# Patient Record
Sex: Male | Born: 1955
Health system: Southern US, Community
[De-identification: ages and names within clinical notes are randomized; demographics above are authoritative.]

## PROBLEM LIST (undated history)

## (undated) DIAGNOSIS — M109 Gout, unspecified: Secondary | ICD-10-CM

## (undated) DIAGNOSIS — G4730 Sleep apnea, unspecified: Secondary | ICD-10-CM

## (undated) DIAGNOSIS — F1011 Alcohol abuse, in remission: Secondary | ICD-10-CM

## (undated) DIAGNOSIS — F419 Anxiety disorder, unspecified: Secondary | ICD-10-CM

## (undated) DIAGNOSIS — M1990 Unspecified osteoarthritis, unspecified site: Secondary | ICD-10-CM

## (undated) DIAGNOSIS — I10 Essential (primary) hypertension: Secondary | ICD-10-CM

## (undated) DIAGNOSIS — N529 Male erectile dysfunction, unspecified: Secondary | ICD-10-CM

## (undated) DIAGNOSIS — K7460 Unspecified cirrhosis of liver: Secondary | ICD-10-CM

## (undated) DIAGNOSIS — E785 Hyperlipidemia, unspecified: Secondary | ICD-10-CM

## (undated) DIAGNOSIS — E119 Type 2 diabetes mellitus without complications: Secondary | ICD-10-CM

## (undated) DIAGNOSIS — F32A Depression, unspecified: Secondary | ICD-10-CM

## (undated) DIAGNOSIS — R112 Nausea with vomiting, unspecified: Secondary | ICD-10-CM

## (undated) DIAGNOSIS — I509 Heart failure, unspecified: Secondary | ICD-10-CM

## (undated) DIAGNOSIS — E781 Pure hyperglyceridemia: Secondary | ICD-10-CM

## (undated) DIAGNOSIS — Z9889 Other specified postprocedural states: Secondary | ICD-10-CM

## (undated) HISTORY — DX: Hyperlipidemia, unspecified: E78.5

## (undated) HISTORY — DX: Unspecified cirrhosis of liver: K74.60

## (undated) HISTORY — DX: Alcohol abuse, in remission: F10.11

## (undated) HISTORY — DX: Essential (primary) hypertension: I10

## (undated) HISTORY — DX: Pure hyperglyceridemia: E78.1

## (undated) HISTORY — PX: ELBOW SURGERY: SHX618

## (undated) HISTORY — DX: Male erectile dysfunction, unspecified: N52.9

## (undated) HISTORY — PX: CARPAL TUNNEL RELEASE: SHX101

## (undated) HISTORY — DX: Type 2 diabetes mellitus without complications: E11.9

## (undated) HISTORY — PX: NECK SURGERY: SHX720

---

## 2008-06-11 HISTORY — PX: COLONOSCOPY: SHX174

## 2008-11-26 ENCOUNTER — Ambulatory Visit: Payer: Self-pay | Admitting: Internal Medicine

## 2008-12-17 ENCOUNTER — Ambulatory Visit: Payer: Self-pay | Admitting: Internal Medicine

## 2011-07-19 ENCOUNTER — Other Ambulatory Visit: Payer: Self-pay | Admitting: Family Medicine

## 2011-07-19 ENCOUNTER — Ambulatory Visit (HOSPITAL_COMMUNITY)
Admission: RE | Admit: 2011-07-19 | Discharge: 2011-07-19 | Disposition: A | Discharge status: Home / Self Care | Payer: BC Managed Care – PPO | Source: Ambulatory Visit | Attending: Family Medicine | Admitting: Family Medicine

## 2011-07-19 DIAGNOSIS — M542 Cervicalgia: Secondary | ICD-10-CM | POA: Insufficient documentation

## 2011-07-19 DIAGNOSIS — M79609 Pain in unspecified limb: Secondary | ICD-10-CM | POA: Insufficient documentation

## 2011-07-19 DIAGNOSIS — R52 Pain, unspecified: Secondary | ICD-10-CM

## 2011-07-19 DIAGNOSIS — M5030 Other cervical disc degeneration, unspecified cervical region: Secondary | ICD-10-CM | POA: Insufficient documentation

## 2011-07-23 ENCOUNTER — Other Ambulatory Visit: Payer: Self-pay | Admitting: Family Medicine

## 2011-07-23 DIAGNOSIS — M542 Cervicalgia: Secondary | ICD-10-CM

## 2011-07-30 ENCOUNTER — Ambulatory Visit (HOSPITAL_COMMUNITY): Payer: BC Managed Care – PPO

## 2011-07-31 ENCOUNTER — Ambulatory Visit (HOSPITAL_COMMUNITY)
Admission: RE | Admit: 2011-07-31 | Discharge: 2011-07-31 | Disposition: A | Discharge status: Home / Self Care | Payer: BC Managed Care – PPO | Source: Ambulatory Visit | Attending: Family Medicine | Admitting: Family Medicine

## 2011-07-31 ENCOUNTER — Other Ambulatory Visit: Payer: Self-pay | Admitting: Family Medicine

## 2011-07-31 DIAGNOSIS — M5020 Other cervical disc displacement, unspecified cervical region: Secondary | ICD-10-CM | POA: Insufficient documentation

## 2011-07-31 DIAGNOSIS — M542 Cervicalgia: Secondary | ICD-10-CM

## 2011-07-31 DIAGNOSIS — M5380 Other specified dorsopathies, site unspecified: Secondary | ICD-10-CM | POA: Insufficient documentation

## 2011-07-31 DIAGNOSIS — R209 Unspecified disturbances of skin sensation: Secondary | ICD-10-CM | POA: Insufficient documentation

## 2011-07-31 MED ORDER — GADOBENATE DIMEGLUMINE 529 MG/ML IV SOLN
18.0000 mL | Freq: Once | INTRAVENOUS | Status: AC | PRN
  Administered 2011-07-31: 18 mL via INTRAVENOUS

## 2011-08-02 LAB — POCT I-STAT, CHEM 8
BUN: 21 mg/dL (ref 6–23)
Calcium, Ion: 1.26 mmol/L (ref 1.12–1.32)
Chloride: 106 mEq/L (ref 96–112)
Creatinine, Ser: 1.1 mg/dL (ref 0.50–1.35)
Glucose, Bld: 137 mg/dL — ABNORMAL HIGH (ref 70–99)
HCT: 39 % (ref 39.0–52.0)
Hemoglobin: 13.3 g/dL (ref 13.0–17.0)
Potassium: 4.2 mEq/L (ref 3.5–5.1)
Sodium: 141 mEq/L (ref 135–145)
TCO2: 28 mmol/L (ref 0–100)

## 2012-09-24 ENCOUNTER — Encounter: Payer: Self-pay | Admitting: *Deleted

## 2012-09-25 ENCOUNTER — Other Ambulatory Visit: Payer: Self-pay | Admitting: Family Medicine

## 2012-09-26 ENCOUNTER — Encounter: Payer: Self-pay | Admitting: Nurse Practitioner

## 2012-09-26 ENCOUNTER — Ambulatory Visit (INDEPENDENT_AMBULATORY_CARE_PROVIDER_SITE_OTHER): Payer: BC Managed Care – PPO | Admitting: Nurse Practitioner

## 2012-09-26 VITALS — BP 118/88 | HR 80 | Ht 68.0 in | Wt 201.3 lb

## 2012-09-26 DIAGNOSIS — I10 Essential (primary) hypertension: Secondary | ICD-10-CM

## 2012-09-26 DIAGNOSIS — E1169 Type 2 diabetes mellitus with other specified complication: Secondary | ICD-10-CM | POA: Insufficient documentation

## 2012-09-26 DIAGNOSIS — E119 Type 2 diabetes mellitus without complications: Secondary | ICD-10-CM

## 2012-09-26 DIAGNOSIS — Z79899 Other long term (current) drug therapy: Secondary | ICD-10-CM

## 2012-09-26 DIAGNOSIS — J310 Chronic rhinitis: Secondary | ICD-10-CM

## 2012-09-26 DIAGNOSIS — E781 Pure hyperglyceridemia: Secondary | ICD-10-CM

## 2012-09-26 DIAGNOSIS — E785 Hyperlipidemia, unspecified: Secondary | ICD-10-CM

## 2012-09-26 MED ORDER — ATORVASTATIN CALCIUM 20 MG PO TABS: 20.0000 mg | ORAL_TABLET | Freq: Every day | ORAL | Status: DC

## 2012-09-26 NOTE — Assessment & Plan Note (Signed)
stop pravastatin. Switch to atorvastatin 20 mg daily. Repeat lipid profile and liver profile in 3 months.

## 2012-09-26 NOTE — Assessment & Plan Note (Signed)
Prescription given for new glucose testing machine and supplies.  Continue monitoring blood sugar and bring log to next visit.

## 2012-09-26 NOTE — Progress Notes (Signed)
Subjective:  Presents for routine followup of his diabetes. Blood sugars are much improved running 119-165 fasting on average at home. Had recent complete physical done at work. Last hemoglobin A1c was around 7. Sugars vary depending on foods that he eats and times that he takes his medications. Has not had his blood pressure pill today. Occasional reflux depending on what he eats no more than 3 days per week. No abdominal pain. Smokes less than one pack per day. Drinks a fifth of liquor per week. No chest pain or shortness of breath. No edema. Overall eating a healthy diet. Active lifestyle. Occasional mild sore throat, no difficulty swallowing. No fever. No cough. Mild head congestion at times. Mainly sneezing and congestion when he works outside.  Objective:   BP 118/88  Pulse 80  Ht 5\' 8"  (1.727 m)  Wt 201 lb 4.8 oz (91.309 kg)  BMI 30.61 kg/m2 NAD. Alert, oriented. TMs minimal clear effusion, no erythema. Pharynx mild erythema, PND noted. Neck supple with mild soft nontender adenopathy. Lungs clear. Heart regular rate rhythm. BP on recheck 118/80. Lower extremities no edema. Lab work dated 08/29/2012 fasting glucose 110; total cholesterol 165, triglycerides 574 and HDL 33.  Assessment/Plan: #1 Other and unspecified hyperlipidemia - Plan: Lipid panel, Lipid panel  Encounter for long-term (current) use of other medications - Plan: Hepatic function panel, Hepatic function panel  Rhinitis  Hypertension  Hyperlipidemia  Diabetes mellitus without complication   Orders Placed This Encounter  Procedures  . Lipid panel  . Hepatic function panel   stop pravastatin. Switch to atorvastatin 20 mg daily. Repeat lipid profile and liver profile in 3 months. Prescription given for new glucose testing machine and supplies.  Continue monitoring blood sugar and bring log to next visit. Recheck in 3 months, sooner if any problems. OTC meds as directed for congestion. Discussed smoking cessation.

## 2012-11-22 ENCOUNTER — Other Ambulatory Visit: Payer: Self-pay | Admitting: Family Medicine

## 2012-11-30 ENCOUNTER — Other Ambulatory Visit: Payer: Self-pay | Admitting: Family Medicine

## 2012-12-18 ENCOUNTER — Telehealth: Payer: Self-pay | Admitting: Family Medicine

## 2012-12-18 MED ORDER — LISINOPRIL 5 MG PO TABS: 5.0000 mg | ORAL_TABLET | Freq: Every day | ORAL | Status: DC

## 2012-12-18 NOTE — Telephone Encounter (Signed)
Patient needs a refill of her lisinopril to [facility] in [place]

## 2012-12-18 NOTE — Telephone Encounter (Signed)
Rx sent electronically to [facility]. Patient notified.

## 2012-12-26 ENCOUNTER — Ambulatory Visit (INDEPENDENT_AMBULATORY_CARE_PROVIDER_SITE_OTHER): Payer: BC Managed Care – PPO | Admitting: Nurse Practitioner

## 2012-12-26 ENCOUNTER — Encounter: Payer: Self-pay | Admitting: Nurse Practitioner

## 2012-12-26 VITALS — BP 122/94 | HR 70 | Wt 201.6 lb

## 2012-12-26 DIAGNOSIS — Z79899 Other long term (current) drug therapy: Secondary | ICD-10-CM

## 2012-12-26 DIAGNOSIS — G4700 Insomnia, unspecified: Secondary | ICD-10-CM

## 2012-12-26 DIAGNOSIS — E119 Type 2 diabetes mellitus without complications: Secondary | ICD-10-CM

## 2012-12-26 DIAGNOSIS — E781 Pure hyperglyceridemia: Secondary | ICD-10-CM

## 2012-12-26 DIAGNOSIS — I10 Essential (primary) hypertension: Secondary | ICD-10-CM

## 2012-12-26 MED ORDER — ALPRAZOLAM 0.5 MG PO TABS: 0.5000 mg | ORAL_TABLET | Freq: Every evening | ORAL | Status: DC | PRN

## 2012-12-26 MED ORDER — ATORVASTATIN CALCIUM 20 MG PO TABS: 20.0000 mg | ORAL_TABLET | Freq: Every day | ORAL | Status: DC

## 2012-12-26 MED ORDER — HYDROCHLOROTHIAZIDE 25 MG PO TABS: ORAL_TABLET | ORAL | Status: DC

## 2012-12-26 MED ORDER — METFORMIN HCL 500 MG PO TABS: ORAL_TABLET | ORAL | Status: DC

## 2012-12-26 MED ORDER — LISINOPRIL 5 MG PO TABS: 5.0000 mg | ORAL_TABLET | Freq: Every day | ORAL | Status: DC

## 2012-12-26 NOTE — Progress Notes (Signed)
Did not go for labs; given new orders 12/26/12

## 2012-12-26 NOTE — Progress Notes (Signed)
Patient did not go for labs.  Given order for labs again on 12/26/12

## 2012-12-27 LAB — LIPID PANEL
Cholesterol: 129 mg/dL (ref 0–200)
HDL: 37 mg/dL — ABNORMAL LOW (ref >39)
LDL Cholesterol: 17 mg/dL (ref 0–99)
Total CHOL/HDL Ratio: 3.5 Ratio
Triglycerides: 374 mg/dL — ABNORMAL HIGH (ref <150)
VLDL: 75 mg/dL — ABNORMAL HIGH (ref 0–40)

## 2012-12-27 LAB — HEPATIC FUNCTION PANEL
ALT: 29 U/L (ref 0–53)
AST: 21 U/L (ref 0–37)
Albumin: 4.4 g/dL (ref 3.5–5.2)
Alkaline Phosphatase: 64 U/L (ref 39–117)
Bilirubin, Direct: 0.1 mg/dL (ref 0.0–0.3)
Indirect Bilirubin: 0.4 mg/dL (ref 0.0–0.9)
Total Bilirubin: 0.5 mg/dL (ref 0.3–1.2)
Total Protein: 6.7 g/dL (ref 6.0–8.3)

## 2012-12-27 LAB — HEMOGLOBIN A1C
Hgb A1c MFr Bld: 6.1 % — ABNORMAL HIGH (ref <5.7)
Mean Plasma Glucose: 128 mg/dL — ABNORMAL HIGH (ref <117)

## 2012-12-29 ENCOUNTER — Encounter: Payer: Self-pay | Admitting: Nurse Practitioner

## 2012-12-29 NOTE — Progress Notes (Signed)
Subjective:  Presents for routine followup. No chest pain/ischemic type pain or shortness of breath. No edema. Patient works out of town for [employer], has trouble sleeping especially in hotels. Occasionally will take a Xanax, several nights a week will drink alcohol. When asked, she drinks, he states "too much". Tries to avoid sugary drinks and simple carbs. Active lifestyle, no regular exercise. No interest in quitting smoking at this time.  Objective:   BP 122/94  Pulse 70  Wt 201 lb 9.6 oz (91.445 kg)  BMI 30.66 kg/m2 NAD. Alert, oriented. Lungs clear. Heart regular rate rhythm. Mild central obesity.  Assessment:Type 2 diabetes mellitus - Plan: Hemoglobin A1c, Hemoglobin A1c  Hypertriglyceridemia - Plan: Lipid panel, Lipid panel  High risk medication use - Plan: Hepatic function panel, Hepatic function panel  Insomnia  Hypertension   Plan: Meds ordered this encounter  Medications  . ALPRAZolam (XANAX) 0.5 MG tablet    Sig: Take 1 tablet (0.5 mg total) by mouth at bedtime as needed for sleep.    Dispense:  30 tablet    Refill:  5    Order Specific Question:  Supervising Provider    Answer:  Mikey Kirschner [[id number]]  . metFORMIN (GLUCOPHAGE) 500 MG tablet    Sig: TAKE 1 TABLET BY MOUTH TWICE DAILY    Dispense:  60 tablet    Refill:  5    Order Specific Question:  Supervising Provider    Answer:  Mikey Kirschner [[id number]]  . lisinopril (PRINIVIL,ZESTRIL) 5 MG tablet    Sig: Take 1 tablet (5 mg total) by mouth daily.    Dispense:  30 tablet    Refill:  5    Order Specific Question:  Supervising Provider    Answer:  Mikey Kirschner [[id number]]  . hydrochlorothiazide (HYDRODIURIL) 25 MG tablet    Sig: TAKE 1 TABLET BY MOUTH EVERY DAY    Dispense:  30 tablet    Refill:  5    Order Specific Question:  Supervising Provider    Answer:  Mikey Kirschner [[id number]]  . atorvastatin (LIPITOR) 20 MG tablet    Sig: Take 1 tablet (20 mg total) by mouth daily.    Dispense:  30  tablet    Refill:  5    Order Specific Question:  Supervising Provider    Answer:  Mikey Kirschner [[id number]]   encouraged patient to use low-dose Xanax as needed for sleep, limit alcohol intake to 2 affect on his blood sugar. Recheck in 4 months, call back sooner if any problems.

## 2013-01-05 ENCOUNTER — Encounter: Payer: Self-pay | Admitting: Nurse Practitioner

## 2013-01-08 ENCOUNTER — Encounter: Payer: Self-pay | Admitting: *Deleted

## 2013-01-08 ENCOUNTER — Other Ambulatory Visit: Payer: Self-pay | Admitting: *Deleted

## 2013-01-08 MED ORDER — ATORVASTATIN CALCIUM 40 MG PO TABS: 40.0000 mg | ORAL_TABLET | Freq: Every day | ORAL | Status: DC

## 2013-04-16 ENCOUNTER — Other Ambulatory Visit: Payer: Self-pay

## 2013-06-30 ENCOUNTER — Telehealth: Payer: Self-pay | Admitting: Family Medicine

## 2013-06-30 NOTE — Telephone Encounter (Signed)
Patient needs order for blood work. °

## 2013-07-01 ENCOUNTER — Other Ambulatory Visit: Payer: Self-pay | Admitting: Nurse Practitioner

## 2013-07-01 DIAGNOSIS — E781 Pure hyperglyceridemia: Secondary | ICD-10-CM

## 2013-07-01 DIAGNOSIS — Z79899 Other long term (current) drug therapy: Secondary | ICD-10-CM

## 2013-07-01 DIAGNOSIS — E119 Type 2 diabetes mellitus without complications: Secondary | ICD-10-CM

## 2013-07-01 DIAGNOSIS — E785 Hyperlipidemia, unspecified: Secondary | ICD-10-CM

## 2013-07-01 DIAGNOSIS — I10 Essential (primary) hypertension: Secondary | ICD-10-CM

## 2013-07-01 DIAGNOSIS — Z125 Encounter for screening for malignant neoplasm of prostate: Secondary | ICD-10-CM

## 2013-07-04 LAB — BASIC METABOLIC PANEL
BUN: 19 mg/dL (ref 6–23)
CO2: 29 mEq/L (ref 19–32)
Calcium: 9 mg/dL (ref 8.4–10.5)
Chloride: 103 mEq/L (ref 96–112)
Creat: 1.3 mg/dL (ref 0.50–1.35)
Glucose, Bld: 122 mg/dL — ABNORMAL HIGH (ref 70–99)
Potassium: 4.3 mEq/L (ref 3.5–5.3)
Sodium: 140 mEq/L (ref 135–145)

## 2013-07-04 LAB — LIPID PANEL
Cholesterol: 125 mg/dL (ref 0–200)
HDL: 44 mg/dL (ref >39)
LDL Cholesterol: 8 mg/dL (ref 0–99)
Total CHOL/HDL Ratio: 2.8 Ratio
Triglycerides: 365 mg/dL — ABNORMAL HIGH (ref <150)
VLDL: 73 mg/dL — ABNORMAL HIGH (ref 0–40)

## 2013-07-04 LAB — HEPATIC FUNCTION PANEL
ALT: 26 U/L (ref 0–53)
AST: 20 U/L (ref 0–37)
Albumin: 4.5 g/dL (ref 3.5–5.2)
Alkaline Phosphatase: 71 U/L (ref 39–117)
Bilirubin, Direct: 0.1 mg/dL (ref 0.0–0.3)
Indirect Bilirubin: 0.4 mg/dL (ref 0.0–0.9)
Total Bilirubin: 0.5 mg/dL (ref 0.3–1.2)
Total Protein: 7.4 g/dL (ref 6.0–8.3)

## 2013-07-04 LAB — HEMOGLOBIN A1C
Hgb A1c MFr Bld: 6.4 % — ABNORMAL HIGH (ref <5.7)
Mean Plasma Glucose: 137 mg/dL — ABNORMAL HIGH (ref <117)

## 2013-07-04 LAB — MICROALBUMIN, URINE: Microalb, Ur: 3.16 mg/dL — ABNORMAL HIGH (ref 0.00–1.89)

## 2013-07-05 LAB — PSA: PSA: 0.49 ng/mL (ref <=4.00)

## 2013-07-13 ENCOUNTER — Encounter: Payer: Self-pay | Admitting: Nurse Practitioner

## 2013-07-13 ENCOUNTER — Ambulatory Visit (INDEPENDENT_AMBULATORY_CARE_PROVIDER_SITE_OTHER): Payer: BC Managed Care – PPO | Admitting: Nurse Practitioner

## 2013-07-13 VITALS — BP 130/86 | Ht 66.5 in | Wt 198.4 lb

## 2013-07-13 DIAGNOSIS — E781 Pure hyperglyceridemia: Secondary | ICD-10-CM

## 2013-07-13 DIAGNOSIS — N529 Male erectile dysfunction, unspecified: Secondary | ICD-10-CM

## 2013-07-13 DIAGNOSIS — E119 Type 2 diabetes mellitus without complications: Secondary | ICD-10-CM

## 2013-07-13 DIAGNOSIS — I10 Essential (primary) hypertension: Secondary | ICD-10-CM

## 2013-07-13 MED ORDER — CYCLOBENZAPRINE HCL 10 MG PO TABS: 10.0000 mg | ORAL_TABLET | Freq: Three times a day (TID) | ORAL | Status: DC | PRN

## 2013-07-13 MED ORDER — HYDROCHLOROTHIAZIDE 25 MG PO TABS: ORAL_TABLET | ORAL | Status: DC

## 2013-07-13 MED ORDER — ALPRAZOLAM 0.5 MG PO TABS: 0.5000 mg | ORAL_TABLET | Freq: Every evening | ORAL | Status: DC | PRN

## 2013-07-13 MED ORDER — TADALAFIL 20 MG PO TABS: 20.0000 mg | ORAL_TABLET | ORAL | Status: DC | PRN

## 2013-07-13 NOTE — Progress Notes (Signed)
Subjective:  Presents for routine followup and to review his most recent blood work. Has not been taking his omega-3 on a regular basis. Has decreased his smoking from one pack per day to one pack per week. Using  vaporizer with minimal nicotine. Requesting a refill on his Cialis. Understands not to take medication with any nitroglycerin products. No chest pain shortness of breath or edema.  Objective:   BP 130/86  Ht 5' 6.5" (1.689 m)  Wt 198 lb 6.4 oz (89.994 kg)  BMI 31.55 kg/m2 NAD. Alert, oriented. Lungs clear. Heart regular rhythm. See lab work dated 1/24. Reviewed with patient. All of his labs rate are stable or improved, although triglycerides are much improved from previous levels, still as above 300.  Assessment:Diabetes mellitus without complication  Hypertriglyceridemia  Hypertension  Erectile dysfunction  Plan: Continue current medications as directed. Restart omega-3 and slowly titrate dose up as much as possible. Repeat lipid profile in 6 months.

## 2013-07-13 NOTE — Assessment & Plan Note (Signed)
Plan: Continue current medications as directed. Restart omega-3 and slowly titrate dose up as much as possible. Repeat lipid profile in 6 months.

## 2013-07-20 ENCOUNTER — Other Ambulatory Visit: Payer: Self-pay | Admitting: Nurse Practitioner

## 2013-08-31 ENCOUNTER — Other Ambulatory Visit: Payer: Self-pay | Admitting: Nurse Practitioner

## 2013-11-21 ENCOUNTER — Other Ambulatory Visit: Payer: Self-pay | Admitting: Family Medicine

## 2014-01-01 ENCOUNTER — Encounter: Payer: Self-pay | Admitting: Family Medicine

## 2014-01-01 ENCOUNTER — Ambulatory Visit (INDEPENDENT_AMBULATORY_CARE_PROVIDER_SITE_OTHER): Payer: BC Managed Care – PPO | Admitting: Family Medicine

## 2014-01-01 VITALS — BP 128/82 | Ht 67.0 in | Wt 203.0 lb

## 2014-01-01 DIAGNOSIS — E119 Type 2 diabetes mellitus without complications: Secondary | ICD-10-CM

## 2014-01-01 DIAGNOSIS — E785 Hyperlipidemia, unspecified: Secondary | ICD-10-CM

## 2014-01-01 DIAGNOSIS — Z125 Encounter for screening for malignant neoplasm of prostate: Secondary | ICD-10-CM

## 2014-01-01 DIAGNOSIS — E781 Pure hyperglyceridemia: Secondary | ICD-10-CM

## 2014-01-01 DIAGNOSIS — I10 Essential (primary) hypertension: Secondary | ICD-10-CM

## 2014-01-01 DIAGNOSIS — M109 Gout, unspecified: Secondary | ICD-10-CM

## 2014-01-01 MED ORDER — COLCHICINE 0.6 MG PO TABS: 0.6000 mg | ORAL_TABLET | Freq: Two times a day (BID) | ORAL | Status: DC

## 2014-01-01 NOTE — Progress Notes (Signed)
   Subjective:    Patient ID: Austin Morgan, male    DOB: May 23, 1956, 58 y.o.   MRN: [id number]  HPI  Patient arrives with flare of gout. Swollen right pointer finger and left big toe. Swelling and pain started last weekend. Has had this before. Eats a lot of red knee. I that in no and you do Review of Systems He relates pain discomfort denies any fever chills or sweats.    Objective:   Physical Exam  Patient has tenderness in the left into the joint. He also has tophi noted in the right index finger at the DIP joint. Lungs clear heart regular      Assessment & Plan:  Gout-hold off on statins for the next 10 days while on colchicine twice a day  Down the road we will talk about starting allopurinol or Uloric, he is coming back in August after doing his blood  Check lab work. May end up having to be on uric acid.

## 2014-01-04 ENCOUNTER — Other Ambulatory Visit: Payer: Self-pay | Admitting: Nurse Practitioner

## 2014-01-12 LAB — MICROALBUMIN, URINE: Microalb, Ur: 1.25 mg/dL (ref 0.00–1.89)

## 2014-01-12 LAB — BASIC METABOLIC PANEL
BUN: 22 mg/dL (ref 6–23)
CO2: 27 mEq/L (ref 19–32)
Calcium: 9 mg/dL (ref 8.4–10.5)
Chloride: 99 mEq/L (ref 96–112)
Creat: 1.35 mg/dL (ref 0.50–1.35)
Glucose, Bld: 144 mg/dL — ABNORMAL HIGH (ref 70–99)
Potassium: 4.2 mEq/L (ref 3.5–5.3)
Sodium: 138 mEq/L (ref 135–145)

## 2014-01-12 LAB — URIC ACID: Uric Acid, Serum: 10 mg/dL — ABNORMAL HIGH (ref 4.0–7.8)

## 2014-01-12 LAB — HEPATIC FUNCTION PANEL
ALT: 23 U/L (ref 0–53)
AST: 20 U/L (ref 0–37)
Albumin: 4.2 g/dL (ref 3.5–5.2)
Alkaline Phosphatase: 69 U/L (ref 39–117)
Bilirubin, Direct: 0.1 mg/dL (ref 0.0–0.3)
Indirect Bilirubin: 0.4 mg/dL (ref 0.2–1.2)
Total Bilirubin: 0.5 mg/dL (ref 0.2–1.2)
Total Protein: 6.6 g/dL (ref 6.0–8.3)

## 2014-01-12 LAB — HEMOGLOBIN A1C
Hgb A1c MFr Bld: 7 % — ABNORMAL HIGH (ref <5.7)
Mean Plasma Glucose: 154 mg/dL — ABNORMAL HIGH (ref <117)

## 2014-01-12 LAB — LIPID PANEL
Cholesterol: 222 mg/dL — ABNORMAL HIGH (ref 0–200)
HDL: 30 mg/dL — ABNORMAL LOW (ref >39)
Total CHOL/HDL Ratio: 7.4 Ratio
Triglycerides: 1413 mg/dL — ABNORMAL HIGH (ref <150)

## 2014-01-12 LAB — PSA: PSA: 0.87 ng/mL (ref <=4.00)

## 2014-01-13 ENCOUNTER — Other Ambulatory Visit: Payer: Self-pay | Admitting: Family Medicine

## 2014-01-15 ENCOUNTER — Ambulatory Visit (INDEPENDENT_AMBULATORY_CARE_PROVIDER_SITE_OTHER): Payer: BC Managed Care – PPO | Admitting: Nurse Practitioner

## 2014-01-15 ENCOUNTER — Encounter: Payer: Self-pay | Admitting: Nurse Practitioner

## 2014-01-15 VITALS — BP 136/92 | Ht 67.0 in | Wt 200.0 lb

## 2014-01-15 DIAGNOSIS — Z79899 Other long term (current) drug therapy: Secondary | ICD-10-CM

## 2014-01-15 DIAGNOSIS — M10041 Idiopathic gout, right hand: Secondary | ICD-10-CM

## 2014-01-15 DIAGNOSIS — E781 Pure hyperglyceridemia: Secondary | ICD-10-CM

## 2014-01-15 DIAGNOSIS — M109 Gout, unspecified: Secondary | ICD-10-CM

## 2014-01-15 MED ORDER — ALPRAZOLAM 0.5 MG PO TABS: 0.5000 mg | ORAL_TABLET | Freq: Every evening | ORAL | Status: DC | PRN

## 2014-01-15 MED ORDER — FENOFIBRATE 160 MG PO TABS: 160.0000 mg | ORAL_TABLET | Freq: Every day | ORAL | Status: DC

## 2014-01-15 MED ORDER — ALLOPURINOL 300 MG PO TABS: 300.0000 mg | ORAL_TABLET | Freq: Every day | ORAL | Status: DC

## 2014-01-15 NOTE — Patient Instructions (Addendum)
Hold on Lipitor  colchicine twice a day with allopurinol 2 weeks Stop colchicine and continue allopurinol Repeat labs (kidney function and uric acid) in 4 weeks If ok, then restart lipitor; add fenofibrate after stopping colchicine Recheck cholesterol and liver in 8-10 weeks  Low-Purine Diet Purines are compounds that affect the level of uric acid in your body. A low-purine diet is a diet that is low in purines. Eating a low-purine diet can prevent the level of uric acid in your body from getting too high and causing gout or kidney stones or both. WHAT DO I NEED TO KNOW ABOUT THIS DIET?  Choose low-purine foods. Examples of low-purine foods are listed in the next section.  Drink plenty of fluids, especially water. Fluids can help remove uric acid from your body. Try to drink 8-16 cups (1.9-3.8 L) a day.  Limit foods high in fat, especially saturated fat, as fat makes it harder for the body to get rid of uric acid. Foods high in saturated fat include pizza, cheese, ice cream, whole milk, fried foods, and gravies. Choose foods that are lower in fat and lean sources of protein. Use olive oil when cooking as it contains healthy fats that are not high in saturated fat.  Limit alcohol. Alcohol interferes with the elimination of uric acid from your body. If you are having a gout attack, avoid all alcohol.  Keep in mind that different people's bodies react differently to different foods. You will probably learn over time which foods do or do not affect you. If you discover that a food tends to cause your gout to flare up, avoid eating that food. You can more freely enjoy foods that do not cause problems. If you have any questions about a food item, talk to your dietitian or health care provider. WHICH FOODS ARE LOW, MODERATE, AND HIGH IN PURINES? The following is a list of foods that are low, moderate, and high in purines. You can eat any amount of the foods that are low in purines. You may be able to  have small amounts of foods that are moderate in purines. Ask your health care provider how much of a food moderate in purines you can have. Avoid foods high in purines. Grains  Foods low in purines: Enriched white bread, pasta, rice, cake, cornbread, popcorn.  Foods moderate in purines: Whole-grain breads and cereals, wheat germ, bran, oatmeal. Uncooked oatmeal. Dry wheat bran or wheat germ.  Foods high in purines: Pancakes, [place] toast, biscuits, muffins. Vegetables  Foods low in purines: All vegetables, except those that are moderate in purines.  Foods moderate in purines: Asparagus, cauliflower, spinach, mushrooms, green peas. Fruits  All fruits are low in purines. Meats and other Protein Foods  Foods low in purines: Eggs, nuts, peanut butter.  Foods moderate in purines: 80-90% lean beef, lamb, veal, pork, poultry, fish, eggs, peanut butter, nuts. Crab, lobster, oysters, and shrimp. Cooked dried beans, peas, and lentils.  Foods high in purines: Anchovies, sardines, herring, mussels, tuna, codfish, scallops, trout, and haddock. Berniece Salines. Organ meats (such as liver or kidney). Tripe. Game meat. Goose. Sweetbreads. Dairy  All dairy foods are low in purines. Low-fat and fat-free dairy products are best because they are low in saturated fat. Beverages  Drinks low in purines: Water, carbonated beverages, tea, coffee, cocoa.  Drinks moderate in purines: Soft drinks and other drinks sweetened with high-fructose corn syrup. Juices. To find whether a food or drink is sweetened with high-fructose corn syrup, look at the  ingredients list.  Drinks high in purines: Alcoholic beverages (such as beer). Condiments  Foods low in purines: Salt, herbs, olives, pickles, relishes, vinegar.  Foods moderate in purines: Butter, margarine, oils, mayonnaise. Fats and Oils  Foods low in purines: All types, except gravies and sauces made with meat.  Foods high in purines: Gravies and sauces made with  meat. Other Foods  Foods low in purines: Sugars, sweets, gelatin. Cake. Soups made without meat.  Foods moderate in purines: Meat-based or fish-based soups, broths, or bouillons. Foods and drinks sweetened with high-fructose corn syrup.  Foods high in purines: High-fat desserts (such as ice cream, cookies, cakes, pies, doughnuts, and chocolate). Contact your dietitian for more information on foods that are not listed here. Document Released: 09/22/2010 Document Revised: 06/02/2013 Document Reviewed: 05/04/2013 [facility] Patient Information 2015 [place], [place]. This information is not intended to replace advice given to you by your health care provider. Make sure you discuss any questions you have with your health care provider.

## 2014-01-19 ENCOUNTER — Encounter: Payer: Self-pay | Admitting: Nurse Practitioner

## 2014-01-19 DIAGNOSIS — M109 Gout, unspecified: Secondary | ICD-10-CM | POA: Insufficient documentation

## 2014-01-19 MED ORDER — SILDENAFIL CITRATE 50 MG PO TABS: 50.0000 mg | ORAL_TABLET | Freq: Every day | ORAL | Status: DC | PRN

## 2014-01-19 NOTE — Progress Notes (Signed)
Subjective:  Presents to discuss recent labs. Continues to have significant gout in his right ring finger. Nontender. Gout in his toe has resolved. Asked his wife to drain area; yellow stringy material, no purulent material. No fever. Cut out his alcohol for a few weeks and increased activity, but resumed alcohol when he developed gout in finger. Still on colchicine. Off lipitor while on this med. No CP/ischemic type pain or SOB. No abd pain. No nausea, vomiting.  Objective:   BP 136/92  Ht 5\' 7"  (1.702 m)  Wt 200 lb (90.719 kg)  BMI 31.32 kg/m2 NAD. Alert, oriented. Lungs clear. Heart RRR. abd soft, non distended, non tender. Significant edema and erythema noted at distal joint right ring finger, minimal tenderness to palpation. See labs 8/3. TG 1413 (365 last visit); hgbA1C 7.0 (6.4 last time). Reviewed labs with patient.   Assessment:  Problem List Items Addressed This Visit     Other   Hypertriglyceridemia - Primary (Chronic)   Relevant Medications      fenofibrate tablet   Other Relevant Orders      Basic metabolic panel      Lipid panel    Other Visit Diagnoses   Idiopathic gout of right hand, unspecified chronicity        Relevant Orders       Uric acid    Encounter for long-term (current) use of other medications        Relevant Orders       Hepatic function panel      Consulted with Dr. Nicki Reaper. Given written and verbal instructions.  Hold on Lipitor  colchicine twice a day with allopurinol 2 weeks Stop colchicine and continue allopurinol Repeat labs (kidney function and uric acid) in 4 weeks If ok, then restart lipitor; add fenofibrate after stopping colchicine Recheck cholesterol and liver in 8-10 weeks Given copy of low purine diet.  Return in about 3 months (around 04/17/2014). Call sooner if any problems.  At end of visit, patient requested to switch to Viagra. Cialis was too expensive,

## 2014-01-21 ENCOUNTER — Other Ambulatory Visit: Payer: Self-pay | Admitting: Family Medicine

## 2014-02-12 ENCOUNTER — Telehealth: Payer: Self-pay | Admitting: Family Medicine

## 2014-02-12 DIAGNOSIS — IMO0001 Reserved for inherently not codable concepts without codable children: Secondary | ICD-10-CM

## 2014-02-12 NOTE — Telephone Encounter (Signed)
Please add a CK for myalgias just to be safe

## 2014-02-12 NOTE — Telephone Encounter (Signed)
Ever since patient started taking fenofibrate in addition to his atorvastatin, he has been having muscle aches in his legs. The pain is not terrible, but present. Austin Morgan wants to know if Austin Morgan may want to add anything on his lab work that he is having done tomorrow to check for anything regarding his muscles.

## 2014-02-12 NOTE — Telephone Encounter (Signed)
Blood work order placed in [facility]. Patient notified.

## 2014-02-28 ENCOUNTER — Other Ambulatory Visit: Payer: Self-pay | Admitting: Family Medicine

## 2014-03-01 LAB — CK: Total CK: 51 U/L (ref 7–232)

## 2014-03-06 ENCOUNTER — Other Ambulatory Visit: Payer: Self-pay | Admitting: Family Medicine

## 2014-04-05 ENCOUNTER — Other Ambulatory Visit: Payer: Self-pay | Admitting: *Deleted

## 2014-04-05 ENCOUNTER — Other Ambulatory Visit: Payer: Self-pay | Admitting: Family Medicine

## 2014-04-05 ENCOUNTER — Telehealth: Payer: Self-pay | Admitting: Family Medicine

## 2014-04-05 MED ORDER — LISINOPRIL 5 MG PO TABS: ORAL_TABLET | ORAL | Status: DC

## 2014-04-05 MED ORDER — METFORMIN HCL 500 MG PO TABS: ORAL_TABLET | ORAL | Status: DC

## 2014-04-05 MED ORDER — HYDROCHLOROTHIAZIDE 25 MG PO TABS: ORAL_TABLET | ORAL | Status: DC

## 2014-04-05 NOTE — Telephone Encounter (Signed)
meds sent to pharm. Pt notified.  

## 2014-04-05 NOTE — Telephone Encounter (Signed)
Patient needs refills on metformin 500mg ,lisinopril 5mg , hydrochlorothiazide 25mg  called into [facility] [place] and states he needs them by 12 noon leaving to go out of town for a month.

## 2014-04-29 ENCOUNTER — Other Ambulatory Visit: Payer: Self-pay | Admitting: Nurse Practitioner

## 2014-04-30 ENCOUNTER — Encounter: Payer: Self-pay | Admitting: Nurse Practitioner

## 2014-04-30 ENCOUNTER — Ambulatory Visit (INDEPENDENT_AMBULATORY_CARE_PROVIDER_SITE_OTHER): Payer: BC Managed Care – PPO | Admitting: Nurse Practitioner

## 2014-04-30 VITALS — BP 130/84 | Ht 67.0 in | Wt 205.5 lb

## 2014-04-30 DIAGNOSIS — M1A00X1 Idiopathic chronic gout, unspecified site, with tophus (tophi): Secondary | ICD-10-CM

## 2014-04-30 MED ORDER — ALLOPURINOL 300 MG PO TABS: 300.0000 mg | ORAL_TABLET | Freq: Two times a day (BID) | ORAL | Status: DC

## 2014-05-05 ENCOUNTER — Encounter: Payer: Self-pay | Admitting: Nurse Practitioner

## 2014-05-05 NOTE — Progress Notes (Signed)
Subjective:  Presents for follow up of gout. Taking colchicine about every 3 weeks due to gout flare up. Stops his statin during that time due to potential interactions. Had a flare up recently podagra left foot which has resolved. Area on right finger much better.   Objective:   BP 130/84 mmHg  Ht 5\' 7"  (1.702 m)  Wt 205 lb 8 oz (93.214 kg)  BMI 32.18 kg/m2 NAD. Alert, oriented. Edema on right finger much improved. Lungs clear. Heart RRR.   Assessment: Idiopathic chronic gout with tophus, unspecified site  Plan:  Meds ordered this encounter  Medications  . allopurinol (ZYLOPRIM) 300 MG tablet    Sig: Take 1 tablet (300 mg total) by mouth 2 (two) times daily.    Dispense:  60 tablet    Refill:  5    Order Specific Question:  Supervising Provider    Answer:  Mikey Kirschner [[id number]]   Increase allopurinol to BID. Our goal is to keep him off of colchicine as much as possible so he can take statin. Obtain labs as previously recommended about a week before physical. Return in about 2 months (around 06/30/2014) for physical. Call back sooner if problems.

## 2014-06-14 ENCOUNTER — Telehealth: Payer: Self-pay | Admitting: Family Medicine

## 2014-06-14 DIAGNOSIS — I10 Essential (primary) hypertension: Secondary | ICD-10-CM

## 2014-06-14 DIAGNOSIS — M109 Gout, unspecified: Secondary | ICD-10-CM

## 2014-06-14 DIAGNOSIS — R5383 Other fatigue: Secondary | ICD-10-CM

## 2014-06-14 DIAGNOSIS — Z79899 Other long term (current) drug therapy: Secondary | ICD-10-CM

## 2014-06-14 DIAGNOSIS — E781 Pure hyperglyceridemia: Secondary | ICD-10-CM

## 2014-06-14 DIAGNOSIS — E119 Type 2 diabetes mellitus without complications: Secondary | ICD-10-CM

## 2014-06-14 NOTE — Telephone Encounter (Signed)
Pt is requesting labs for his physical on 06/21/2014. Last labs were: PSA 8/3                           A1C 8/3                           MicroAlbumin 8/3                           CK 9/21                           Lipid Panel 8/3                           Hepatic Function 8/7                           Uric Acid 8/7                           BMP 8/7

## 2014-06-14 NOTE — Telephone Encounter (Signed)
Lipid, liver, hemoglobin [id number], metabolic 7, CBC, uric acid. PSA is not indicated on this particular visit.

## 2014-06-15 NOTE — Telephone Encounter (Signed)
Patient notified and verbalized understanding. 

## 2014-06-19 LAB — CBC WITH DIFFERENTIAL/PLATELET
Basophils Absolute: 0 10*3/uL (ref 0.0–0.1)
Basophils Relative: 0 % (ref 0–1)
Eosinophils Absolute: 0.3 10*3/uL (ref 0.0–0.7)
Eosinophils Relative: 5 % (ref 0–5)
HCT: 38.4 % — ABNORMAL LOW (ref 39.0–52.0)
Hemoglobin: 12.9 g/dL — ABNORMAL LOW (ref 13.0–17.0)
Lymphocytes Relative: 23 % (ref 12–46)
Lymphs Abs: 1.4 10*3/uL (ref 0.7–4.0)
MCH: 32.3 pg (ref 26.0–34.0)
MCHC: 33.6 g/dL (ref 30.0–36.0)
MCV: 96.2 fL (ref 78.0–100.0)
MPV: 9.9 fL (ref 8.6–12.4)
Monocytes Absolute: 0.7 10*3/uL (ref 0.1–1.0)
Monocytes Relative: 11 % (ref 3–12)
Neutro Abs: 3.8 10*3/uL (ref 1.7–7.7)
Neutrophils Relative %: 61 % (ref 43–77)
Platelets: 224 10*3/uL (ref 150–400)
RBC: 3.99 MIL/uL — ABNORMAL LOW (ref 4.22–5.81)
RDW: 13.8 % (ref 11.5–15.5)
WBC: 6.2 10*3/uL (ref 4.0–10.5)

## 2014-06-20 LAB — HEPATIC FUNCTION PANEL
ALT: 31 U/L (ref 0–53)
AST: 18 U/L (ref 0–37)
Albumin: 4.1 g/dL (ref 3.5–5.2)
Alkaline Phosphatase: 60 U/L (ref 39–117)
Bilirubin, Direct: 0.1 mg/dL (ref 0.0–0.3)
Indirect Bilirubin: 0.6 mg/dL (ref 0.2–1.2)
Total Bilirubin: 0.7 mg/dL (ref 0.2–1.2)
Total Protein: 6.7 g/dL (ref 6.0–8.3)

## 2014-06-20 LAB — BASIC METABOLIC PANEL
BUN: 17 mg/dL (ref 6–23)
CO2: 28 mEq/L (ref 19–32)
Calcium: 9 mg/dL (ref 8.4–10.5)
Chloride: 100 mEq/L (ref 96–112)
Creat: 1.4 mg/dL — ABNORMAL HIGH (ref 0.50–1.35)
Glucose, Bld: 188 mg/dL — ABNORMAL HIGH (ref 70–99)
Potassium: 3.7 mEq/L (ref 3.5–5.3)
Sodium: 137 mEq/L (ref 135–145)

## 2014-06-20 LAB — LIPID PANEL
Cholesterol: 125 mg/dL (ref 0–200)
HDL: 31 mg/dL — ABNORMAL LOW
Total CHOL/HDL Ratio: 4 ratio
Triglycerides: 519 mg/dL — ABNORMAL HIGH

## 2014-06-20 LAB — HEMOGLOBIN A1C
Hgb A1c MFr Bld: 7.9 % — ABNORMAL HIGH
Mean Plasma Glucose: 180 mg/dL — ABNORMAL HIGH

## 2014-06-20 LAB — URIC ACID: Uric Acid, Serum: 3.3 mg/dL — ABNORMAL LOW (ref 4.0–7.8)

## 2014-06-21 ENCOUNTER — Telehealth: Payer: Self-pay | Admitting: *Deleted

## 2014-06-21 ENCOUNTER — Ambulatory Visit (INDEPENDENT_AMBULATORY_CARE_PROVIDER_SITE_OTHER): Payer: BLUE CROSS/BLUE SHIELD | Admitting: Family Medicine

## 2014-06-21 ENCOUNTER — Encounter: Payer: Self-pay | Admitting: Family Medicine

## 2014-06-21 VITALS — BP 128/82 | HR 88 | Ht 67.0 in | Wt 205.0 lb

## 2014-06-21 DIAGNOSIS — E119 Type 2 diabetes mellitus without complications: Secondary | ICD-10-CM

## 2014-06-21 DIAGNOSIS — Z23 Encounter for immunization: Secondary | ICD-10-CM

## 2014-06-21 DIAGNOSIS — E785 Hyperlipidemia, unspecified: Secondary | ICD-10-CM

## 2014-06-21 DIAGNOSIS — E784 Other hyperlipidemia: Secondary | ICD-10-CM

## 2014-06-21 DIAGNOSIS — E781 Pure hyperglyceridemia: Secondary | ICD-10-CM

## 2014-06-21 DIAGNOSIS — I10 Essential (primary) hypertension: Secondary | ICD-10-CM

## 2014-06-21 DIAGNOSIS — Z0000 Encounter for general adult medical examination without abnormal findings: Secondary | ICD-10-CM

## 2014-06-21 DIAGNOSIS — M109 Gout, unspecified: Secondary | ICD-10-CM

## 2014-06-21 DIAGNOSIS — D6489 Other specified anemias: Secondary | ICD-10-CM

## 2014-06-21 MED ORDER — ATORVASTATIN CALCIUM 40 MG PO TABS: 40.0000 mg | ORAL_TABLET | Freq: Every day | ORAL | Status: DC

## 2014-06-21 MED ORDER — COLCHICINE 0.6 MG PO TABS: 0.6000 mg | ORAL_TABLET | Freq: Two times a day (BID) | ORAL | Status: DC

## 2014-06-21 MED ORDER — GLIPIZIDE 5 MG PO TABS: 2.5000 mg | ORAL_TABLET | Freq: Two times a day (BID) | ORAL | Status: DC

## 2014-06-21 NOTE — Patient Instructions (Signed)
Smoking Cessation Quitting smoking is important to your health and has many advantages. However, it is not always easy to quit since nicotine is a very addictive drug. Oftentimes, people try 3 times or more before being able to quit. This document explains the best ways for you to prepare to quit smoking. Quitting takes hard work and a lot of effort, but you can do it. ADVANTAGES OF QUITTING SMOKING  You will live longer, feel better, and live better.  Your body will feel the impact of quitting smoking almost immediately.  Within 20 minutes, blood pressure decreases. Your pulse returns to its normal level.  After 8 hours, carbon monoxide levels in the blood return to normal. Your oxygen level increases.  After 24 hours, the chance of having a heart attack starts to decrease. Your breath, hair, and body stop smelling like smoke.  After 48 hours, damaged nerve endings begin to recover. Your sense of taste and smell improve.  After 72 hours, the body is virtually free of nicotine. Your bronchial tubes relax and breathing becomes easier.  After 2 to 12 weeks, lungs can hold more air. Exercise becomes easier and circulation improves.  The risk of having a heart attack, stroke, cancer, or lung disease is greatly reduced.  After 1 year, the risk of coronary heart disease is cut in half.  After 5 years, the risk of stroke falls to the same as a nonsmoker.  After 10 years, the risk of lung cancer is cut in half and the risk of other cancers decreases significantly.  After 15 years, the risk of coronary heart disease drops, usually to the level of a nonsmoker.  If you are pregnant, quitting smoking will improve your chances of having a healthy baby.  The people you live with, especially any children, will be healthier.  You will have extra money to spend on things other than cigarettes. QUESTIONS TO THINK ABOUT BEFORE ATTEMPTING TO QUIT You may want to talk about your answers with your  health care provider.  Why do you want to quit?  If you tried to quit in the past, what helped and what did not?  What will be the most difficult situations for you after you quit? How will you plan to handle them?  Who can help you through the tough times? Your family? Friends? A health care provider?  What pleasures do you get from smoking? What ways can you still get pleasure if you quit? Here are some questions to ask your health care provider:  How can you help me to be successful at quitting?  What medicine do you think would be best for me and how should I take it?  What should I do if I need more help?  What is smoking withdrawal like? How can I get information on withdrawal? GET READY  Set a quit date.  Change your environment by getting rid of all cigarettes, ashtrays, matches, and lighters in your home, car, or work. Do not let people smoke in your home.  Review your past attempts to quit. Think about what worked and what did not. GET SUPPORT AND ENCOURAGEMENT You have a better chance of being successful if you have help. You can get support in many ways.  Tell your family, friends, and coworkers that you are going to quit and need their support. Ask them not to smoke around you.  Get individual, group, or telephone counseling and support. Programs are available at local hospitals and health centers. Call   your local health department for information about programs in your area.  Spiritual beliefs and practices may help some smokers quit.  Download a "quit meter" on your computer to keep track of quit statistics, such as how long you have gone without smoking, cigarettes not smoked, and money saved.  Get a self-help book about quitting smoking and staying off tobacco. LEARN NEW SKILLS AND BEHAVIORS  Distract yourself from urges to smoke. Talk to someone, go for a walk, or occupy your time with a task.  Change your normal routine. Take a different route to work.  Drink tea instead of coffee. Eat breakfast in a different place.  Reduce your stress. Take a hot bath, exercise, or read a book.  Plan something enjoyable to do every day. Reward yourself for not smoking.  Explore interactive web-based programs that specialize in helping you quit. GET MEDICINE AND USE IT CORRECTLY Medicines can help you stop smoking and decrease the urge to smoke. Combining medicine with the above behavioral methods and support can greatly increase your chances of successfully quitting smoking.  Nicotine replacement therapy helps deliver nicotine to your body without the negative effects and risks of smoking. Nicotine replacement therapy includes nicotine gum, lozenges, inhalers, nasal sprays, and skin patches. Some may be available over-the-counter and others require a prescription.  Antidepressant medicine helps people abstain from smoking, but how this works is unknown. This medicine is available by prescription.  Nicotinic receptor partial agonist medicine simulates the effect of nicotine in your brain. This medicine is available by prescription. Ask your health care provider for advice about which medicines to use and how to use them based on your health history. Your health care provider will tell you what side effects to look out for if you choose to be on a medicine or therapy. Carefully read the information on the package. Do not use any other product containing nicotine while using a nicotine replacement product.  RELAPSE OR DIFFICULT SITUATIONS Most relapses occur within the first 3 months after quitting. Do not be discouraged if you start smoking again. Remember, most people try several times before finally quitting. You may have symptoms of withdrawal because your body is used to nicotine. You may crave cigarettes, be irritable, feel very hungry, cough often, get headaches, or have difficulty concentrating. The withdrawal symptoms are only temporary. They are strongest  when you first quit, but they will go away within 10-14 days. To reduce the chances of relapse, try to:  Avoid drinking alcohol. Drinking lowers your chances of successfully quitting.  Reduce the amount of caffeine you consume. Once you quit smoking, the amount of caffeine in your body increases and can give you symptoms, such as a rapid heartbeat, sweating, and anxiety.  Avoid smokers because they can make you want to smoke.  Do not let weight gain distract you. Many smokers will gain weight when they quit, usually less than 10 pounds. Eat a healthy diet and stay active. You can always lose the weight gained after you quit.  Find ways to improve your mood other than smoking. FOR MORE INFORMATION  www.smokefree.gov  Document Released: 05/22/2001 Document Revised: 10/12/2013 Document Reviewed: 09/06/2011 [employer] Patient Information 2015 [employer]. This information is not intended to replace advice given to you by your health care provider. Make sure you discuss any questions you have with your health care provider.  

## 2014-06-21 NOTE — Telephone Encounter (Signed)
Patient notified and verbalized understanding. Mailed out the cards.

## 2014-06-21 NOTE — Progress Notes (Signed)
   Subjective:    Patient ID: Austin Morgan, male    DOB: [birth year], 59 y.o.   MRN: [id number]  HPI The patient comes in today for a wellness visit.  A review of their health history was completed.   A review of medications was also completed.  Any needed refills: Yes, would like refills on his meds for 6 months.   Eating habits: Health conscious  Falls/  MVA accidents in past few months: No  Regular exercise: Physically active at work  Specialist pt sees on regular basis: No  Preventative health issues were discussed.   Additional concerns: Smoker  Talked at length about his cholesterol issues he is taking medication he understands he needs to keep this under good control to reduce risk of heart disease Talked about smoking the importance of quitting smoking to help lessen the risk of heart attacks and strokes and the importance of taking 81 mg aspirin Talked at length about diabetes cutting back on starches watching portions try to bring weight down Talked about gout medications as well as watching diet. Dietary information was printed and given to the patient  Review of Systems  Constitutional: Negative for activity change, appetite change and fatigue.  HENT: Negative for congestion.   Respiratory: Negative for cough.   Cardiovascular: Negative for chest pain.  Gastrointestinal: Negative for abdominal pain.  Endocrine: Negative for polydipsia and polyphagia.  Neurological: Negative for weakness.  Psychiatric/Behavioral: Negative for confusion.       Objective:   Physical Exam  Constitutional: He appears well-developed and well-nourished.  HENT:  Head: Normocephalic and atraumatic.  Right Ear: External ear normal.  Left Ear: External ear normal.  Nose: Nose normal.  Mouth/Throat: Oropharynx is clear and moist.  Eyes: EOM are normal. Pupils are equal, round, and reactive to light.  Neck: Normal range of motion. Neck supple. No thyromegaly present.  Cardiovascular:  Normal rate, regular rhythm and normal heart sounds.   No murmur heard. Pulmonary/Chest: Effort normal and breath sounds normal. No respiratory distress. He has no wheezes.  Abdominal: Soft. Bowel sounds are normal. He exhibits no distension and no mass. There is no tenderness.  Genitourinary: Penis normal.  Musculoskeletal: Normal range of motion. He exhibits no edema.  Lymphadenopathy:    He has no cervical adenopathy.  Neurological: He is alert. He exhibits normal muscle tone.  Skin: Skin is warm and dry. No erythema.  Psychiatric: He has a normal mood and affect. His behavior is normal. Judgment normal.          Assessment & Plan:  #1 wellness-safety measures dietary measures discussed. Importance of watching diet losing some weight discussed importance of quitting smoking was discussed patient not committed to quitting currently. Patient also encouraged to get pneumonia vaccine. He will get shingles vaccine by age 2. He is up-to-date on his colonoscopy.  #2 hypertriglyceridemia continue medication there is some improvement but I feel he can get his diabetes room better this actually would do better as well  #3 HTN blood pressure overall good watch diet try to quit smoking stay away from salt  #4 diabetes subpar control add glipizide 5 mg half in the morning half at suppertime. If low sugar spells notifies do not skip meals goal is to see A1c improved dietary measures discussed at length continue other medication  #5 it was necessary to cover wellness and sickness visit that the same visit therefore 2 separate codes

## 2014-06-21 NOTE — Telephone Encounter (Signed)
-----   Message from Kathyrn Drown, MD sent at 06/21/2014 12:45 PM EST ----- Please issue the patient Hemoccult cards 3 I forgot to do that for his anemia. The patient may pick these up and do them over the course of the next few weeks. Thank you

## 2014-07-06 ENCOUNTER — Other Ambulatory Visit: Payer: Self-pay

## 2014-07-06 MED ORDER — METFORMIN HCL 500 MG PO TABS: ORAL_TABLET | ORAL | Status: DC

## 2014-07-16 ENCOUNTER — Other Ambulatory Visit: Payer: Self-pay | Admitting: Family Medicine

## 2014-08-04 ENCOUNTER — Other Ambulatory Visit: Payer: Self-pay | Admitting: *Deleted

## 2014-08-04 MED ORDER — CYCLOBENZAPRINE HCL 10 MG PO TABS: 10.0000 mg | ORAL_TABLET | Freq: Three times a day (TID) | ORAL | Status: DC | PRN

## 2014-08-06 ENCOUNTER — Other Ambulatory Visit: Payer: Self-pay | Admitting: Family Medicine

## 2014-09-03 ENCOUNTER — Other Ambulatory Visit: Payer: Self-pay | Admitting: Family Medicine

## 2014-09-17 ENCOUNTER — Ambulatory Visit (INDEPENDENT_AMBULATORY_CARE_PROVIDER_SITE_OTHER): Payer: BLUE CROSS/BLUE SHIELD | Admitting: Family Medicine

## 2014-09-17 ENCOUNTER — Encounter: Payer: Self-pay | Admitting: Family Medicine

## 2014-09-17 VITALS — BP 118/82 | Ht 67.0 in | Wt 193.0 lb

## 2014-09-17 DIAGNOSIS — M109 Gout, unspecified: Secondary | ICD-10-CM | POA: Diagnosis not present

## 2014-09-17 DIAGNOSIS — E781 Pure hyperglyceridemia: Secondary | ICD-10-CM | POA: Diagnosis not present

## 2014-09-17 DIAGNOSIS — Z79899 Other long term (current) drug therapy: Secondary | ICD-10-CM

## 2014-09-17 DIAGNOSIS — E119 Type 2 diabetes mellitus without complications: Secondary | ICD-10-CM

## 2014-09-17 DIAGNOSIS — I10 Essential (primary) hypertension: Secondary | ICD-10-CM | POA: Diagnosis not present

## 2014-09-17 LAB — POCT GLYCOSYLATED HEMOGLOBIN (HGB A1C): Hemoglobin A1C: 5.5

## 2014-09-17 MED ORDER — ALLOPURINOL 300 MG PO TABS: 300.0000 mg | ORAL_TABLET | Freq: Two times a day (BID) | ORAL | Status: DC

## 2014-09-17 MED ORDER — HYDROCHLOROTHIAZIDE 25 MG PO TABS: 25.0000 mg | ORAL_TABLET | Freq: Every day | ORAL | Status: DC

## 2014-09-17 MED ORDER — LISINOPRIL 5 MG PO TABS: 5.0000 mg | ORAL_TABLET | Freq: Every day | ORAL | Status: DC

## 2014-09-17 MED ORDER — ATORVASTATIN CALCIUM 40 MG PO TABS: 40.0000 mg | ORAL_TABLET | Freq: Every day | ORAL | Status: DC

## 2014-09-17 MED ORDER — METFORMIN HCL 500 MG PO TABS: ORAL_TABLET | ORAL | Status: DC

## 2014-09-17 MED ORDER — COLCHICINE 0.6 MG PO TABS: 0.6000 mg | ORAL_TABLET | Freq: Two times a day (BID) | ORAL | Status: DC

## 2014-09-17 MED ORDER — ALPRAZOLAM 0.5 MG PO TABS: 0.5000 mg | ORAL_TABLET | Freq: Every evening | ORAL | Status: DC | PRN

## 2014-09-17 MED ORDER — SILDENAFIL CITRATE 50 MG PO TABS: 50.0000 mg | ORAL_TABLET | Freq: Every day | ORAL | Status: DC | PRN

## 2014-09-17 MED ORDER — GLIPIZIDE 5 MG PO TABS: 2.5000 mg | ORAL_TABLET | Freq: Two times a day (BID) | ORAL | Status: DC

## 2014-09-17 MED ORDER — FENOFIBRATE 160 MG PO TABS: 160.0000 mg | ORAL_TABLET | Freq: Every day | ORAL | Status: DC

## 2014-09-17 MED ORDER — CYCLOBENZAPRINE HCL 10 MG PO TABS: 10.0000 mg | ORAL_TABLET | Freq: Three times a day (TID) | ORAL | Status: DC | PRN

## 2014-09-17 NOTE — Progress Notes (Signed)
   Subjective:    Patient ID: Austin Morgan, male    DOB: 05-16-1956, 59 y.o.   MRN: [id number]  Diabetes He presents for his follow-up diabetic visit. He has type 2 diabetes mellitus. His disease course has been stable. There are no hypoglycemic associated symptoms. Pertinent negatives for hypoglycemia include no confusion. There are no diabetic associated symptoms. Pertinent negatives for diabetes include no chest pain, no fatigue, no polydipsia, no polyphagia and no weakness. Symptoms are stable. Current diabetic treatment includes oral agent (dual therapy). He is compliant with treatment all of the time. He participates in exercise daily. His highest blood glucose is 70-90 mg/dl. He does not see a podiatrist.Eye exam is not current.    he denies any cardiac symptoms no chest tightness pressure pain denies severe headaches denies rectal bleeding or hematuria in addition to this he states his gout is under fairly good control and he recently got started back on his statin  greater than 25 minutes spent covering multiple health issues  Review of Systems  Constitutional: Negative for activity change, appetite change and fatigue.  HENT: Negative for congestion.   Respiratory: Negative for cough.   Cardiovascular: Negative for chest pain.  Gastrointestinal: Negative for abdominal pain.  Endocrine: Negative for polydipsia and polyphagia.  Neurological: Negative for weakness.  Psychiatric/Behavioral: Negative for confusion.       Objective:   Physical Exam  Constitutional: He appears well-nourished. No distress.  Cardiovascular: Normal rate, regular rhythm and normal heart sounds.   No murmur heard. Pulmonary/Chest: Effort normal and breath sounds normal. No respiratory distress.  Musculoskeletal: He exhibits no edema.  Lymphadenopathy:    He has no cervical adenopathy.  Neurological: He is alert.  Psychiatric: His behavior is normal.  Vitals reviewed.   diabetic foot exam completed  normal       Assessment & Plan:   diabetes excellent control. Continue current measures he denies low sugar spells   hyperlipidemia check lipid profile await results may need to adjust medication recently he was off statin cause of using cultures seen   gout under good control continue current measures does need to have lab work checked await the results   hypertension blood pressure slightly elevated when he comes here but he states blood pressure outside the office is good. Continue current measures.   Patient was encouraged to get eye exam  He was encouraged to get colonoscopy

## 2014-09-19 ENCOUNTER — Telehealth: Payer: Self-pay | Admitting: Family Medicine

## 2014-09-19 NOTE — Telephone Encounter (Signed)
Send pt card stating " in follow up to last visit according to records your next colonoscopy will not be till 2020" ,keep all regular follow up office visits

## 2014-09-20 NOTE — Telephone Encounter (Signed)
Card mailed 

## 2014-09-26 LAB — MICROALBUMIN, URINE: Microalbumin, Urine: 29.1 ug/mL — ABNORMAL HIGH (ref 0.0–17.0)

## 2014-09-26 LAB — LIPID PANEL
Chol/HDL Ratio: 4.9 ratio units (ref 0.0–5.0)
Cholesterol, Total: 166 mg/dL (ref 100–199)
HDL: 34 mg/dL — ABNORMAL LOW (ref >39)
LDL Calculated: 83 mg/dL (ref 0–99)
Triglycerides: 245 mg/dL — ABNORMAL HIGH (ref 0–149)
VLDL Cholesterol Cal: 49 mg/dL — ABNORMAL HIGH (ref 5–40)

## 2014-09-26 LAB — HEPATIC FUNCTION PANEL
ALT: 34 IU/L (ref 0–44)
AST: 24 IU/L (ref 0–40)
Albumin: 4.3 g/dL (ref 3.5–5.5)
Alkaline Phosphatase: 49 IU/L (ref 39–117)
Bilirubin Total: <0.2 mg/dL (ref 0.0–1.2)
Bilirubin, Direct: 0.11 mg/dL (ref 0.00–0.40)
Total Protein: 6.5 g/dL (ref 6.0–8.5)

## 2014-09-26 LAB — BASIC METABOLIC PANEL
BUN/Creatinine Ratio: 12 (ref 9–20)
BUN: 19 mg/dL (ref 6–24)
CO2: 26 mmol/L (ref 18–29)
Calcium: 9.5 mg/dL (ref 8.7–10.2)
Chloride: 101 mmol/L (ref 97–108)
Creatinine, Ser: 1.58 mg/dL — ABNORMAL HIGH (ref 0.76–1.27)
GFR calc Af Amer: 55 mL/min/{1.73_m2} — ABNORMAL LOW (ref >59)
GFR calc non Af Amer: 48 mL/min/{1.73_m2} — ABNORMAL LOW (ref >59)
Glucose: 119 mg/dL — ABNORMAL HIGH (ref 65–99)
Potassium: 4.7 mmol/L (ref 3.5–5.2)
Sodium: 142 mmol/L (ref 134–144)

## 2014-10-06 ENCOUNTER — Other Ambulatory Visit: Payer: Self-pay | Admitting: *Deleted

## 2014-10-06 DIAGNOSIS — Z79899 Other long term (current) drug therapy: Secondary | ICD-10-CM

## 2014-10-06 DIAGNOSIS — R7989 Other specified abnormal findings of blood chemistry: Secondary | ICD-10-CM

## 2014-10-06 DIAGNOSIS — M109 Gout, unspecified: Secondary | ICD-10-CM

## 2014-12-07 ENCOUNTER — Other Ambulatory Visit: Payer: Self-pay | Admitting: Nurse Practitioner

## 2014-12-07 MED ORDER — VALACYCLOVIR HCL 1 G PO TABS: 1000.0000 mg | ORAL_TABLET | Freq: Three times a day (TID) | ORAL | Status: DC

## 2014-12-10 ENCOUNTER — Telehealth: Payer: Self-pay | Admitting: Nurse Practitioner

## 2014-12-10 NOTE — Telephone Encounter (Signed)
12/07/14: patient in with his wife during her office visit. Mentions a rash that appeared about 48 hours ago. Began with pain along the area and within a few days a rash broke out. No fever or malaise. Otherwise states he feels fine. Rash is in the right upper back area with pain radiating into the right anterior chest wall near the right breast. Objective: Cluster of papules/early vesicles noted in the right mid back area. Tenderness along a dermatomal line into the right anterior chest wall. Assessment: Herpes zoster Plan: Valtrex as directed. Capsaicin cream to intact skin. Reviewed symptomatic care warning signs. Call back if symptoms worsen or persist.

## 2015-02-03 ENCOUNTER — Telehealth: Payer: Self-pay | Admitting: Nurse Practitioner

## 2015-02-03 NOTE — Telephone Encounter (Signed)
Left message to return call 

## 2015-02-03 NOTE — Telephone Encounter (Signed)
Spoke with patient's wife and patient states that patient is wanting the generic version of Viagra prescribed due to cost. Patient spoke with a pharmacist that recommended sildenafil due to cheaper cost. Patient wife would like to have this prescription by the weekend if possible.

## 2015-02-03 NOTE — Telephone Encounter (Signed)
Patients spouse called.  She is requesting that Autumn call her back regarding Clanton's medications.

## 2015-02-04 ENCOUNTER — Other Ambulatory Visit: Payer: Self-pay | Admitting: Nurse Practitioner

## 2015-02-04 MED ORDER — SILDENAFIL CITRATE 20 MG PO TABS: ORAL_TABLET | ORAL | Status: DC

## 2015-02-04 MED ORDER — SILDENAFIL CITRATE 50 MG PO TABS: 50.0000 mg | ORAL_TABLET | Freq: Every day | ORAL | Status: DC | PRN

## 2015-02-04 NOTE — Telephone Encounter (Signed)
Received call from pharmacist stating that sidenafil was not available but equivalent medication Revatio was avaliable and was equivalent to the dosage of viagra. Discussed with Pearson Forster, NP in real time and was given verbal order to send in Revatio 20 MG two tablets as needed for erectile dysfunction.

## 2015-02-04 NOTE — Telephone Encounter (Signed)
According to our chart, generic was ordered but I sent in again with specific instructions for generic.

## 2015-03-03 ENCOUNTER — Other Ambulatory Visit: Payer: Self-pay | Admitting: Nurse Practitioner

## 2015-03-03 ENCOUNTER — Telehealth: Payer: Self-pay | Admitting: Family Medicine

## 2015-03-03 DIAGNOSIS — Z79899 Other long term (current) drug therapy: Secondary | ICD-10-CM

## 2015-03-03 DIAGNOSIS — M1000 Idiopathic gout, unspecified site: Secondary | ICD-10-CM

## 2015-03-03 DIAGNOSIS — Z125 Encounter for screening for malignant neoplasm of prostate: Secondary | ICD-10-CM

## 2015-03-03 DIAGNOSIS — E119 Type 2 diabetes mellitus without complications: Secondary | ICD-10-CM

## 2015-03-03 DIAGNOSIS — E785 Hyperlipidemia, unspecified: Secondary | ICD-10-CM

## 2015-03-03 DIAGNOSIS — E781 Pure hyperglyceridemia: Secondary | ICD-10-CM

## 2015-03-03 DIAGNOSIS — I10 Essential (primary) hypertension: Secondary | ICD-10-CM

## 2015-03-03 NOTE — Telephone Encounter (Signed)
done

## 2015-03-03 NOTE — Telephone Encounter (Signed)
[facility]NA 

## 2015-03-03 NOTE — Telephone Encounter (Signed)
Patient notified

## 2015-03-03 NOTE — Telephone Encounter (Signed)
Pt is requesting lab orders to be sent over. Last labs per epic were: microalbumin,lipid,hepatic,bmp, and poct on 09/25/14

## 2015-03-07 ENCOUNTER — Other Ambulatory Visit: Payer: Self-pay | Admitting: Family Medicine

## 2015-03-25 ENCOUNTER — Ambulatory Visit: Payer: BLUE CROSS/BLUE SHIELD | Admitting: Nurse Practitioner

## 2015-04-09 ENCOUNTER — Other Ambulatory Visit: Payer: Self-pay | Admitting: Family Medicine

## 2015-04-11 LAB — BASIC METABOLIC PANEL
BUN/Creatinine Ratio: 15 (ref 9–20)
BUN: 24 mg/dL (ref 6–24)
CO2: 24 mmol/L (ref 18–29)
Calcium: 9.6 mg/dL (ref 8.7–10.2)
Chloride: 101 mmol/L (ref 97–106)
Creatinine, Ser: 1.62 mg/dL — ABNORMAL HIGH (ref 0.76–1.27)
GFR calc Af Amer: 53 mL/min/{1.73_m2} — ABNORMAL LOW (ref >59)
GFR calc non Af Amer: 46 mL/min/{1.73_m2} — ABNORMAL LOW (ref >59)
Glucose: 108 mg/dL — ABNORMAL HIGH (ref 65–99)
Potassium: 4.5 mmol/L (ref 3.5–5.2)
Sodium: 141 mmol/L (ref 136–144)

## 2015-04-11 LAB — LIPID PANEL
Chol/HDL Ratio: 5.4 ratio units — ABNORMAL HIGH (ref 0.0–5.0)
Cholesterol, Total: 184 mg/dL (ref 100–199)
HDL: 34 mg/dL — ABNORMAL LOW (ref >39)
LDL Calculated: 107 mg/dL — ABNORMAL HIGH (ref 0–99)
Triglycerides: 214 mg/dL — ABNORMAL HIGH (ref 0–149)
VLDL Cholesterol Cal: 43 mg/dL — ABNORMAL HIGH (ref 5–40)

## 2015-04-11 LAB — URIC ACID: Uric Acid: 4.1 mg/dL (ref 3.7–8.6)

## 2015-04-11 LAB — HEPATIC FUNCTION PANEL
ALT: 23 IU/L (ref 0–44)
AST: 19 IU/L (ref 0–40)
Albumin: 4.3 g/dL (ref 3.5–5.5)
Alkaline Phosphatase: 46 IU/L (ref 39–117)
Bilirubin Total: 0.5 mg/dL (ref 0.0–1.2)
Bilirubin, Direct: 0.14 mg/dL (ref 0.00–0.40)
Total Protein: 6.4 g/dL (ref 6.0–8.5)

## 2015-04-11 LAB — HEMOGLOBIN A1C
Est. average glucose Bld gHb Est-mCnc: 123 mg/dL
Hgb A1c MFr Bld: 5.9 % — ABNORMAL HIGH (ref 4.8–5.6)

## 2015-04-11 LAB — PSA: Prostate Specific Ag, Serum: 0.5 ng/mL (ref 0.0–4.0)

## 2015-04-15 ENCOUNTER — Ambulatory Visit (INDEPENDENT_AMBULATORY_CARE_PROVIDER_SITE_OTHER): Payer: 59 | Admitting: Nurse Practitioner

## 2015-04-15 ENCOUNTER — Encounter: Payer: Self-pay | Admitting: Nurse Practitioner

## 2015-04-15 VITALS — BP 134/86 | Ht 67.0 in | Wt 194.4 lb

## 2015-04-15 DIAGNOSIS — Z87891 Personal history of nicotine dependence: Secondary | ICD-10-CM

## 2015-04-15 DIAGNOSIS — E781 Pure hyperglyceridemia: Secondary | ICD-10-CM | POA: Diagnosis not present

## 2015-04-15 DIAGNOSIS — E119 Type 2 diabetes mellitus without complications: Secondary | ICD-10-CM | POA: Diagnosis not present

## 2015-04-15 DIAGNOSIS — Z23 Encounter for immunization: Secondary | ICD-10-CM

## 2015-04-15 DIAGNOSIS — I10 Essential (primary) hypertension: Secondary | ICD-10-CM

## 2015-04-15 DIAGNOSIS — E784 Other hyperlipidemia: Secondary | ICD-10-CM

## 2015-04-15 DIAGNOSIS — E785 Hyperlipidemia, unspecified: Secondary | ICD-10-CM

## 2015-04-15 DIAGNOSIS — M1A00X1 Idiopathic chronic gout, unspecified site, with tophus (tophi): Secondary | ICD-10-CM | POA: Diagnosis not present

## 2015-04-18 ENCOUNTER — Encounter: Payer: Self-pay | Admitting: Nurse Practitioner

## 2015-04-18 NOTE — Progress Notes (Signed)
Subjective:  Presents for routine follow-up. Has not been taking his Lipitor. Stopped the last time he started colchicine and never restarted. Rarely takes colchicine at this point. No chest pain/ischemic type pain or shortness of breath. Has begun a regular exercise program with his wife. No edema. Has over a 30-pack-year smoking history. No hemoptysis. No unexplained weight loss. Patient has been working on [employer] and activity. No previous diagnosis of cancer. Is willing to undergo biopsy or surgery if any abnormal findings on CT scan.  Objective:   BP 134/86 mmHg  Ht 5\' 7"  (1.702 m)  Wt 194 lb 6 oz (88.168 kg)  BMI 30.44 kg/m2 NAD. Alert, oriented. Lungs clear. Heart regular rate rhythm. Lower extremities no edema. Reviewed lab work dated 04/09/2015. Triglycerides continue to improve.   Assessment:  Problem List Items Addressed This Visit      Cardiovascular and Mediastinum   Hypertension (Chronic)     Endocrine   Diabetes mellitus without complication (HCC) - Primary (Chronic)     Other   Dyslipidemia (high LDL; low HDL) (Chronic)   Gout   Hypertriglyceridemia (Chronic)    Other Visit Diagnoses    Encounter for immunization        Personal history of tobacco use, presenting hazards to health        Relevant Orders    CT CHEST LUNG CA SCREEN LOW DOSE W/O CM      Continue current medications as directed. Recommend restarting Lipitor. Return in about 6 months (around 10/13/2015) for recheck. Repeat lab work at that time.

## 2015-04-20 ENCOUNTER — Telehealth: Payer: Self-pay | Admitting: Acute Care

## 2015-04-20 NOTE — Telephone Encounter (Signed)
Left Message to make Appointment for Austin Morgan

## 2015-05-09 ENCOUNTER — Other Ambulatory Visit: Payer: Self-pay | Admitting: Acute Care

## 2015-05-09 DIAGNOSIS — F17210 Nicotine dependence, cigarettes, uncomplicated: Secondary | ICD-10-CM

## 2015-05-09 NOTE — Telephone Encounter (Signed)
Appointment made for 12/5 at [facility]

## 2015-05-11 ENCOUNTER — Telehealth: Payer: Self-pay | Admitting: Family Medicine

## 2015-05-11 MED ORDER — GLIPIZIDE 5 MG PO TABS: 2.5000 mg | ORAL_TABLET | Freq: Two times a day (BID) | ORAL | Status: DC

## 2015-05-11 MED ORDER — FENOFIBRATE 160 MG PO TABS: 160.0000 mg | ORAL_TABLET | Freq: Every day | ORAL | Status: DC

## 2015-05-11 MED ORDER — LISINOPRIL 5 MG PO TABS: 5.0000 mg | ORAL_TABLET | Freq: Every day | ORAL | Status: DC

## 2015-05-11 MED ORDER — ALLOPURINOL 300 MG PO TABS: 300.0000 mg | ORAL_TABLET | Freq: Two times a day (BID) | ORAL | Status: DC

## 2015-05-11 MED ORDER — METFORMIN HCL 500 MG PO TABS: ORAL_TABLET | ORAL | Status: DC

## 2015-05-11 MED ORDER — COLCHICINE 0.6 MG PO TABS: 0.6000 mg | ORAL_TABLET | Freq: Two times a day (BID) | ORAL | Status: DC

## 2015-05-11 MED ORDER — HYDROCHLOROTHIAZIDE 25 MG PO TABS: 25.0000 mg | ORAL_TABLET | Freq: Every day | ORAL | Status: DC

## 2015-05-11 NOTE — Telephone Encounter (Signed)
Pt needs all his scheduled meds sent to [facility] for 2 month supply And the other refills for the 6 mo total to [facility] please   If you have questions please call pt for questions, (spouse called not patient)  [phone] his number the [phone] is his work # best to reach him on

## 2015-05-11 NOTE — Telephone Encounter (Signed)
Notified patient meds sent to pharmacy.

## 2015-05-11 NOTE — Telephone Encounter (Signed)
He an Hoyle Sauer were actually getting this set up at his last appt forward to her please

## 2015-05-12 ENCOUNTER — Telehealth: Payer: Self-pay | Admitting: Nurse Practitioner

## 2015-05-12 NOTE — Telephone Encounter (Signed)
Pt's wife called regarding the study that the pt was referred to. Pt's wife was under the impression that there was no cost to be in the study. Pt received a call about the study and there is a flat fee of almost $300. Pt is unable to participate in this study at this time due to the inability to pay the fee.

## 2015-05-12 NOTE — Telephone Encounter (Signed)
Thanks for letting me know. It was my understanding that it would be free. This is still fairly new guideline for [place].

## 2015-05-16 ENCOUNTER — Telehealth: Payer: Self-pay | Admitting: Family Medicine

## 2015-05-16 ENCOUNTER — Encounter: Payer: Self-pay | Admitting: Acute Care

## 2015-05-16 NOTE — Telephone Encounter (Signed)
Rx prior auth APPROVED for pt's colchicine 0.6 MG tablet, valid until 05/15/16 through [facility]

## 2015-05-30 ENCOUNTER — Telehealth: Payer: Self-pay | Admitting: Acute Care

## 2015-05-30 NOTE — Telephone Encounter (Signed)
Spoke to patient. Patient states he spoke to [facility] and they said that it was going to cost him $300.00, insurance would not pay for this.  Patient refused to come since insurance will not pay. Nothing Further Needed at this time.

## 2015-06-07 ENCOUNTER — Other Ambulatory Visit: Payer: Self-pay | Admitting: Family Medicine

## 2015-06-07 NOTE — Telephone Encounter (Signed)
May have this in 2 refills

## 2015-07-23 ENCOUNTER — Other Ambulatory Visit: Payer: Self-pay | Admitting: Family Medicine

## 2015-08-15 ENCOUNTER — Other Ambulatory Visit: Payer: Self-pay | Admitting: Family Medicine

## 2015-09-06 ENCOUNTER — Encounter: Payer: Self-pay | Admitting: Internal Medicine

## 2015-09-28 ENCOUNTER — Other Ambulatory Visit: Payer: Self-pay | Admitting: Family Medicine

## 2015-10-01 ENCOUNTER — Other Ambulatory Visit: Payer: Self-pay | Admitting: Family Medicine

## 2015-10-10 ENCOUNTER — Ambulatory Visit: Payer: 59 | Admitting: Nurse Practitioner

## 2015-12-23 ENCOUNTER — Telehealth: Payer: Self-pay | Admitting: Family Medicine

## 2015-12-23 DIAGNOSIS — E785 Hyperlipidemia, unspecified: Secondary | ICD-10-CM

## 2015-12-23 DIAGNOSIS — E1110 Type 2 diabetes mellitus with ketoacidosis without coma: Secondary | ICD-10-CM

## 2015-12-23 DIAGNOSIS — M109 Gout, unspecified: Secondary | ICD-10-CM

## 2015-12-23 DIAGNOSIS — I10 Essential (primary) hypertension: Secondary | ICD-10-CM

## 2015-12-23 DIAGNOSIS — Z79899 Other long term (current) drug therapy: Secondary | ICD-10-CM

## 2015-12-23 NOTE — Telephone Encounter (Signed)
Orders for bloodwork put in. Pt notified on voicemail.

## 2015-12-23 NOTE — Telephone Encounter (Signed)
Pt is requesting lab orders to be sent over for an upcoming apt. Pt is wanting to get these done tomorrow if possible. Last labs per epic were: bmp,hepatic,a1c,psa,lipid,and uric acid on 04/09/15

## 2015-12-23 NOTE — Telephone Encounter (Signed)
Lipid, liver, metabolic 7, hemoglobin [id number], urine ACR, uric acid

## 2015-12-24 ENCOUNTER — Other Ambulatory Visit: Payer: Self-pay | Admitting: Family Medicine

## 2015-12-26 ENCOUNTER — Encounter: Payer: Self-pay | Admitting: Nurse Practitioner

## 2015-12-26 ENCOUNTER — Ambulatory Visit (INDEPENDENT_AMBULATORY_CARE_PROVIDER_SITE_OTHER): Payer: 59 | Admitting: Nurse Practitioner

## 2015-12-26 VITALS — BP 132/82 | Ht 67.0 in | Wt 195.0 lb

## 2015-12-26 DIAGNOSIS — E785 Hyperlipidemia, unspecified: Secondary | ICD-10-CM

## 2015-12-26 DIAGNOSIS — E784 Other hyperlipidemia: Secondary | ICD-10-CM

## 2015-12-26 DIAGNOSIS — E781 Pure hyperglyceridemia: Secondary | ICD-10-CM

## 2015-12-26 DIAGNOSIS — E119 Type 2 diabetes mellitus without complications: Secondary | ICD-10-CM

## 2015-12-26 DIAGNOSIS — N529 Male erectile dysfunction, unspecified: Secondary | ICD-10-CM | POA: Diagnosis not present

## 2015-12-26 DIAGNOSIS — F419 Anxiety disorder, unspecified: Secondary | ICD-10-CM | POA: Diagnosis not present

## 2015-12-26 DIAGNOSIS — M1A00X1 Idiopathic chronic gout, unspecified site, with tophus (tophi): Secondary | ICD-10-CM | POA: Diagnosis not present

## 2015-12-26 DIAGNOSIS — I10 Essential (primary) hypertension: Secondary | ICD-10-CM | POA: Diagnosis not present

## 2015-12-26 LAB — HEMOGLOBIN A1C
Est. average glucose Bld gHb Est-mCnc: 114 mg/dL
Hgb A1c MFr Bld: 5.6 % (ref 4.8–5.6)

## 2015-12-26 LAB — BASIC METABOLIC PANEL
BUN/Creatinine Ratio: 15 (ref 9–20)
BUN: 22 mg/dL (ref 6–24)
CO2: 24 mmol/L (ref 18–29)
Calcium: 9.1 mg/dL (ref 8.7–10.2)
Chloride: 102 mmol/L (ref 96–106)
Creatinine, Ser: 1.51 mg/dL — ABNORMAL HIGH (ref 0.76–1.27)
GFR calc Af Amer: 58 mL/min/{1.73_m2} — ABNORMAL LOW (ref >59)
GFR calc non Af Amer: 50 mL/min/{1.73_m2} — ABNORMAL LOW (ref >59)
Glucose: 104 mg/dL — ABNORMAL HIGH (ref 65–99)
Potassium: 4.2 mmol/L (ref 3.5–5.2)
Sodium: 143 mmol/L (ref 134–144)

## 2015-12-26 LAB — HEPATIC FUNCTION PANEL
ALT: 28 IU/L (ref 0–44)
AST: 26 IU/L (ref 0–40)
Albumin: 4.3 g/dL (ref 3.5–5.5)
Alkaline Phosphatase: 52 IU/L (ref 39–117)
Bilirubin Total: 0.3 mg/dL (ref 0.0–1.2)
Bilirubin, Direct: 0.12 mg/dL (ref 0.00–0.40)
Total Protein: 6.6 g/dL (ref 6.0–8.5)

## 2015-12-26 LAB — URIC ACID: Uric Acid: 4 mg/dL (ref 3.7–8.6)

## 2015-12-26 LAB — LIPID PANEL
Chol/HDL Ratio: 3.8 ratio units (ref 0.0–5.0)
Cholesterol, Total: 156 mg/dL (ref 100–199)
HDL: 41 mg/dL (ref >39)
LDL Calculated: 74 mg/dL (ref 0–99)
Triglycerides: 207 mg/dL — ABNORMAL HIGH (ref 0–149)
VLDL Cholesterol Cal: 41 mg/dL — ABNORMAL HIGH (ref 5–40)

## 2015-12-26 LAB — MICROALBUMIN / CREATININE URINE RATIO
Creatinine, Urine: 213.3 mg/dL
MICROALB/CREAT RATIO: 18.8 mg/g creat (ref 0.0–30.0)
Microalbumin, Urine: 40 ug/mL

## 2015-12-26 MED ORDER — ATORVASTATIN CALCIUM 40 MG PO TABS: 40.0000 mg | ORAL_TABLET | Freq: Every day | ORAL | Status: DC

## 2015-12-26 MED ORDER — METFORMIN HCL 500 MG PO TABS: ORAL_TABLET | ORAL | Status: DC

## 2015-12-26 MED ORDER — FENOFIBRATE 160 MG PO TABS: ORAL_TABLET | ORAL | Status: DC

## 2015-12-26 MED ORDER — HYDROCHLOROTHIAZIDE 25 MG PO TABS: ORAL_TABLET | ORAL | Status: DC

## 2015-12-26 MED ORDER — GLIPIZIDE 5 MG PO TABS: ORAL_TABLET | ORAL | Status: DC

## 2015-12-26 MED ORDER — LISINOPRIL 5 MG PO TABS: ORAL_TABLET | ORAL | Status: DC

## 2015-12-26 MED ORDER — ALPRAZOLAM 0.5 MG PO TABS: ORAL_TABLET | ORAL | Status: DC

## 2015-12-26 MED ORDER — ALLOPURINOL 300 MG PO TABS: ORAL_TABLET | ORAL | Status: DC

## 2015-12-26 MED ORDER — CYCLOBENZAPRINE HCL 10 MG PO TABS: ORAL_TABLET | ORAL | Status: DC

## 2015-12-27 ENCOUNTER — Encounter: Payer: Self-pay | Admitting: Nurse Practitioner

## 2015-12-27 DIAGNOSIS — F419 Anxiety disorder, unspecified: Secondary | ICD-10-CM

## 2015-12-27 DIAGNOSIS — F329 Major depressive disorder, single episode, unspecified: Secondary | ICD-10-CM | POA: Insufficient documentation

## 2015-12-27 DIAGNOSIS — F32A Depression, unspecified: Secondary | ICD-10-CM | POA: Insufficient documentation

## 2015-12-27 NOTE — Progress Notes (Signed)
Subjective:  Presents for routine follow-up. Needs an eye exam, plans to do this sometime this year. Is due a colonoscopy next year. No chest pain/ischemic type pain or shortness of breath. Very active. Ran out of his Xanax several weeks ago, has noticed an increase in his alcohol intake since then to help him sleep. Does not drink as much when he is on the Xanax. Does not use alcohol while on Xanax.  Objective:   BP 132/82 mmHg  Ht 5\' 7"  (1.702 m)  Wt 195 lb (88.451 kg)  BMI 30.53 kg/m2 NAD. Alert, oriented. Lungs clear. Heart regular rate rhythm. Diabetic Foot Exam - Simple   Simple Foot Form  Diabetic Foot exam was performed with the following findings:  Yes 12/26/2015  8:50 AM  Visual Inspection  No deformities, no ulcerations, no other skin breakdown bilaterally:  Yes  Sensation Testing  Intact to touch and monofilament testing bilaterally:  Yes  Pulse Check  See comments:  Yes  Comments  DP pulses intact bilaterally. Feet warm, toes good capillary refill.      Reviewed labs with patient from 12/24/2015. Concerns and questions answered.  Assessment:  Problem List Items Addressed This Visit      Cardiovascular and Mediastinum   Hypertension (Chronic)   Relevant Medications   atorvastatin (LIPITOR) 40 MG tablet   fenofibrate 160 MG tablet   hydrochlorothiazide (HYDRODIURIL) 25 MG tablet   lisinopril (PRINIVIL,ZESTRIL) 5 MG tablet     Endocrine   Diabetes mellitus without complication (HCC) - Primary (Chronic)   Relevant Medications   atorvastatin (LIPITOR) 40 MG tablet   glipiZIDE (GLUCOTROL) 5 MG tablet   lisinopril (PRINIVIL,ZESTRIL) 5 MG tablet   metFORMIN (GLUCOPHAGE) 500 MG tablet     Genitourinary   Erectile dysfunction (Chronic)     Other   Anxiety   Relevant Medications   ALPRAZolam (XANAX) 0.5 MG tablet   Dyslipidemia (high LDL; low HDL) (Chronic)   Relevant Medications   atorvastatin (LIPITOR) 40 MG tablet   fenofibrate 160 MG tablet   Gout   Hypertriglyceridemia (Chronic)   Relevant Medications   atorvastatin (LIPITOR) 40 MG tablet   fenofibrate 160 MG tablet   hydrochlorothiazide (HYDRODIURIL) 25 MG tablet   lisinopril (PRINIVIL,ZESTRIL) 5 MG tablet     Plan:  Meds ordered this encounter  Medications  . allopurinol (ZYLOPRIM) 300 MG tablet    Sig: Take 1 tablet by mouth two  times daily    Dispense:  180 tablet    Refill:  1    Order Specific Question:  Supervising Provider    Answer:  Mikey Kirschner [[id number]]  . atorvastatin (LIPITOR) 40 MG tablet    Sig: Take 1 tablet (40 mg total) by mouth daily.    Dispense:  90 tablet    Refill:  1    Order Specific Question:  Supervising Provider    Answer:  Mikey Kirschner [[id number]]  . fenofibrate 160 MG tablet    Sig: Take 1 tablet by mouth  daily    Dispense:  90 tablet    Refill:  1    Order Specific Question:  Supervising Provider    Answer:  Mikey Kirschner [[id number]]  . glipiZIDE (GLUCOTROL) 5 MG tablet    Sig: Take one-half tablet by  mouth two times daily  before meals    Dispense:  90 tablet    Refill:  1    Order Specific Question:  Supervising Provider    Answer:  Wolfgang Phoenix,  WILLIAM S [[id number]]  . hydrochlorothiazide (HYDRODIURIL) 25 MG tablet    Sig: Take 1 tablet by mouth  daily    Dispense:  90 tablet    Refill:  1    Order Specific Question:  Supervising Provider    Answer:  Mikey Kirschner [[id number]]  . lisinopril (PRINIVIL,ZESTRIL) 5 MG tablet    Sig: Take 1 tablet by mouth  daily    Dispense:  90 tablet    Refill:  1    Order Specific Question:  Supervising Provider    Answer:  Mikey Kirschner [[id number]]  . metFORMIN (GLUCOPHAGE) 500 MG tablet    Sig: Take 1 tablet by mouth two  times daily    Dispense:  180 tablet    Refill:  1    Order Specific Question:  Supervising Provider    Answer:  Mikey Kirschner [[id number]]  . cyclobenzaprine (FLEXERIL) 10 MG tablet    Sig: TAKE ONE TABLET BY MOUTH 3 TIMES DAILY AS NEEDED FOR MUSCLE SPASM(S).    Dispense:  90  tablet    Refill:  0    Order Specific Question:  Supervising Provider    Answer:  Mikey Kirschner [[id number]]  . DISCONTD: ALPRAZolam (XANAX) 0.5 MG tablet    Sig: TAKE ONE TABLET BY MOUTH AT BEDTIME AS NEEDED.    Dispense:  30 tablet    Refill:  0    Order Specific Question:  Supervising Provider    Answer:  Mikey Kirschner [[id number]]  . ALPRAZolam (XANAX) 0.5 MG tablet    Sig: TAKE ONE TABLET BY MOUTH AT BEDTIME AS NEEDED.    Dispense:  90 tablet    Refill:  0   Encouraged continued activity. Discussed importance of smoking cessation. Again advised patient not to take Xanax with alcohol. Encouraged eye exam sometime within the next few months. 40 minutes was spent with this patient. Return in about 6 months (around 06/27/2016) for recheck.

## 2016-02-14 ENCOUNTER — Encounter: Payer: Self-pay | Admitting: Family Medicine

## 2016-03-12 ENCOUNTER — Telehealth: Payer: Self-pay | Admitting: Nurse Practitioner

## 2016-03-12 NOTE — Telephone Encounter (Signed)
Pt is also needing a tb gold lab done. Pt's wife and daughter tested positive a while back due to exposure on a plane.

## 2016-03-12 NOTE — Telephone Encounter (Signed)
Patient needs refill on his sildenafil.  Also, he would like to quit smoking and would like something for this.   Larene Pickett

## 2016-03-13 NOTE — Telephone Encounter (Signed)
He may have 5 refills of sildenafil. As for the TB test please order Quantiferon Gold Test ( QFT-G) or another name his interferon gamma release assay for TB. As for quitting smoking I think that a great idea. To adequately and thoroughly cover the options for quitting smoking I recommend the patient follow-up with myself or Hoyle Sauer for discussion of options. Options include nicotine gum, nicotine patches, Wellbutrin and Chantix. Each one of these can have potential side effects especially Wellbutrin and Chantix that can be serious if they occur. To adequately cover these from a thorough medical standpoint I would recommend an office visit

## 2016-03-14 ENCOUNTER — Other Ambulatory Visit: Payer: Self-pay | Admitting: *Deleted

## 2016-03-14 DIAGNOSIS — Z111 Encounter for screening for respiratory tuberculosis: Secondary | ICD-10-CM

## 2016-03-14 MED ORDER — SILDENAFIL CITRATE 50 MG PO TABS: 50.0000 mg | ORAL_TABLET | Freq: Every day | ORAL | 5 refills | Status: DC | PRN

## 2016-03-14 NOTE — Telephone Encounter (Signed)
Left message to return call to discuss with pt. Needs ov to discuss smoking cessation, sildenafil sent to [facility], lab ordered.

## 2016-03-20 NOTE — Telephone Encounter (Signed)
Notified patient's wife [name]) needs ov to discuss smoking cessation, sildenafil sent to [facility], lab ordered. [name] wants Hoyle Sauer to call her at [phone]. She states that it is extremely hard for the patient to come in for a office visit because he works out of town Mon- Fri. She wants to know if Hoyle Sauer will send in a medication she is comfortable with for smoking cessation.

## 2016-04-04 ENCOUNTER — Other Ambulatory Visit: Payer: Self-pay | Admitting: Nurse Practitioner

## 2016-04-04 MED ORDER — VARENICLINE TARTRATE 1 MG PO TABS
1.0000 mg | ORAL_TABLET | Freq: Two times a day (BID) | ORAL | 1 refills | Status: DC
Start: 2016-04-04 — End: 2016-05-04

## 2016-04-04 MED ORDER — VARENICLINE TARTRATE 0.5 MG X 11 & 1 MG X 42 PO MISC: ORAL | 0 refills | Status: DC

## 2016-04-04 NOTE — Telephone Encounter (Signed)
Spoke with [name], patient's wife who is an NP. Will send in Chantix to help with smoking cessation. Is aware of potential adverse effects. DC med if any problem.

## 2016-04-18 LAB — QUANTIFERON IN TUBE
QFT TB AG MINUS NIL VALUE: <0 IU/mL
QUANTIFERON MITOGEN VALUE: 6.68 IU/mL
QUANTIFERON TB AG VALUE: 0.01 IU/mL
QUANTIFERON TB GOLD: NEGATIVE
Quantiferon Nil Value: 0.02 IU/mL

## 2016-04-18 LAB — QUANTIFERON TB GOLD ASSAY (BLOOD)

## 2016-04-27 ENCOUNTER — Telehealth: Payer: Self-pay | Admitting: Family Medicine

## 2016-04-27 NOTE — Telephone Encounter (Signed)
Spoke with patient and informed him per Dr.Scott Luking- TB test was negative. Patient verbalized understanding.

## 2016-04-27 NOTE — Telephone Encounter (Signed)
Nurse's-please call patient-message was sent via my chart. TB test negative.

## 2016-04-27 NOTE — Telephone Encounter (Signed)
Calling to get results to lab work.

## 2016-05-04 ENCOUNTER — Telehealth: Payer: Self-pay | Admitting: Family Medicine

## 2016-05-04 MED ORDER — VARENICLINE TARTRATE 1 MG PO TABS: 1.0000 mg | ORAL_TABLET | Freq: Two times a day (BID) | ORAL | 1 refills | Status: DC

## 2016-05-04 NOTE — Telephone Encounter (Signed)
Pt is needing a refill on his varenicline (CHANTIX CONTINUING MONTH PAK) 1 MG tablet  Pt has been smoke free for two weeks now and is wanting to continue this medication for at least another month. Pt has had no side effects to the medication.   [place]

## 2016-05-04 NOTE — Telephone Encounter (Signed)
Ok plus one ref 

## 2016-05-04 NOTE — Telephone Encounter (Signed)
Notified patient refill sent to pharmacy.

## 2016-06-08 ENCOUNTER — Other Ambulatory Visit: Payer: Self-pay | Admitting: Nurse Practitioner

## 2016-06-23 ENCOUNTER — Other Ambulatory Visit: Payer: Self-pay | Admitting: Nurse Practitioner

## 2016-06-29 ENCOUNTER — Ambulatory Visit (INDEPENDENT_AMBULATORY_CARE_PROVIDER_SITE_OTHER): Payer: 59 | Admitting: Nurse Practitioner

## 2016-06-29 ENCOUNTER — Encounter: Payer: Self-pay | Admitting: Nurse Practitioner

## 2016-06-29 ENCOUNTER — Ambulatory Visit: Payer: 59 | Admitting: Nurse Practitioner

## 2016-06-29 VITALS — BP 140/90 | Ht 67.0 in | Wt 209.0 lb

## 2016-06-29 DIAGNOSIS — E119 Type 2 diabetes mellitus without complications: Secondary | ICD-10-CM

## 2016-06-29 DIAGNOSIS — I10 Essential (primary) hypertension: Secondary | ICD-10-CM | POA: Diagnosis not present

## 2016-06-29 DIAGNOSIS — E785 Hyperlipidemia, unspecified: Secondary | ICD-10-CM

## 2016-06-29 DIAGNOSIS — Z79899 Other long term (current) drug therapy: Secondary | ICD-10-CM

## 2016-06-29 DIAGNOSIS — E781 Pure hyperglyceridemia: Secondary | ICD-10-CM | POA: Diagnosis not present

## 2016-06-29 DIAGNOSIS — Z23 Encounter for immunization: Secondary | ICD-10-CM

## 2016-06-29 DIAGNOSIS — E784 Other hyperlipidemia: Secondary | ICD-10-CM | POA: Diagnosis not present

## 2016-06-29 DIAGNOSIS — Z125 Encounter for screening for malignant neoplasm of prostate: Secondary | ICD-10-CM

## 2016-06-29 LAB — POCT GLYCOSYLATED HEMOGLOBIN (HGB A1C): Hemoglobin A1C: 5.5

## 2016-06-29 MED ORDER — ALPRAZOLAM 0.5 MG PO TABS: ORAL_TABLET | ORAL | 0 refills | Status: DC

## 2016-06-30 ENCOUNTER — Encounter: Payer: Self-pay | Admitting: Nurse Practitioner

## 2016-06-30 NOTE — Progress Notes (Signed)
Subjective:  Presents for routine follow up for chronic health issues. Quit smoking in October. Has gained some weight but not as much as our scales are showing. Was wearing his heavy steel toed boots. No CP/ischemic type pain or SOB. Breathing has greatly improved since smoking cessation. No side effects on Chantix.  No edema. Gout stable.   Objective:   BP 140/90   Ht 5\' 7"  (1.702 m)   Wt 209 lb (94.8 kg)   BMI 32.73 kg/m  NAD. Alert, oriented. Lungs clear. Heart RRR. Lower extremities no edema. Results for orders placed or performed in visit on 06/29/16  POCT glycosylated hemoglobin (Hb A1C)  Result Value Ref Range   Hemoglobin A1C 5.5      Assessment:   Problem List Items Addressed This Visit      Cardiovascular and Mediastinum   Hypertension (Chronic)     Other   Dyslipidemia (high LDL; low HDL) (Chronic)   Hypertriglyceridemia (Chronic)   Relevant Orders   Lipid panel    Other Visit Diagnoses    Type 2 diabetes mellitus without complication, without long-term current use of insulin (HCC)    -  Primary   Relevant Orders   POCT glycosylated hemoglobin (Hb A1C) (Completed)   High risk medication use       Relevant Orders   Hepatic function panel   Basic metabolic panel   Need for vaccination       Relevant Orders   Flu Vaccine QUAD 36+ mos PF IM (Fluarix & Fluzone Quad PF) (Completed)   Screening for prostate cancer       Relevant Orders   PSA       Plan:  Meds ordered this encounter  Medications  . ALPRAZolam (XANAX) 0.5 MG tablet    Sig: TAKE ONE TABLET BY MOUTH AT BEDTIME AS NEEDED.    Dispense:  90 tablet    Refill:  0    Order Specific Question:   Supervising Provider    Answer:   Mikey Kirschner [[id number]]   Takes rare Xanax. Given refill today. Continue current meds. Patient questions which ones he is currently on through mail .  He and his wife will cross reference with his list on check out and let us know if any are missing. Recommend eye exam this  year.  Return in about 4 months (around 10/27/2016) for diabetes check up.

## 2016-07-01 LAB — BASIC METABOLIC PANEL
BUN/Creatinine Ratio: 11 (ref 9–20)
BUN: 17 mg/dL (ref 6–24)
CO2: 23 mmol/L (ref 18–29)
Calcium: 9.4 mg/dL (ref 8.7–10.2)
Chloride: 98 mmol/L (ref 96–106)
Creatinine, Ser: 1.56 mg/dL — ABNORMAL HIGH (ref 0.76–1.27)
GFR calc Af Amer: 55 mL/min/{1.73_m2} — ABNORMAL LOW (ref >59)
GFR calc non Af Amer: 48 mL/min/{1.73_m2} — ABNORMAL LOW (ref >59)
Glucose: 128 mg/dL — ABNORMAL HIGH (ref 65–99)
Potassium: 3.8 mmol/L (ref 3.5–5.2)
Sodium: 140 mmol/L (ref 134–144)

## 2016-07-01 LAB — LIPID PANEL
Chol/HDL Ratio: 4.8 ratio units (ref 0.0–5.0)
Cholesterol, Total: 179 mg/dL (ref 100–199)
HDL: 37 mg/dL — ABNORMAL LOW (ref >39)
LDL Calculated: 102 mg/dL — ABNORMAL HIGH (ref 0–99)
Triglycerides: 198 mg/dL — ABNORMAL HIGH (ref 0–149)
VLDL Cholesterol Cal: 40 mg/dL (ref 5–40)

## 2016-07-01 LAB — HEPATIC FUNCTION PANEL
ALT: 24 IU/L (ref 0–44)
AST: 23 IU/L (ref 0–40)
Albumin: 4.3 g/dL (ref 3.5–5.5)
Alkaline Phosphatase: 52 IU/L (ref 39–117)
Bilirubin Total: 0.5 mg/dL (ref 0.0–1.2)
Bilirubin, Direct: 0.14 mg/dL (ref 0.00–0.40)
Total Protein: 6.7 g/dL (ref 6.0–8.5)

## 2016-07-01 LAB — PSA: Prostate Specific Ag, Serum: 0.7 ng/mL (ref 0.0–4.0)

## 2016-07-02 ENCOUNTER — Ambulatory Visit: Payer: 59 | Admitting: Nurse Practitioner

## 2016-08-31 ENCOUNTER — Other Ambulatory Visit: Payer: Self-pay | Admitting: Family Medicine

## 2016-09-11 ENCOUNTER — Other Ambulatory Visit: Payer: Self-pay | Admitting: Nurse Practitioner

## 2016-09-12 ENCOUNTER — Other Ambulatory Visit: Payer: Self-pay | Admitting: *Deleted

## 2016-09-12 MED ORDER — METFORMIN HCL 500 MG PO TABS: ORAL_TABLET | ORAL | 0 refills | Status: DC

## 2016-09-12 MED ORDER — FENOFIBRATE 160 MG PO TABS: ORAL_TABLET | ORAL | 0 refills | Status: DC

## 2016-09-12 MED ORDER — GLIPIZIDE 5 MG PO TABS: ORAL_TABLET | ORAL | 0 refills | Status: DC

## 2016-09-12 MED ORDER — HYDROCHLOROTHIAZIDE 25 MG PO TABS: ORAL_TABLET | ORAL | 0 refills | Status: DC

## 2016-09-12 MED ORDER — LISINOPRIL 5 MG PO TABS: ORAL_TABLET | ORAL | 0 refills | Status: DC

## 2016-09-13 ENCOUNTER — Other Ambulatory Visit: Payer: Self-pay

## 2016-09-13 MED ORDER — ATORVASTATIN CALCIUM 40 MG PO TABS: 40.0000 mg | ORAL_TABLET | Freq: Every day | ORAL | 0 refills | Status: DC

## 2016-09-18 ENCOUNTER — Telehealth: Payer: Self-pay | Admitting: Nurse Practitioner

## 2016-09-18 NOTE — Telephone Encounter (Signed)
Pt is requesting a sleep study to be ordered. Pt snores and jerks in his sleep. Pt does not want to go through [facility].

## 2016-09-18 NOTE — Telephone Encounter (Signed)
Patient advised he would need office visit to set up sleep study to be able to get pre cert through his insurance. Patient scheduled an appointment for 09/27/16 at 8am with Dr Nicki Reaper

## 2016-09-23 ENCOUNTER — Telehealth: Payer: Self-pay | Admitting: Family Medicine

## 2016-09-23 NOTE — Telephone Encounter (Signed)
Patient will be coming in for office visit when the patient comes we will discuss fenofibrate/statin use in regards to current approach

## 2016-09-27 ENCOUNTER — Ambulatory Visit (INDEPENDENT_AMBULATORY_CARE_PROVIDER_SITE_OTHER): Payer: 59 | Admitting: Family Medicine

## 2016-09-27 ENCOUNTER — Encounter: Payer: Self-pay | Admitting: Family Medicine

## 2016-09-27 VITALS — BP 136/86 | Ht 67.0 in | Wt 210.2 lb

## 2016-09-27 DIAGNOSIS — G4733 Obstructive sleep apnea (adult) (pediatric): Secondary | ICD-10-CM

## 2016-09-27 DIAGNOSIS — E119 Type 2 diabetes mellitus without complications: Secondary | ICD-10-CM | POA: Diagnosis not present

## 2016-09-27 DIAGNOSIS — I10 Essential (primary) hypertension: Secondary | ICD-10-CM | POA: Diagnosis not present

## 2016-09-27 DIAGNOSIS — E781 Pure hyperglyceridemia: Secondary | ICD-10-CM

## 2016-09-27 NOTE — Progress Notes (Signed)
   Subjective:    Patient ID: Austin Morgan, male    DOB: 08-May-1956, 61 y.o.   MRN: [id number]  HPI Patient arrives to discuss having a sleep study. Patient states his wife says he snores and jerks a lot. Patient has a lot of snoring and daytime somnolence fatigue tiredness he's been told that he stops breathing at night. His wife very concerned  He also has diabetes hyperlipidemia and hypertension but he will be following up with Hoyle Sauer in the near future for this  Review of Systems  Constitutional: Negative for activity change, appetite change and fatigue.  HENT: Negative for congestion.   Respiratory: Negative for cough.   Cardiovascular: Negative for chest pain.  Gastrointestinal: Negative for abdominal pain.  Endocrine: Negative for polydipsia and polyphagia.  Neurological: Negative for weakness.  Psychiatric/Behavioral: Negative for confusion.       Objective:   Physical Exam  Constitutional: He appears well-nourished. No distress.  Cardiovascular: Normal rate, regular rhythm and normal heart sounds.   No murmur heard. Pulmonary/Chest: Effort normal and breath sounds normal. No respiratory distress.  Musculoskeletal: He exhibits no edema.  Lymphadenopathy:    He has no cervical adenopathy.  Neurological: He is alert.  Psychiatric: His behavior is normal.  Vitals reviewed.         Assessment & Plan:  This patient will be doing his standard lab work coming up to follow-up with Hoyle Sauer for a visit regarding diabetes  I did talk with the patient at length about how if his triglycerides are within reasonable range for consideration to stop fenofibrate-because of concern for interaction with atorvastatin and rhabdomyolysis- did if he can do a good job keeping his diabetes under control he should be a little to avoid his triglycerides rising too much. He will discuss this further with Hoyle Sauer on follow-up.  Probable sleep apnea based upon [place] patient needs  to go ahead with sleep study split protocol recommended follow-up if ongoing troubles

## 2016-10-06 ENCOUNTER — Other Ambulatory Visit: Payer: Self-pay | Admitting: Family Medicine

## 2016-10-08 NOTE — Telephone Encounter (Signed)
May have this +3 refills 

## 2016-10-19 ENCOUNTER — Ambulatory Visit (INDEPENDENT_AMBULATORY_CARE_PROVIDER_SITE_OTHER): Payer: 59 | Admitting: Nurse Practitioner

## 2016-10-19 ENCOUNTER — Encounter: Payer: Self-pay | Admitting: Nurse Practitioner

## 2016-10-19 VITALS — BP 128/90 | Ht 67.0 in | Wt 210.0 lb

## 2016-10-19 DIAGNOSIS — E781 Pure hyperglyceridemia: Secondary | ICD-10-CM

## 2016-10-19 DIAGNOSIS — I10 Essential (primary) hypertension: Secondary | ICD-10-CM | POA: Diagnosis not present

## 2016-10-19 DIAGNOSIS — F419 Anxiety disorder, unspecified: Secondary | ICD-10-CM | POA: Diagnosis not present

## 2016-10-19 DIAGNOSIS — E119 Type 2 diabetes mellitus without complications: Secondary | ICD-10-CM | POA: Diagnosis not present

## 2016-10-19 DIAGNOSIS — M1A00X1 Idiopathic chronic gout, unspecified site, with tophus (tophi): Secondary | ICD-10-CM

## 2016-10-19 DIAGNOSIS — E785 Hyperlipidemia, unspecified: Secondary | ICD-10-CM

## 2016-10-19 DIAGNOSIS — Z79899 Other long term (current) drug therapy: Secondary | ICD-10-CM

## 2016-10-19 LAB — POCT GLYCOSYLATED HEMOGLOBIN (HGB A1C): Hemoglobin A1C: 6.1

## 2016-10-19 MED ORDER — ALPRAZOLAM 0.5 MG PO TABS: 0.5000 mg | ORAL_TABLET | Freq: Every evening | ORAL | 2 refills | Status: DC | PRN

## 2016-10-20 ENCOUNTER — Encounter: Payer: Self-pay | Admitting: Nurse Practitioner

## 2016-10-20 NOTE — Progress Notes (Signed)
Subjective:  Presents for routine follow up of chronic health problems. No CP/ischemic type pain or SOB. No edema. Takes Xanax for sleep. Currently on Fenofibrate and Lipitor. Unsure if he should take both. Denies any abdominal pain or muscle aches. Compliant with medications.   Objective:   BP 128/90   Ht 5\' 7"  (1.702 m)   Wt 210 lb (95.3 kg)   BMI 32.89 kg/m  NAD. Alert, oriented. Lungs clear. Heart RRR. LE: no edema.  Results for orders placed or performed in visit on 10/19/16  POCT glycosylated hemoglobin (Hb A1C)  Result Value Ref Range   Hemoglobin A1C 6.1      Assessment:   Problem List Items Addressed This Visit      Cardiovascular and Mediastinum   Hypertension (Chronic)   Relevant Orders   Basic metabolic panel     Endocrine   Diabetes mellitus without complication (HCC) - Primary (Chronic)   Relevant Orders   POCT glycosylated hemoglobin (Hb A1C) (Completed)   Microalbumin / creatinine urine ratio     Other   Anxiety   Relevant Medications   ALPRAZolam (XANAX) 0.5 MG tablet   Dyslipidemia (high LDL; low HDL) (Chronic)   Relevant Orders   Lipid panel   Gout   Relevant Orders   Uric acid   Hypertriglyceridemia (Chronic)    Other Visit Diagnoses    High risk medication use       Relevant Orders   Hepatic function panel       Plan:   Meds ordered this encounter  Medications  . ALPRAZolam (XANAX) 0.5 MG tablet    Sig: Take 1 tablet (0.5 mg total) by mouth at bedtime as needed. for sleep    Dispense:  30 tablet    Refill:  2    Order Specific Question:   Supervising Provider    Answer:   Mikey Kirschner [[id number]]   After visit, consultation was done with cardiology. Message sent through my chart to stop Fenofibrate. Reviewed labs with patient from 06/30/16.  Return in about 4 months (around 02/19/2017) for labs and office visit.

## 2016-11-22 ENCOUNTER — Other Ambulatory Visit: Payer: Self-pay | Admitting: Family Medicine

## 2016-11-29 ENCOUNTER — Ambulatory Visit: Payer: 59 | Attending: Family Medicine | Admitting: Neurology

## 2016-11-29 DIAGNOSIS — R0683 Snoring: Secondary | ICD-10-CM

## 2016-11-29 DIAGNOSIS — G4733 Obstructive sleep apnea (adult) (pediatric): Secondary | ICD-10-CM | POA: Insufficient documentation

## 2016-11-29 DIAGNOSIS — R5383 Other fatigue: Secondary | ICD-10-CM

## 2016-12-01 ENCOUNTER — Other Ambulatory Visit: Payer: Self-pay | Admitting: Family Medicine

## 2016-12-01 ENCOUNTER — Other Ambulatory Visit: Payer: Self-pay | Admitting: Nurse Practitioner

## 2016-12-04 ENCOUNTER — Other Ambulatory Visit: Payer: Self-pay

## 2016-12-04 MED ORDER — ATORVASTATIN CALCIUM 40 MG PO TABS: 40.0000 mg | ORAL_TABLET | Freq: Every day | ORAL | 1 refills | Status: DC

## 2016-12-08 NOTE — Procedures (Signed)
Monterey A. Merlene Laughter, MD     [email or web address]             HOME SLEEP TEST  LOCATION: [facility]   Patient Name: Austin Morgan, Austin Morgan Date: 11/29/2016 Gender: Male D.O.B: 16-Dec-1955 Age (years): 61 Referring Provider: Sallee Lange Height (inches): 3 Interpreting Physician: Phillips Odor MD, ABSM Weight (lbs): 210 RPSGT: Peak, Ziggy BMI: 33 MRN: [id number] Neck Size: 17.00 CLINICAL INFORMATION Sleep Study Type: HST  Indication for sleep study: Fatigue, OSA, Snoring  Epworth Sleepiness Score: NA  SLEEP STUDY TECHNIQUE A multi-channel overnight portable sleep study was performed. The channels recorded were: nasal airflow, thoracic respiratory movement, and oxygen saturation with a pulse oximetry. Snoring was also monitored.  MEDICATIONS Patient self administered medications include: N/A.  Current Outpatient Prescriptions:  .  allopurinol (ZYLOPRIM) 300 MG tablet, TAKE 1 TABLET BY MOUTH TWO  TIMES DAILY, Disp: 180 tablet, Rfl: 0 .  ALPRAZolam (XANAX) 0.5 MG tablet, Take 1 tablet (0.5 mg total) by mouth at bedtime as needed. for sleep, Disp: 30 tablet, Rfl: 2 .  aspirin 81 MG tablet, Take 81 mg by mouth daily., Disp: , Rfl:  .  atorvastatin (LIPITOR) 40 MG tablet, Take 1 tablet (40 mg total) by mouth daily., Disp: 90 tablet, Rfl: 1 .  colchicine 0.6 MG tablet, Take 1 tablet (0.6 mg total) by mouth 2 (two) times daily., Disp: 180 tablet, Rfl: 1 .  cyclobenzaprine (FLEXERIL) 10 MG tablet, TAKE ONE TABLET BY MOUTH 3  TIMES DAILY AS NEEDED FOR  MUSCLE SPASM ., Disp: 90 tablet, Rfl: 0 .  fenofibrate 160 MG tablet, TAKE 1 TABLET BY MOUTH  DAILY, Disp: 90 tablet, Rfl: 0 .  fish oil-omega-3 fatty acids 1000 MG capsule, Take 1 g by mouth daily. , Disp: , Rfl:  .  glipiZIDE (GLUCOTROL) 5 MG tablet, TAKE ONE-HALF TABLET BY  MOUTH TWO TIMES DAILY  BEFORE MEALS, Disp: 90 tablet, Rfl: 0 .  hydrochlorothiazide (HYDRODIURIL) 25 MG tablet, TAKE 1 TABLET BY MOUTH   DAILY, Disp: 90 tablet, Rfl: 0 .  lisinopril (PRINIVIL,ZESTRIL) 5 MG tablet, TAKE 1 TABLET BY MOUTH  DAILY, Disp: 90 tablet, Rfl: 0 .  metFORMIN (GLUCOPHAGE) 500 MG tablet, TAKE 1 TABLET BY MOUTH TWO  TIMES DAILY, Disp: 180 tablet, Rfl: 0 .  Multiple Vitamins-Minerals (MULTIVITAMIN WITH MINERALS) tablet, Take 1 tablet by mouth daily., Disp: , Rfl:  .  naproxen sodium (ANAPROX) 220 MG tablet, Take 220 mg by mouth as needed., Disp: , Rfl:  .  sildenafil (VIAGRA) 50 MG tablet, Take 1 tablet (50 mg total) by mouth daily as needed for erectile dysfunction., Disp: 10 tablet, Rfl: 5 .  TRUETEST TEST test strip, TEST ONCE EVERY DAY, Disp: 32 each, Rfl: 5   SLEEP ARCHITECTURE Patient was studied for 389.3 minutes. The sleep efficiency was 72.3 % and the patient was supine for 47.9%. The arousal index was 0.0 per hour.  RESPIRATORY PARAMETERS The overall AHI was 27.6 per hour, with a central apnea index of 1.1 per hour.  The oxygen nadir was 65% during sleep.  CARDIAC DATA Mean heart rate during sleep was 104.1 bpm.  IMPRESSIONS - Moderate obstructive sleep apnea occurred during this study (AHI = 27.6/h). A formal CPAP titration study is recommened.   Delano Metz, MD Diplomate, American Board of Sleep Medicine.  ELECTRONICALLY SIGNED ON:  12/08/2016, 3:09 PM [place] PH: [phone]   FX: [phone]) [place]  [facility]IC[facility]

## 2016-12-13 ENCOUNTER — Other Ambulatory Visit: Payer: Self-pay | Admitting: Nurse Practitioner

## 2016-12-13 MED ORDER — ATORVASTATIN CALCIUM 40 MG PO TABS: 40.0000 mg | ORAL_TABLET | Freq: Every day | ORAL | 1 refills | Status: DC

## 2016-12-14 ENCOUNTER — Telehealth: Payer: Self-pay | Admitting: Family Medicine

## 2016-12-14 DIAGNOSIS — G4730 Sleep apnea, unspecified: Secondary | ICD-10-CM

## 2016-12-14 NOTE — Telephone Encounter (Signed)
Patient advised The test does indicate sleep apnea. It is recommended for the patient to do a CPAP titration study in order to figure out the proper pressure for CPAP. This could be done potentially from a auto CPAP but typically would be best if he had a sleep titration portion of the study. Please help set this up-after this is completed it would be in the patient's best interest to do a face-to-face discussion regarding sleep apnea the treatment of it the risk and benefits of sleep apnea this could be with Hoyle Sauer or with myself. Patient verbalized understanding. Referral ordered in EPIC.

## 2016-12-14 NOTE — Telephone Encounter (Signed)
Pt is wanting to know the results to his sleep study. The results are in the system.

## 2016-12-14 NOTE — Telephone Encounter (Signed)
The test does indicate sleep apnea. It is recommended for the patient to do a CPAP titration study in order to figure out the proper pressure for CPAP. This could be done potentially from a auto CPAP but typically would be best if he had a sleep titration portion of the study. Please help set this up-after this is completed it would be in the patient's best interest to do a face-to-face discussion regarding sleep apnea the treatment of it the risk and benefits of sleep apnea this could be with Hoyle Sauer or with myself

## 2016-12-14 NOTE — Telephone Encounter (Signed)
Ordered by Hoyle Sauer

## 2017-01-02 ENCOUNTER — Telehealth: Payer: Self-pay | Admitting: Family Medicine

## 2017-01-02 NOTE — Telephone Encounter (Signed)
That would be fine-please explain rationale to patient he should do fine on auto titration

## 2017-01-02 NOTE — Telephone Encounter (Signed)
Per [facility] - pt does not meet criteria to have an in-lab CPAP titration study  Spoke with respiratory dept @ [facility] - they recommend ordering the patient an auto titration machine (this will adjust pressure as needed throughout sleep)  This is normally ordered in place of getting an actual titration study and works well for patients  If ok, I will write order for auto machine and send to you for signature  Please advise

## 2017-01-03 ENCOUNTER — Telehealth: Payer: Self-pay | Admitting: Family Medicine

## 2017-01-03 ENCOUNTER — Other Ambulatory Visit: Payer: Self-pay | Admitting: Family Medicine

## 2017-01-03 NOTE — Telephone Encounter (Signed)
Tests does show sleep apnea. We are working on trying to get his CPAP machine approved through his insurance. I am more than happy to have a follow-up office visit with myself or Hoyle Sauer to discuss sleep apnea in detail regarding treatment ramifications etc. This can be done at his convenience. Otherwise what is typically done is starting CPAP machine with follow-up office visit 3-4 weeks after starting the machine to see how the patient is tolerating and if it is helping his symptoms

## 2017-01-03 NOTE — Telephone Encounter (Signed)
Last seen 10/19/16

## 2017-01-03 NOTE — Telephone Encounter (Signed)
Last med check may 2018, sleep study done in June. Results in epic under notes

## 2017-01-03 NOTE — Telephone Encounter (Signed)
Requesting results to sleep study.  Also, patient needs Rx for cyclobenzaprine (FLEXERIL) 10 MG tablet to [place].

## 2017-01-03 NOTE — Telephone Encounter (Signed)
Please sign order for Auto CPAP in red folder in yellow box & forward to me to be faxed with required documentation

## 2017-01-04 NOTE — Telephone Encounter (Signed)
Left message return call 01/04/17

## 2017-01-04 NOTE — Telephone Encounter (Signed)
Patient stated that he will follow up after 3-4 weeks after starting CPAP. He just does not feel a need to come in before getting CPAP to discuss sleep apnea. He would like to be notified when all CPAP machine prescription will be sent in and when it is approved.

## 2017-01-04 NOTE — Telephone Encounter (Signed)
Long story short -his sleep study shows sleep apnea. Typically we do a titration study to see what pressure to have him on, his insurance company would not approve for a titration study, therefore we are getting up auto titration CPAP machine for him. Now at the same time it is recommended as standard protocol a follow-up 3-4 weeks after starting CPAP in order to see how well it is doing. Many insurance companies require this in order for them to keep pain for the CPAP machine.( If the patient does not one to do a follow-up visit that is on him). We are just trying to follow protocol and what is recommended

## 2017-01-04 NOTE — Telephone Encounter (Signed)
Austin Morgan -[name]

## 2017-01-04 NOTE — Telephone Encounter (Signed)
Spoke with patient and informed him per Dr.Scott Luking- Tests does show sleep apnea. We are working on trying to get his CPAP machine approved through his insurance. I am more than happy to have a follow-up office visit with myself or Hoyle Sauer to discuss sleep apnea in detail regarding treatment ramifications etc. This can be done at his convenience. Otherwise what is typically done is starting CPAP machine with follow-up office visit 3-4 weeks after starting the machine to see how the patient is tolerating and if it is helping his symptoms. Patient verbalized understanding and stated that he does not feel that he needs a office visit he would just like to know when CPAP is approved and what the next steps are. Please advise?

## 2017-01-07 NOTE — Telephone Encounter (Signed)
Rx for auto CPAP faxed with required documentation to [facility] with pt, explained order being sent & they'll contact him to set up, pt verbalized understanding

## 2017-01-07 NOTE — Telephone Encounter (Signed)
This was signed thank you 

## 2017-01-10 NOTE — Telephone Encounter (Signed)
Order & documentation faxed to [facility]

## 2017-01-11 ENCOUNTER — Telehealth: Payer: Self-pay | Admitting: Nurse Practitioner

## 2017-01-11 ENCOUNTER — Other Ambulatory Visit: Payer: Self-pay | Admitting: Nurse Practitioner

## 2017-01-11 MED ORDER — ALPRAZOLAM 0.5 MG PO TABS: 0.5000 mg | ORAL_TABLET | Freq: Two times a day (BID) | ORAL | 2 refills | Status: DC | PRN

## 2017-01-11 NOTE — Telephone Encounter (Signed)
Please send in new Rx; gave him a few extra for now until he gets adjusted. He can start new Rx today.

## 2017-01-11 NOTE — Telephone Encounter (Signed)
Patient advised Austin Morgan gave him a few extra for now until he gets adjusted. He can start new Rx today. Patient verbalized understanding.

## 2017-01-11 NOTE — Telephone Encounter (Signed)
Patient got a new Cpap machine and he is getting adjusted to it which is taking a while.  Patients spouse said that he has been having to take more Alprazolam than usual because of the trouble sleeping.  They are going out of town and patient wants to know if Hoyle Sauer can authorize an early refill?  Larene Pickett

## 2017-02-11 ENCOUNTER — Other Ambulatory Visit: Payer: Self-pay | Admitting: Family Medicine

## 2017-02-17 LAB — HEPATIC FUNCTION PANEL
ALT: 22 IU/L (ref 0–44)
AST: 18 IU/L (ref 0–40)
Albumin: 4.3 g/dL (ref 3.6–4.8)
Alkaline Phosphatase: 72 IU/L (ref 39–117)
Bilirubin Total: 0.3 mg/dL (ref 0.0–1.2)
Bilirubin, Direct: 0.11 mg/dL (ref 0.00–0.40)
Total Protein: 6.5 g/dL (ref 6.0–8.5)

## 2017-02-17 LAB — BASIC METABOLIC PANEL
BUN/Creatinine Ratio: 19 (ref 10–24)
BUN: 26 mg/dL (ref 8–27)
CO2: 22 mmol/L (ref 20–29)
Calcium: 9 mg/dL (ref 8.6–10.2)
Chloride: 100 mmol/L (ref 96–106)
Creatinine, Ser: 1.39 mg/dL — ABNORMAL HIGH (ref 0.76–1.27)
GFR calc Af Amer: 63 mL/min/{1.73_m2} (ref >59)
GFR calc non Af Amer: 55 mL/min/{1.73_m2} — ABNORMAL LOW (ref >59)
Glucose: 117 mg/dL — ABNORMAL HIGH (ref 65–99)
Potassium: 3.8 mmol/L (ref 3.5–5.2)
Sodium: 139 mmol/L (ref 134–144)

## 2017-02-17 LAB — LIPID PANEL
Chol/HDL Ratio: 4.1 ratio (ref 0.0–5.0)
Cholesterol, Total: 136 mg/dL (ref 100–199)
HDL: 33 mg/dL — ABNORMAL LOW (ref >39)
LDL Calculated: 52 mg/dL (ref 0–99)
Triglycerides: 254 mg/dL — ABNORMAL HIGH (ref 0–149)
VLDL Cholesterol Cal: 51 mg/dL — ABNORMAL HIGH (ref 5–40)

## 2017-02-17 LAB — MICROALBUMIN / CREATININE URINE RATIO
Creatinine, Urine: 245.4 mg/dL
Microalb/Creat Ratio: 4 mg/g creat (ref 0.0–30.0)
Microalbumin, Urine: 9.7 ug/mL

## 2017-02-17 LAB — URIC ACID: Uric Acid: 4.4 mg/dL (ref 3.7–8.6)

## 2017-02-19 ENCOUNTER — Ambulatory Visit: Payer: 59 | Admitting: Nurse Practitioner

## 2017-02-21 ENCOUNTER — Other Ambulatory Visit: Payer: Self-pay | Admitting: Family Medicine

## 2017-02-25 ENCOUNTER — Ambulatory Visit (INDEPENDENT_AMBULATORY_CARE_PROVIDER_SITE_OTHER): Payer: 59 | Admitting: Nurse Practitioner

## 2017-02-25 ENCOUNTER — Encounter: Payer: Self-pay | Admitting: Nurse Practitioner

## 2017-02-25 VITALS — BP 112/76 | Ht 67.0 in | Wt 202.0 lb

## 2017-02-25 DIAGNOSIS — Z23 Encounter for immunization: Secondary | ICD-10-CM | POA: Diagnosis not present

## 2017-02-25 DIAGNOSIS — M1A00X1 Idiopathic chronic gout, unspecified site, with tophus (tophi): Secondary | ICD-10-CM

## 2017-02-25 DIAGNOSIS — G4733 Obstructive sleep apnea (adult) (pediatric): Secondary | ICD-10-CM | POA: Diagnosis not present

## 2017-02-25 DIAGNOSIS — E119 Type 2 diabetes mellitus without complications: Secondary | ICD-10-CM | POA: Diagnosis not present

## 2017-02-25 DIAGNOSIS — E781 Pure hyperglyceridemia: Secondary | ICD-10-CM | POA: Diagnosis not present

## 2017-02-25 LAB — POCT GLYCOSYLATED HEMOGLOBIN (HGB A1C): Hemoglobin A1C: 5.3

## 2017-02-25 MED ORDER — ATORVASTATIN CALCIUM 40 MG PO TABS: 40.0000 mg | ORAL_TABLET | Freq: Every day | ORAL | 0 refills | Status: DC

## 2017-02-25 MED ORDER — CYCLOBENZAPRINE HCL 10 MG PO TABS: ORAL_TABLET | ORAL | 2 refills | Status: DC

## 2017-02-25 MED ORDER — ATORVASTATIN CALCIUM 40 MG PO TABS: 40.0000 mg | ORAL_TABLET | Freq: Every day | ORAL | 1 refills | Status: DC

## 2017-02-25 NOTE — Patient Instructions (Addendum)
Stop Fenofibrate Restart Atorvastatin Omega 3 try to increase to 2 twice per day (1000 mg)

## 2017-02-26 ENCOUNTER — Encounter: Payer: Self-pay | Admitting: Nurse Practitioner

## 2017-02-26 DIAGNOSIS — G4733 Obstructive sleep apnea (adult) (pediatric): Secondary | ICD-10-CM | POA: Insufficient documentation

## 2017-02-26 NOTE — Progress Notes (Signed)
Subjective:  Presents for recheck on his diabetes and to review his recent labs. No CP/ischemic type pain or SOB. Ran out of Atorvastatin about 2 weeks. Has stopped Fenofibrate. Compliant with meds. Gout stable. Started a new job which has increased his physical activity. Doing better emotionally. Has cut back on alcohol use. Struggled for some time after losing his job. Doing well with his diet. Has not been wearing his CPAP due to difficulty tolerating both nasal pillows and mask.   Objective:   BP 112/76   Ht 5\' 7"  (1.702 m)   Wt 202 lb (91.6 kg)   BMI 31.64 kg/m  NAD. Alert, oriented. Lungs clear. Heart RRR. LE: no edema.  Diabetic Foot Exam - Simple   Simple Foot Form Diabetic Foot exam was performed with the following findings:  Yes 02/25/2017  3:00 PM  Visual Inspection No deformities, no ulcerations, no other skin breakdown bilaterally:  Yes Sensation Testing Intact to touch and monofilament testing bilaterally:  Yes Pulse Check See comments:  Yes Comments DP pulses present bilat; toes warm with normal cap refill    Recent Results (from the past 2160 hour(s))  Lipid panel     Status: Abnormal   Collection Time: 02/16/17  8:11 AM  Result Value Ref Range   Cholesterol, Total 136 100 - 199 mg/dL   Triglycerides 254 (H) 0 - 149 mg/dL   HDL 33 (L) >39 mg/dL   VLDL Cholesterol Cal 51 (H) 5 - 40 mg/dL   LDL Calculated 52 0 - 99 mg/dL   Chol/HDL Ratio 4.1 0.0 - 5.0 ratio    Comment:                                   T. Chol/HDL Ratio                                             Men  Women                               1/2 Avg.Risk  3.4    3.3                                   Avg.Risk  5.0    4.4                                2X Avg.Risk  9.6    7.1                                3X Avg.Risk 23.4   11.0   Hepatic function panel     Status: None   Collection Time: 02/16/17  8:11 AM  Result Value Ref Range   Total Protein 6.5 6.0 - 8.5 g/dL   Albumin 4.3 3.6 - 4.8 g/dL    Bilirubin Total 0.3 0.0 - 1.2 mg/dL   Bilirubin, Direct 0.11 0.00 - 0.40 mg/dL   Alkaline Phosphatase 72 39 - 117 IU/L   AST 18 0 - 40 IU/L   ALT 22 0 - 44  IU/L  Basic metabolic panel     Status: Abnormal   Collection Time: 02/16/17  8:11 AM  Result Value Ref Range   Glucose 117 (H) 65 - 99 mg/dL   BUN 26 8 - 27 mg/dL   Creatinine, Ser 1.39 (H) 0.76 - 1.27 mg/dL   GFR calc non Af Amer 55 (L) >59 mL/min/1.73   GFR calc Af Amer 63 >59 mL/min/1.73   BUN/Creatinine Ratio 19 10 - 24   Sodium 139 134 - 144 mmol/L   Potassium 3.8 3.5 - 5.2 mmol/L   Chloride 100 96 - 106 mmol/L   CO2 22 20 - 29 mmol/L   Calcium 9.0 8.6 - 10.2 mg/dL  Uric acid     Status: None   Collection Time: 02/16/17  8:11 AM  Result Value Ref Range   Uric Acid 4.4 3.7 - 8.6 mg/dL    Comment:            Therapeutic target for gout patients: <6.0  Microalbumin / creatinine urine ratio     Status: None   Collection Time: 02/16/17  8:11 AM  Result Value Ref Range   Creatinine, Urine 245.4 Not Estab. mg/dL   Albumin, Urine 9.7 Not Estab. ug/mL   Microalb/Creat Ratio 4.0 0.0 - 30.0 mg/g creat  POCT glycosylated hemoglobin (Hb A1C)     Status: None   Collection Time: 02/25/17  3:08 PM  Result Value Ref Range   Hemoglobin A1C 5.3    Reviewed with patient.   Assessment:   Problem List Items Addressed This Visit      Respiratory   Obstructive sleep apnea     Endocrine   Diabetes mellitus without complication ([place]) - Primary (Chronic)   Relevant Medications   atorvastatin (LIPITOR) 40 MG tablet   atorvastatin (LIPITOR) 40 MG tablet   Other Relevant Orders   POCT glycosylated hemoglobin (Hb A1C) (Completed)     Other   Gout   Hypertriglyceridemia (Chronic)   Relevant Medications   atorvastatin (LIPITOR) 40 MG tablet   atorvastatin (LIPITOR) 40 MG tablet    Other Visit Diagnoses    Need for vaccination       Relevant Orders   Flu Vaccine QUAD 6+ mos PF IM (Fluarix Quad PF) (Completed)       Plan:    Meds ordered this encounter  Medications  . Coenzyme Q10 (COQ10 PO)    Sig: Take by mouth.  . cyclobenzaprine (FLEXERIL) 10 MG tablet    Sig: TAKE ONE TABLET BY MOUTH 3 TIMES DAILY AS NEEDED FOR MUSCLE SPASM(S).    Dispense:  30 tablet    Refill:  2    Order Specific Question:   Supervising Provider    Answer:   Mikey Kirschner [[id number]]  . atorvastatin (LIPITOR) 40 MG tablet    Sig: Take 1 tablet (40 mg total) by mouth daily.    Dispense:  30 tablet    Refill:  0    Order Specific Question:   Supervising Provider    Answer:   Mikey Kirschner [[id number]]  . atorvastatin (LIPITOR) 40 MG tablet    Sig: Take 1 tablet (40 mg total) by mouth daily.    Dispense:  90 tablet    Refill:  1    Order Specific Question:   Supervising Provider    Answer:   Mikey Kirschner [[id number]]   Increase Omega 3 supplement. Continue other meds as directed. DC Fenofibrate and continue Atorvastatin. Will check on  options for CPAP.  Defers low dose CT for lung cancer screening due to costs.  Return in about 6 months (around 08/25/2017) for recheck.  25 minutes was spent with the patient. Greater than half the time was spent in discussion and answering questions and counseling regarding the issues that the patient came in for today.

## 2017-04-15 ENCOUNTER — Other Ambulatory Visit: Payer: Self-pay | Admitting: Nurse Practitioner

## 2017-05-03 ENCOUNTER — Other Ambulatory Visit: Payer: Self-pay | Admitting: Family Medicine

## 2017-05-27 ENCOUNTER — Other Ambulatory Visit: Payer: Self-pay | Admitting: Family Medicine

## 2017-07-02 ENCOUNTER — Encounter: Payer: Self-pay | Admitting: Family Medicine

## 2017-07-02 ENCOUNTER — Telehealth: Payer: Self-pay | Admitting: Nurse Practitioner

## 2017-07-02 NOTE — Telephone Encounter (Signed)
I called and left a message asked that he r/c. 

## 2017-07-02 NOTE — Telephone Encounter (Signed)
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  Patients wife [name] is concerned that this will not be good for him and will throw him off schedule, especially with his diabetes.  She is requesting Dr. Nicki Reaper to write a letter letting his work know that third shift is not recommended for him.  She is requesting this ASAP because his work is trying to make him do this this coming up weekend.

## 2017-07-02 NOTE — Telephone Encounter (Signed)
I will go ahead and do the letter.  It would be up to his workplace to work with him regarding this hopefully they will honor the letter.  Patient may come by to pick up a letter from Austin Morgan

## 2017-07-09 NOTE — Telephone Encounter (Signed)
Message sent to Erica .

## 2017-07-18 ENCOUNTER — Other Ambulatory Visit: Payer: Self-pay | Admitting: Family Medicine

## 2017-07-30 ENCOUNTER — Other Ambulatory Visit: Payer: Self-pay | Admitting: Nurse Practitioner

## 2017-08-02 ENCOUNTER — Other Ambulatory Visit: Payer: Self-pay | Admitting: Nurse Practitioner

## 2017-08-22 ENCOUNTER — Other Ambulatory Visit: Payer: Self-pay | Admitting: Nurse Practitioner

## 2017-08-22 ENCOUNTER — Other Ambulatory Visit: Payer: Self-pay | Admitting: Family Medicine

## 2017-08-23 ENCOUNTER — Other Ambulatory Visit: Payer: Self-pay | Admitting: Nurse Practitioner

## 2017-08-23 ENCOUNTER — Telehealth: Payer: Self-pay | Admitting: Nurse Practitioner

## 2017-08-23 DIAGNOSIS — E119 Type 2 diabetes mellitus without complications: Secondary | ICD-10-CM

## 2017-08-23 DIAGNOSIS — E785 Hyperlipidemia, unspecified: Secondary | ICD-10-CM

## 2017-08-23 NOTE — Telephone Encounter (Signed)
A1C, lipid and liver tests. Thanks.

## 2017-08-23 NOTE — Telephone Encounter (Signed)
Pt is requesting lab orders to be sent over for an upcoming appt with Hoyle Sauer. Last labs per epic were: micro albumin,uric acid,bmp,hepatic and lipid on 02/16/2017. Pt is aware Hoyle Sauer is out till Monday.

## 2017-08-23 NOTE — Telephone Encounter (Signed)
Labs placed in Epic and patient notified

## 2017-08-25 LAB — HEPATIC FUNCTION PANEL
ALT: 23 IU/L (ref 0–44)
AST: 16 IU/L (ref 0–40)
Albumin: 4.3 g/dL (ref 3.6–4.8)
Alkaline Phosphatase: 86 IU/L (ref 39–117)
Bilirubin Total: 0.5 mg/dL (ref 0.0–1.2)
Bilirubin, Direct: 0.19 mg/dL (ref 0.00–0.40)
Total Protein: 6.7 g/dL (ref 6.0–8.5)

## 2017-08-25 LAB — HEMOGLOBIN A1C
Est. average glucose Bld gHb Est-mCnc: 123 mg/dL
Hgb A1c MFr Bld: 5.9 % — ABNORMAL HIGH (ref 4.8–5.6)

## 2017-08-25 LAB — LIPID PANEL
Chol/HDL Ratio: 2.5 ratio (ref 0.0–5.0)
Cholesterol, Total: 112 mg/dL (ref 100–199)
HDL: 45 mg/dL (ref >39)
LDL Calculated: 27 mg/dL (ref 0–99)
Triglycerides: 199 mg/dL — ABNORMAL HIGH (ref 0–149)
VLDL Cholesterol Cal: 40 mg/dL (ref 5–40)

## 2017-08-26 ENCOUNTER — Ambulatory Visit: Payer: 59 | Admitting: Nurse Practitioner

## 2017-09-09 ENCOUNTER — Encounter: Payer: Self-pay | Admitting: Nurse Practitioner

## 2017-09-09 ENCOUNTER — Ambulatory Visit (INDEPENDENT_AMBULATORY_CARE_PROVIDER_SITE_OTHER): Payer: 59 | Admitting: Nurse Practitioner

## 2017-09-09 VITALS — BP 116/72 | Ht 67.0 in | Wt 195.0 lb

## 2017-09-09 DIAGNOSIS — E119 Type 2 diabetes mellitus without complications: Secondary | ICD-10-CM

## 2017-09-09 DIAGNOSIS — E781 Pure hyperglyceridemia: Secondary | ICD-10-CM | POA: Diagnosis not present

## 2017-09-09 DIAGNOSIS — I10 Essential (primary) hypertension: Secondary | ICD-10-CM

## 2017-09-09 MED ORDER — ALPRAZOLAM 0.5 MG PO TABS: ORAL_TABLET | ORAL | 5 refills | Status: DC

## 2017-09-09 NOTE — Progress Notes (Signed)
Subjective: Presents for recheck on his high blood pressure diabetes and cholesterol.  Compliant with medications.  Has started a regular activity.  Doing very well with his diet.  Has quit alcohol intake for 1 month.  Has remained clean and sober.  Denies CP/ischemic type pain or SOB. No visual changes. No difficulty speaking or swallowing. No numbness or weakness of the face, arms or legs.  Stress level much better at this time.  Still takes occasional Xanax.  Has quit smoking.  Has cut back his omega-3 supplement to once daily versus twice a day.   Objective:   BP 116/72   Ht 5\' 7"  (1.702 m)   Wt 195 lb (88.5 kg)   BMI 30.54 kg/m  NAD.  Alert, oriented.  Cheerful calm affect.  Lungs clear.  Heart regular rate and rhythm.  Carotids no bruits or thrills.  Lower extremities no edema. Results for orders placed or performed in visit on 08/23/17  Hepatic function panel  Result Value Ref Range   Total Protein 6.7 6.0 - 8.5 g/dL   Albumin 4.3 3.6 - 4.8 g/dL   Bilirubin Total 0.5 0.0 - 1.2 mg/dL   Bilirubin, Direct 0.19 0.00 - 0.40 mg/dL   Alkaline Phosphatase 86 39 - 117 IU/L   AST 16 0 - 40 IU/L   ALT 23 0 - 44 IU/L  Lipid Profile  Result Value Ref Range   Cholesterol, Total 112 100 - 199 mg/dL   Triglycerides 199 (H) 0 - 149 mg/dL   HDL 45 >39 mg/dL   VLDL Cholesterol Cal 40 5 - 40 mg/dL   LDL Calculated 27 0 - 99 mg/dL   Chol/HDL Ratio 2.5 0.0 - 5.0 ratio  HgB A1c  Result Value Ref Range   Hgb A1c MFr Bld 5.9 (H) 4.8 - 5.6 %   Est. average glucose Bld gHb Est-mCnc 123 mg/dL   Labs reviewed with patient.  Assessment:   Problem List Items Addressed This Visit      Cardiovascular and Mediastinum   Hypertension - Primary (Chronic)     Endocrine   Diabetes mellitus without complication (HCC) (Chronic)   Relevant Orders   Hemoglobin A1c     Other   Hypertriglyceridemia (Chronic)   Relevant Orders   Lipid panel       Plan:   Meds ordered this encounter  Medications  .  ALPRAZolam (XANAX) 0.5 MG tablet    Sig: TAKE (1) TABLET BY MOUTH TWICE DAILY AS NEEDED FOR ANXIETY.    Dispense:  45 tablet    Refill:  5    Order Specific Question:   Supervising Provider    Answer:   Mikey Kirschner [[id number]]   Questions whether he can stop his glipizide.  Recommend holding on medicine and reducing his Lipitor to half a dose or 20 mg.  Restart twice a day dosing on omega-3 supplement.  Recheck labs in 3 months, further changes at that time. Return in about 6 months (around 03/11/2018) for recheck. 25 minutes was spent with the patient.  This statement verifies that 25 minutes was indeed spent with the patient. Greater than half the time was spent in discussion, counseling and answering questions  regarding the issues that the patient came in for today as reflected in the diagnosis (s) please refer to documentation for further details.

## 2017-09-09 NOTE — Patient Instructions (Addendum)
Stop Glipizide for 3 months. Cut Lipitor dose in half. Restart Omega 3 twice a day Repeat labs in 3 months.

## 2017-09-30 ENCOUNTER — Other Ambulatory Visit: Payer: Self-pay | Admitting: Family Medicine

## 2017-12-11 ENCOUNTER — Other Ambulatory Visit: Payer: Self-pay | Admitting: Family Medicine

## 2018-01-01 ENCOUNTER — Telehealth: Payer: Self-pay | Admitting: Nurse Practitioner

## 2018-01-01 DIAGNOSIS — M1A00X1 Idiopathic chronic gout, unspecified site, with tophus (tophi): Secondary | ICD-10-CM

## 2018-01-01 DIAGNOSIS — E781 Pure hyperglyceridemia: Secondary | ICD-10-CM

## 2018-01-01 DIAGNOSIS — I10 Essential (primary) hypertension: Secondary | ICD-10-CM

## 2018-01-01 DIAGNOSIS — E119 Type 2 diabetes mellitus without complications: Secondary | ICD-10-CM

## 2018-01-01 NOTE — Telephone Encounter (Signed)
Patient has an appointment on 01/03/18 with Hoyle Sauer.  He is requesting orders for labs to have completely tomorrow.

## 2018-01-02 NOTE — Telephone Encounter (Signed)
Met 7, lipid, liver, A1c, uric acid level and urine micro protein ratio

## 2018-01-02 NOTE — Telephone Encounter (Signed)
Blood work ordered in Epic. Patient notified. 

## 2018-01-03 ENCOUNTER — Other Ambulatory Visit: Payer: Self-pay | Admitting: Family Medicine

## 2018-01-03 ENCOUNTER — Ambulatory Visit (INDEPENDENT_AMBULATORY_CARE_PROVIDER_SITE_OTHER): Payer: 59 | Admitting: Nurse Practitioner

## 2018-01-03 ENCOUNTER — Encounter: Payer: Self-pay | Admitting: Nurse Practitioner

## 2018-01-03 VITALS — BP 136/86 | Ht 67.0 in | Wt 187.4 lb

## 2018-01-03 DIAGNOSIS — F419 Anxiety disorder, unspecified: Secondary | ICD-10-CM | POA: Diagnosis not present

## 2018-01-03 DIAGNOSIS — F32A Depression, unspecified: Secondary | ICD-10-CM

## 2018-01-03 DIAGNOSIS — I10 Essential (primary) hypertension: Secondary | ICD-10-CM

## 2018-01-03 DIAGNOSIS — F1010 Alcohol abuse, uncomplicated: Secondary | ICD-10-CM

## 2018-01-03 DIAGNOSIS — M659 Synovitis and tenosynovitis, unspecified: Secondary | ICD-10-CM | POA: Diagnosis not present

## 2018-01-03 DIAGNOSIS — F329 Major depressive disorder, single episode, unspecified: Secondary | ICD-10-CM

## 2018-01-03 MED ORDER — BUPROPION HCL ER (XL) 150 MG PO TB24: 150.0000 mg | ORAL_TABLET | Freq: Every day | ORAL | 2 refills | Status: DC

## 2018-01-04 ENCOUNTER — Encounter: Payer: Self-pay | Admitting: Nurse Practitioner

## 2018-01-04 DIAGNOSIS — F1010 Alcohol abuse, uncomplicated: Secondary | ICD-10-CM | POA: Insufficient documentation

## 2018-01-04 NOTE — Progress Notes (Signed)
Subjective: Presents with his wife for recheck on his hypertension.  Was not able to get his blood work done before visit, plans to do this in the morning.  Has struggled some with his depression lately, has had episodes of alcohol intake.  His wife is requesting a trial of Wellbutrin since he has done extremely well on Chantix in the past for smoking cessation without side effects.  Gout has been stable.  Has noticed extreme tenderness in both thumbs for the past 2 to 3 months, describes as a dull ache.  No decreased hand strength but difficulty using his thumbs.  Started on the right hand, is now worse on the left side.  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 Denies CP/ischemic type pain or SOB. No visual changes. No difficulty speaking or swallowing. No numbness or weakness of the face, arms or legs.    Objective:   BP 136/86   Ht 5\' 7"  (1.702 m)   Wt 187 lb 6.4 oz (85 kg)   BMI 29.35 kg/m  NAD.  Alert, oriented.  Calm affect.  Thoughts logical coherent and relevant.  Dressed appropriately.  Making good eye contact.  Lungs clear.  Heart regular rate and rhythm.  Lower extremities no edema.  Distinct tenderness noted at the bases of both thumbs, tenderness with range of motion.  Hand strength 5+ bilaterally.  Sensation grossly intact.  Radial pulses strong.  Assessment:   Problem List Items Addressed This Visit      Cardiovascular and Mediastinum   Hypertension - Primary (Chronic)     Other   Alcohol abuse   Anxiety and depression   Relevant Medications   buPROPion (WELLBUTRIN XL) 150 MG 24 hr tablet    Other Visit Diagnoses    Tenosynovitis of thumb           Plan:   Meds ordered this encounter  Medications  . buPROPion (WELLBUTRIN XL) 150 MG 24 hr tablet    Sig: Take 1 tablet (150 mg total) by mouth daily.    Dispense:  30 tablet    Refill:  2    Order Specific Question:   Supervising Provider     Answer:   Mikey Kirschner [[id number]]   Routine labs pending. Start Wellbutrin as directed, DC med if any problems.  Let the office know in a few weeks if dosage needs to be increased.  Advised patient not to drink any alcohol if he is taking bupropion due to the risk of seizures.  Verbalizes understanding. Given a prescription for a left wrist brace for thumb tendinitis.  Continue naproxen as directed for pain.  Ice or heat applications.  If symptoms persist, recommend referral to the hand specialist in [place].  Patient will contact us if further intervention is needed. Return in about 6 months (around 07/06/2018) for recheck.

## 2018-01-05 LAB — HEPATIC FUNCTION PANEL
ALT: 89 IU/L — ABNORMAL HIGH (ref 0–44)
AST: 140 IU/L — ABNORMAL HIGH (ref 0–40)
Albumin: 4.3 g/dL (ref 3.6–4.8)
Alkaline Phosphatase: 83 IU/L (ref 39–117)
Bilirubin Total: 0.4 mg/dL (ref 0.0–1.2)
Bilirubin, Direct: 0.2 mg/dL (ref 0.00–0.40)
Total Protein: 6.7 g/dL (ref 6.0–8.5)

## 2018-01-05 LAB — BASIC METABOLIC PANEL
BUN/Creatinine Ratio: 20 (ref 10–24)
BUN: 22 mg/dL (ref 8–27)
CO2: 24 mmol/L (ref 20–29)
Calcium: 8.5 mg/dL — ABNORMAL LOW (ref 8.6–10.2)
Chloride: 98 mmol/L (ref 96–106)
Creatinine, Ser: 1.09 mg/dL (ref 0.76–1.27)
GFR calc Af Amer: 84 mL/min/{1.73_m2} (ref >59)
GFR calc non Af Amer: 73 mL/min/{1.73_m2} (ref >59)
Glucose: 116 mg/dL — ABNORMAL HIGH (ref 65–99)
Potassium: 4.2 mmol/L (ref 3.5–5.2)
Sodium: 144 mmol/L (ref 134–144)

## 2018-01-05 LAB — URIC ACID: Uric Acid: 5 mg/dL (ref 3.7–8.6)

## 2018-01-05 LAB — MICROALBUMIN / CREATININE URINE RATIO
Creatinine, Urine: 242.8 mg/dL
Microalb/Creat Ratio: 129.6 mg/g creat — ABNORMAL HIGH (ref 0.0–30.0)
Microalbumin, Urine: 314.7 ug/mL

## 2018-01-05 LAB — LIPID PANEL
Chol/HDL Ratio: 4.9 ratio (ref 0.0–5.0)
Cholesterol, Total: 191 mg/dL (ref 100–199)
HDL: 39 mg/dL — ABNORMAL LOW (ref >39)
Triglycerides: 990 mg/dL (ref 0–149)

## 2018-01-05 LAB — HEMOGLOBIN A1C
Est. average glucose Bld gHb Est-mCnc: 140 mg/dL
Hgb A1c MFr Bld: 6.5 % — ABNORMAL HIGH (ref 4.8–5.6)

## 2018-01-06 ENCOUNTER — Encounter: Payer: Self-pay | Admitting: Nurse Practitioner

## 2018-01-06 ENCOUNTER — Other Ambulatory Visit: Payer: Self-pay | Admitting: Family Medicine

## 2018-01-06 DIAGNOSIS — Z79899 Other long term (current) drug therapy: Secondary | ICD-10-CM

## 2018-01-06 DIAGNOSIS — E781 Pure hyperglyceridemia: Secondary | ICD-10-CM

## 2018-01-07 ENCOUNTER — Other Ambulatory Visit: Payer: Self-pay | Admitting: Nurse Practitioner

## 2018-01-08 ENCOUNTER — Other Ambulatory Visit: Payer: Self-pay | Admitting: Nurse Practitioner

## 2018-02-06 ENCOUNTER — Other Ambulatory Visit: Payer: Self-pay | Admitting: Family Medicine

## 2018-03-14 ENCOUNTER — Ambulatory Visit: Payer: 59 | Admitting: Nurse Practitioner

## 2018-03-20 ENCOUNTER — Telehealth: Payer: Self-pay | Admitting: Family Medicine

## 2018-03-20 ENCOUNTER — Other Ambulatory Visit: Payer: Self-pay | Admitting: *Deleted

## 2018-03-20 MED ORDER — SCOPOLAMINE 1 MG/3DAYS TD PT72: 1.0000 | MEDICATED_PATCH | TRANSDERMAL | 0 refills | Status: DC

## 2018-03-20 NOTE — Telephone Encounter (Signed)
Pt going on cruise requesting nausea patches. Advise.

## 2018-03-20 NOTE — Telephone Encounter (Signed)
Med sent to pharm. Pt notified.  

## 2018-03-20 NOTE — Telephone Encounter (Signed)
belmont

## 2018-03-20 NOTE — Telephone Encounter (Signed)
Left message to return call. Need to know which pharm to send med to

## 2018-04-08 ENCOUNTER — Telehealth: Payer: Self-pay | Admitting: Family Medicine

## 2018-04-08 ENCOUNTER — Other Ambulatory Visit: Payer: Self-pay | Admitting: *Deleted

## 2018-04-08 MED ORDER — ALPRAZOLAM 0.5 MG PO TABS: ORAL_TABLET | ORAL | 2 refills | Status: DC

## 2018-04-08 NOTE — Telephone Encounter (Signed)
May have this +2 refill will need follow-up by the end of the year for review of medications refills etc.

## 2018-04-08 NOTE — Telephone Encounter (Signed)
Pt requesting refill on ALPRAZolam (XANAX) 0.5 MG tablet. Please send to [place], [place]

## 2018-04-08 NOTE — Telephone Encounter (Signed)
Script printed await signature

## 2018-04-08 NOTE — Telephone Encounter (Signed)
Script faxed. Pt notified.

## 2018-04-18 ENCOUNTER — Other Ambulatory Visit: Payer: Self-pay | Admitting: Family Medicine

## 2018-05-05 ENCOUNTER — Other Ambulatory Visit: Payer: Self-pay | Admitting: *Deleted

## 2018-05-05 ENCOUNTER — Telehealth: Payer: Self-pay | Admitting: *Deleted

## 2018-05-05 MED ORDER — ONDANSETRON 8 MG PO TBDP: 8.0000 mg | ORAL_TABLET | Freq: Three times a day (TID) | ORAL | 0 refills | Status: DC | PRN

## 2018-05-05 NOTE — Telephone Encounter (Signed)
Zofran 8 mg ODT, #12, 1 taken 3 times daily as needed nausea, this needs to clear up if not needs further evaluation, if this does not help may well need to be seen in ER since been vomiting for this length of time

## 2018-05-05 NOTE — Telephone Encounter (Signed)
Pt's wife called states pt started vomiting at 3pm on Saturday and has not stopped. No other symptoms. No fever, no abdominal pain, no diarrhea. States he cannot come in today because he cant stop vomiting. everytime he eats or drinks it comes back up. Tried scope patch and it helped for about 4 hours. Can something be called in for vomiting or does he need to be seen? Belmont to be delivered.  [phone].

## 2018-05-05 NOTE — Telephone Encounter (Signed)
Discussed with pt's wife. Med sent to pharm.

## 2018-05-06 ENCOUNTER — Telehealth: Payer: Self-pay | Admitting: Family Medicine

## 2018-05-06 ENCOUNTER — Other Ambulatory Visit: Payer: Self-pay | Admitting: Family Medicine

## 2018-05-06 MED ORDER — SUCRALFATE 1 GM/10ML PO SUSP: 1.0000 g | Freq: Three times a day (TID) | ORAL | 0 refills | Status: DC

## 2018-05-06 NOTE — Telephone Encounter (Signed)
Discussed with pt. Pt verbalized understanding.  °

## 2018-05-06 NOTE — Telephone Encounter (Signed)
Left message to return call 

## 2018-05-06 NOTE — Telephone Encounter (Signed)
Update: pt is no longer vomiting, pt held down breakfast this morning, but is experiencing, pt's wife would like an prescription for Carafate slury for pt. Advise.   [place], [place]

## 2018-05-06 NOTE — Telephone Encounter (Signed)
The prescription was sent into [facility]. If the patient is not getting back to his usual self over the next 48 hours I do recommend office visit Office visit sooner if problems

## 2018-05-06 NOTE — Telephone Encounter (Signed)
Edit to last message "but is experiencing weakness"

## 2018-05-27 ENCOUNTER — Other Ambulatory Visit: Payer: Self-pay

## 2018-05-27 MED ORDER — METFORMIN HCL 500 MG PO TABS: 500.0000 mg | ORAL_TABLET | Freq: Two times a day (BID) | ORAL | 1 refills | Status: DC

## 2018-05-27 MED ORDER — GLIPIZIDE 5 MG PO TABS: ORAL_TABLET | ORAL | 1 refills | Status: DC

## 2018-05-27 MED ORDER — HYDROCHLOROTHIAZIDE 25 MG PO TABS: 25.0000 mg | ORAL_TABLET | Freq: Every day | ORAL | 1 refills | Status: DC

## 2018-05-27 MED ORDER — LISINOPRIL 5 MG PO TABS: 5.0000 mg | ORAL_TABLET | Freq: Every day | ORAL | 1 refills | Status: DC

## 2018-05-30 ENCOUNTER — Other Ambulatory Visit: Payer: Self-pay | Admitting: *Deleted

## 2018-05-30 MED ORDER — ATORVASTATIN CALCIUM 40 MG PO TABS: 40.0000 mg | ORAL_TABLET | Freq: Every day | ORAL | 0 refills | Status: DC

## 2018-06-10 ENCOUNTER — Other Ambulatory Visit: Payer: Self-pay | Admitting: Family Medicine

## 2018-06-12 ENCOUNTER — Other Ambulatory Visit: Payer: Self-pay | Admitting: Family Medicine

## 2018-06-12 NOTE — Telephone Encounter (Signed)
This +6 refills

## 2018-07-26 ENCOUNTER — Other Ambulatory Visit: Payer: Self-pay | Admitting: Family Medicine

## 2018-07-28 NOTE — Telephone Encounter (Signed)
May have 90 days only Needs follow-up office visit and lab work Patient needs to schedule office visit

## 2018-08-07 ENCOUNTER — Other Ambulatory Visit: Payer: Self-pay | Admitting: Family Medicine

## 2018-08-07 NOTE — Telephone Encounter (Signed)
1 refill only patient needs office visit Do it only for 25 tablets patient has been told before to do an office visit and he is not coming in therefore we will reduce the the number of medicine Please send him a my chart message that we are reducing his refills until he follows up and failure to follow-up will mean that we will not do further refills

## 2018-08-08 ENCOUNTER — Encounter: Payer: Self-pay | Admitting: *Deleted

## 2018-08-22 ENCOUNTER — Other Ambulatory Visit: Payer: Self-pay | Admitting: Family Medicine

## 2018-10-08 ENCOUNTER — Other Ambulatory Visit: Payer: Self-pay | Admitting: Family Medicine

## 2018-10-20 ENCOUNTER — Other Ambulatory Visit: Payer: Self-pay | Admitting: Family Medicine

## 2018-10-21 NOTE — Telephone Encounter (Signed)
Needs to schedule virtual visit May been have 1 refill

## 2018-10-21 NOTE — Telephone Encounter (Signed)
Pt states he did not request this med and he does not need any refills at this time. He wanted to refuse this refill. I still told pt he needed virtual visit for refills and he declined to make one at this time. States he will call back to schedule

## 2018-10-21 NOTE — Telephone Encounter (Signed)
Left message to return call 

## 2018-11-05 ENCOUNTER — Other Ambulatory Visit: Payer: Self-pay | Admitting: Family Medicine

## 2018-11-06 NOTE — Telephone Encounter (Signed)
this patient is a nice patient He does not come much I recommend we schedule office visit either with myself or with Hoyle Sauer in June Then he can have a refill He will need to do lab work if he is willing we can do it before the visit or at the time of the visit

## 2018-11-07 NOTE — Telephone Encounter (Signed)
Called pt to schedule visit, pt states he does not need this refill, the mail order pharmacy sends this without his request  Pt feels he's doing ok and does not need to be seen at this time  Explained our cleaning protocol and that it is safe to come in when he's ready, pt states he will call us when he's ready but at this time he does not need refills on any of his medicines to please disregard the refill request

## 2018-11-19 ENCOUNTER — Telehealth: Payer: Self-pay | Admitting: Family Medicine

## 2018-11-21 NOTE — Telephone Encounter (Signed)
30-day prescriptions only for all of these Must schedule follow-up office visit either with myself or Hoyle Sauer for this month May be a virtual visit

## 2018-11-24 NOTE — Telephone Encounter (Signed)
Pt states he's been off Glipizide since last summer  Pt also states he does not need any refills at this time.  Pt will no longer be using mail order due to they send him refills before needed.  States he's got about 180 days left of at least one of his meds.  He will call us when he's got 30 days of meds left to schedule an appointment so he can get refills at that time and he will be using local pharmacy

## 2018-12-01 ENCOUNTER — Other Ambulatory Visit: Payer: Self-pay | Admitting: Family Medicine

## 2018-12-02 NOTE — Telephone Encounter (Signed)
This gentleman is had numerous notifications that he needs to set up a virtual visit Please have pharmacy deny the refill he will need to call us set up a virtual visit then he can have a refill

## 2018-12-09 ENCOUNTER — Telehealth: Payer: Self-pay | Admitting: Family Medicine

## 2018-12-09 NOTE — Telephone Encounter (Signed)
Pharmacy requesting refills on Lisinopril 5 mg tablets; Glucophage 50 mg tablets and HCTZ 25 mg tablets. Pt last seen 01/03/18 for HTN

## 2018-12-09 NOTE — Telephone Encounter (Signed)
He may have 30 days This patient does need a virtual visit if not already scheduled he needs to schedule before we will do more refills He can do a follow-up visit with myself or Hoyle Sauer

## 2018-12-11 NOTE — Telephone Encounter (Signed)
Pt states he does not need these filled. Pt states he has about 3 months worth of meds. Pt states he has told us before to not fill them. Pt will call to set up appt once he is running low on medication.

## 2019-01-01 ENCOUNTER — Encounter: Payer: Self-pay | Admitting: Gastroenterology

## 2019-02-17 ENCOUNTER — Other Ambulatory Visit: Payer: Self-pay | Admitting: *Deleted

## 2019-02-17 DIAGNOSIS — Z20822 Contact with and (suspected) exposure to covid-19: Secondary | ICD-10-CM

## 2019-02-18 LAB — NOVEL CORONAVIRUS, NAA: SARS-CoV-2, NAA: NOT DETECTED

## 2019-03-03 ENCOUNTER — Telehealth: Payer: Self-pay | Admitting: Nurse Practitioner

## 2019-03-03 DIAGNOSIS — Z125 Encounter for screening for malignant neoplasm of prostate: Secondary | ICD-10-CM

## 2019-03-03 DIAGNOSIS — E119 Type 2 diabetes mellitus without complications: Secondary | ICD-10-CM

## 2019-03-03 DIAGNOSIS — M1A00X1 Idiopathic chronic gout, unspecified site, with tophus (tophi): Secondary | ICD-10-CM

## 2019-03-03 DIAGNOSIS — I10 Essential (primary) hypertension: Secondary | ICD-10-CM

## 2019-03-03 DIAGNOSIS — Z114 Encounter for screening for human immunodeficiency virus [HIV]: Secondary | ICD-10-CM

## 2019-03-03 DIAGNOSIS — Z1159 Encounter for screening for other viral diseases: Secondary | ICD-10-CM

## 2019-03-03 NOTE — Telephone Encounter (Signed)
Lipid, liver, metabolic 7, uric acid, PSA, urine ACR, HIV antibody, hepatitis C antibody  Hyperlipidemia, diabetes, hypertension, gout, screening prostate cancer, screening per CDC guidelines

## 2019-03-03 NOTE — Telephone Encounter (Signed)
Last labs completed on 01/06/18 uric acid, urine microalbumin, a1c, bmet, lipid and hepatic. Please advise. Thank you .

## 2019-03-03 NOTE — Telephone Encounter (Signed)
Lab orders placed and pt is aware 

## 2019-03-03 NOTE — Telephone Encounter (Signed)
Patient is scheduled for a 6 mos f/u with Hoyle Sauer on 03/20/19 and said he usually has bloodwork done in advance.  Would like orders put in for [facility].

## 2019-03-05 LAB — HM DIABETES EYE EXAM

## 2019-03-19 LAB — MICROALBUMIN / CREATININE URINE RATIO
Creatinine, Urine: 324 mg/dL
Microalb/Creat Ratio: 117 mg/g creat — ABNORMAL HIGH (ref 0–29)
Microalbumin, Urine: 379.1 ug/mL

## 2019-03-19 LAB — BASIC METABOLIC PANEL
BUN/Creatinine Ratio: 16 (ref 10–24)
BUN: 18 mg/dL (ref 8–27)
CO2: 28 mmol/L (ref 20–29)
Calcium: 9.6 mg/dL (ref 8.6–10.2)
Chloride: 94 mmol/L — ABNORMAL LOW (ref 96–106)
Creatinine, Ser: 1.12 mg/dL (ref 0.76–1.27)
GFR calc Af Amer: 81 mL/min/{1.73_m2} (ref >59)
GFR calc non Af Amer: 70 mL/min/{1.73_m2} (ref >59)
Glucose: 291 mg/dL — ABNORMAL HIGH (ref 65–99)
Potassium: 3.6 mmol/L (ref 3.5–5.2)
Sodium: 141 mmol/L (ref 134–144)

## 2019-03-19 LAB — HEPATITIS C ANTIBODY: Hep C Virus Ab: <0.1 s/co ratio (ref 0.0–0.9)

## 2019-03-19 LAB — HEPATIC FUNCTION PANEL
ALT: 87 IU/L — ABNORMAL HIGH (ref 0–44)
AST: 78 IU/L — ABNORMAL HIGH (ref 0–40)
Albumin: 4.2 g/dL (ref 3.8–4.8)
Alkaline Phosphatase: 171 IU/L — ABNORMAL HIGH (ref 39–117)
Bilirubin Total: 0.5 mg/dL (ref 0.0–1.2)
Bilirubin, Direct: 0.26 mg/dL (ref 0.00–0.40)
Total Protein: 6.9 g/dL (ref 6.0–8.5)

## 2019-03-19 LAB — PSA: Prostate Specific Ag, Serum: 1.2 ng/mL (ref 0.0–4.0)

## 2019-03-19 LAB — HIV ANTIBODY (ROUTINE TESTING W REFLEX): HIV Screen 4th Generation wRfx: NONREACTIVE

## 2019-03-19 LAB — LIPID PANEL
Chol/HDL Ratio: 2.9 ratio (ref 0.0–5.0)
Cholesterol, Total: 119 mg/dL (ref 100–199)
HDL: 41 mg/dL (ref >39)
LDL Chol Calc (NIH): 42 mg/dL (ref 0–99)
Triglycerides: 231 mg/dL — ABNORMAL HIGH (ref 0–149)
VLDL Cholesterol Cal: 36 mg/dL (ref 5–40)

## 2019-03-19 LAB — URIC ACID: Uric Acid: 3.4 mg/dL — ABNORMAL LOW (ref 3.7–8.6)

## 2019-03-20 ENCOUNTER — Encounter: Payer: Self-pay | Admitting: Nurse Practitioner

## 2019-03-20 ENCOUNTER — Other Ambulatory Visit: Payer: Self-pay | Admitting: Nurse Practitioner

## 2019-03-20 ENCOUNTER — Ambulatory Visit (INDEPENDENT_AMBULATORY_CARE_PROVIDER_SITE_OTHER): Payer: 59 | Admitting: Nurse Practitioner

## 2019-03-20 ENCOUNTER — Other Ambulatory Visit: Payer: Self-pay

## 2019-03-20 DIAGNOSIS — R748 Abnormal levels of other serum enzymes: Secondary | ICD-10-CM

## 2019-03-20 DIAGNOSIS — F1010 Alcohol abuse, uncomplicated: Secondary | ICD-10-CM

## 2019-03-20 DIAGNOSIS — E781 Pure hyperglyceridemia: Secondary | ICD-10-CM

## 2019-03-20 DIAGNOSIS — I10 Essential (primary) hypertension: Secondary | ICD-10-CM | POA: Diagnosis not present

## 2019-03-20 DIAGNOSIS — N529 Male erectile dysfunction, unspecified: Secondary | ICD-10-CM

## 2019-03-20 DIAGNOSIS — F329 Major depressive disorder, single episode, unspecified: Secondary | ICD-10-CM

## 2019-03-20 DIAGNOSIS — F419 Anxiety disorder, unspecified: Secondary | ICD-10-CM | POA: Diagnosis not present

## 2019-03-20 DIAGNOSIS — F32A Depression, unspecified: Secondary | ICD-10-CM

## 2019-03-20 DIAGNOSIS — E119 Type 2 diabetes mellitus without complications: Secondary | ICD-10-CM | POA: Diagnosis not present

## 2019-03-20 MED ORDER — TRAZODONE HCL 50 MG PO TABS: 25.0000 mg | ORAL_TABLET | Freq: Every evening | ORAL | 2 refills | Status: DC | PRN

## 2019-03-20 MED ORDER — TRULICITY 0.75 MG/0.5ML ~~LOC~~ SOAJ: SUBCUTANEOUS | 2 refills | Status: DC

## 2019-03-20 MED ORDER — SILDENAFIL CITRATE 20 MG PO TABS: ORAL_TABLET | ORAL | 2 refills | Status: DC

## 2019-03-20 MED ORDER — BLOOD GLUCOSE MONITOR KIT: PACK | 0 refills | Status: DC

## 2019-03-20 NOTE — Progress Notes (Signed)
Phone visit Subjective:    Patient ID: Austin Morgan, male    DOB: 05/03/1956, 63 y.o.   MRN: [id number]  Hypertension This is a chronic problem. There are no compliance problems.   Pt states he is not consistent on checking blood pressure.  Pt has some personal issues he would like to speak with provider about.   Virtual Visit via Telephone Note  I connected with [name] on 03/20/19 at  9:20 AM EDT by telephone and verified that I am speaking with the correct person using two identifiers.  Location: Patient: home Provider: office   I discussed the limitations, risks, security and privacy concerns of performing an evaluation and management service by telephone and the availability of in person appointments. I also discussed with the patient that there may be a patient responsible charge related to this service. The patient expressed understanding and agreed to proceed.   History of Present Illness: Presents by phone with his wife present who is a Designer, jewellery to discuss several issues.  Has not done well with his diet over the past several months.  Very limited activity.  Has had significant alcohol intake over the past year up until about 2 weeks ago.  His wife reports she checked his A1c at home which was 10.6.  Current weight 178 pounds.  Heart rate 96.  BP 122/76.  Mainly drinks alcohol at nighttime to help him sleep.  His wife is requesting a prescription of trazodone to see if this will help.  Stopped his Wellbutrin since it did not seem to be helping, did not call for increased dosage.  Has been taking Saw Palmetto OTC.  Describes restless legs at nighttime.  His wife has already set him up with an appointment for mental health counseling.  Requesting an appointment to psychiatry for medication management for substance use disorder.  He responded yes to both of the PHQ 2 questions, she will give him to answer the PHQ 9 and send results.  Has been having progressive problems  with erectile dysfunction over the past year.  Sildenafil only works for very brief time.  Denies chest pain/ischemic type pain shortness of breath or edema.  Denies difficulty speaking or swallowing, visual changes, numbness or weakness of the face arms or legs.  Had a recent eye exam which he received new glasses.  Has significant problems with nausea taking medication.  Could not tolerate glipizide which he has discontinued.  Even low-dose metformin tends to cause problems.   Observations/Objective: Today's visit was via telephone Physical exam was not possible for this visit Thoughts logical coherent and relevant.  Alert and oriented.  Calm affect. Recent Results (from the past 2160 hour(s))  Novel Coronavirus, NAA ([facility])     Status: None   Collection Time: 02/17/19 11:22 AM   Specimen: Oropharyngeal(OP) collection in vial transport medium   OROPHARYNGEA  TESTING  Result Value Ref Range   SARS-CoV-2, NAA Not Detected Not Detected    Comment: Testing was performed using the cobas(R) SARS-CoV-2 test. This nucleic acid amplification test was developed and its performance characteristics determined by [employer]. Nucleic acid amplification tests include PCR and TMA. This test has not been FDA cleared or approved. This test has been authorized by FDA under an Emergency Use Authorization (EUA). This test is only authorized for the duration of time the declaration that circumstances exist justifying the authorization of the emergency use of in vitro diagnostic tests for detection of SARS-CoV-2 virus and/or diagnosis of COVID-19  infection under section 564(b)(1) of the Act, 21 U.S.C. 676HMC-9(O) (1), unless the authorization is terminated or revoked sooner. When diagnostic testing is negative, the possibility of a false negative result should be considered in the context of a patient's recent exposures and the presence of clinical signs and symptoms consistent with COVID-19. An  individual without symptoms  of COVID-19 and who is not shedding SARS-CoV-2 virus would expect to have a negative (not detected) result in this assay.   Lipid Profile     Status: Abnormal   Collection Time: 03/18/19  8:35 AM  Result Value Ref Range   Cholesterol, Total 119 100 - 199 mg/dL   Triglycerides 231 (H) 0 - 149 mg/dL   HDL 41 >39 mg/dL   VLDL Cholesterol Cal 36 5 - 40 mg/dL   LDL Chol Calc (NIH) 42 0 - 99 mg/dL   Chol/HDL Ratio 2.9 0.0 - 5.0 ratio    Comment:                                   T. Chol/HDL Ratio                                             Men  Women                               1/2 Avg.Risk  3.4    3.3                                   Avg.Risk  5.0    4.4                                2X Avg.Risk  9.6    7.1                                3X Avg.Risk 23.4   11.0   Hepatic function panel     Status: Abnormal   Collection Time: 03/18/19  8:35 AM  Result Value Ref Range   Total Protein 6.9 6.0 - 8.5 g/dL   Albumin 4.2 3.8 - 4.8 g/dL   Bilirubin Total 0.5 0.0 - 1.2 mg/dL   Bilirubin, Direct 0.26 0.00 - 0.40 mg/dL   Alkaline Phosphatase 171 (H) 39 - 117 IU/L   AST 78 (H) 0 - 40 IU/L   ALT 87 (H) 0 - 44 IU/L  Basic Metabolic Panel (BMET)     Status: Abnormal   Collection Time: 03/18/19  8:35 AM  Result Value Ref Range   Glucose 291 (H) 65 - 99 mg/dL   BUN 18 8 - 27 mg/dL   Creatinine, Ser 1.12 0.76 - 1.27 mg/dL   GFR calc non Af Amer 70 >59 mL/min/1.73   GFR calc Af Amer 81 >59 mL/min/1.73   BUN/Creatinine Ratio 16 10 - 24   Sodium 141 134 - 144 mmol/L   Potassium 3.6 3.5 - 5.2 mmol/L   Chloride 94 (L) 96 - 106 mmol/L   CO2 28 20 - 29 mmol/L   Calcium  9.6 8.6 - 10.2 mg/dL  Uric acid     Status: Abnormal   Collection Time: 03/18/19  8:35 AM  Result Value Ref Range   Uric Acid 3.4 (L) 3.7 - 8.6 mg/dL    Comment:            Therapeutic target for gout patients: <6.0  PSA     Status: None   Collection Time: 03/18/19  8:35 AM  Result Value Ref Range    Prostate Specific Ag, Serum 1.2 0.0 - 4.0 ng/mL    Comment: Roche ECLIA methodology. According to the American Urological Association, Serum PSA should decrease and remain at undetectable levels after radical prostatectomy. The AUA defines biochemical recurrence as an initial PSA value 0.2 ng/mL or greater followed by a subsequent confirmatory PSA value 0.2 ng/mL or greater. Values obtained with different assay methods or kits cannot be used interchangeably. Results cannot be interpreted as absolute evidence of the presence or absence of malignant disease.   Urine Microalbumin w/creat. ratio     Status: Abnormal   Collection Time: 03/18/19  8:35 AM  Result Value Ref Range   Creatinine, Urine 324.0 Not Estab. mg/dL   Microalbumin, Urine 379.1 Not Estab. ug/mL   Microalb/Creat Ratio 117 (H) 0 - 29 mg/g creat    Comment:                        Normal:                0 -  29                        Moderately increased: 30 - 300                        Severely increased:       >300               **Please note reference interval change**   HIV antibody (with reflex)     Status: None   Collection Time: 03/18/19  8:35 AM  Result Value Ref Range   HIV Screen 4th Generation wRfx Non Reactive Non Reactive  Hepatitis C Antibody     Status: None   Collection Time: 03/18/19  8:35 AM  Result Value Ref Range   Hep C Virus Ab <0.1 0.0 - 0.9 s/co ratio    Comment:                                   Negative:     < 0.8                              Indeterminate: 0.8 - 0.9                                   Positive:     > 0.9  The CDC recommends that a positive HCV antibody result  be followed up with a HCV Nucleic Acid Amplification  test (833825).    Reviewed labs with patient and his wife.  Assessment and Plan: Problem List Items Addressed This Visit      Cardiovascular and Mediastinum   Hypertension (Chronic)     Endocrine   Diabetes mellitus  without complication (HCC) - Primary  (Chronic)   Relevant Medications   Dulaglutide (TRULICITY) 0.97 DZ/3.2DJ SOPN     Other   Alcohol abuse   Anxiety and depression   Relevant Medications   traZODone (DESYREL) 50 MG tablet   Elevated liver enzymes   Erectile dysfunction (Chronic)   Hypertriglyceridemia (Chronic)     Meds ordered this encounter  Medications   Dulaglutide (TRULICITY) 2.42 AS/3.4HD SOPN    Sig: Inject 0.75 mg subcu once a week for diabetes    Dispense:  4 pen    Refill:  2    Order Specific Question:   Supervising Provider    Answer:   Sallee Lange A [[id number]]   traZODone (DESYREL) 50 MG tablet    Sig: Take 0.5-1 tablets (25-50 mg total) by mouth at bedtime as needed for sleep.    Dispense:  30 tablet    Refill:  2    Order Specific Question:   Supervising Provider    Answer:   Sallee Lange A [[id number]]   blood glucose meter kit and supplies KIT    Sig: Dispense based on patient and insurance preference. Use up to BID as directed.    Dispense:  1 each    Refill:  0    Diagnosis: E11.9    Order Specific Question:   Supervising Provider    Answer:   Kathyrn Drown [phone]    Order Specific Question:   Number of strips    Answer:   69    Order Specific Question:   Number of lancets    Answer:   0     Follow Up Instructions: We will send information to patient regarding provider to help him with medication management for SUD. Trazodone as directed for sleep.  DC med if any adverse effects. Trial of Trulicity lowest dose once a week.  Discussed potential adverse effects.  DC med and contact office if any problems.  Will use cautiously due to his tendency for nausea. Recheck liver enzymes in about 3 months, further action based on results.  At this point feel that alcohol use is the main factor.  Results have improved from last labs. Patient chooses to continue sildenafil as directed for now.  Explained the ED is a very complex issue including psychosocial aspects.  Depending on his improvement by  next visit, may need referral for cardiovascular evaluation. Order sent for new machine and supplies so he can check his sugar daily. Return in about 3 months (around 06/20/2019) for Recheck with labs. Patient or his wife to call back sooner if any questions or concerns.   I discussed the assessment and treatment plan with the patient. The patient was provided an opportunity to ask questions and all were answered. The patient agreed with the plan and demonstrated an understanding of the instructions.   The patient was advised to call back or seek an in-person evaluation if the symptoms worsen or if the condition fails to improve as anticipated.  I provided 25 minutes of non-face-to-face time during this encounter.   25 minutes was spent with the patient.  This statement verifies that 25 minutes was indeed spent with the patient.  More than 50% of this visit-total duration of the visit-was spent in counseling and coordination of care. The issues that the patient came in for today as reflected in the diagnosis (s) please refer to documentation for further details.     Review of Systems     Objective:  Physical Exam        Assessment & Plan:

## 2019-03-23 ENCOUNTER — Telehealth: Payer: Self-pay | Admitting: Family Medicine

## 2019-03-23 MED ORDER — ONDANSETRON HCL 8 MG PO TABS: ORAL_TABLET | ORAL | 2 refills | Status: DC

## 2019-03-23 NOTE — Telephone Encounter (Signed)
Please advise. Thank you

## 2019-03-23 NOTE — Telephone Encounter (Signed)
Zofran 8 mg, 1 tablet, 1 taken 3 times daily as needed, #21, 2 refills, if ongoing troubles with nausea may not be able to continue taking Trulicity-this is a side effect that some people get in we will get better when people does not get better and they will have to stay away from the medicine

## 2019-03-23 NOTE — Telephone Encounter (Signed)
Pt contacted and verbalized understanding. Pt states he will give [place] an update about Thursday.

## 2019-03-23 NOTE — Telephone Encounter (Signed)
Wife calling because patient had a phone visit with Hoyle Sauer on Friday and was put on Trulicity.  Did the injection Saturday morning and started throwing up Saturday night and has been throwing up off and on since then.  Wife would like some Zofran called in to [facility].  She said you can call patient at home.

## 2019-03-23 NOTE — Telephone Encounter (Signed)
So it would take a while for her glucose readings to come down but what is unknown is whether or not the patient will be able to tolerate side effects of nausea Sometimes this nausea will get better other times it does not Hopefully he will gradually improve But if the nausea is not gone away by later in the week I would suggest getting away from Trulicity He can give [place] an update in 48 to 72 hours

## 2019-03-23 NOTE — Telephone Encounter (Signed)
Pt states that he was prescribed Trulicity on Friday; first dose was taken on Saturday. Pt readings over the weekend: 425; 225; 325. Pt has eaten 3 crackers, a protein shake and some pineapple. Pt is wanting to know if he should continue the medication. Please advise. Thank you  Zofran sent in to pharmacy and pt is aware.

## 2019-04-03 ENCOUNTER — Encounter: Payer: Self-pay | Admitting: Nurse Practitioner

## 2019-04-08 ENCOUNTER — Encounter: Payer: Self-pay | Admitting: Nurse Practitioner

## 2019-04-14 ENCOUNTER — Encounter: Payer: Self-pay | Admitting: Nurse Practitioner

## 2019-04-17 ENCOUNTER — Other Ambulatory Visit: Payer: Self-pay | Admitting: Nurse Practitioner

## 2019-04-17 DIAGNOSIS — G2581 Restless legs syndrome: Secondary | ICD-10-CM | POA: Insufficient documentation

## 2019-04-17 MED ORDER — PRAMIPEXOLE DIHYDROCHLORIDE 0.125 MG PO TABS: ORAL_TABLET | ORAL | 2 refills | Status: DC

## 2019-04-17 MED ORDER — TRAZODONE HCL 50 MG PO TABS: ORAL_TABLET | ORAL | 2 refills | Status: DC

## 2019-04-18 NOTE — Telephone Encounter (Signed)
Discussed with his wife during her visit on 04/17/19. See note.

## 2019-06-08 ENCOUNTER — Other Ambulatory Visit: Payer: Self-pay | Admitting: Nurse Practitioner

## 2019-07-06 ENCOUNTER — Other Ambulatory Visit: Payer: Self-pay | Admitting: Nurse Practitioner

## 2019-07-11 ENCOUNTER — Other Ambulatory Visit: Payer: Self-pay | Admitting: Family Medicine

## 2019-07-14 ENCOUNTER — Telehealth: Payer: Self-pay | Admitting: *Deleted

## 2019-07-14 NOTE — Telephone Encounter (Signed)
Fax from [employer]. trulicity is approved through 07/12/2020. [place] notified.  Left message with pt to return call to notify him.

## 2019-07-16 NOTE — Telephone Encounter (Signed)
Pt.notified

## 2019-08-03 ENCOUNTER — Other Ambulatory Visit: Payer: Self-pay | Admitting: Nurse Practitioner

## 2019-09-10 ENCOUNTER — Telehealth: Payer: Self-pay | Admitting: Nurse Practitioner

## 2019-09-10 DIAGNOSIS — Z125 Encounter for screening for malignant neoplasm of prostate: Secondary | ICD-10-CM

## 2019-09-10 DIAGNOSIS — Z79899 Other long term (current) drug therapy: Secondary | ICD-10-CM

## 2019-09-10 DIAGNOSIS — E785 Hyperlipidemia, unspecified: Secondary | ICD-10-CM

## 2019-09-10 DIAGNOSIS — R5383 Other fatigue: Secondary | ICD-10-CM

## 2019-09-10 DIAGNOSIS — R748 Abnormal levels of other serum enzymes: Secondary | ICD-10-CM

## 2019-09-10 DIAGNOSIS — E119 Type 2 diabetes mellitus without complications: Secondary | ICD-10-CM

## 2019-09-10 DIAGNOSIS — Z1321 Encounter for screening for nutritional disorder: Secondary | ICD-10-CM

## 2019-09-10 DIAGNOSIS — I10 Essential (primary) hypertension: Secondary | ICD-10-CM

## 2019-09-10 NOTE — Telephone Encounter (Signed)
Wife sent My Chart message:  Austin Morgan has an appt with you on 4/9. He wanted me to remind you to call in order for his labwork. I wondered if you would add to his labwork a vitamin D level and testosterone level? I checked him A1c the other night and it was 5.5. They had him on insulin per ss while in treatment and since he is home and not drinking, his cbg s are running 100-130! I'm so proud of him. He is doing great and he is looking forward to seeing you. Thanks Rhonda Please advise. Thank you

## 2019-09-11 NOTE — Addendum Note (Signed)
Addended by: Patsy Lager on: 09/11/2019 09:55 AM   Modules accepted: Orders

## 2019-09-11 NOTE — Telephone Encounter (Signed)
Please order the following: CBC  MET 7 Liver Vitamin D Testosterone  Urine microalbumin ratio Please let patient or his wife know.  Thanks

## 2019-09-18 ENCOUNTER — Ambulatory Visit (INDEPENDENT_AMBULATORY_CARE_PROVIDER_SITE_OTHER): Payer: 59 | Admitting: Nurse Practitioner

## 2019-09-18 ENCOUNTER — Encounter: Payer: Self-pay | Admitting: Nurse Practitioner

## 2019-09-18 ENCOUNTER — Other Ambulatory Visit: Payer: Self-pay

## 2019-09-18 VITALS — BP 122/72 | Temp 98.1°F | Wt 185.0 lb

## 2019-09-18 DIAGNOSIS — G479 Sleep disorder, unspecified: Secondary | ICD-10-CM

## 2019-09-18 DIAGNOSIS — I10 Essential (primary) hypertension: Secondary | ICD-10-CM

## 2019-09-18 DIAGNOSIS — E119 Type 2 diabetes mellitus without complications: Secondary | ICD-10-CM | POA: Diagnosis not present

## 2019-09-18 DIAGNOSIS — D539 Nutritional anemia, unspecified: Secondary | ICD-10-CM

## 2019-09-18 DIAGNOSIS — F1021 Alcohol dependence, in remission: Secondary | ICD-10-CM | POA: Insufficient documentation

## 2019-09-18 DIAGNOSIS — N529 Male erectile dysfunction, unspecified: Secondary | ICD-10-CM

## 2019-09-18 DIAGNOSIS — R748 Abnormal levels of other serum enzymes: Secondary | ICD-10-CM | POA: Diagnosis not present

## 2019-09-18 LAB — CBC WITH DIFFERENTIAL/PLATELET
Basophils Absolute: 0.1 10*3/uL (ref 0.0–0.2)
Basos: 1 %
EOS (ABSOLUTE): 0.4 10*3/uL (ref 0.0–0.4)
Eos: 5 %
Hematocrit: 37.5 % (ref 37.5–51.0)
Hemoglobin: 12.5 g/dL — ABNORMAL LOW (ref 13.0–17.7)
Immature Grans (Abs): 0 10*3/uL (ref 0.0–0.1)
Immature Granulocytes: 0 %
Lymphocytes Absolute: 1.5 10*3/uL (ref 0.7–3.1)
Lymphs: 23 %
MCH: 34.8 pg — ABNORMAL HIGH (ref 26.6–33.0)
MCHC: 33.3 g/dL (ref 31.5–35.7)
MCV: 105 fL — ABNORMAL HIGH (ref 79–97)
Monocytes Absolute: 0.5 10*3/uL (ref 0.1–0.9)
Monocytes: 8 %
Neutrophils Absolute: 4.2 10*3/uL (ref 1.4–7.0)
Neutrophils: 63 %
Platelets: 213 10*3/uL (ref 150–450)
RBC: 3.59 x10E6/uL — ABNORMAL LOW (ref 4.14–5.80)
RDW: 12.9 % (ref 11.6–15.4)
WBC: 6.7 10*3/uL (ref 3.4–10.8)

## 2019-09-18 LAB — TESTOSTERONE: Testosterone: 351 ng/dL (ref 264–916)

## 2019-09-18 LAB — HEPATIC FUNCTION PANEL
ALT: 44 IU/L (ref 0–44)
AST: 70 IU/L — ABNORMAL HIGH (ref 0–40)
Albumin: 4.2 g/dL (ref 3.8–4.8)
Alkaline Phosphatase: 198 IU/L — ABNORMAL HIGH (ref 39–117)
Bilirubin Total: 0.6 mg/dL (ref 0.0–1.2)
Bilirubin, Direct: 0.39 mg/dL (ref 0.00–0.40)
Total Protein: 7 g/dL (ref 6.0–8.5)

## 2019-09-18 LAB — BASIC METABOLIC PANEL
BUN/Creatinine Ratio: 21 (ref 10–24)
BUN: 24 mg/dL (ref 8–27)
CO2: 22 mmol/L (ref 20–29)
Calcium: 9.9 mg/dL (ref 8.6–10.2)
Chloride: 102 mmol/L (ref 96–106)
Creatinine, Ser: 1.16 mg/dL (ref 0.76–1.27)
GFR calc Af Amer: 77 mL/min/{1.73_m2} (ref >59)
GFR calc non Af Amer: 67 mL/min/{1.73_m2} (ref >59)
Glucose: 127 mg/dL — ABNORMAL HIGH (ref 65–99)
Potassium: 4.8 mmol/L (ref 3.5–5.2)
Sodium: 137 mmol/L (ref 134–144)

## 2019-09-18 LAB — GLUCOSE, POCT (MANUAL RESULT ENTRY): POC Glucose: 108 mg/dL — AB (ref 70–99)

## 2019-09-18 LAB — MICROALBUMIN / CREATININE URINE RATIO
Creatinine, Urine: 161.2 mg/dL
Microalb/Creat Ratio: 13 mg/g{creat} (ref 0–29)
Microalbumin, Urine: 21.1 ug/mL

## 2019-09-18 LAB — VITAMIN D 25 HYDROXY (VIT D DEFICIENCY, FRACTURES): Vit D, 25-Hydroxy: 50.7 ng/mL (ref 30.0–100.0)

## 2019-09-18 MED ORDER — TRULICITY 0.75 MG/0.5ML ~~LOC~~ SOAJ: SUBCUTANEOUS | 2 refills | Status: DC

## 2019-09-18 MED ORDER — TADALAFIL 20 MG PO TABS: ORAL_TABLET | ORAL | 2 refills | Status: DC

## 2019-09-18 MED ORDER — TRAZODONE HCL 100 MG PO TABS: 100.0000 mg | ORAL_TABLET | Freq: Every day | ORAL | 2 refills | Status: DC

## 2019-09-18 MED ORDER — NALTREXONE HCL 50 MG PO TABS: ORAL_TABLET | ORAL | 0 refills | Status: DC

## 2019-09-18 NOTE — Progress Notes (Signed)
   Subjective:    Patient ID: Austin Morgan, male    DOB: Feb 15, 1956, 64 y.o.   MRN: [id number]  HPI Patient comes in today for a follow up appointment and discuss medications.  Patient states he got out of rehab for alcohol abuse Feb 25th and would like to discuss the Naltrexone.  Patient would like to discuss his Sildenafil, Trazodone and trulicity.  Patient complains os trouble sleeping and waking up at night.     Review of Systems     Objective:   Physical Exam        Assessment & Plan:

## 2019-09-18 NOTE — Patient Instructions (Signed)
Start B12 Supplement Trazadone Changed to 100mg  nightly

## 2019-09-18 NOTE — Progress Notes (Signed)
Subjective:    Patient ID: Austin Morgan, male    DOB: 06/08/1956, 64 y.o.   MRN: [id number]  HPI: Patient presents for follow up and lab review. Patient was recently in an inpatient rehabilitation center in [place] for alcoholism and has been home x3 weeks. Denies any pain complaints at this time.     Review of Systems  Constitutional: Negative for appetite change, fatigue, fever and unexpected weight change.       Patient reports that he has recently regained his appetite. Stated that during rehabilitation stay he had severe loss of appetite and now is eating more balanced diet  Respiratory: Positive for chest tightness. Negative for cough, shortness of breath, wheezing and stridor.   Cardiovascular: Negative for chest pain, palpitations and leg swelling.  Gastrointestinal: Negative for abdominal pain, diarrhea, nausea and vomiting.  Genitourinary: Negative for difficulty urinating.       Patient reports erectile dysfunction that he states he had to "take 5 tablets" of Viagra to overcome  Skin: Negative for color change and wound.  Neurological: Negative for dizziness, tremors, weakness, numbness and headaches.       Denies tingling in hands or feet  Psychiatric/Behavioral: Positive for sleep disturbance. Negative for agitation and confusion. The patient is not nervous/anxious.        Patient reports sleep difficulties   Currently on Trazodone 50-75 mg for sleep.  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 Patient reports he was rejected from [facility] to be seen as a patient and for Naltrexone refill.    Objective:   Physical Exam Constitutional:      General: He is not in acute distress.    Appearance: Normal appearance. He  is not ill-appearing, toxic-appearing or diaphoretic.  Cardiovascular:     Rate and Rhythm: Normal rate and regular rhythm.  Pulmonary:     Effort: Pulmonary effort is normal.     Breath sounds: Normal breath sounds.  Abdominal:     General: Bowel sounds are normal. There is distension.     Palpations: Abdomen is soft.     Tenderness: There is no abdominal tenderness.  Musculoskeletal:        General: Normal range of motion.     Right foot: Normal range of motion. No deformity, bunion, Charcot foot or foot drop.     Left foot: Normal range of motion. No deformity, bunion, Charcot foot or foot drop.  Feet:     Right foot:     Skin integrity: No ulcer, blister, skin breakdown, erythema, warmth or callus.     Toenail Condition: Right toenails are normal.     Left foot:     Skin integrity: No ulcer, blister, skin breakdown, erythema, warmth or callus.     Toenail Condition: Left toenails are normal.  Skin:    General: Skin is warm and dry.     Capillary Refill: Capillary refill takes less than 2 seconds.  Neurological:     Mental Status: He is alert and oriented to person, place, and time.  Psychiatric:        Mood and Affect: Mood normal.        Behavior: Behavior normal. Behavior is cooperative.  Thought Content: Thought content normal.        Judgment: Judgment normal.    Diabetic Foot Exam - Simple   Simple Foot Form Visual Inspection No deformities, no ulcerations, no other skin breakdown bilaterally: Yes Sensation Testing Intact to touch and monofilament testing bilaterally: Yes Pulse Check Posterior Tibialis and Dorsalis pulse intact bilaterally: Yes Comments    Today's Vitals   09/18/19 1351  BP: 122/72  Temp: 98.1 F (36.7 C)  Weight: 185 lb (83.9 kg)   Body mass index is 28.98 kg/m. Recent Results (from the past 2160 hour(s))  CBC with Differential/Platelet     Status: Abnormal   Collection Time: 09/17/19  8:36 AM  Result Value Ref Range   WBC 6.7  3.4 - 10.8 x10E3/uL   RBC 3.59 (L) 4.14 - 5.80 x10E6/uL   Hemoglobin 12.5 (L) 13.0 - 17.7 g/dL   Hematocrit 37.5 37.5 - 51.0 %   MCV 105 (H) 79 - 97 fL   MCH 34.8 (H) 26.6 - 33.0 pg   MCHC 33.3 31.5 - 35.7 g/dL   RDW 12.9 11.6 - 15.4 %   Platelets 213 150 - 450 x10E3/uL   Neutrophils 63 Not Estab. %   Lymphs 23 Not Estab. %   Monocytes 8 Not Estab. %   Eos 5 Not Estab. %   Basos 1 Not Estab. %   Neutrophils Absolute 4.2 1.4 - 7.0 x10E3/uL   Lymphocytes Absolute 1.5 0.7 - 3.1 x10E3/uL   Monocytes Absolute 0.5 0.1 - 0.9 x10E3/uL   EOS (ABSOLUTE) 0.4 0.0 - 0.4 x10E3/uL   Basophils Absolute 0.1 0.0 - 0.2 x10E3/uL   Immature Granulocytes 0 Not Estab. %   Immature Grans (Abs) 0.0 0.0 - 0.1 X32G4/WN  Basic metabolic panel     Status: Abnormal   Collection Time: 09/17/19  8:36 AM  Result Value Ref Range   Glucose 127 (H) 65 - 99 mg/dL   BUN 24 8 - 27 mg/dL   Creatinine, Ser 1.16 0.76 - 1.27 mg/dL   GFR calc non Af Amer 67 >59 mL/min/1.73   GFR calc Af Amer 77 >59 mL/min/1.73   BUN/Creatinine Ratio 21 10 - 24   Sodium 137 134 - 144 mmol/L   Potassium 4.8 3.5 - 5.2 mmol/L   Chloride 102 96 - 106 mmol/L   CO2 22 20 - 29 mmol/L   Calcium 9.9 8.6 - 10.2 mg/dL  Hepatic function panel     Status: Abnormal   Collection Time: 09/17/19  8:36 AM  Result Value Ref Range   Total Protein 7.0 6.0 - 8.5 g/dL   Albumin 4.2 3.8 - 4.8 g/dL   Bilirubin Total 0.6 0.0 - 1.2 mg/dL   Bilirubin, Direct 0.39 0.00 - 0.40 mg/dL   Alkaline Phosphatase 198 (H) 39 - 117 IU/L   AST 70 (H) 0 - 40 IU/L   ALT 44 0 - 44 IU/L  VITAMIN D 25 Hydroxy (Vit-D Deficiency, Fractures)     Status: None   Collection Time: 09/17/19  8:36 AM  Result Value Ref Range   Vit D, 25-Hydroxy 50.7 30.0 - 100.0 ng/mL    Comment: Vitamin D deficiency has been defined by the [place] and an Endocrine Society practice guideline as a level of serum 25-OH vitamin D less than 20 ng/mL (1,2). The Endocrine Society went on  to further define vitamin D insufficiency as a level between 21 and 29 ng/mL (2). 1. IOM (Institute of Medicine). 2010. Dietary reference  intakes for calcium and D. [place]: The    [employer]. 2. Holick MF, Binkley Braxton, Bischoff-Ferrari HA, et al.    Evaluation, treatment, and prevention of vitamin D    deficiency: an Endocrine Society clinical practice    guideline. JCEM. 2011 Jul; 96(7):1911-30.   Microalbumin / creatinine urine ratio     Status: None   Collection Time: 09/17/19  8:36 AM  Result Value Ref Range   Creatinine, Urine 161.2 Not Estab. mg/dL   Microalbumin, Urine 21.1 Not Estab. ug/mL   Microalb/Creat Ratio 13 0 - 29 mg/g creat    Comment:                        Normal:                0 -  29                        Moderately increased: 30 - 300                        Severely increased:       >300   Testosterone     Status: None   Collection Time: 09/17/19  8:36 AM  Result Value Ref Range   Testosterone 351 264 - 916 ng/dL    Comment: Adult male reference interval is based on a population of healthy nonobese males (BMI <30) between 55 and 83 years old. [place], [place] [phone]. PMID: [id number].   POCT Glucose (CBG)     Status: Abnormal   Collection Time: 09/18/19  1:55 PM  Result Value Ref Range   POC Glucose 108 (A) 70 - 99 mg/dl   Reviewed recent labs with patient.      Assessment & Plan:   Problem List Items Addressed This Visit      Cardiovascular and Mediastinum   Hypertension (Chronic)   Relevant Medications   tadalafil (CIALIS) 20 MG tablet     Endocrine   Diabetes mellitus without complication (HCC) - Primary (Chronic)   Relevant Medications   Dulaglutide (TRULICITY) 6.38 VF/6.4PP SOPN   Other Relevant Orders   POCT Glucose (CBG) (Completed)     Other   Elevated liver enzymes   Erectile dysfunction (Chronic)   Macrocytic anemia mild   Recovering alcoholic in remission [facility])   Sleep disturbance       Meds ordered this encounter  Medications  . traZODone (DESYREL) 100 MG tablet    Sig: Take 1 tablet (100 mg total) by mouth at bedtime.    Dispense:  30 tablet    Refill:  2    Order Specific Question:   Supervising Provider    Answer:   Sallee Lange A [id number]  . tadalafil (CIALIS) 20 MG tablet    Sig: Take one tab every other day as needed for erectile dysfunction    Dispense:  10 tablet    Refill:  2    Order Specific Question:   Supervising Provider    Answer:   [place], [place]  . Dulaglutide (TRULICITY) 2.95 JO/8.4ZY SOPN    Sig: INJECT 1 SYRINGE SUBCUTANEOUSLY ONCE A WEEK (ON THE SAME DAY OF EACH WEEK) AS DIRECTED.    Dispense:  2 mL    Refill:  2    Order Specific Question:   Supervising Provider    Answer:   Sallee Lange A [[id number]]  .  naltrexone (DEPADE) 50 MG tablet    Sig: Take 50 mg tablet daily    Dispense:  30 tablet    Refill:  0    Order Specific Question:   Supervising Provider    Answer:   Kathyrn Drown [phone]     Shared decision making with patient on medication and discussion of lab work. Discussion of medications included increasing Trazodone to 100mg  nightly for sleep and incorporating the use of Melatonin nightly. Changing patient's Viagra to Cialis, and refilling Trulicity. Patient also requesting assistance in obtaining his Naltrexone as it was previously prescribed from inpatient rehab in [place]. Will consult with Dr. Nicki Reaper on Naltrexone medication and attempt to obtain resources to assist with [facility] follow up. Due to macrocytic anemia concerns discussed with CBC, patient encouraged to take B12 supplement.   Patient to follow up in office in 3 months. Call back sooner if needed.   30 minutes was spent with the patient including previsit chart review, time spent with patient, discussion of health issues, review of data including medical record, and documentation of the visit. NP consulted with Dr. Nicki Reaper after visit. Our office will provide refills  of Naltrexone as long as he is stable.

## 2019-09-19 ENCOUNTER — Encounter: Payer: Self-pay | Admitting: Nurse Practitioner

## 2019-09-19 DIAGNOSIS — D539 Nutritional anemia, unspecified: Secondary | ICD-10-CM | POA: Insufficient documentation

## 2019-09-21 ENCOUNTER — Other Ambulatory Visit: Payer: Self-pay | Admitting: Nurse Practitioner

## 2019-09-21 DIAGNOSIS — K219 Gastro-esophageal reflux disease without esophagitis: Secondary | ICD-10-CM

## 2019-09-21 DIAGNOSIS — Z1211 Encounter for screening for malignant neoplasm of colon: Secondary | ICD-10-CM

## 2019-09-21 DIAGNOSIS — D539 Nutritional anemia, unspecified: Secondary | ICD-10-CM

## 2019-09-25 ENCOUNTER — Encounter: Payer: Self-pay | Admitting: Family Medicine

## 2019-09-29 ENCOUNTER — Encounter: Payer: Self-pay | Admitting: Internal Medicine

## 2019-10-02 ENCOUNTER — Other Ambulatory Visit: Payer: Self-pay | Admitting: Nurse Practitioner

## 2019-10-02 DIAGNOSIS — D539 Nutritional anemia, unspecified: Secondary | ICD-10-CM

## 2019-10-02 LAB — VITAMIN B12: Vitamin B-12: 586 pg/mL (ref 232–1245)

## 2019-10-02 LAB — FOLATE: Folate: >20 ng/mL (ref >3.0)

## 2019-10-14 ENCOUNTER — Encounter: Payer: Self-pay | Admitting: Gastroenterology

## 2019-10-14 ENCOUNTER — Ambulatory Visit (INDEPENDENT_AMBULATORY_CARE_PROVIDER_SITE_OTHER): Payer: 59 | Admitting: Gastroenterology

## 2019-10-14 ENCOUNTER — Other Ambulatory Visit: Payer: Self-pay

## 2019-10-14 ENCOUNTER — Other Ambulatory Visit: Payer: Self-pay | Admitting: *Deleted

## 2019-10-14 ENCOUNTER — Encounter: Payer: Self-pay | Admitting: *Deleted

## 2019-10-14 VITALS — BP 125/88 | HR 100 | Temp 97.3°F | Ht 67.0 in | Wt 181.2 lb

## 2019-10-14 DIAGNOSIS — K5900 Constipation, unspecified: Secondary | ICD-10-CM

## 2019-10-14 DIAGNOSIS — Z1211 Encounter for screening for malignant neoplasm of colon: Secondary | ICD-10-CM | POA: Insufficient documentation

## 2019-10-14 DIAGNOSIS — R7989 Other specified abnormal findings of blood chemistry: Secondary | ICD-10-CM

## 2019-10-14 DIAGNOSIS — K219 Gastro-esophageal reflux disease without esophagitis: Secondary | ICD-10-CM | POA: Diagnosis not present

## 2019-10-14 DIAGNOSIS — F1021 Alcohol dependence, in remission: Secondary | ICD-10-CM | POA: Diagnosis not present

## 2019-10-14 DIAGNOSIS — R748 Abnormal levels of other serum enzymes: Secondary | ICD-10-CM | POA: Diagnosis not present

## 2019-10-14 NOTE — Patient Instructions (Signed)
1. For constipation: Take Miralax once capful three times a day until good soft bowel movement and then drop back to once daily as needed. 2. For GERD: Take omeprazole twice daily before breakfast and before evening meal. Call if you decide you want me to write prescription. 3. For your liver: abdominal ultrasound as scheduled. We will update your labs in two months. We will remind you closer to the time you are due.  4. Colon cancer screening: colonoscopy as scheduled. See separate instructions.

## 2019-10-14 NOTE — Progress Notes (Signed)
Primary Care Physician:  Kathyrn Drown, MD Referring provider: Pearson Forster, NP Primary Gastroenterologist:  Garfield Cornea, MD   Chief Complaint  Patient presents with  . Gastroesophageal Reflux    ok if takes Omeprazole  . Colonoscopy    last TCS age 64  . Constipation    HPI:  Austin Morgan is a 64 y.o. male here at the request of Pearson Forster, NP for further evaluation of GERD and colon cancer screening.   Patient reports he has h/o etoh abuse. He recently completed inpatient rehab in [place] in 08/2019. No longer drinking although states he slipped up about two weeks ago but now back on track. States he has had GERD for greater than five years. He contributed his symptoms to his etoh use. He has been taking omeprazole 1m OTC once daily but some days forgets to take. It has been helping but having some breakthrough symptoms. No dysphagia. No N/V. No abdominal pain. Feels bloated with constipation. Notes he used to have BMs like "clockwork" a couple a day. Now maybe once per day but incomplete. [place] 3-4. No melena, brbpr.   Patient has had abnormal LFTs as outlined below. Remote u/s years ago. Hep C Ab negative. Alk phos has been climbing with improvement of AST/ALT.    Patient states his wife is a NP. States it is okay to communicate with her about his health.   Component     Latest Ref Rng & Units 08/24/2017 01/04/2018 03/18/2019 09/17/2019  Total Bilirubin     0.0 - 1.2 mg/dL 0.5 0.4 0.5 0.6  BILIRUBIN, DIRECT     0.00 - 0.40 mg/dL 0.19 0.20 0.26 0.39  Alkaline Phosphatase     39 - 117 IU/L 86 83 171 (H) 198 (H)  AST     0 - 40 IU/L 16 140 (H) 78 (H) 70 (H)  ALT     0 - 44 IU/L 23 89 (H) 87 (H) 44     Current Outpatient Medications  Medication Sig Dispense Refill  . allopurinol (ZYLOPRIM) 300 MG tablet TAKE 1 TABLET BY MOUTH TWO  TIMES DAILY 180 tablet 0  . aspirin 81 MG tablet Take 81 mg by mouth daily.    .Marland Kitchenatorvastatin (LIPITOR) 40 MG tablet TAKE 1 TABLET BY  MOUTH  DAILY 90 tablet 0  . blood glucose meter kit and supplies KIT Dispense based on patient and insurance preference. Use up to BID as directed. 1 each 0  . colchicine 0.6 MG tablet Take 1 tablet (0.6 mg total) by mouth 2 (two) times daily. (Patient taking differently: Take 0.6 mg by mouth as needed. ) 180 tablet 1  . Dulaglutide (TRULICITY) 09.56MLO/7.5IESOPN INJECT 1 SYRINGE SUBCUTANEOUSLY ONCE A WEEK (ON THE SAME DAY OF EACH WEEK) AS DIRECTED. 2 mL 2  . fish oil-omega-3 fatty acids 1000 MG capsule Take 1 g by mouth 2 (two) times daily.     .Marland KitchenglipiZIDE (GLUCOTROL) 5 MG tablet Take 5 mg by mouth 2 (two) times daily.    . hydrochlorothiazide (HYDRODIURIL) 25 MG tablet TAKE 1 TABLET BY MOUTH  DAILY 30 tablet 0  . lisinopril (ZESTRIL) 5 MG tablet TAKE 1 TABLET BY MOUTH  DAILY 30 tablet 0  . magnesium oxide (MAG-OX) 400 MG tablet Take 400 mg by mouth daily.    . metFORMIN (GLUCOPHAGE) 500 MG tablet TAKE 1 TABLET BY MOUTH TWO  TIMES DAILY 30 tablet 0  . Multiple Vitamins-Minerals (MULTIVITAMIN WITH MINERALS) tablet Take 1  tablet by mouth daily.    . naltrexone (DEPADE) 50 MG tablet Take 50 mg tablet daily 30 tablet 0  . naproxen sodium (ANAPROX) 220 MG tablet Take 220 mg by mouth as needed.    Marland Kitchen omeprazole (PRILOSEC) 20 MG capsule Take 20 mg by mouth daily.    . ondansetron (ZOFRAN ODT) 8 MG disintegrating tablet Take 1 tablet (8 mg total) by mouth every 8 (eight) hours as needed for nausea or vomiting. 12 tablet 0  . ONETOUCH ULTRA test strip USE AS DIRECTED TO TEST BLOOD GLUCOSE TWICE DAILY. 50 each 1  . pramipexole (MIRAPEX) 0.125 MG tablet Take one tab po qhs at least 2 hours before bedtime for restless legs 30 tablet 2  . tadalafil (CIALIS) 20 MG tablet Take one tab every other day as needed for erectile dysfunction 10 tablet 2  . traZODone (DESYREL) 100 MG tablet Take 1 tablet (100 mg total) by mouth at bedtime. 30 tablet 2   No current facility-administered medications for this visit.     Allergies as of 10/14/2019 - Review Complete 10/14/2019  Allergen Reaction Noted  . Codeine  11/26/2008    Past Medical History:  Diagnosis Date  . Diabetes mellitus without complication ([place])   . ED (erectile dysfunction)   . H/O ETOH abuse   . Hyperlipidemia   . Hypertension   . Hypertriglyceridemia     Past Surgical History:  Procedure Laterality Date  . COLONOSCOPY  2010   Dr. Delfin Edis: mild diverticulosis, next colonoscopy 2020  . NECK SURGERY      Family History  Problem Relation Age of Onset  . Heart attack Father   . Colon cancer Neg Hx     Social History   Socioeconomic History  . Marital status: Married    Spouse name: Not on file  . Number of children: Not on file  . Years of education: Not on file  . Highest education level: Not on file  Occupational History  . Not on file  Tobacco Use  . Smoking status: Former Smoker    Packs/day: 1.00    Years: 36.00    Pack years: 36.00    Types: Cigarettes    Quit date: 03/11/2016    Years since quitting: 3.5  . Smokeless tobacco: Never Used  Substance and Sexual Activity  . Alcohol use: Not Currently    Comment: drinks a fifth of liquor per week; inpatient rehab 08/2019. denied 10/14/19  . Drug use: No  . Sexual activity: Yes  Other Topics Concern  . Not on file  Social History Narrative   Wife, Raymundo Rout NP.   Social Determinants of Health   Financial Resource Strain:   . Difficulty of Paying Living Expenses:   Food Insecurity:   . Worried About Charity fundraiser in the Last Year:   . Arboriculturist in the Last Year:   Transportation Needs:   . Film/video editor (Medical):   Marland Kitchen Lack of Transportation (Non-Medical):   Physical Activity:   . Days of Exercise per Week:   . Minutes of Exercise per Session:   Stress:   . Feeling of Stress :   Social Connections:   . Frequency of Communication with Friends and Family:   . Frequency of Social Gatherings with Friends and Family:   .  Attends Religious Services:   . Active Member of Clubs or Organizations:   . Attends Archivist Meetings:   Marland Kitchen Marital Status:  Intimate Partner Violence:   . Fear of Current or Ex-Partner:   . Emotionally Abused:   Marland Kitchen Physically Abused:   . Sexually Abused:       ROS:  General: Negative for anorexia, weight loss, fever, chills, fatigue, weakness. Eyes: Negative for vision changes.  ENT: Negative for hoarseness, difficulty swallowing , nasal congestion. CV: Negative for chest pain, angina, palpitations, dyspnea on exertion, peripheral edema.  Respiratory: Negative for dyspnea at rest, dyspnea on exertion, cough, sputum, wheezing.  GI: See history of present illness. GU:  Negative for dysuria, hematuria, urinary incontinence, urinary frequency, nocturnal urination.  MS: Negative for joint pain, low back pain.  Derm: Negative for rash or itching.  Neuro: Negative for weakness, abnormal sensation, seizure, frequent headaches, memory loss, confusion.  Psych: Negative for anxiety, depression, suicidal ideation, hallucinations.  Endo: Negative for unusual weight change.  Heme: Negative for bruising or bleeding. Allergy: Negative for rash or hives.    Physical Examination:  BP 125/88   Pulse 100   Temp (!) 97.3 F (36.3 C) (Temporal)   Ht 5' 7"  (1.702 m)   Wt 181 lb 3.2 oz (82.2 kg)   BMI 28.38 kg/m    General: Well-nourished, well-developed in no acute distress.  Head: Normocephalic, atraumatic.   Eyes: Conjunctiva pink, no icterus. Mouth:masked Neck: Supple without thyromegaly, masses, or lymphadenopathy.  Lungs: Clear to auscultation bilaterally.  Heart: Regular rate and rhythm, no murmurs rubs or gallops.  Abdomen: Bowel sounds are normal, nontender, nondistended, no hepatosplenomegaly or masses, no abdominal bruits or    hernia , no rebound or guarding.   Rectal: not performed Extremities: No lower extremity edema. No clubbing or deformities.  Neuro: Alert  and oriented x 4 , grossly normal neurologically.  Skin: Warm and dry, no rash or jaundice.   Psych: Alert and cooperative, normal mood and affect.  Labs: Lab Results  Component Value Date   CREATININE 1.16 09/17/2019   BUN 24 09/17/2019   NA 137 09/17/2019   K 4.8 09/17/2019   CL 102 09/17/2019   CO2 22 09/17/2019   Lab Results  Component Value Date   WBC 6.7 09/17/2019   HGB 12.5 (L) 09/17/2019   HCT 37.5 09/17/2019   MCV 105 (H) 09/17/2019   PLT 213 09/17/2019   Lab Results  Component Value Date   ALT 44 09/17/2019   AST 70 (H) 09/17/2019   ALKPHOS 198 (H) 09/17/2019   BILITOT 0.6 09/17/2019   Lab Results  Component Value Date   FOLATE >20.0 10/01/2019   Lab Results  Component Value Date   [id number] 389 10/01/2019   No results found for: IRON, TIBC, FERRITIN  Component     Latest Ref Rng & Units 03/18/2019  Hep C Virus Ab     0.0 - 0.9 s/co ratio <0.1   Imaging Studies: No results found.  Impression/Plan: 64 year old gentleman with history of alcohol abuse (currently in remission), chronic GERD, constipation, abnormal LFTs presenting for further evaluation.  Abnormal LFTs: Elevated alkaline phosphatase, transaminases likely related to alcohol use.  Plan to wait 2 months and recheck his numbers again, hopefully they will improve the longer his goes without alcohol.  We will also check iron/TIBC/ferritin at that time.  Recommend abdominal ultrasound to evaluate liver for any evidence of fibrosis/cirrhosis.  Reassuringly his albumin, platelets are normal.  GERD: Patient states he has had reflux symptoms for years, contributed mostly to his alcohol use.  Currently better controlled on omeprazole.  He is taking  over-the-counter regimen, trying to take once daily but admits that he skips a dose frequently.  Recommend regular use, omeprazole 20 mg every morning before breakfast.  Can take a dose in the evening before supper if he continues to have breakthrough  symptoms.  He recently "stocked" up on OTC omeprazole.  If he decides he prefers prescription omeprazole he can let me know, may be cheaper for him.  We discussed upper endoscopy today for Barrett's screening given multiple risk factors.  He is in agreement.  Plan for deep sedation given history of prior alcohol abuse.  I have discussed the risks, alternatives, benefits with regards to but not limited to the risk of reaction to medication, bleeding, infection, perforation and the patient is agreeable to proceed. Written consent to be obtained.  Constipation: Continues to have daily bowel movement but stool is hard and less productive.  Likely related to change in diet, alcohol cessation.  Recommend increasing water intake.  Can add MiraLAX 17 g 3 times a day until soft bowel movement, then back down to once daily as needed.  Colon cancer screening: Last colonoscopy in 2010.  He had mild diverticulosis at the time.  Due for colonoscopy.  Plan for deep sedation given history of prior alcohol abuse.  I have discussed the risks, alternatives, benefits with regards to but not limited to the risk of reaction to medication, bleeding, infection, perforation and the patient is agreeable to proceed. Written consent to be obtained.

## 2019-10-19 ENCOUNTER — Ambulatory Visit (HOSPITAL_COMMUNITY)
Admission: RE | Admit: 2019-10-19 | Discharge: 2019-10-19 | Disposition: A | Discharge status: Home / Self Care | Payer: 59 | Source: Ambulatory Visit | Attending: Gastroenterology | Admitting: Gastroenterology

## 2019-10-19 ENCOUNTER — Other Ambulatory Visit: Payer: Self-pay

## 2019-10-19 DIAGNOSIS — R7989 Other specified abnormal findings of blood chemistry: Secondary | ICD-10-CM

## 2019-10-22 ENCOUNTER — Encounter: Payer: Self-pay | Admitting: Nurse Practitioner

## 2019-10-22 DIAGNOSIS — I719 Aortic aneurysm of unspecified site, without rupture: Secondary | ICD-10-CM | POA: Insufficient documentation

## 2019-11-02 ENCOUNTER — Other Ambulatory Visit: Payer: Self-pay | Admitting: Nurse Practitioner

## 2019-11-13 ENCOUNTER — Other Ambulatory Visit: Payer: Self-pay | Admitting: Family Medicine

## 2019-12-12 ENCOUNTER — Other Ambulatory Visit: Payer: Self-pay | Admitting: Nurse Practitioner

## 2019-12-15 NOTE — Telephone Encounter (Signed)
Please schedule and then route back

## 2019-12-15 NOTE — Telephone Encounter (Signed)
Last med check up 03/20/19

## 2019-12-15 NOTE — Telephone Encounter (Signed)
90-day does need to follow-up

## 2019-12-15 NOTE — Telephone Encounter (Signed)
Lipid, liver, met 7, A1c, CBC May have this +2 refills

## 2019-12-15 NOTE — Telephone Encounter (Signed)
lvm to schedule appt.  

## 2019-12-15 NOTE — Telephone Encounter (Signed)
Pt made appt for 8/9 is there blood work that needs to be done

## 2019-12-15 NOTE — Telephone Encounter (Signed)
Last labs April 2021 cbc, bmp, liver, vit d, microalb urine, b12, folate. Has active orders for cbc that was put in on 10/02/19

## 2019-12-16 ENCOUNTER — Other Ambulatory Visit: Payer: Self-pay | Admitting: Family Medicine

## 2019-12-16 DIAGNOSIS — I10 Essential (primary) hypertension: Secondary | ICD-10-CM

## 2019-12-16 DIAGNOSIS — E785 Hyperlipidemia, unspecified: Secondary | ICD-10-CM

## 2019-12-16 DIAGNOSIS — Z79899 Other long term (current) drug therapy: Secondary | ICD-10-CM

## 2019-12-16 DIAGNOSIS — E119 Type 2 diabetes mellitus without complications: Secondary | ICD-10-CM

## 2019-12-16 NOTE — Telephone Encounter (Signed)
Refills sent to pharmacy. Lab orders placed and mailed to patient. Left message to return call

## 2019-12-17 ENCOUNTER — Telehealth: Payer: Self-pay

## 2019-12-17 MED ORDER — SUPREP BOWEL PREP KIT 17.5-3.13-1.6 GM/177ML PO SOLN
1.0000 | ORAL | 0 refills | Status: DC
Start: 2019-12-17 — End: 2020-01-23

## 2019-12-17 NOTE — Telephone Encounter (Signed)
Called pt, TCS/EGD w/RMR w/Prop (ASA 2) scheduled for 01/21/20 at 8:00am.  COVID test 01/20/20 at 9:00am. Orders entered. Appt letter and instructions mailed to pt.

## 2019-12-22 ENCOUNTER — Other Ambulatory Visit: Payer: Self-pay

## 2019-12-22 NOTE — Telephone Encounter (Signed)
PA for TCS/EGD submitted via [facility] website. Case approved. PA# [id number], valid 01/21/20-03/20/20.

## 2019-12-24 ENCOUNTER — Encounter: Payer: Self-pay | Admitting: Nurse Practitioner

## 2019-12-25 ENCOUNTER — Other Ambulatory Visit: Payer: Self-pay | Admitting: Nurse Practitioner

## 2019-12-25 MED ORDER — NABUMETONE 500 MG PO TABS: 500.0000 mg | ORAL_TABLET | Freq: Two times a day (BID) | ORAL | 0 refills | Status: DC | PRN

## 2019-12-25 MED ORDER — CYCLOBENZAPRINE HCL 10 MG PO TABS: ORAL_TABLET | ORAL | 0 refills | Status: DC

## 2020-01-05 ENCOUNTER — Other Ambulatory Visit: Payer: Self-pay | Admitting: Family Medicine

## 2020-01-07 ENCOUNTER — Encounter (HOSPITAL_COMMUNITY)
Admission: RE | Admit: 2020-01-07 | Discharge: 2020-01-07 | Disposition: A | Discharge status: Home / Self Care | Payer: 59 | Source: Ambulatory Visit | Attending: Internal Medicine | Admitting: Internal Medicine

## 2020-01-07 ENCOUNTER — Encounter (HOSPITAL_COMMUNITY): Payer: Self-pay

## 2020-01-07 ENCOUNTER — Other Ambulatory Visit: Payer: Self-pay

## 2020-01-07 HISTORY — DX: Sleep apnea, unspecified: G47.30

## 2020-01-07 HISTORY — DX: Unspecified osteoarthritis, unspecified site: M19.90

## 2020-01-07 HISTORY — DX: Other specified postprocedural states: Z98.890

## 2020-01-07 HISTORY — DX: Gout, unspecified: M10.9

## 2020-01-07 HISTORY — DX: Nausea with vomiting, unspecified: R11.2

## 2020-01-11 ENCOUNTER — Other Ambulatory Visit: Payer: Self-pay | Admitting: Nurse Practitioner

## 2020-01-15 LAB — BASIC METABOLIC PANEL
BUN/Creatinine Ratio: 15 (ref 10–24)
BUN: 18 mg/dL (ref 8–27)
CO2: 26 mmol/L (ref 20–29)
Calcium: 9.6 mg/dL (ref 8.6–10.2)
Chloride: 99 mmol/L (ref 96–106)
Creatinine, Ser: 1.2 mg/dL (ref 0.76–1.27)
GFR calc Af Amer: 74 mL/min/{1.73_m2} (ref >59)
GFR calc non Af Amer: 64 mL/min/{1.73_m2} (ref >59)
Glucose: 131 mg/dL — ABNORMAL HIGH (ref 65–99)
Potassium: 4.3 mmol/L (ref 3.5–5.2)
Sodium: 140 mmol/L (ref 134–144)

## 2020-01-15 LAB — CBC WITH DIFFERENTIAL/PLATELET
Basophils Absolute: 0.1 10*3/uL (ref 0.0–0.2)
Basos: 1 %
EOS (ABSOLUTE): 0.1 10*3/uL (ref 0.0–0.4)
Eos: 1 %
Hematocrit: 34 % — ABNORMAL LOW (ref 37.5–51.0)
Hemoglobin: 11.7 g/dL — ABNORMAL LOW (ref 13.0–17.7)
Immature Grans (Abs): 0.1 10*3/uL (ref 0.0–0.1)
Immature Granulocytes: 1 %
Lymphocytes Absolute: 1.7 10*3/uL (ref 0.7–3.1)
Lymphs: 22 %
MCH: 33.1 pg — ABNORMAL HIGH (ref 26.6–33.0)
MCHC: 34.4 g/dL (ref 31.5–35.7)
MCV: 96 fL (ref 79–97)
Monocytes Absolute: 0.6 10*3/uL (ref 0.1–0.9)
Monocytes: 8 %
Neutrophils Absolute: 5.1 10*3/uL (ref 1.4–7.0)
Neutrophils: 67 %
Platelets: 321 10*3/uL (ref 150–450)
RBC: 3.53 x10E6/uL — ABNORMAL LOW (ref 4.14–5.80)
RDW: 12.5 % (ref 11.6–15.4)
WBC: 7.5 10*3/uL (ref 3.4–10.8)

## 2020-01-15 LAB — LIPID PANEL
Chol/HDL Ratio: 2.3 ratio (ref 0.0–5.0)
Cholesterol, Total: 77 mg/dL — ABNORMAL LOW (ref 100–199)
HDL: 34 mg/dL — ABNORMAL LOW (ref >39)
LDL Chol Calc (NIH): 21 mg/dL (ref 0–99)
Triglycerides: 118 mg/dL (ref 0–149)
VLDL Cholesterol Cal: 22 mg/dL (ref 5–40)

## 2020-01-15 LAB — HEPATIC FUNCTION PANEL
ALT: 17 IU/L (ref 0–44)
AST: 19 IU/L (ref 0–40)
Albumin: 3.9 g/dL (ref 3.8–4.8)
Alkaline Phosphatase: 182 IU/L — ABNORMAL HIGH (ref 48–121)
Bilirubin Total: 0.3 mg/dL (ref 0.0–1.2)
Bilirubin, Direct: 0.16 mg/dL (ref 0.00–0.40)
Total Protein: 7.1 g/dL (ref 6.0–8.5)

## 2020-01-15 LAB — HEMOGLOBIN A1C
Est. average glucose Bld gHb Est-mCnc: 134 mg/dL
Hgb A1c MFr Bld: 6.3 % — ABNORMAL HIGH (ref 4.8–5.6)

## 2020-01-18 ENCOUNTER — Ambulatory Visit: Payer: 59 | Admitting: Family Medicine

## 2020-01-20 ENCOUNTER — Other Ambulatory Visit (HOSPITAL_COMMUNITY)
Admission: RE | Admit: 2020-01-20 | Discharge: 2020-01-20 | Disposition: A | Discharge status: Home / Self Care | Payer: 59 | Source: Ambulatory Visit | Attending: Internal Medicine | Admitting: Internal Medicine

## 2020-01-20 ENCOUNTER — Other Ambulatory Visit: Payer: Self-pay

## 2020-01-20 DIAGNOSIS — Z01812 Encounter for preprocedural laboratory examination: Secondary | ICD-10-CM | POA: Diagnosis present

## 2020-01-20 DIAGNOSIS — Z20822 Contact with and (suspected) exposure to covid-19: Secondary | ICD-10-CM | POA: Insufficient documentation

## 2020-01-20 LAB — SARS CORONAVIRUS 2 (TAT 6-24 HRS): SARS Coronavirus 2: NEGATIVE

## 2020-01-21 ENCOUNTER — Ambulatory Visit (HOSPITAL_COMMUNITY): Payer: 59 | Admitting: Anesthesiology

## 2020-01-21 ENCOUNTER — Encounter (HOSPITAL_COMMUNITY): Payer: Self-pay | Admitting: Internal Medicine

## 2020-01-21 ENCOUNTER — Encounter (HOSPITAL_COMMUNITY)
Admission: RE | Disposition: A | Discharge status: Home / Self Care | Payer: Self-pay | Source: Home / Self Care | Attending: Internal Medicine

## 2020-01-21 ENCOUNTER — Telehealth: Payer: Self-pay | Admitting: Internal Medicine

## 2020-01-21 ENCOUNTER — Ambulatory Visit (HOSPITAL_COMMUNITY)
Admission: RE | Admit: 2020-01-21 | Discharge: 2020-01-21 | Disposition: A | Discharge status: Home / Self Care | Payer: 59 | Attending: Internal Medicine | Admitting: Internal Medicine

## 2020-01-21 DIAGNOSIS — M1990 Unspecified osteoarthritis, unspecified site: Secondary | ICD-10-CM | POA: Insufficient documentation

## 2020-01-21 DIAGNOSIS — Z7984 Long term (current) use of oral hypoglycemic drugs: Secondary | ICD-10-CM | POA: Insufficient documentation

## 2020-01-21 DIAGNOSIS — Z7982 Long term (current) use of aspirin: Secondary | ICD-10-CM | POA: Diagnosis not present

## 2020-01-21 DIAGNOSIS — Z79899 Other long term (current) drug therapy: Secondary | ICD-10-CM | POA: Diagnosis not present

## 2020-01-21 DIAGNOSIS — D120 Benign neoplasm of cecum: Secondary | ICD-10-CM | POA: Diagnosis not present

## 2020-01-21 DIAGNOSIS — Z885 Allergy status to narcotic agent status: Secondary | ICD-10-CM | POA: Insufficient documentation

## 2020-01-21 DIAGNOSIS — G4730 Sleep apnea, unspecified: Secondary | ICD-10-CM | POA: Diagnosis not present

## 2020-01-21 DIAGNOSIS — K635 Polyp of colon: Secondary | ICD-10-CM | POA: Diagnosis not present

## 2020-01-21 DIAGNOSIS — E119 Type 2 diabetes mellitus without complications: Secondary | ICD-10-CM | POA: Insufficient documentation

## 2020-01-21 DIAGNOSIS — M109 Gout, unspecified: Secondary | ICD-10-CM | POA: Insufficient documentation

## 2020-01-21 DIAGNOSIS — Z87891 Personal history of nicotine dependence: Secondary | ICD-10-CM | POA: Insufficient documentation

## 2020-01-21 DIAGNOSIS — Z1211 Encounter for screening for malignant neoplasm of colon: Secondary | ICD-10-CM | POA: Diagnosis present

## 2020-01-21 DIAGNOSIS — E785 Hyperlipidemia, unspecified: Secondary | ICD-10-CM | POA: Diagnosis not present

## 2020-01-21 DIAGNOSIS — D128 Benign neoplasm of rectum: Secondary | ICD-10-CM | POA: Diagnosis not present

## 2020-01-21 DIAGNOSIS — R12 Heartburn: Secondary | ICD-10-CM | POA: Diagnosis not present

## 2020-01-21 DIAGNOSIS — E781 Pure hyperglyceridemia: Secondary | ICD-10-CM | POA: Diagnosis not present

## 2020-01-21 DIAGNOSIS — D123 Benign neoplasm of transverse colon: Secondary | ICD-10-CM | POA: Insufficient documentation

## 2020-01-21 DIAGNOSIS — I10 Essential (primary) hypertension: Secondary | ICD-10-CM | POA: Insufficient documentation

## 2020-01-21 DIAGNOSIS — K621 Rectal polyp: Secondary | ICD-10-CM | POA: Diagnosis not present

## 2020-01-21 DIAGNOSIS — K219 Gastro-esophageal reflux disease without esophagitis: Secondary | ICD-10-CM | POA: Insufficient documentation

## 2020-01-21 HISTORY — PX: COLONOSCOPY WITH PROPOFOL: SHX5780

## 2020-01-21 HISTORY — PX: POLYPECTOMY: SHX5525

## 2020-01-21 HISTORY — PX: ESOPHAGOGASTRODUODENOSCOPY (EGD) WITH PROPOFOL: SHX5813

## 2020-01-21 LAB — GLUCOSE, CAPILLARY: Glucose-Capillary: 127 mg/dL — ABNORMAL HIGH (ref 70–99)

## 2020-01-21 SURGERY — COLONOSCOPY WITH PROPOFOL
Anesthesia: General

## 2020-01-21 MED ORDER — CHLORHEXIDINE GLUCONATE CLOTH 2 % EX PADS: 6.0000 | MEDICATED_PAD | Freq: Once | CUTANEOUS | Status: DC

## 2020-01-21 MED ORDER — LACTATED RINGERS IV SOLN: INTRAVENOUS | Status: DC

## 2020-01-21 MED ORDER — LACTATED RINGERS IV SOLN: INTRAVENOUS | Status: DC | PRN

## 2020-01-21 MED ORDER — STERILE WATER FOR IRRIGATION IR SOLN
Status: DC | PRN
  Administered 2020-01-21: 1.5 mL

## 2020-01-21 MED ORDER — PROPOFOL 500 MG/50ML IV EMUL
INTRAVENOUS | Status: DC | PRN
  Administered 2020-01-21: 150 ug/kg/min via INTRAVENOUS

## 2020-01-21 MED ORDER — LIDOCAINE VISCOUS HCL 2 % MT SOLN: 15.0000 mL | Freq: Once | OROMUCOSAL | Status: AC

## 2020-01-21 MED ORDER — GLYCOPYRROLATE 0.2 MG/ML IJ SOLN: 0.1000 mg | Freq: Once | INTRAMUSCULAR | Status: AC

## 2020-01-21 MED ORDER — LIDOCAINE VISCOUS HCL 2 % MT SOLN
OROMUCOSAL | Status: AC
  Administered 2020-01-21: 15 mL via OROMUCOSAL
  Filled 2020-01-21: qty 15

## 2020-01-21 MED ORDER — GLYCOPYRROLATE 0.2 MG/ML IJ SOLN
INTRAMUSCULAR | Status: AC
  Administered 2020-01-21: 0.2 mg via INTRAVENOUS
  Filled 2020-01-21: qty 1

## 2020-01-21 MED ORDER — PROPOFOL 10 MG/ML IV BOLUS
INTRAVENOUS | Status: DC | PRN
  Administered 2020-01-21: 40 mg via INTRAVENOUS
  Administered 2020-01-21: 60 mg via INTRAVENOUS

## 2020-01-21 NOTE — Telephone Encounter (Signed)
Spoke with pt and spouse. Pt isn't actively bleeding from his rectum. Pt noticed blood when he had a BM. BM was rust colored. Pt is in no pain, no bloating, no vomiting and no abdominal swelling.

## 2020-01-21 NOTE — Op Note (Signed)
[facility] Patient Name: Austin Morgan Procedure Date: 01/21/2020 7:44 AM MRN: [id number] Date of Birth: [birth year] Attending MD: Norvel Richards , MD CSN: [id number] Age: 64 Admit Type: Outpatient Procedure:                Upper GI endoscopy Indications:              Heartburn Providers:                Norvel Richards, MD, Rosina Lowenstein, RN, Caprice Kluver, Aram Candela Referring MD:              Medicines:                Propofol per Anesthesia Complications:            No immediate complications. Estimated Blood Loss:     Estimated blood loss: none. Procedure:                Pre-Anesthesia Assessment:                           - Prior to the procedure, a History and Physical                            was performed, and patient medications and                            allergies were reviewed. The patient's tolerance of                            previous anesthesia was also reviewed. The risks                            and benefits of the procedure and the sedation                            options and risks were discussed with the patient.                            All questions were answered, and informed consent                            was obtained. Prior Anticoagulants: The patient has                            taken no previous anticoagulant or antiplatelet                            agents. ASA Grade Assessment: II - A patient with                            mild systemic disease. After reviewing the risks  and benefits, the patient was deemed in                            satisfactory condition to undergo the procedure.                           After obtaining informed consent, the endoscope was                            passed under direct vision. Throughout the                            procedure, the patient's blood pressure, pulse, and                            oxygen saturations were monitored  continuously. The                            [id number]-[id number] ([id number]) scope was introduced through the                            mouth, and advanced to the second part of duodenum.                            The upper GI endoscopy was accomplished without                            difficulty. The patient tolerated the procedure                            well. Scope In: 8:18:56 AM Scope Out: 8:21:56 AM Total Procedure Duration: 0 hours 3 minutes 0 seconds  Findings:      The examined esophagus was normal.      The entire examined stomach was normal.      The duodenal bulb and second portion of the duodenum were normal. Impression:               - Normal esophagus.                           - Normal stomach.                           - Normal duodenal bulb and second portion of the                            duodenum.                           - No specimens collected. Moderate Sedation:      Moderate (conscious) sedation was personally administered by an       anesthesia professional. The following parameters were monitored: oxygen       saturation, heart rate, blood pressure, respiratory rate, EKG, adequacy       of pulmonary ventilation, and response to care. Recommendation:           -  Patient has a contact number available for                            emergencies. The signs and symptoms of potential                            delayed complications were discussed with the                            patient. Return to normal activities tomorrow.                            Written discharge instructions were provided to the                            patient.                           - Resume previous diet.                           - Continue present medications. However, stop OTC                            Nexium; trial of Protonix 40 mg once daily                           - Return to my office in 3 months. Office visit                            with [place] in 3 months Procedure  Code(s):        --- Professional ---                           [phone], Esophagogastroduodenoscopy, flexible,                            transoral; diagnostic, including collection of                            specimen(s) by brushing or washing, when performed                            (separate procedure) Diagnosis Code(s):        --- Professional ---                           R12, Heartburn CPT copyright 2019 American Medical Association. All rights reserved. The codes documented in this report are preliminary and upon coder review may  be revised to meet current compliance requirements. Cristopher Estimable. Haydn Hutsell, MD Norvel Richards, MD 01/21/2020 8:26:13 AM This report has been signed electronically. Number of Addenda: 0

## 2020-01-21 NOTE — Anesthesia Postprocedure Evaluation (Signed)
Anesthesia Post Note  Patient: Austin Morgan  Procedure(s) Performed: COLONOSCOPY WITH PROPOFOL (N/A ) ESOPHAGOGASTRODUODENOSCOPY (EGD) WITH PROPOFOL (N/A ) POLYPECTOMY  Patient location during evaluation: Endoscopy Anesthesia Type: General Level of consciousness: awake and alert and patient cooperative Pain management: satisfactory to patient Vital Signs Assessment: post-procedure vital signs reviewed and stable Respiratory status: spontaneous breathing Cardiovascular status: stable Postop Assessment: no apparent nausea or vomiting Anesthetic complications: no   No complications documented.   Last Vitals:  Vitals:   01/21/20 0720  BP: 126/89  Pulse: 98  Resp: 19  Temp: 37.1 C  SpO2: 98%    Last Pain:  Vitals:   01/21/20 0811  TempSrc:   PainSc: 0-No pain                 Shamra Bradeen

## 2020-01-21 NOTE — H&P (Signed)
@LOGO @   Primary Care Physician:  Kathyrn Drown, MD Primary Gastroenterologist:  Dr. Gala Romney  Pre-Procedure History & Physical: HPI:  Austin Morgan is a 64 y.o. male here for here for further evaluation of longstanding GERD treated with OTC omeprazole as well as 10-year average rescreening colonoscopy.  Last colonoscopy 10 years ago-diverticulosis-elsewhere.  No bowel symptoms currently.  No dysphagia.  Patient doing very well from a reflux standpoint now on Nexium 20 mg twice daily.  He does take omeprazole 20 mg orally twice daily.   Past Medical History:  Diagnosis Date  . Arthritis   . Diabetes mellitus without complication ([place])   . ED (erectile dysfunction)   . Gout   . H/O ETOH abuse   . Hyperlipidemia   . Hypertension   . Hypertriglyceridemia   . PONV (postoperative nausea and vomiting)   . Sleep apnea     Past Surgical History:  Procedure Laterality Date  . CARPAL TUNNEL RELEASE Bilateral   . COLONOSCOPY  2010   Dr. Delfin Edis: mild diverticulosis, next colonoscopy 2020  . NECK SURGERY     c4    Prior to Admission medications   Medication Sig Start Date End Date Taking? Authorizing Provider  allopurinol (ZYLOPRIM) 300 MG tablet TAKE 1 TABLET BY MOUTH TWO  TIMES DAILY Patient taking differently: Take 300 mg by mouth 2 (two) times daily. Take 1 tablet by mouth two  times daily 07/28/18  Yes Luking, Elayne Snare, MD  Ascorbic Acid (VITAMIN C) 500 MG CAPS Take 500 mg by mouth daily.   Yes [provider]  aspirin 81 MG tablet Take 81 mg by mouth daily.   Yes [provider]  atorvastatin (LIPITOR) 40 MG tablet TAKE 1 TABLET BY MOUTH  DAILY Patient taking differently: Take 40 mg by mouth daily.  08/25/18  Yes Luking, Elayne Snare, MD  Coenzyme Q10 (COQ10) 100 MG CAPS Take 100 mg by mouth daily.   Yes [provider]  colchicine 0.6 MG tablet Take 1 tablet (0.6 mg total) by mouth 2 (two) times daily. Patient taking differently: Take 0.6 mg by mouth 2  (two) times daily as needed (gout).  05/11/15  Yes Kathyrn Drown, MD  cyclobenzaprine (FLEXERIL) 10 MG tablet Take 1/2-1 tab po TID prn back pain; may cause drowsiness Patient taking differently: Take 5-10 mg by mouth 3 (three) times daily as needed for muscle spasms. Take 1/2-1 tab po TID prn back pain; may cause drowsiness 12/25/19  Yes Nilda Simmer, NP  fish oil-omega-3 fatty acids 1000 MG capsule Take 1 g by mouth 2 (two) times daily.    Yes [provider]  glipiZIDE (GLUCOTROL) 5 MG tablet Take 5 mg by mouth 2 (two) times daily. 08/12/19  Yes [provider]  hydrochlorothiazide (HYDRODIURIL) 25 MG tablet TAKE ONE TABLET BY MOUTH ONCE DAILY. Patient taking differently: Take 25 mg by mouth daily.  11/13/19  Yes Pearson Forster C, NP  lisinopril (ZESTRIL) 5 MG tablet TAKE (1) TABLET BY MOUTH ONCE DAILY. Patient taking differently: Take 5 mg by mouth daily.  11/13/19  Yes Nilda Simmer, NP  metFORMIN (GLUCOPHAGE) 500 MG tablet TAKE (1) TABLET BY MOUTH TWICE DAILY. 01/05/20  Yes Kathyrn Drown, MD  Multiple Vitamins-Minerals (MULTIVITAMIN WITH MINERALS) tablet Take 1 tablet by mouth daily.   Yes [provider]  nabumetone (RELAFEN) 500 MG tablet Take 1 tablet (500 mg total) by mouth 2 (two) times daily as needed. For back pain 12/25/19  Yes Pearson Forster C, NP  naltrexone (DEPADE) 50 MG tablet TAKE ONE TABLET BY MOUTH ONCE DAILY. Patient taking differently: Take 50 mg by mouth daily. TAKE ONE TABLET BY MOUTH ONCE DAILY. 11/02/19  Yes Nilda Simmer, NP  naproxen sodium (ANAPROX) 220 MG tablet Take 220 mg by mouth daily as needed (pain).    Yes [provider]  omeprazole (PRILOSEC) 20 MG capsule Take 20 mg by mouth in the morning and at bedtime.    Yes [provider]  OVER THE COUNTER MEDICATION Take 2 capsules by mouth with breakfast, with lunch, and with evening meal. Balance of Nature   Yes [provider]  pramipexole  (MIRAPEX) 0.125 MG tablet Take one tab po qhs at least 2 hours before bedtime for restless legs Patient taking differently: Take 0.125 mg by mouth at bedtime as needed (RLS). Take one tab po qhs at least 2 hours before bedtime for restless legs 04/17/19  Yes Pearson Forster C, NP  traZODone (DESYREL) 100 MG tablet TAKE 1 TABLET AT BEDTIME AS NEEDED FOR SLEEP. 01/11/20  Yes Luking, Elayne Snare, MD  TRULICITY 8.91 QX/4.5WT SOPN INJECT 1 SYRINGE SUBCUTANEOUSLY ONCE A WEEK (ON THE SAME DAY OF EACH WEEK) AS DIRECTED. Patient taking differently: Inject 0.75 mg into the skin every Monday. INJECT 1 SYRINGE SUBCUTANEOUSLY ONCE A WEEK (ON THE SAME DAY OF EACH WEEK) AS DIRECTED. 12/16/19  Yes Luking, Elayne Snare, MD  blood glucose meter kit and supplies KIT Dispense based on patient and insurance preference. Use up to BID as directed. 03/20/19   Nilda Simmer, NP  Na Sulfate-K Sulfate-Mg Sulf (SUPREP BOWEL PREP KIT) 17.5-3.13-1.6 GM/177ML SOLN Take 1 kit by mouth as directed. Patient not taking: Reported on 12/28/2019 12/17/19   [name], MD  ondansetron (ZOFRAN ODT) 8 MG disintegrating tablet Take 1 tablet (8 mg total) by mouth every 8 (eight) hours as needed for nausea or vomiting. Patient not taking: Reported on 12/28/2019 05/05/18   Kathyrn Drown, MD  [facility] ULTRA test strip USE AS DIRECTED TO TEST BLOOD GLUCOSE TWICE DAILY. 10/02/19   Kathyrn Drown, MD  tadalafil (CIALIS) 20 MG tablet Take one tab every other day as needed for erectile dysfunction 09/18/19   Nilda Simmer, NP    Allergies as of 12/17/2019 - Review Complete 10/14/2019  Allergen Reaction Noted  . Codeine  11/26/2008    Family History  Problem Relation Age of Onset  . Heart attack Father   . Colon cancer Neg Hx     Social History   Socioeconomic History  . Marital status: Married    Spouse name: Not on file  . Number of children: Not on file  . Years of education: Not on file  . Highest education level: Not on file   Occupational History  . Not on file  Tobacco Use  . Smoking status: Former Smoker    Packs/day: 1.00    Years: 36.00    Pack years: 36.00    Types: Cigarettes    Quit date: 03/11/2016    Years since quitting: 3.8  . Smokeless tobacco: Never Used  Vaping Use  . Vaping Use: Never used  Substance and Sexual Activity  . Alcohol use: Not Currently    Comment: drinks a fifth of liquor per week; inpatient rehab 08/2019. denied 10/14/19  . Drug use: No  . Sexual activity: Yes  Other Topics Concern  . Not on file  Social History Narrative  Wife, Dominica Severin NP.   Social Determinants of Health   Financial Resource Strain:   . Difficulty of Paying Living Expenses:   Food Insecurity:   . Worried About Charity fundraiser in the Last Year:   . Arboriculturist in the Last Year:   Transportation Needs:   . Film/video editor (Medical):   Marland Kitchen Lack of Transportation (Non-Medical):   Physical Activity:   . Days of Exercise per Week:   . Minutes of Exercise per Session:   Stress:   . Feeling of Stress :   Social Connections:   . Frequency of Communication with Friends and Family:   . Frequency of Social Gatherings with Friends and Family:   . Attends Religious Services:   . Active Member of Clubs or Organizations:   . Attends Archivist Meetings:   Marland Kitchen Marital Status:   Intimate Partner Violence:   . Fear of Current or Ex-Partner:   . Emotionally Abused:   Marland Kitchen Physically Abused:   . Sexually Abused:     Review of Systems: See HPI, otherwise negative ROS  Physical Exam: BP 126/89   Pulse 98   Temp 98.8 F (37.1 C) (Oral)   Resp 19   Ht 5' 7"  (1.702 m)   Wt 83.9 kg   SpO2 98%   BMI 28.98 kg/m  General:   Alert,  Well-developed, well-nourished, pleasant and cooperative in NAD Mouth:  No deformity or lesions. Neck:  Supple; no masses or thyromegaly. No significant cervical adenopathy. Lungs:  Clear throughout to auscultation.   No wheezes, crackles, or rhonchi.  No acute distress. Heart:  Regular rate and rhythm; no murmurs, clicks, rubs,  or gallops. Abdomen: Non-distended, normal bowel sounds.  Soft and nontender without appreciable mass or hepatosplenomegaly.  Pulses:  Normal pulses noted. Extremities:  Without clubbing or edema.  Impression/Plan: 64 year old gentleman longstanding GERD no alarm symptoms.  Symptoms are controlled requires PPI daily.  No prior upper valuation.  Here for routine average rescreening colonoscopy as well.  I will offer the patient both an EGD and a colonoscopy today per plan.  The risks, benefits, limitations, imponderables and alternatives regarding both EGD and colonoscopy have been reviewed with the patient. Questions have been answered. All parties agreeable.      Notice: This dictation was prepared with Dragon dictation along with smaller phrase technology. Any transcriptional errors that result from this process are unintentional and may not be corrected upon review.

## 2020-01-21 NOTE — Anesthesia Preprocedure Evaluation (Signed)
Anesthesia Evaluation  Patient identified by MRN, date of birth, ID band Patient awake    Reviewed: Allergy & Precautions, H&P , NPO status , Patient's Chart, lab work & pertinent test results, reviewed documented beta blocker date and time   History of Anesthesia Complications (+) PROLONGED EMERGENCE and history of anesthetic complications  Airway Mallampati: II  TM Distance: >3 FB Neck ROM: full    Dental no notable dental hx.    Pulmonary sleep apnea , former smoker,    Pulmonary exam normal breath sounds clear to auscultation       Cardiovascular Exercise Tolerance: Good hypertension, negative cardio ROS   Rhythm:regular Rate:Normal     Neuro/Psych PSYCHIATRIC DISORDERS Anxiety Depression negative neurological ROS     GI/Hepatic Neg liver ROS, GERD  Medicated,  Endo/Other  negative endocrine ROSdiabetes  Renal/GU negative Renal ROS  negative genitourinary   Musculoskeletal   Abdominal   Peds  Hematology  (+) Blood dyscrasia, anemia ,   Anesthesia Other Findings   Reproductive/Obstetrics negative OB ROS                             Anesthesia Physical Anesthesia Plan  ASA: III  Anesthesia Plan: General   Post-op Pain Management:    Induction:   PONV Risk Score and Plan: 2 and Propofol infusion  Airway Management Planned:   Additional Equipment:   Intra-op Plan:   Post-operative Plan:   Informed Consent: I have reviewed the patients History and Physical, chart, labs and discussed the procedure including the risks, benefits and alternatives for the proposed anesthesia with the patient or authorized representative who has indicated his/her understanding and acceptance.     Dental Advisory Given  Plan Discussed with: CRNA  Anesthesia Plan Comments:         Anesthesia Quick Evaluation

## 2020-01-21 NOTE — Transfer of Care (Signed)
Immediate Anesthesia Transfer of Care Note  Patient: Austin Morgan  Procedure(s) Performed: COLONOSCOPY WITH PROPOFOL (N/A ) ESOPHAGOGASTRODUODENOSCOPY (EGD) WITH PROPOFOL (N/A ) POLYPECTOMY  Patient Location: PACU  Anesthesia Type:General  Level of Consciousness: awake, alert  and patient cooperative  Airway & Oxygen Therapy: Patient Spontanous Breathing  Post-op Assessment: Report given to RN and Post -op Vital signs reviewed and stable  Post vital signs: Reviewed and stable  Last Vitals:  Vitals Value Taken Time  BP    Temp 98   Pulse    Resp    SpO2      Last Pain:  Vitals:   01/21/20 0811  TempSrc:   PainSc: 0-No pain         Complications: No complications documented.

## 2020-01-21 NOTE — Op Note (Signed)
[facility] Patient Name: Austin Morgan Procedure Date: 01/21/2020 8:24 AM MRN: [id number] Date of Birth: [birth year] Attending MD: Norvel Richards , MD CSN: [id number] Age: 64 Admit Type: Outpatient Procedure:                Colonoscopy Indications:              Screening for colorectal malignant neoplasm Providers:                Norvel Richards, MD, Rosina Lowenstein, RN, Aram Candela Referring MD:              Medicines:                Propofol per Anesthesia Complications:            No immediate complications. Estimated Blood Loss:     Estimated blood loss was minimal. Procedure:                Pre-Anesthesia Assessment:                           - Prior to the procedure, a History and Physical                            was performed, and patient medications and                            allergies were reviewed. The patient's tolerance of                            previous anesthesia was also reviewed. The risks                            and benefits of the procedure and the sedation                            options and risks were discussed with the patient.                            All questions were answered, and informed consent                            was obtained. Prior Anticoagulants: The patient has                            taken no previous anticoagulant or antiplatelet                            agents. ASA Grade Assessment: II - A patient with                            mild systemic disease. After reviewing the risks  and benefits, the patient was deemed in                            satisfactory condition to undergo the procedure.                           After obtaining informed consent, the colonoscope                            was passed under direct vision. Throughout the                            procedure, the patient's blood pressure, pulse, and                            oxygen  saturations were monitored continuously. The                            [id number] ([id number] scope was introduced through                            the anus and advanced to the the cecum, identified                            by appendiceal orifice and ileocecal valve. The                            colonoscopy was performed without difficulty. The                            patient tolerated the procedure well. The quality                            of the bowel preparation was adequate. Scope In: 8:27:39 AM Scope Out: 8:51:50 AM Scope Withdrawal Time: 0 hours 16 minutes 8 seconds  Total Procedure Duration: 0 hours 24 minutes 11 seconds  Findings:      Nine semi-pedunculated polyps were found in the rectum, splenic flexure       and cecum. The polyps were 3 to 9 mm in size. These polyps were removed       with a cold snare. Resection and retrieval were complete. Estimated       blood loss was minimal.      The exam was otherwise without abnormality on direct and retroflexion       views. Impression:               - Nine 3 to 9 mm polyps in the rectum, at the                            splenic flexure and in the cecum, removed with a                            cold snare. Resected and retrieved.                           -  The examination was otherwise normal on direct                            and retroflexion views. Moderate Sedation:      Moderate (conscious) sedation was personally administered by an       anesthesia professional. The following parameters were monitored: oxygen       saturation, heart rate, blood pressure, and response to care. Recommendation:           - Patient has a contact number available for                            emergencies. The signs and symptoms of potential                            delayed complications were discussed with the                            patient. Return to normal activities tomorrow.                            Written discharge  instructions were provided to the                            patient.                           - Resume previous diet.                           - Continue present medications.                           - Repeat colonoscopy date to be determined after                            pending pathology results are reviewed for                            surveillance.                           - Return to GI office in 3 months. See EGD report Procedure Code(s):        --- Professional ---                           [id number], Colonoscopy, flexible; with removal of                            tumor(s), polyp(s), or other lesion(s) by snare                            technique Diagnosis Code(s):        --- Professional ---                           Z12.11,  Encounter for screening for malignant                            neoplasm of colon                           K62.1, Rectal polyp                           K63.5, Polyp of colon CPT copyright 2019 American Medical Association. All rights reserved. The codes documented in this report are preliminary and upon coder review may  be revised to meet current compliance requirements. Cristopher Estimable. Vernita Tague, MD Norvel Richards, MD 01/21/2020 8:55:53 AM This report has been signed electronically. Number of Addenda: 0

## 2020-01-21 NOTE — Telephone Encounter (Signed)
Noted. Spoke with pt and he is aware that he may continue to pass a little blood in his stool over the next couple of days.

## 2020-01-21 NOTE — Discharge Instructions (Signed)
°Colonoscopy °Discharge Instructions ° °Read the instructions outlined below and refer to this sheet in the next few weeks. These discharge instructions provide you with general information on caring for yourself after you leave the hospital. Your doctor may also give you specific instructions. While your treatment has been planned according to the most current medical practices available, unavoidable complications occasionally occur. If you have any problems or questions after discharge, call Dr. Rourk at [phone]. °ACTIVITY °· You may resume your regular activity, but move at a slower pace for the next 24 hours.  °· Take frequent rest periods for the next 24 hours.  °· Walking will help get rid of the air and reduce the bloated feeling in your belly (abdomen).  °· No driving for 24 hours (because of the medicine (anesthesia) used during the test).   °· Do not sign any important legal documents or operate any machinery for 24 hours (because of the anesthesia used during the test).  °NUTRITION °· Drink plenty of fluids.  °· You may resume your normal diet as instructed by your doctor.  °· Begin with a light meal and progress to your normal diet. Heavy or fried foods are harder to digest and may make you feel sick to your stomach (nauseated).  °· Avoid alcoholic beverages for 24 hours or as instructed.  °MEDICATIONS °· You may resume your normal medications unless your doctor tells you otherwise.  °WHAT YOU CAN EXPECT TODAY °· Some feelings of bloating in the abdomen.  °· Passage of more gas than usual.  °· Spotting of blood in your stool or on the toilet paper.  °IF YOU HAD POLYPS REMOVED DURING THE COLONOSCOPY: °· No aspirin products for 7 days or as instructed.  °· No alcohol for 7 days or as instructed.  °· Eat a soft diet for the next 24 hours.  °FINDING OUT THE RESULTS OF YOUR TEST °Not all test results are available during your visit. If your test results are not back during the visit, make an appointment  with your caregiver to find out the results. Do not assume everything is normal if you have not heard from your caregiver or the medical facility. It is important for you to follow up on all of your test results.  °SEEK IMMEDIATE MEDICAL ATTENTION IF: °· You have more than a spotting of blood in your stool.  °· Your belly is swollen (abdominal distention).  °· You are nauseated or vomiting.  °· You have a temperature over 101.  °· You have abdominal pain or discomfort that is severe or gets worse throughout the day.  °EGD °Discharge instructions °Please read the instructions outlined below and refer to this sheet in the next few weeks. These discharge instructions provide you with general information on caring for yourself after you leave the hospital. Your doctor may also give you specific instructions. While your treatment has been planned according to the most current medical practices available, unavoidable complications occasionally occur. If you have any problems or questions after discharge, please call your doctor. °ACTIVITY °· You may resume your regular activity but move at a slower pace for the next 24 hours.  °· Take frequent rest periods for the next 24 hours.  °· Walking will help expel (get rid of) the air and reduce the bloated feeling in your abdomen.  °· No driving for 24 hours (because of the anesthesia (medicine) used during the test).  °· You may shower.  °· Do not sign any important   legal documents or operate any machinery for 24 hours (because of the anesthesia used during the test).  NUTRITION  Drink plenty of fluids.   You may resume your normal diet.   Begin with a light meal and progress to your normal diet.   Avoid alcoholic beverages for 24 hours or as instructed by your caregiver.  MEDICATIONS  You may resume your normal medications unless your caregiver tells you otherwise.  WHAT YOU CAN EXPECT TODAY  You may experience abdominal discomfort such as a feeling of fullness  or gas pains.  FOLLOW-UP  Your doctor will discuss the results of your test with you.  SEEK IMMEDIATE MEDICAL ATTENTION IF ANY OF THE FOLLOWING OCCUR:  Excessive nausea (feeling sick to your stomach) and/or vomiting.   Severe abdominal pain and distention (swelling).   Trouble swallowing.   Temperature over 101 F (37.8 C).   Rectal bleeding or vomiting of blood.    9 polyps removed from your colon today  Your upper GI tract looked good  Stop Nexium; begin prescription Protonix 40 mg daily  Further recommendations to follow pending review of pathology report  Office visit with [place] in 3 months  At patient request, I called Rhonda at [phone] and left a message with the details

## 2020-01-21 NOTE — Telephone Encounter (Signed)
Let patient know in his case, I would not be surprised if he passes a little blood between now and tomorrow with bowel movements but it should taper off.  This is related to the 9 polyps that were removed today.  He should be fine.

## 2020-01-21 NOTE — Telephone Encounter (Signed)
Pt's wife called to say patient had colonoscopy today by Dr Gala Romney and said he was passing "old blood" and having "rust" colored stools. Is this normal? Please call [phone]

## 2020-01-22 ENCOUNTER — Encounter: Payer: Self-pay | Admitting: Internal Medicine

## 2020-01-22 LAB — SURGICAL PATHOLOGY

## 2020-01-26 ENCOUNTER — Encounter (HOSPITAL_COMMUNITY): Payer: Self-pay | Admitting: Internal Medicine

## 2020-02-02 ENCOUNTER — Encounter: Payer: Self-pay | Admitting: Family Medicine

## 2020-02-02 ENCOUNTER — Other Ambulatory Visit: Payer: Self-pay

## 2020-02-02 ENCOUNTER — Ambulatory Visit (INDEPENDENT_AMBULATORY_CARE_PROVIDER_SITE_OTHER): Payer: 59 | Admitting: Family Medicine

## 2020-02-02 VITALS — BP 124/86 | HR 115 | Temp 98.3°F | Ht 67.0 in | Wt 178.0 lb

## 2020-02-02 DIAGNOSIS — M109 Gout, unspecified: Secondary | ICD-10-CM

## 2020-02-02 DIAGNOSIS — E119 Type 2 diabetes mellitus without complications: Secondary | ICD-10-CM | POA: Diagnosis not present

## 2020-02-02 DIAGNOSIS — Z125 Encounter for screening for malignant neoplasm of prostate: Secondary | ICD-10-CM

## 2020-02-02 DIAGNOSIS — I10 Essential (primary) hypertension: Secondary | ICD-10-CM

## 2020-02-02 DIAGNOSIS — E785 Hyperlipidemia, unspecified: Secondary | ICD-10-CM | POA: Diagnosis not present

## 2020-02-02 MED ORDER — TRULICITY 0.75 MG/0.5ML ~~LOC~~ SOAJ: 0.7500 mg | SUBCUTANEOUS | 5 refills | Status: DC

## 2020-02-02 MED ORDER — TRAZODONE HCL 100 MG PO TABS: 100.0000 mg | ORAL_TABLET | Freq: Every evening | ORAL | 5 refills | Status: DC | PRN

## 2020-02-02 MED ORDER — METFORMIN HCL 500 MG PO TABS: ORAL_TABLET | ORAL | 1 refills | Status: DC

## 2020-02-02 MED ORDER — GLIPIZIDE 5 MG PO TABS: 5.0000 mg | ORAL_TABLET | Freq: Two times a day (BID) | ORAL | 1 refills | Status: DC

## 2020-02-02 MED ORDER — PRAMIPEXOLE DIHYDROCHLORIDE 0.125 MG PO TABS: ORAL_TABLET | ORAL | 5 refills | Status: DC

## 2020-02-02 MED ORDER — ALLOPURINOL 300 MG PO TABS: 300.0000 mg | ORAL_TABLET | Freq: Two times a day (BID) | ORAL | 1 refills | Status: DC

## 2020-02-02 MED ORDER — TADALAFIL 20 MG PO TABS: ORAL_TABLET | ORAL | 5 refills | Status: DC

## 2020-02-02 MED ORDER — NALTREXONE HCL 50 MG PO TABS: ORAL_TABLET | ORAL | 1 refills | Status: DC

## 2020-02-02 MED ORDER — HYDROCHLOROTHIAZIDE 25 MG PO TABS: 25.0000 mg | ORAL_TABLET | Freq: Every day | ORAL | 1 refills | Status: DC

## 2020-02-02 MED ORDER — LISINOPRIL 5 MG PO TABS: ORAL_TABLET | ORAL | 1 refills | Status: DC

## 2020-02-02 NOTE — Progress Notes (Signed)
° °  Subjective:    Patient ID: Austin Morgan, male    DOB: 1956-05-31, 64 y.o.   MRN: [id number]  Diabetes He presents for his follow-up diabetic visit. He has type 2 diabetes mellitus. Pertinent negatives for hypoglycemia include no confusion, dizziness or headaches. Pertinent negatives for diabetes include no chest pain, no fatigue and no weakness. Home blood sugar record trend: pt states glucose is running good. last a1c 6.3 done this month. Eye exam is current.   Patient has history of gout states under good control with his medicine denies any flareups recently  Taking his cholesterol medicine regular basis.  Tolerating well.  Blood pressure issues takes his medicine states under good control.  Reflux good control requests pantoprazole states it does well for him  Restless legs medicine does well uses it on a as needed basis  Insomnia issues uses trazodone doing well with this denies being depressed Is taking his naltrexone in order to help keep alcohol use in control he states that is under good control   Review of Systems  Constitutional: Negative for activity change, diaphoresis and fatigue.  HENT: Negative for congestion and rhinorrhea.   Respiratory: Negative for cough and shortness of breath.   Cardiovascular: Negative for chest pain and leg swelling.  Gastrointestinal: Negative for abdominal pain, diarrhea, nausea and vomiting.  Genitourinary: Negative for dysuria and hematuria.  Skin: Negative for color change and rash.  Neurological: Negative for dizziness, weakness and headaches.  Psychiatric/Behavioral: Negative for behavioral problems and confusion.       Objective:   Physical Exam Vitals reviewed.  Constitutional:      General: He is not in acute distress. HENT:     Head: Normocephalic and atraumatic.  Eyes:     General:        Right eye: No discharge.        Left eye: No discharge.  Neck:     Trachea: No tracheal deviation.  Cardiovascular:     Rate and  Rhythm: Normal rate and regular rhythm.     Heart sounds: Normal heart sounds. No murmur heard.   Pulmonary:     Effort: Pulmonary effort is normal. No respiratory distress.     Breath sounds: Normal breath sounds.  Lymphadenopathy:     Cervical: No cervical adenopathy.  Skin:    General: Skin is warm and dry.  Neurological:     Mental Status: He is alert.     Coordination: Coordination normal.  Psychiatric:        Behavior: Behavior normal.           Assessment & Plan:  1. Screening for prostate cancer PSA ordered - PSA  2. Gout, unspecified cause, unspecified chronicity, unspecified site Continue healthy diet avoid alcohol check uric acid level continue medication - Uric acid  3. Dyslipidemia (high LDL; low HDL) Cholesterol good control continue current measures  4. Diabetes mellitus without complication ([place]) Diabetes good control continue current measures  5. Essential hypertension Blood pressure good control continue current measures  Doing good job of avoiding alcohol states occasionally he does utilize it.  But states overall he feels things are going well  Insomnia good control continue use of trazodone  Restless legs continues on medication on a as needed basis

## 2020-04-20 ENCOUNTER — Ambulatory Visit: Payer: 59 | Admitting: Gastroenterology

## 2020-05-17 ENCOUNTER — Other Ambulatory Visit: Payer: Self-pay

## 2020-05-17 ENCOUNTER — Encounter: Payer: Self-pay | Admitting: Gastroenterology

## 2020-05-17 ENCOUNTER — Ambulatory Visit (INDEPENDENT_AMBULATORY_CARE_PROVIDER_SITE_OTHER): Payer: 59 | Admitting: Gastroenterology

## 2020-05-17 VITALS — BP 114/77 | HR 90 | Temp 96.8°F | Ht 67.0 in | Wt 187.6 lb

## 2020-05-17 DIAGNOSIS — K219 Gastro-esophageal reflux disease without esophagitis: Secondary | ICD-10-CM | POA: Diagnosis not present

## 2020-05-17 DIAGNOSIS — R748 Abnormal levels of other serum enzymes: Secondary | ICD-10-CM | POA: Diagnosis not present

## 2020-05-17 DIAGNOSIS — D649 Anemia, unspecified: Secondary | ICD-10-CM

## 2020-05-17 NOTE — Progress Notes (Signed)
Cc'e to pcp 

## 2020-05-17 NOTE — Patient Instructions (Addendum)
1. Please start taking your pantoprazole 30 minutes before your evening meal to see if it will help with our morning coughing of phlegm (which could be reflux related). If you notice more daytime heartburn with this change in timing of your pantoprazole, please let me know.  2. We will update your liver labs and evaluate the elevated alkaline phosphatase.  3. Refrain from alcohol use.  4. Return to the office in six months.    Gastroesophageal Reflux Disease, Adult Gastroesophageal reflux (GER) happens when acid from the stomach flows up into the tube that connects the mouth and the stomach (esophagus). Normally, food travels down the esophagus and stays in the stomach to be digested. However, when a person has GER, food and stomach acid sometimes move back up into the esophagus. If this becomes a more serious problem, the person may be diagnosed with a disease called gastroesophageal reflux disease (GERD). GERD occurs when the reflux:  Happens often.  Causes frequent or severe symptoms.  Causes problems such as damage to the esophagus. When stomach acid comes in contact with the esophagus, the acid may cause soreness (inflammation) in the esophagus. Over time, GERD may create small holes (ulcers) in the lining of the esophagus. What are the causes? This condition is caused by a problem with the muscle between the esophagus and the stomach (lower esophageal sphincter, or LES). Normally, the LES muscle closes after food passes through the esophagus to the stomach. When the LES is weakened or abnormal, it does not close properly, and that allows food and stomach acid to go back up into the esophagus. The LES can be weakened by certain dietary substances, medicines, and medical conditions, including:  Tobacco use.  Pregnancy.  Having a hiatal hernia.  Alcohol use.  Certain foods and beverages, such as coffee, chocolate, onions, and peppermint. What increases the risk? You are more likely to  develop this condition if you:  Have an increased body weight.  Have a connective tissue disorder.  Use NSAID medicines. What are the signs or symptoms? Symptoms of this condition include:  Heartburn.  Difficult or painful swallowing.  The feeling of having a lump in the throat.  Abitter taste in the mouth.  Bad breath.  Having a large amount of saliva.  Having an upset or bloated stomach.  Belching.  Chest pain. Different conditions can cause chest pain. Make sure you see your health care provider if you experience chest pain.  Shortness of breath or wheezing.  Ongoing (chronic) cough or a night-time cough.  Wearing away of tooth enamel.  Weight loss. How is this diagnosed? Your health care provider will take a medical history and perform a physical exam. To determine if you have mild or severe GERD, your health care provider may also monitor how you respond to treatment. You may also have tests, including:  A test to examine your stomach and esophagus with a small camera (endoscopy).  A test thatmeasures the acidity level in your esophagus.  A test thatmeasures how much pressure is on your esophagus.  A barium swallow or modified barium swallow test to show the shape, size, and functioning of your esophagus. How is this treated? The goal of treatment is to help relieve your symptoms and to prevent complications. Treatment for this condition may vary depending on how severe your symptoms are. Your health care provider may recommend:  Changes to your diet.  Medicine.  Surgery. Follow these instructions at home: Eating and drinking   Follow  a diet as recommended by your health care provider. This may involve avoiding foods and drinks such as: ? Coffee and tea (with or without caffeine). ? Drinks that containalcohol. ? Energy drinks and sports drinks. ? Carbonated drinks or sodas. ? Chocolate and cocoa. ? Peppermint and mint flavorings. ? Garlic and  onions. ? Horseradish. ? Spicy and acidic foods, including peppers, chili powder, curry powder, vinegar, hot sauces, and barbecue sauce. ? Citrus fruit juices and citrus fruits, such as oranges, lemons, and limes. ? Tomato-based foods, such as red sauce, chili, salsa, and pizza with red sauce. ? Fried and fatty foods, such as donuts, french fries, potato chips, and high-fat dressings. ? High-fat meats, such as hot dogs and fatty cuts of red and white meats, such as rib eye steak, sausage, ham, and bacon. ? High-fat dairy items, such as whole milk, butter, and cream cheese.  Eat small, frequent meals instead of large meals.  Avoid drinking large amounts of liquid with your meals.  Avoid eating meals during the 2-3 hours before bedtime.  Avoid lying down right after you eat.  Do not exercise right after you eat. Lifestyle   Do not use any products that contain nicotine or tobacco, such as cigarettes, e-cigarettes, and chewing tobacco. If you need help quitting, ask your health care provider.  Try to reduce your stress by using methods such as yoga or meditation. If you need help reducing stress, ask your health care provider.  If you are overweight, reduce your weight to an amount that is healthy for you. Ask your health care provider for guidance about a safe weight loss goal. General instructions  Pay attention to any changes in your symptoms.  Take over-the-counter and prescription medicines only as told by your health care provider. Do not take aspirin, ibuprofen, or other NSAIDs unless your health care provider told you to do so.  Wear loose-fitting clothing. Do not wear anything tight around your waist that causes pressure on your abdomen.  Raise (elevate) the head of your bed about 6 inches (15 cm).  Avoid bending over if this makes your symptoms worse.  Keep all follow-up visits as told by your health care provider. This is important. Contact a health care provider  if:  You have: ? New symptoms. ? Unexplained weight loss. ? Difficulty swallowing or it hurts to swallow. ? Wheezing or a persistent cough. ? A hoarse voice.  Your symptoms do not improve with treatment. Get help right away if you:  Have pain in your arms, neck, jaw, teeth, or back.  Feel sweaty, dizzy, or light-headed.  Have chest pain or shortness of breath.  Vomit and your vomit looks like blood or coffee grounds.  Faint.  Have stool that is bloody or black.  Cannot swallow, drink, or eat. Summary  Gastroesophageal reflux happens when acid from the stomach flows up into the esophagus. GERD is a disease in which the reflux happens often, causes frequent or severe symptoms, or causes problems such as damage to the esophagus.  Treatment for this condition may vary depending on how severe your symptoms are. Your health care provider may recommend diet and lifestyle changes, medicine, or surgery.  Contact a health care provider if you have new or worsening symptoms.  Take over-the-counter and prescription medicines only as told by your health care provider. Do not take aspirin, ibuprofen, or other NSAIDs unless your health care provider told you to do so.  Keep all follow-up visits as told by  your health care provider. This is important. This information is not intended to replace advice given to you by your health care provider. Make sure you discuss any questions you have with your health care provider. Document Revised: 12/04/2017 Document Reviewed: 12/04/2017 Elsevier Patient Education  [place].

## 2020-05-17 NOTE — Progress Notes (Signed)
Primary Care Physician: Kathyrn Drown, MD  Primary Gastroenterologist:  Garfield Cornea, MD   Chief Complaint  Patient presents with  . Gastroesophageal Reflux    reports coughing up clear phlem in mornings    HPI: Austin Morgan is a 64 y.o. male here for follow-up.  Last seen back in May 2021.  History of GERD, abnormal LFTs, alcohol abuse.  When I last saw him he had recently completed inpatient rehabilitation for alcohol abuse in March 2021.  He had slipped for a couple weeks prior to my office visit but had gotten back on track.  Complained of reflux for more than 5 years.  He contributed his symptoms to alcohol use.  His LFTs had been abnormal.  Alkaline phosphatase have been climbing with improvement of AST/ALT.  Hepatitis C antibody previously negative.  LFTs rechecked in August, alkaline phosphatase stable at 182, AST and ALT now normal.  Abdominal ultrasound May 2021 showed fatty liver, slight aneurysm dilatation of the distal abdominal aorta, 3.2 cm.  Recommend follow-up ultrasound in 3 years.  Since his last OV, he completed EGD/colonoscopy. He had 9 colon polyps removed, multiple tubular adenomas. Next colonoscopy planned in 01/2023. His EGD was normal. His omeprazole was switched to pantoprazole 77m daily.  He denies any heartburn on pantoprazole. He feels full all the time. He takes a handful of medications at breakfast and finds it hard to eat. No abdominal pain. BM regular. No melena, brbpr. He continues to slip at times and use alcohol. He will binge for few days when this occurs.     Current Outpatient Medications  Medication Sig Dispense Refill  . allopurinol (ZYLOPRIM) 300 MG tablet Take 1 tablet (300 mg total) by mouth 2 (two) times daily. 180 tablet 1  . Ascorbic Acid (VITAMIN C) 500 MG CAPS Take 500 mg by mouth daily.    .Marland Kitchenaspirin 81 MG tablet Take 81 mg by mouth daily.    .Marland Kitchenatorvastatin (LIPITOR) 40 MG tablet TAKE 1 TABLET BY MOUTH  DAILY (Patient taking  differently: Take 40 mg by mouth daily. ) 90 tablet 0  . blood glucose meter kit and supplies KIT Dispense based on patient and insurance preference. Use up to BID as directed. 1 each 0  . Coenzyme Q10 (COQ10) 100 MG CAPS Take 100 mg by mouth daily.    . cyclobenzaprine (FLEXERIL) 10 MG tablet Take 1/2-1 tab po TID prn back pain; may cause drowsiness (Patient taking differently: Take 5-10 mg by mouth 3 (three) times daily as needed for muscle spasms. Take 1/2-1 tab po TID prn back pain; may cause drowsiness) 30 tablet 0  . Dulaglutide (TRULICITY) 03.38MSN/0.5LZSOPN Inject 0.5 mLs (0.75 mg total) into the skin every Monday. INJECT 1 SYRINGE SUBCUTANEOUSLY ONCE A WEEK (ON THE SAME DAY OF EACH WEEK) AS DIRECTED. 0.5 mL 5  . fish oil-omega-3 fatty acids 1000 MG capsule Take 1 g by mouth 2 (two) times daily.     .Marland KitchenglipiZIDE (GLUCOTROL) 5 MG tablet Take 1 tablet (5 mg total) by mouth 2 (two) times daily. 90 tablet 1  . hydrochlorothiazide (HYDRODIURIL) 25 MG tablet Take 1 tablet (25 mg total) by mouth daily. 90 tablet 1  . hydrOXYzine (VISTARIL) 50 MG capsule Take 50 mg by mouth every 6 (six) hours as needed.    .Marland Kitchenlisinopril (ZESTRIL) 5 MG tablet TAKE (1) TABLET BY MOUTH ONCE DAILY. 90 tablet 1  . metFORMIN (GLUCOPHAGE) 500 MG tablet TAKE (1)  TABLET BY MOUTH TWICE DAILY. 180 tablet 1  . Multiple Vitamins-Minerals (MULTIVITAMIN WITH MINERALS) tablet Take 1 tablet by mouth daily.    . nabumetone (RELAFEN) 500 MG tablet Take 1 tablet (500 mg total) by mouth 2 (two) times daily as needed. For back pain 30 tablet 0  . naltrexone (DEPADE) 50 MG tablet TAKE ONE TABLET BY MOUTH ONCE DAILY. 90 tablet 1  . naproxen sodium (ANAPROX) 220 MG tablet Take 220 mg by mouth daily as needed (pain).     Glory Rosebush ULTRA test strip USE AS DIRECTED TO TEST BLOOD GLUCOSE TWICE DAILY. 50 each 1  . pantoprazole (PROTONIX) 40 MG tablet Take 40 mg by mouth daily.    . pramipexole (MIRAPEX) 0.125 MG tablet Take one tab po qhs at  least 2 hours before bedtime for restless legs 30 tablet 5  . tadalafil (CIALIS) 20 MG tablet Take one tab every other day as needed for erectile dysfunction 10 tablet 5  . traZODone (DESYREL) 100 MG tablet Take 1 tablet (100 mg total) by mouth at bedtime as needed. for sleep 30 tablet 5   No current facility-administered medications for this visit.    Allergies as of 05/17/2020 - Review Complete 05/17/2020  Allergen Reaction Noted  . Codeine  11/26/2008    ROS:  General: Negative for anorexia, weight loss, fever, chills, fatigue, weakness. ENT: Negative for hoarseness, difficulty swallowing , nasal congestion. CV: Negative for chest pain, angina, palpitations, dyspnea on exertion, peripheral edema.  Respiratory: Negative for dyspnea at rest, dyspnea on exertion, cough, sputum, wheezing.  GI: See history of present illness. GU:  Negative for dysuria, hematuria, urinary incontinence, urinary frequency, nocturnal urination.  Endo: Negative for unusual weight change.    Physical Examination:   BP 114/77   Pulse 90   Temp (!) 96.8 F (36 C)   Ht 5' 7"  (1.702 m)   Wt 187 lb 9.6 oz (85.1 kg)   BMI 29.38 kg/m   General: Well-nourished, well-developed in no acute distress.  Eyes: No icterus. Mouth: masked Lungs: Clear to auscultation bilaterally.  Heart: Regular rate and rhythm, no murmurs rubs or gallops.  Abdomen: Bowel sounds are normal, nontender, nondistended, no  splenomegaly or masses, no abdominal bruits or hernia , no rebound or guarding.  Liver edge easily palpated two fingerbreadth below the RCM in MCL and left hepatic lobe easily palpated in epigastric region.  Extremities: No lower extremity edema. No clubbing or deformities. Neuro: Alert and oriented x 4   Skin: Warm and dry, no jaundice.   Psych: Alert and cooperative, normal mood and affect.  Labs:  Lab Results  Component Value Date   CREATININE 1.20 01/14/2020   BUN 18 01/14/2020   NA 140 01/14/2020   K 4.3  01/14/2020   CL 99 01/14/2020   CO2 26 01/14/2020   Lab Results  Component Value Date   WBC 7.5 01/14/2020   HGB 11.7 (L) 01/14/2020   HCT 34.0 (L) 01/14/2020   MCV 96 01/14/2020   PLT 321 01/14/2020   Lab Results  Component Value Date   ALT 17 01/14/2020   AST 19 01/14/2020   ALKPHOS 182 (H) 01/14/2020   BILITOT 0.3 01/14/2020     Imaging Studies: No results found.   Impression:  64 y/o male with history of GERD, abnormal LFTS in setting of prior etoh abuse/fatty liver.  GERD: typical heartburn well controlled. He complains of coughing up clear phlegm in the mornings and he and his wife  wonder if this is reflux related. He quit smoking years ago. Back then he would cough up colored phlegm but this seems different. Will try taking his pantoprazole prior to evening meal to see if this helps. If he has breakthrough daytime heartburn then we may have to go to BID dosing.   Abnormal LFTS: his AST/ALT are now normal but his AlkPhos remains elevated. Will check GGT, AMA, LFTs at this time. He has easily palpable liver on exam which may be contributing to his early satiety. Hopefully this will improve if he stays away from etoh. Encouraged complete cessation as binging is detrimental to his liver health.   Normocytic anemia: check CBC now. EGD/colonoscopy up to date.

## 2020-06-02 LAB — PSA: Prostate Specific Ag, Serum: 0.8 ng/mL (ref 0.0–4.0)

## 2020-06-02 LAB — URIC ACID: Uric Acid: 3.6 mg/dL — ABNORMAL LOW (ref 3.8–8.4)

## 2020-06-03 LAB — HEPATIC FUNCTION PANEL
ALT: 97 IU/L — ABNORMAL HIGH (ref 0–44)
AST: 105 IU/L — ABNORMAL HIGH (ref 0–40)
Albumin: 4.2 g/dL (ref 3.8–4.8)
Alkaline Phosphatase: 247 IU/L — ABNORMAL HIGH (ref 44–121)
Bilirubin Total: 0.5 mg/dL (ref 0.0–1.2)
Bilirubin, Direct: 0.28 mg/dL (ref 0.00–0.40)
Total Protein: 7.1 g/dL (ref 6.0–8.5)

## 2020-06-03 LAB — MITOCHONDRIAL ANTIBODIES: Mitochondrial Ab: <20 Units (ref 0.0–20.0)

## 2020-06-03 LAB — GAMMA GT: GGT: 570 IU/L — ABNORMAL HIGH (ref 0–65)

## 2020-06-09 ENCOUNTER — Other Ambulatory Visit: Payer: Self-pay

## 2020-06-24 ENCOUNTER — Telehealth: Payer: Self-pay

## 2020-06-24 ENCOUNTER — Other Ambulatory Visit: Payer: Self-pay | Admitting: *Deleted

## 2020-06-24 MED ORDER — TRULICITY 0.75 MG/0.5ML ~~LOC~~ SOAJ
0.7500 mg | SUBCUTANEOUS | 0 refills | Status: DC
Start: 2020-06-27 — End: 2021-04-19

## 2020-06-24 NOTE — Telephone Encounter (Signed)
Rx sent, patient to notify us with any issues.

## 2020-06-24 NOTE — Telephone Encounter (Signed)
Patient is getting ready to loose insurance temporarily and would like a 90 day supply of Dulaglutide (TRULICITY) 4.17 LW/7.8NZ SOPN to get him through until new insurance kicks in.    [place]

## 2020-06-28 ENCOUNTER — Other Ambulatory Visit: Payer: Self-pay | Admitting: Family Medicine

## 2020-06-28 ENCOUNTER — Other Ambulatory Visit: Payer: Self-pay | Admitting: Internal Medicine

## 2020-06-28 ENCOUNTER — Encounter: Payer: Self-pay | Admitting: Family Medicine

## 2020-06-28 NOTE — Telephone Encounter (Signed)
May have corrected prescription for glipizide 5 mg take 1 tablet twice daily as directed at mealtimes #60 with 5 refills Patient should do a lab work and office visit later in the spring

## 2020-06-28 NOTE — Telephone Encounter (Signed)
Please see my chart message Sent in the corrected prescription as stated 5 mg tablet 1 taken twice daily, #60, 5 refills Follow-up office visits springtime with lab work

## 2020-06-29 MED ORDER — GLIPIZIDE 5 MG PO TABS: ORAL_TABLET | ORAL | 5 refills | Status: DC

## 2020-06-29 NOTE — Addendum Note (Signed)
Addended by: Vicente Males on: 06/29/2020 08:33 AM   Modules accepted: Orders

## 2020-07-30 ENCOUNTER — Encounter: Payer: Self-pay | Admitting: Family Medicine

## 2020-07-30 ENCOUNTER — Other Ambulatory Visit: Payer: Self-pay | Admitting: Family Medicine

## 2020-07-30 DIAGNOSIS — E785 Hyperlipidemia, unspecified: Secondary | ICD-10-CM

## 2020-07-30 DIAGNOSIS — E119 Type 2 diabetes mellitus without complications: Secondary | ICD-10-CM

## 2020-07-30 DIAGNOSIS — I10 Essential (primary) hypertension: Secondary | ICD-10-CM

## 2020-07-30 DIAGNOSIS — Z79899 Other long term (current) drug therapy: Secondary | ICD-10-CM

## 2020-08-01 ENCOUNTER — Other Ambulatory Visit: Payer: Self-pay | Admitting: *Deleted

## 2020-08-01 MED ORDER — METFORMIN HCL 500 MG PO TABS: ORAL_TABLET | ORAL | 2 refills | Status: DC

## 2020-08-01 MED ORDER — ALLOPURINOL 300 MG PO TABS: ORAL_TABLET | ORAL | 2 refills | Status: DC

## 2020-08-01 NOTE — Telephone Encounter (Signed)
Nurses Please go ahead and give him enough refills to last through the end of May Austin Morgan can do lab work in May with a follow-up office visit.  If we can be of further help let us know thanks-Dr. Nicki Reaper

## 2020-08-01 NOTE — Telephone Encounter (Signed)
May have this with 2 refill needs follow-up office visit in the spring with A1c, lipid, met 7

## 2020-08-19 IMAGING — US US ABDOMEN COMPLETE
1 series · 13 of 25 positions shown · non-contrast
Comparison: None.

CLINICAL DATA: Elevated LFTs

EXAM:
ABDOMEN ULTRASOUND COMPLETE

[Series 1: us abdomen complete · 13 of 90 slices shown]
[im 1/90]
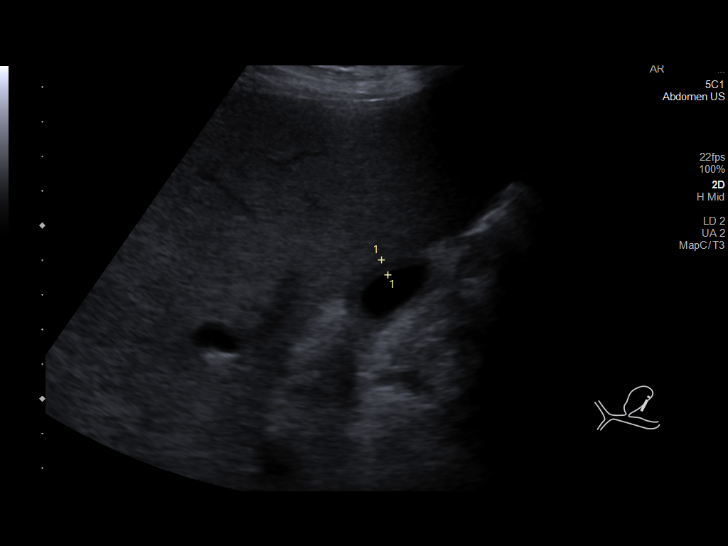
[im 8/90]
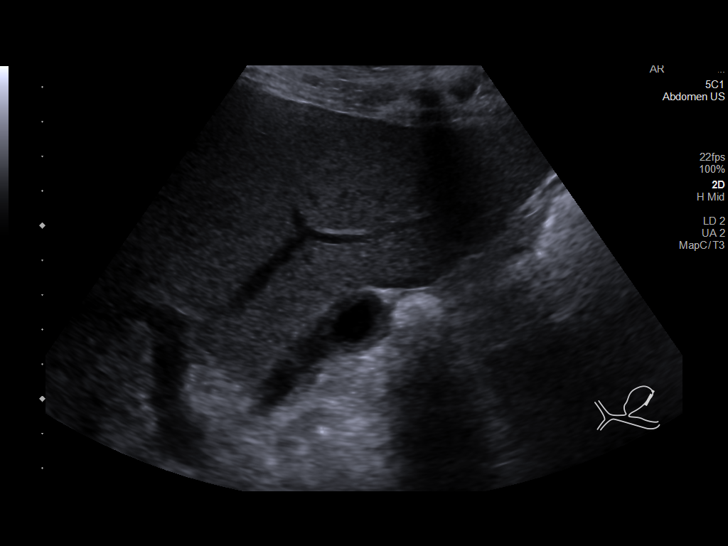
[im 15/90]
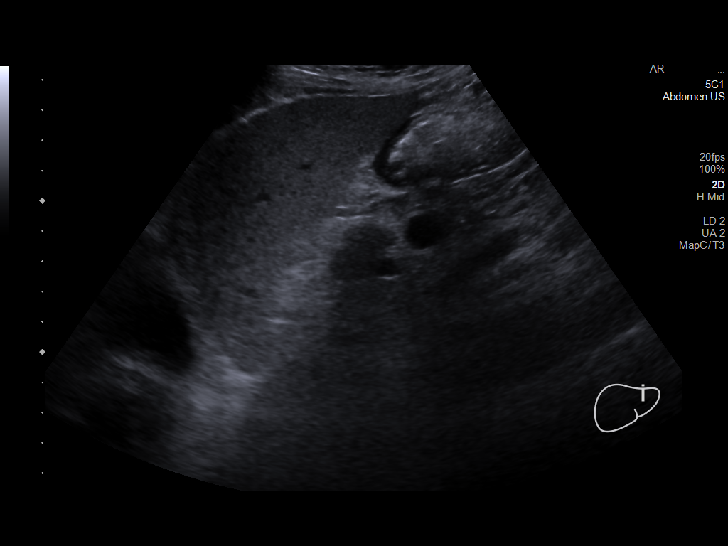
[im 23/90]
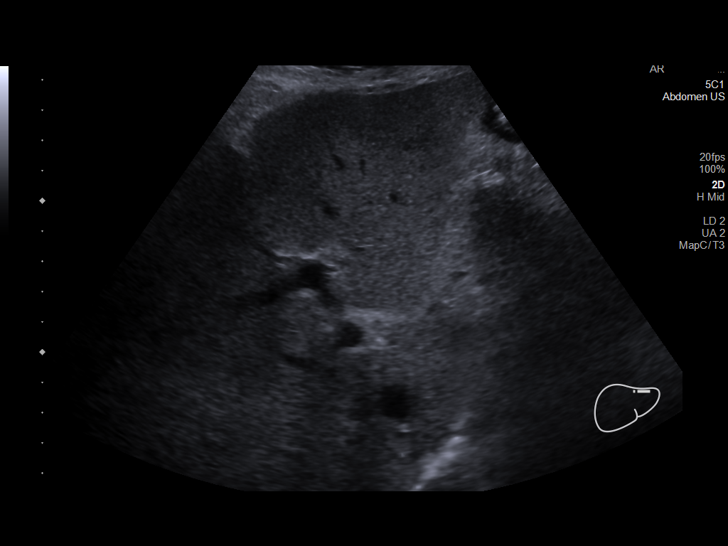
[im 30/90]
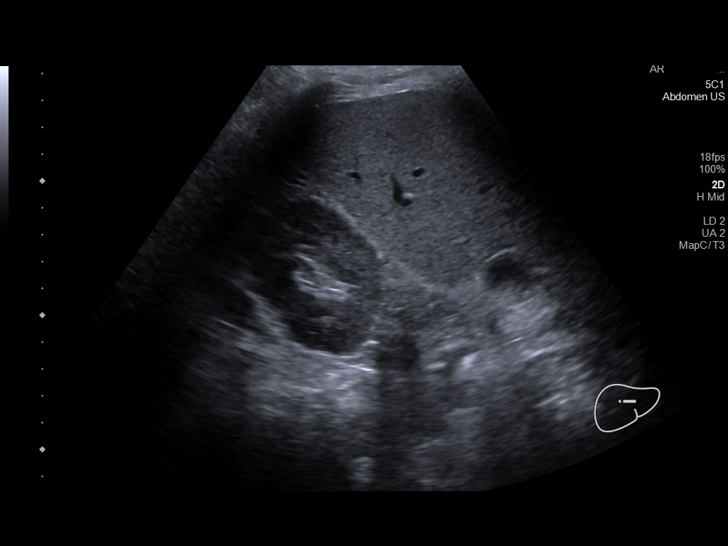
[im 38/90]
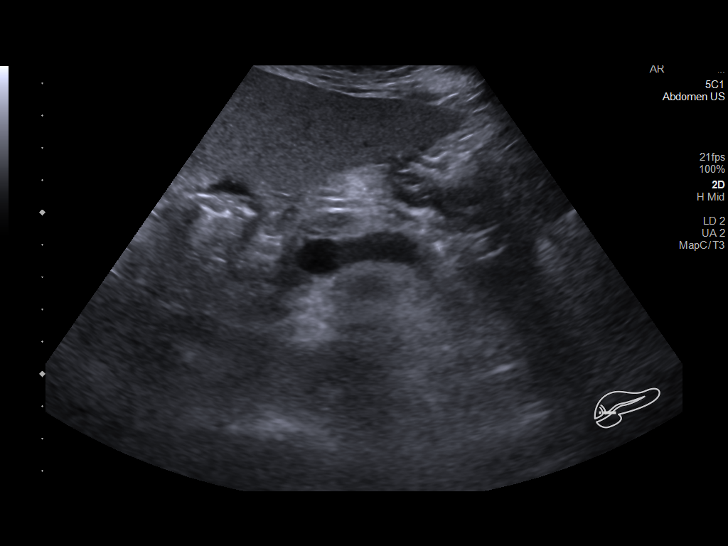
[im 45/90]
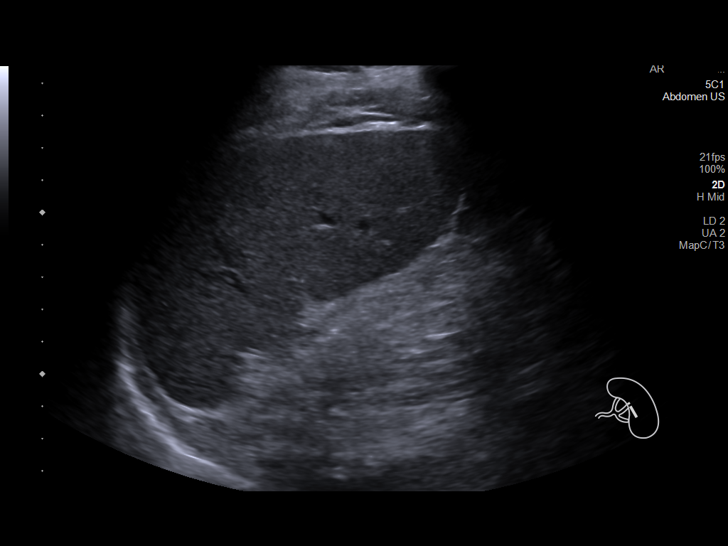
[im 52/90]
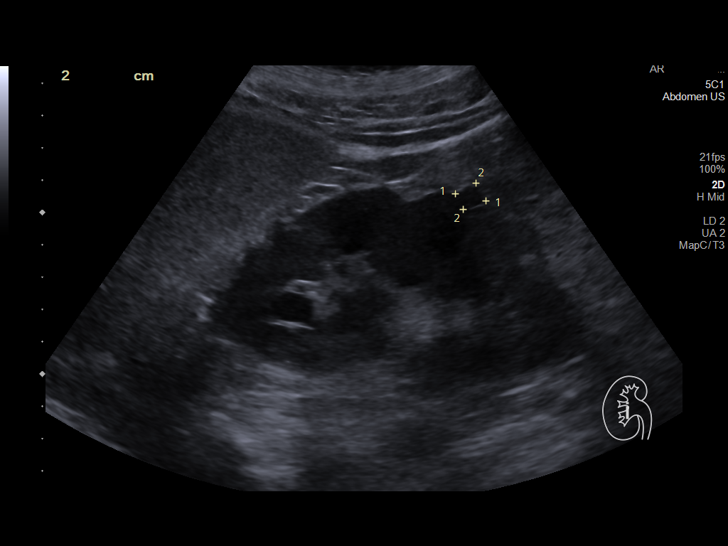
[im 60/90]
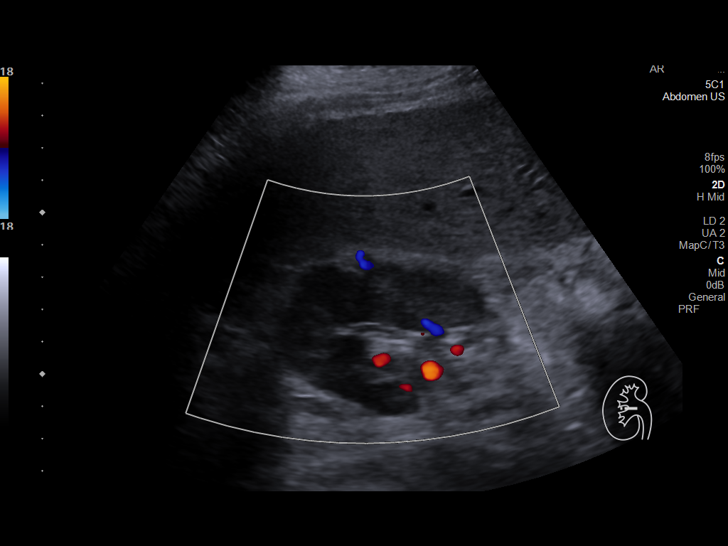
[im 67/90]
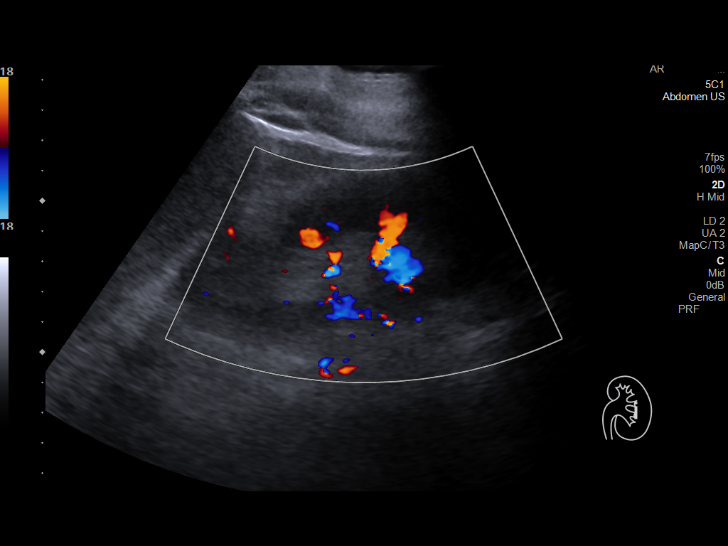
[im 75/90]
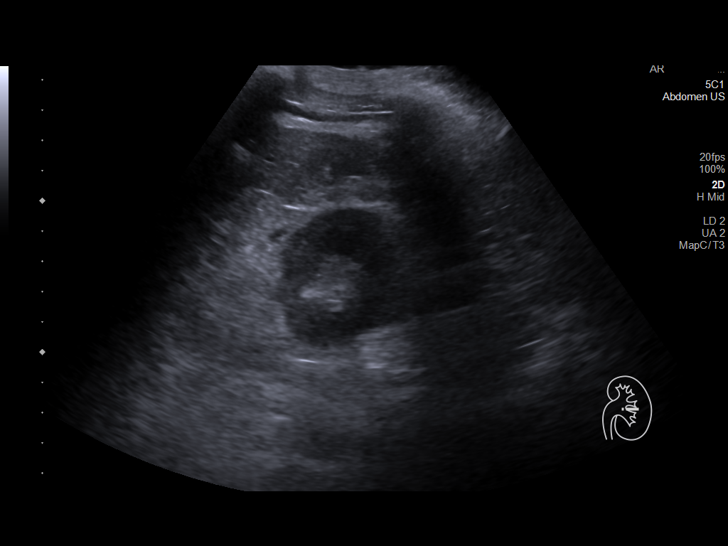
[im 82/90]
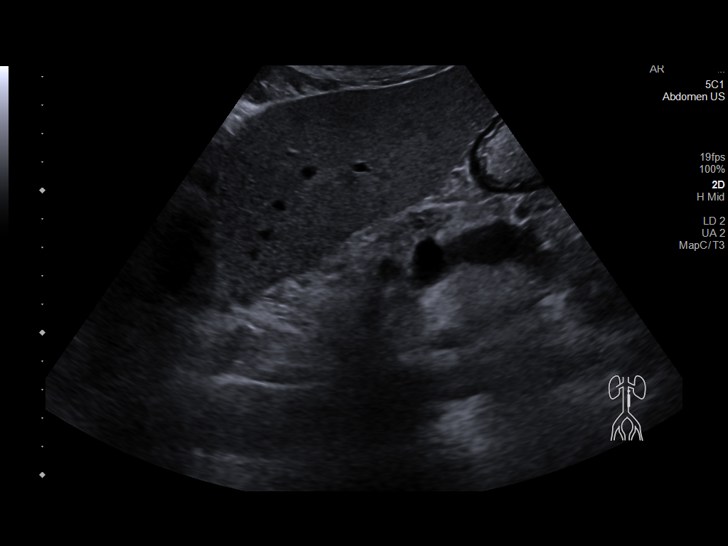
[im 90/90]
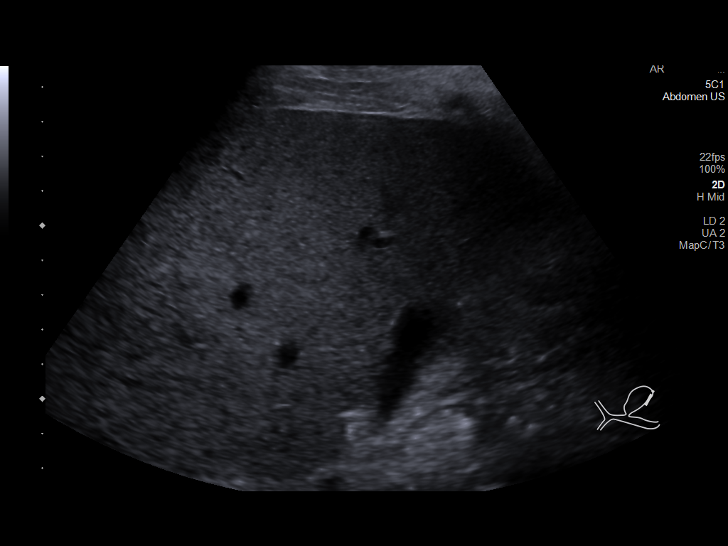

[13 of 25 positions shown; findings below may reference images not displayed]

FINDINGS: Gallbladder: No gallstones or wall thickening visualized. No
sonographic Murphy sign noted by sonographer.

Common bile duct: Diameter: Normal caliber, 3 mm

Liver: Increased echotexture compatible with fatty infiltration. No
focal abnormality or biliary ductal dilatation. Portal vein is
patent on color Doppler imaging with normal direction of blood flow
towards the liver.

IVC: No abnormality visualized.

Pancreas: Visualized portion unremarkable.

Spleen: Size and appearance within normal limits.

Right Kidney: Length: 11.6 cm. Normal echotexture. Small cyst in the
midpole measures 11 mm. No hydronephrosis.

Left Kidney: Length: 11.2 cm. Echogenicity within normal limits. No
mass or hydronephrosis visualized.

Abdominal aorta: 3.2 cm maximally in the distal abdominal aorta.

Other findings: None.
IMPRESSION: No acute findings.

Slight aneurysmal dilatation of the distal abdominal aorta, 3.2 cm.
Recommend followup by ultrasound in 3 years. This recommendation
follows ACR consensus guidelines: White Paper of the ACR Incidental
[DATE]. Aortic aneurysm NOS ([id number].3)

## 2020-08-23 ENCOUNTER — Other Ambulatory Visit: Payer: Self-pay | Admitting: *Deleted

## 2020-08-23 NOTE — Telephone Encounter (Signed)
Pt also asked for refills on trazodone. And please clarify dose of zofran. He is asking for 20mg  of zofran. His history in chart is 8mg .

## 2020-08-23 NOTE — Telephone Encounter (Signed)
Nurses Please send in 3 months on his Zoloft  As for the Zofran he may have 2 refills if he does not have an existing prescription and can be for 15 tablets 1 every 8 hours as needed  If further needs let us know thanks-Dr. Nicki Reaper

## 2020-08-24 NOTE — Telephone Encounter (Signed)
Nurses I missed read the message  He may have 6 months on his trazodone Patient is not on sertraline Also as for Zofran 8 mg is the maximum It does not come in a 20 mg strength 1 tablet taken 3 times daily as needed for nausea, #20 with 4 refills

## 2020-08-25 MED ORDER — ONDANSETRON HCL 8 MG PO TABS: ORAL_TABLET | ORAL | 4 refills | Status: DC

## 2020-08-25 MED ORDER — TRAZODONE HCL 100 MG PO TABS: 100.0000 mg | ORAL_TABLET | Freq: Every evening | ORAL | 1 refills | Status: DC | PRN

## 2020-08-25 NOTE — Addendum Note (Signed)
Addended by: Vicente Males on: 08/25/2020 09:13 AM   Modules accepted: Orders

## 2020-09-01 ENCOUNTER — Encounter: Payer: Self-pay | Admitting: Family Medicine

## 2020-09-02 ENCOUNTER — Encounter: Payer: Self-pay | Admitting: Family Medicine

## 2020-09-02 MED ORDER — GLIPIZIDE 5 MG PO TABS: ORAL_TABLET | ORAL | 1 refills | Status: DC

## 2020-09-02 MED ORDER — LISINOPRIL 5 MG PO TABS
ORAL_TABLET | ORAL | 0 refills | Status: DC
Start: 2020-09-02 — End: 2020-12-28

## 2020-09-02 NOTE — Telephone Encounter (Signed)
Nurses I corrected the his medication list and sent in the proper dosing of the glipizide twice daily as well as the lisinopril.  Please forward message to the patient that this is been completed and was sent to [facility] in [place] we will see him later this spring.  TakeCare-Dr. Nicki Reaper

## 2020-09-23 ENCOUNTER — Other Ambulatory Visit: Payer: Self-pay | Admitting: Family Medicine

## 2020-10-02 ENCOUNTER — Encounter: Payer: Self-pay | Admitting: Family Medicine

## 2020-10-03 MED ORDER — METFORMIN HCL 500 MG PO TABS: ORAL_TABLET | ORAL | 0 refills | Status: DC

## 2020-10-03 MED ORDER — ATORVASTATIN CALCIUM 40 MG PO TABS: 1.0000 | ORAL_TABLET | Freq: Every day | ORAL | 0 refills | Status: DC

## 2020-11-08 ENCOUNTER — Encounter: Payer: Self-pay | Admitting: Family Medicine

## 2020-11-08 MED ORDER — HYDROCHLOROTHIAZIDE 25 MG PO TABS: 25.0000 mg | ORAL_TABLET | Freq: Every day | ORAL | 1 refills | Status: DC

## 2020-11-08 MED ORDER — ATORVASTATIN CALCIUM 40 MG PO TABS: 1.0000 | ORAL_TABLET | Freq: Every day | ORAL | 1 refills | Status: DC

## 2020-11-08 MED ORDER — ALLOPURINOL 300 MG PO TABS: ORAL_TABLET | ORAL | 1 refills | Status: DC

## 2020-11-08 MED ORDER — METFORMIN HCL 500 MG PO TABS: ORAL_TABLET | ORAL | 1 refills | Status: DC

## 2020-11-08 NOTE — Telephone Encounter (Signed)
Nurses-please send in 30-day with 1 refill each Then send patient notification Thanks-Dr. Nicki Reaper

## 2020-11-08 NOTE — Addendum Note (Signed)
Addended by: Vicente Males on: 11/08/2020 04:41 PM   Modules accepted: Orders

## 2020-11-08 NOTE — Addendum Note (Signed)
Addended by: Vicente Males on: 11/08/2020 04:24 PM   Modules accepted: Orders

## 2020-11-28 ENCOUNTER — Encounter: Payer: Self-pay | Admitting: Internal Medicine

## 2020-12-05 ENCOUNTER — Other Ambulatory Visit: Payer: Self-pay

## 2020-12-05 ENCOUNTER — Ambulatory Visit: Payer: Self-pay | Admitting: Family Medicine

## 2020-12-05 DIAGNOSIS — R160 Hepatomegaly, not elsewhere classified: Secondary | ICD-10-CM

## 2020-12-05 DIAGNOSIS — R600 Localized edema: Secondary | ICD-10-CM

## 2020-12-05 MED ORDER — NALTREXONE HCL 50 MG PO TABS: ORAL_TABLET | ORAL | 1 refills | Status: DC

## 2020-12-05 NOTE — Patient Instructions (Signed)
Start off with 50 mg Depade one daily   Then in 7 to 14 days we can increase to 100 mg  Please give me an update in 10 to 14 days  If any setbacks please call

## 2020-12-05 NOTE — Progress Notes (Signed)
   Subjective:    Patient ID: Austin Morgan, male    DOB: 05-22-1956, 65 y.o.   MRN: [id number]  HPI  Abdominal bloating - liver concerns - wife has told to stop allopurinol and atorvastatin And doubled hctz dose Patient unfortunately had relapse with alcohol use Patient states that he is trying to do better now Is requesting medication to help him stay off of alcohol He does not feel he is having any type of withdrawals He does relate a little bit of swelling in the lower legs  Review of Systems     Objective:   Physical Exam  General-in no acute distress Eyes-no discharge Lungs-respiratory rate normal, CTA CV-no murmurs,RRR Extremities skin warm dry no edema Neuro grossly normal Behavior normal, alert  Abdomen is soft protrudes some slight enlargement of the liver no ascites appreciated on physical exam review abdomen falling into the pool you should plug your phone appointment      Assessment & Plan:  1. Pedal edema Patient currently using HCTZ taking to each morning we might be switching this diuretic up await his lab work first - US Abdomen Limited RUQ (LIVER/GB)  2. Hepatomegaly Along with swollen abdomen we will do ultrasound to look at liver size also elastrography - US Abdomen Limited RUQ (LIVER/GB)  Alcohol abuse with overuse patient states he has been off the alcohol for 3 days and denies any withdrawals states in the past he has never had withdrawals I educated him regarding the warning symptoms of withdrawals and to seek immediate help should they start happening  For now hold off on atorvastatin and allopurinol  Recheck in 1 month  Naltrexone 50 mg daily Depade after 1 to 2 weeks if he is tolerating medicine well we can go up to 100 mg  Referral back to gastroenterology as well

## 2020-12-06 LAB — CBC WITH DIFFERENTIAL/PLATELET
Basophils Absolute: 0.1 10*3/uL (ref 0.0–0.2)
Basos: 1 %
EOS (ABSOLUTE): 0.2 10*3/uL (ref 0.0–0.4)
Eos: 3 %
Hematocrit: 34.9 % — ABNORMAL LOW (ref 37.5–51.0)
Hemoglobin: 11.7 g/dL — ABNORMAL LOW (ref 13.0–17.7)
Immature Grans (Abs): 0 10*3/uL (ref 0.0–0.1)
Immature Granulocytes: 0 %
Lymphocytes Absolute: 1.2 10*3/uL (ref 0.7–3.1)
Lymphs: 18 %
MCH: 33.1 pg — ABNORMAL HIGH (ref 26.6–33.0)
MCHC: 33.5 g/dL (ref 31.5–35.7)
MCV: 99 fL — ABNORMAL HIGH (ref 79–97)
Monocytes Absolute: 0.6 10*3/uL (ref 0.1–0.9)
Monocytes: 9 %
Neutrophils Absolute: 4.9 10*3/uL (ref 1.4–7.0)
Neutrophils: 69 %
Platelets: 104 10*3/uL — ABNORMAL LOW (ref 150–450)
RBC: 3.54 x10E6/uL — ABNORMAL LOW (ref 4.14–5.80)
RDW: 13.8 % (ref 11.6–15.4)
WBC: 7 10*3/uL (ref 3.4–10.8)

## 2020-12-07 ENCOUNTER — Ambulatory Visit (HOSPITAL_COMMUNITY)
Admission: RE | Admit: 2020-12-07 | Discharge: 2020-12-07 | Disposition: A | Discharge status: Home / Self Care | Payer: 59 | Source: Ambulatory Visit | Attending: Family Medicine | Admitting: Family Medicine

## 2020-12-07 ENCOUNTER — Other Ambulatory Visit: Payer: Self-pay

## 2020-12-07 DIAGNOSIS — R160 Hepatomegaly, not elsewhere classified: Secondary | ICD-10-CM | POA: Diagnosis present

## 2020-12-07 DIAGNOSIS — R600 Localized edema: Secondary | ICD-10-CM | POA: Insufficient documentation

## 2020-12-07 LAB — BASIC METABOLIC PANEL
BUN/Creatinine Ratio: 12 (ref 10–24)
BUN: 17 mg/dL (ref 8–27)
CO2: 22 mmol/L (ref 20–29)
Calcium: 8.6 mg/dL (ref 8.6–10.2)
Chloride: 96 mmol/L (ref 96–106)
Creatinine, Ser: 1.42 mg/dL — ABNORMAL HIGH (ref 0.76–1.27)
Glucose: 148 mg/dL — ABNORMAL HIGH (ref 65–99)
Potassium: 3.7 mmol/L (ref 3.5–5.2)
Sodium: 137 mmol/L (ref 134–144)
eGFR: 55 mL/min/{1.73_m2} — ABNORMAL LOW (ref >59)

## 2020-12-07 LAB — LIPID PANEL
Chol/HDL Ratio: 3.3 ratio (ref 0.0–5.0)
Cholesterol, Total: 88 mg/dL — ABNORMAL LOW (ref 100–199)
HDL: 27 mg/dL — ABNORMAL LOW (ref >39)
LDL Chol Calc (NIH): 37 mg/dL (ref 0–99)
Triglycerides: 137 mg/dL (ref 0–149)
VLDL Cholesterol Cal: 24 mg/dL (ref 5–40)

## 2020-12-07 LAB — HEMOGLOBIN A1C
Est. average glucose Bld gHb Est-mCnc: 177 mg/dL
Hgb A1c MFr Bld: 7.8 % — ABNORMAL HIGH (ref 4.8–5.6)

## 2020-12-08 ENCOUNTER — Telehealth: Payer: Self-pay | Admitting: Family Medicine

## 2020-12-13 ENCOUNTER — Other Ambulatory Visit: Payer: Self-pay | Admitting: *Deleted

## 2020-12-14 ENCOUNTER — Other Ambulatory Visit: Payer: Self-pay | Admitting: *Deleted

## 2020-12-14 DIAGNOSIS — K7460 Unspecified cirrhosis of liver: Secondary | ICD-10-CM

## 2020-12-14 NOTE — Progress Notes (Signed)
Referral put in.

## 2020-12-19 ENCOUNTER — Encounter: Payer: Self-pay | Admitting: Internal Medicine

## 2020-12-20 ENCOUNTER — Other Ambulatory Visit: Payer: Self-pay | Admitting: *Deleted

## 2020-12-20 DIAGNOSIS — D649 Anemia, unspecified: Secondary | ICD-10-CM

## 2020-12-20 DIAGNOSIS — R748 Abnormal levels of other serum enzymes: Secondary | ICD-10-CM

## 2020-12-28 ENCOUNTER — Other Ambulatory Visit: Payer: Self-pay | Admitting: Family Medicine

## 2021-01-02 ENCOUNTER — Ambulatory Visit: Payer: 59 | Admitting: Family Medicine

## 2021-01-02 ENCOUNTER — Encounter: Payer: Self-pay | Admitting: Family Medicine

## 2021-01-02 ENCOUNTER — Other Ambulatory Visit: Payer: Self-pay

## 2021-01-02 VITALS — BP 124/82 | Temp 97.4°F | Wt 177.4 lb

## 2021-01-02 DIAGNOSIS — I714 Abdominal aortic aneurysm, without rupture, unspecified: Secondary | ICD-10-CM

## 2021-01-02 DIAGNOSIS — F1010 Alcohol abuse, uncomplicated: Secondary | ICD-10-CM | POA: Diagnosis not present

## 2021-01-02 DIAGNOSIS — I10 Essential (primary) hypertension: Secondary | ICD-10-CM | POA: Diagnosis not present

## 2021-01-02 DIAGNOSIS — R748 Abnormal levels of other serum enzymes: Secondary | ICD-10-CM | POA: Diagnosis not present

## 2021-01-02 DIAGNOSIS — E119 Type 2 diabetes mellitus without complications: Secondary | ICD-10-CM

## 2021-01-02 DIAGNOSIS — F1021 Alcohol dependence, in remission: Secondary | ICD-10-CM

## 2021-01-02 MED ORDER — TRAZODONE HCL 100 MG PO TABS: 100.0000 mg | ORAL_TABLET | Freq: Every evening | ORAL | 1 refills | Status: DC | PRN

## 2021-01-02 MED ORDER — LISINOPRIL 5 MG PO TABS: ORAL_TABLET | ORAL | 1 refills | Status: DC

## 2021-01-02 MED ORDER — METFORMIN HCL 500 MG PO TABS: ORAL_TABLET | ORAL | 6 refills | Status: DC

## 2021-01-02 MED ORDER — HYDROCHLOROTHIAZIDE 25 MG PO TABS: 25.0000 mg | ORAL_TABLET | Freq: Every day | ORAL | 6 refills | Status: DC

## 2021-01-02 MED ORDER — NALTREXONE HCL 50 MG PO TABS: ORAL_TABLET | ORAL | 1 refills | Status: DC

## 2021-01-02 NOTE — Progress Notes (Signed)
   Subjective:    Patient ID: Austin Morgan, male    DOB: 1955/09/03, 65 y.o.   MRN: [id number]  HPI Pt here for follow up on pedal edema and swelling in liver. Pt states he his doing good.  History of alcoholism Doing a great job staying away Taking naltrexone currently Denies any drinking in the past month Swelling is gone down in his legs Abdomen is doing better Has lab work ordered by gastroenterology for which she will do in the near future  Review of Systems     Objective:   Physical Exam  General-in no acute distress Eyes-no discharge Lungs-respiratory rate normal, CTA CV-no murmurs,RRR Extremities skin warm dry no edema Neuro grossly normal Behavior normal, alert       Assessment & Plan:  1. Primary hypertension BP good control continue current measures  2. Diabetes mellitus without complication ([place]) F8B decent control continue current measures check glucoses periodically send [place] readings  3. Alcohol abuse Staying away from alcohol using naltrexone  4. Elevated liver enzymes Needs to do comprehensive lab work that the GI ordered await the results of this follow-up here in approximately 4 months  Small aneurysm noted on ultrasound we will relook at this again in 1 years time  Doing a great job staying away from alcohol

## 2021-01-03 NOTE — Progress Notes (Signed)
Message placed in reminder file  

## 2021-01-10 ENCOUNTER — Encounter: Payer: Self-pay | Admitting: Family Medicine

## 2021-01-12 MED ORDER — GLIPIZIDE 5 MG PO TABS: ORAL_TABLET | ORAL | 1 refills | Status: DC

## 2021-01-12 NOTE — Telephone Encounter (Signed)
Nurses Please refill glipizide 2 tablets twice daily of the 5 mg Hold off on Trulicity currently Please notify family thank you

## 2021-01-12 NOTE — Addendum Note (Signed)
Addended by: Vicente Males on: 01/12/2021 10:34 AM   Modules accepted: Orders

## 2021-01-12 NOTE — Telephone Encounter (Signed)
Nurses  His glucose is running high.  I would not recommend restarting Trulicity.  There are other medications that could be beneficial including Jardiance and [place] These medications work on receptor sites in the kidneys There are some specific aspects to these medications that would need to be covered in detail  I would not feel comfortable just sending in these medicines based on the message instead I would like to do a consultation with Caydon he is more than welcome to invite his wife as well.  If they would like to do this as a virtual visit or in person visit that would be fine.  I am out today and tomorrow but he may be placed on the schedule next week thank you

## 2021-03-23 ENCOUNTER — Other Ambulatory Visit: Payer: Self-pay | Admitting: Family Medicine

## 2021-04-17 DIAGNOSIS — R748 Abnormal levels of other serum enzymes: Secondary | ICD-10-CM | POA: Diagnosis not present

## 2021-04-17 DIAGNOSIS — D649 Anemia, unspecified: Secondary | ICD-10-CM | POA: Diagnosis not present

## 2021-04-18 LAB — COMPREHENSIVE METABOLIC PANEL
ALT: 25 IU/L (ref 0–44)
AST: 40 IU/L (ref 0–40)
Albumin/Globulin Ratio: 1.4 (ref 1.2–2.2)
Albumin: 3.8 g/dL (ref 3.8–4.8)
Alkaline Phosphatase: 154 IU/L — ABNORMAL HIGH (ref 44–121)
BUN/Creatinine Ratio: 17 (ref 10–24)
BUN: 25 mg/dL (ref 8–27)
Bilirubin Total: 1 mg/dL (ref 0.0–1.2)
CO2: 24 mmol/L (ref 20–29)
Calcium: 9.8 mg/dL (ref 8.6–10.2)
Chloride: 98 mmol/L (ref 96–106)
Creatinine, Ser: 1.46 mg/dL — ABNORMAL HIGH (ref 0.76–1.27)
Globulin, Total: 2.8 g/dL (ref 1.5–4.5)
Glucose: 195 mg/dL — ABNORMAL HIGH (ref 70–99)
Potassium: 4.2 mmol/L (ref 3.5–5.2)
Sodium: 136 mmol/L (ref 134–144)
Total Protein: 6.6 g/dL (ref 6.0–8.5)
eGFR: 53 mL/min/{1.73_m2} — ABNORMAL LOW (ref >59)

## 2021-04-18 LAB — HEPATITIS B SURFACE ANTIGEN: Hepatitis B Surface Ag: NEGATIVE

## 2021-04-18 LAB — HEPATITIS A ANTIBODY, TOTAL: hep A Total Ab: POSITIVE — AB

## 2021-04-18 LAB — AFP TUMOR MARKER: AFP, Serum, Tumor Marker: 1.4 ng/mL (ref 0.0–8.4)

## 2021-04-18 LAB — HEPATITIS B SURFACE ANTIBODY,QUALITATIVE: Hep B Surface Ab, Qual: REACTIVE

## 2021-04-18 LAB — PROTIME-INR
INR: 1.2 (ref 0.9–1.2)
Prothrombin Time: 12.2 s — ABNORMAL HIGH (ref 9.1–12.0)

## 2021-04-19 ENCOUNTER — Telehealth: Payer: Self-pay | Admitting: Gastroenterology

## 2021-04-19 ENCOUNTER — Other Ambulatory Visit: Payer: Self-pay

## 2021-04-19 ENCOUNTER — Encounter: Payer: Self-pay | Admitting: Gastroenterology

## 2021-04-19 ENCOUNTER — Ambulatory Visit: Payer: BC Managed Care – PPO | Admitting: Gastroenterology

## 2021-04-19 VITALS — BP 112/72 | HR 76 | Temp 97.3°F | Ht 67.0 in | Wt 172.0 lb

## 2021-04-19 DIAGNOSIS — R748 Abnormal levels of other serum enzymes: Secondary | ICD-10-CM | POA: Diagnosis not present

## 2021-04-19 DIAGNOSIS — K7460 Unspecified cirrhosis of liver: Secondary | ICD-10-CM | POA: Insufficient documentation

## 2021-04-19 DIAGNOSIS — F1021 Alcohol dependence, in remission: Secondary | ICD-10-CM

## 2021-04-19 DIAGNOSIS — K219 Gastro-esophageal reflux disease without esophagitis: Secondary | ICD-10-CM | POA: Diagnosis not present

## 2021-04-19 DIAGNOSIS — K7030 Alcoholic cirrhosis of liver without ascites: Secondary | ICD-10-CM | POA: Diagnosis not present

## 2021-04-19 DIAGNOSIS — D649 Anemia, unspecified: Secondary | ICD-10-CM

## 2021-04-19 NOTE — Telephone Encounter (Signed)
Lab was added on, will fax over authorization form once specimen is pulled and they verify that they have enough blood to use.

## 2021-04-19 NOTE — Patient Instructions (Addendum)
We need to see you twice a year for labs and ultrasound to monitor liver disease. We will make arrangements for next labs and ultrasound in March/April of 2023.  Please continue to work at alcohol avoidance. Consider using your local resources like AA meetings to help with accountability.  Please let me know if you see any yellowing of your eyes or skin, persistent swelling in your legs or abdomen.  Use Miralax one capful daily as needed to help with adequate bowel movements and management of incomplete stools.  Continue pantoprazole 40 mg daily for acid reflux.   It is important to eat a healthy diet, minimize sodium intake to no more than 2 grams of salt a day to help control swelling.  Return office visit in 09/2021.   Two Gram Sodium Diet 2000 mg  What is Sodium? Sodium is a mineral found naturally in many foods. The most significant source of sodium in the diet is table salt, which is about 40% sodium.  Processed, convenience, and preserved foods also contain a large amount of sodium.  The body needs only 500 mg of sodium daily to function,  A normal diet provides more than enough sodium even if you do not use salt.  Why Limit Sodium? A build up of sodium in the body can cause thirst, increased blood pressure, shortness of breath, and water retention.  Decreasing sodium in the diet can reduce edema and risk of heart attack or stroke associated with high blood pressure.  Keep in mind that there are many other factors involved in these health problems.  Heredity, obesity, lack of exercise, cigarette smoking, stress and what you eat all play a role.  General Guidelines: Do not add salt at the table or in cooking.  One teaspoon of salt contains over 2 grams of sodium. Read food labels Avoid processed and convenience foods Ask your dietitian before eating any foods not dicussed in the menu planning guidelines Consult your physician if you wish to use a salt substitute or a sodium containing  medication such as antacids.  Limit milk and milk products to 16 oz (2 cups) per day.  Shopping Hints: READ LABELS!! "Dietetic" does not necessarily mean low sodium. Salt and other sodium ingredients are often added to foods during processing.    Menu Planning Guidelines Food Group Choose More Often Avoid  Beverages (see also the milk group All fruit juices, low-sodium, salt-free vegetables juices, low-sodium carbonated beverages Regular vegetable or tomato juices, commercially softened water used for drinking or cooking  Breads and Cereals Enriched white, wheat, rye and pumpernickel bread, hard rolls and dinner rolls; muffins, cornbread and waffles; most dry cereals, cooked cereal without added salt; unsalted crackers and breadsticks; low sodium or homemade bread crumbs Bread, rolls and crackers with salted tops; quick breads; instant hot cereals; pancakes; commercial bread stuffing; self-rising flower and biscuit mixes; regular bread crumbs or cracker crumbs  Desserts and Sweets Desserts and sweets mad with mild should be within allowance Instant pudding mixes and cake mixes  Fats Butter or margarine; vegetable oils; unsalted salad dressings, regular salad dressings limited to 1 Tbs; light, sour and heavy cream Regular salad dressings containing bacon fat, bacon bits, and salt pork; snack dips made with instant soup mixes or processed cheese; salted nuts  Fruits Most fresh, frozen and canned fruits Fruits processed with salt or sodium-containing ingredient (some dried fruits are processed with sodium sulfites        Vegetables Fresh, frozen vegetables and low- sodium  canned vegetables Regular canned vegetables, sauerkraut, pickled vegetables, and others prepared in brine; frozen vegetables in sauces; vegetables seasoned with ham, bacon or salt pork  Condiments, Sauces, Miscellaneous  Salt substitute with physician's approval; pepper, herbs, spices; vinegar, lemon or lime juice; hot pepper  sauce; garlic powder, onion powder, low sodium soy sauce (1 Tbs.); low sodium condiments (ketchup, chili sauce, mustard) in limited amounts (1 tsp.) fresh ground horseradish; unsalted tortilla chips, pretzels, potato chips, popcorn, salsa (1/4 cup) Any seasoning made with salt including garlic salt, celery salt, onion salt, and seasoned salt; sea salt, rock salt, kosher salt; meat tenderizers; monosodium glutamate; mustard, regular soy sauce, barbecue, sauce, chili sauce, teriyaki sauce, steak sauce, Worcestershire sauce, and most flavored vinegars; canned gravy and mixes; regular condiments; salted snack foods, olives, picles, relish, horseradish sauce, catsup   Food preparation: Try these seasonings Meats:    Pork Sage, onion Serve with applesauce  Chicken Poultry seasoning, thyme, parsley Serve with cranberry sauce  Lamb Curry powder, rosemary, garlic, thyme Serve with mint sauce or jelly  Veal Marjoram, basil Serve with current jelly, cranberry sauce  Beef Pepper, bay leaf Serve with dry mustard, unsalted chive butter  Fish [place] leaf, dill Serve with unsalted lemon butter, unsalted parsley butter  Vegetables:    Asparagus Lemon juice   Broccoli Lemon juice   Carrots Mustard dressing parsley, mint, nutmeg, glazed with unsalted butter and sugar   Green beans Marjoram, lemon juice, nutmeg,dill seed   Tomatoes Basil, marjoram, onion   Spice /blend for [employer]" 4 tsp ground thyme 1 tsp ground sage 3 tsp ground rosemary 4 tsp ground marjoram   Test your knowledge A product that says "Salt Free" may still contain sodium. True or False Garlic Powder and Hot Pepper Sauce an be used as alternative seasonings.True or False Processed foods have more sodium than fresh foods.  True or False Canned Vegetables have less sodium than froze True or False   WAYS TO DECREASE YOUR SODIUM INTAKE Avoid the use of added salt in cooking and at the table.  Table salt (and other prepared seasonings which  contain salt) is probably one of the greatest sources of sodium in the diet.  Unsalted foods can gain flavor from the sweet, sour, and butter taste sensations of herbs and spices.  Instead of using salt for seasoning, try the following seasonings with the foods listed.  Remember: how you use them to enhance natural food flavors is limited only by your creativity... Allspice-Meat, fish, eggs, fruit, peas, red and yellow vegetables Almond Extract-Fruit baked goods Anise Seed-Sweet breads, fruit, carrots, beets, cottage cheese, cookies (tastes like licorice) Basil-Meat, fish, eggs, vegetables, rice, vegetables salads, soups, sauces Bay Leaf-Meat, fish, stews, poultry Burnet-Salad, vegetables (cucumber-like flavor) Caraway Seed-Bread, cookies, cottage cheese, meat, vegetables, cheese, rice Cardamon-Baked goods, fruit, soups Celery Powder or seed-Salads, salad dressings, sauces, meatloaf, soup, bread.Do not use  celery salt Chervil-Meats, salads, fish, eggs, vegetables, cottage cheese (parsley-like flavor) Chili Power-Meatloaf, chicken cheese, corn, eggplant, egg dishes Chives-Salads cottage cheese, egg dishes, soups, vegetables, sauces Cilantro-Salsa, casseroles Cinnamon-Baked goods, fruit, pork, lamb, chicken, carrots Cloves-Fruit, baked goods, fish, pot roast, green beans, beets, carrots Coriander-Pastry, cookies, meat, salads, cheese (lemon-orange flavor) Cumin-Meatloaf, fish,cheese, eggs, cabbage,fruit pie (caraway flavor) [employer], fruit, eggs, fish, poultry, cottage cheese, vegetables Dill Seed-Meat, cottage cheese, poultry, vegetables, fish, salads, bread Fennel Seed-Bread, cookies, apples, pork, eggs, fish, beets, cabbage, cheese, Licorice-like flavor Garlic-(buds or powder) Salads, meat, poultry, fish, bread, butter, vegetables, potatoes.Do not  use  garlic salt Ginger-Fruit, vegetables, baked goods, meat, fish, poultry Horseradish Root-Meet, vegetables, butter Lemon Juice or  Extract-Vegetables, fruit, tea, baked goods, fish salads Mace-Baked goods fruit, vegetables, fish, poultry (taste like nutmeg) Maple Extract-Syrups Marjoram-Meat, chicken, fish, vegetables, breads, green salads (taste like Sage) Mint-Tea, lamb, sherbet, vegetables, desserts, carrots, cabbage Mustard, Dry or Seed-Cheese, eggs, meats, vegetables, poultry Nutmeg-Baked goods, fruit, chicken, eggs, vegetables, desserts Onion Powder-Meat, fish, poultry, vegetables, cheese, eggs, bread, rice salads (Do not use   Onion salt) Orange Extract-Desserts, baked goods Oregano-Pasta, eggs, cheese, onions, pork, lamb, fish, chicken, vegetables, green salads Paprika-Meat, fish, poultry, eggs, cheese, vegetables Parsley Flakes-Butter, vegetables, meat fish, poultry, eggs, bread, salads (certain forms may   Contain sodium Pepper-Meat fish, poultry, vegetables, eggs Peppermint Extract-Desserts, baked goods Poppy Seed-Eggs, bread, cheese, fruit dressings, baked goods, noodles, vegetables, cottage  [employer], poultry, meat, fish, cauliflower, turnips,eggs bread Saffron-Rice, bread, veal, chicken, fish, eggs Sage-Meat, fish, poultry, onions, eggplant, tomateos, pork, stews Savory-Eggs, salads, poultry, meat, rice, vegetables, soups, pork Tarragon-Meat, poultry, fish, eggs, butter, vegetables (licorice-like flavor)  Thyme-Meat, poultry, fish, eggs, vegetables, (clover-like flavor), sauces, soups Tumeric-Salads, butter, eggs, fish, rice, vegetables (saffron-like flavor) Vanilla Extract-Baked goods, candy Vinegar-Salads, vegetables, meat marinades Walnut Extract-baked goods, candy   2. Choose your Foods Wisely   The following is a list of foods to avoid which are high in sodium:  Meats-Avoid all smoked, canned, salt cured, dried and kosher meat and fish as well as Anchovies   [employer] meats:Bologna, Liverwurst, Pastrami Canned meat or fish  Marinated  herring Caviar    Pepperoni Corned Beef   Pizza Dried chipped beef  Salami Frozen breaded fish or meat Salt pork Frankfurters or hot dogs  Sardines Gefilte fish   Sausage Ham (boiled ham, Proscuitto Smoked butt    spiced ham)   Spam      TV Dinners Vegetables Canned vegetables (Regular) Relish Canned mushrooms  Sauerkraut Olives    Tomato juice Pickles  Bakery and Dessert Products Canned puddings  Cream pies Cheesecake   Decorated cakes Cookies  Beverages/Juices Tomato juice, regular  Gatorade   V-8 vegetable juice, regular  Breads and Cereals Biscuit mixes   Salted potato chips, corn chips, pretzels Bread stuffing mixes  Salted crackers and rolls Pancake and waffle mixes Self-rising flour  Seasonings Accent    Meat sauces Barbecue sauce  Meat tenderizer Catsup    Monosodium glutamate (MSG) Celery salt   Onion salt Chili sauce   Prepared mustard Garlic salt   Salt, seasoned salt, sea salt Gravy mixes   Soy sauce Horseradish   Steak sauce Ketchup   Tartar sauce Lite salt    Teriyaki sauce Marinade mixes   Worcestershire sauce  Others Baking powder   Cocoa and cocoa mixes Baking soda   Commercial casserole mixes Candy-caramels, chocolate  Dehydrated soups    Bars, fudge,nougats  Instant rice and pasta mixes Canned broth or soup  Maraschino cherries Cheese, aged and processed cheese and cheese spreads  Learning Assessment Quiz  Indicated T (for True) or F (for False) for each of the following statements:  _____ Fresh fruits and vegetables and unprocessed grains are generally low in sodium _____ Water may contain a considerable amount of sodium, depending on the source _____ You can always tell if a food is high in sodium by tasting it _____ Certain laxatives my be high in sodium and should be avoided unless prescribed   by a physician or pharmacist _____ Austin Morgan  substitutes may be used freely by anyone on a sodium restricted diet _____ Sodium is present in table  salt, food additives and as a natural component of   most foods _____ Table salt is approximately 90% sodium _____ Limiting sodium intake may help prevent excess fluid accumulation in the body _____ On a sodium-restricted diet, seasonings such as bouillon soy sauce, and    cooking wine should be used in place of table salt _____ On an ingredient list, a product which lists monosodium glutamate as the first   ingredient is an appropriate food to include on a low sodium diet  Circle the best answer(s) to the following statements (Hint: there may be more than one correct answer)  11. On a low-sodium diet, some acceptable snack items are:    A. Olives  F. Bean dip   K. Grapefruit juice    B. Salted Pretzels G. Commercial Popcorn   L. Canned peaches    C. Carrot Sticks  H. Bouillon   M. Unsalted nuts   D. [place] fries  I. Peanut butter crackers N. Salami   E. Sweet pickles J. Tomato Juice   O. Pizza  12.  Seasonings that may be used freely on a reduced - sodium diet include   A. Lemon wedges F.Monosodium glutamate K. Celery seed    B.Soysauce   G. Pepper   L. Mustard powder   C. Sea salt  H. Cooking wine  M. Onion flakes   D. Vinegar  E. Prepared horseradish N. Salsa   E. Sage   J. Worcestershire sauce  O. Chutney

## 2021-04-19 NOTE — Telephone Encounter (Signed)
Patient had labs this week. Can we please add on iron/tibc/ferritin. Dx: h/o anemia.

## 2021-04-19 NOTE — Progress Notes (Signed)
Primary Care Physician: Kathyrn Drown, MD  Primary Gastroenterologist:  Garfield Cornea, MD   Chief Complaint  Patient presents with   Cirrhosis    F/u   Anemia    F/u    HPI: Austin Morgan is a 65 y.o. male here for follow up. Last seen in 05/2020. H/O GERD, abnormal LFTs, alcohol abuse, normocytic anemia.   EGD/colonoscopy 01/2020. He had 9 colon polyps removed, multiple tubular adenomas. Next colonoscopy planned in 01/2023. His EGD was normal.  PCP ordered ultrasound with elastography June 2022, noted to have coarsened hepatic echotexture with subtle contour nodularity, perihepatic ascites, spleen measuring 14 cm, median kPA 51.6.  Aneurysm dilation of the proximal aorta measuring 3.2 cm.  No LFTs available around that time.  Labs last week with MELD Na of 13.  Immunity to hepatitis A and B (he does not recall if he has ever had Hep B vaccines).  AFP tumor marker 1.4.  Historically has had mildly elevated alkaline phosphatase even when AST/ALT have been normal.  Labs from 2 days ago with alk phos of 154. Previous AMA negative, GGT 570 in 05/2020.   Today: Weight down 15 pounds. Contributes this to periods of being sober. Overall feels ok. He states over the past 6 months he has had relapse with etoh 1/3 of the time. He admits that when he drinks, he drinks a lot. He denies abdominal pain, melena, brbpr. No heartburn on pantoprazole. Rare regurgitation, usually only if eats too late. He has had some intermittent lower extremity edema, tends to be worse with drinking. BMs range from [place] 3, 4, 7. Sometimes incomplete.   He is no longer on Trulicity, stopped when he lost his insurance. He is concerned about his sugars being poorly controlled. Advised to follow up with PCP. He is no longer on relafen. Takes occasional Aleve if needed for pain.  Current Outpatient Medications  Medication Sig Dispense Refill   Ascorbic Acid (VITAMIN C) 500 MG CAPS Take 500 mg by mouth daily.      aspirin 81 MG tablet Take 81 mg by mouth daily.     blood glucose meter kit and supplies KIT Dispense based on patient and insurance preference. Use up to BID as directed. 1 each 0   Coenzyme Q10 (COQ10) 100 MG CAPS Take 100 mg by mouth daily.     cyclobenzaprine (FLEXERIL) 10 MG tablet Take 1/2-1 tab po TID prn back pain; may cause drowsiness (Patient taking differently: Take 5-10 mg by mouth 3 (three) times daily as needed for muscle spasms. Take 1/2-1 tab po TID prn back pain; may cause drowsiness) 30 tablet 0   fish oil-omega-3 fatty acids 1000 MG capsule Take 1 g by mouth 2 (two) times daily.      glipiZIDE (GLUCOTROL) 5 MG tablet Take two tablets po BID 180 tablet 1   hydrochlorothiazide (HYDRODIURIL) 25 MG tablet Take 1 tablet (25 mg total) by mouth daily. 30 tablet 6   hydrOXYzine (VISTARIL) 50 MG capsule Take 50 mg by mouth every 6 (six) hours as needed.     lisinopril (ZESTRIL) 5 MG tablet Take 1 tablet by mouth once daily 90 tablet 0   metFORMIN (GLUCOPHAGE) 500 MG tablet Take one tablet po bid 60 tablet 6   Multiple Vitamins-Minerals (MULTIVITAMIN WITH MINERALS) tablet Take 1 tablet by mouth daily.     nabumetone (RELAFEN) 500 MG tablet Take 1 tablet (500 mg total) by mouth 2 (two) times daily as needed.  For back pain 30 tablet 0   naltrexone (DEPADE) 50 MG tablet TAKE ONE TABLET BY MOUTH ONCE DAILY. 90 tablet 1   naproxen sodium (ANAPROX) 220 MG tablet Take 220 mg by mouth daily as needed (pain).      ondansetron (ZOFRAN) 8 MG tablet Take one tablet po TID prn nausea 20 tablet 4   ONETOUCH ULTRA test strip USE AS DIRECTED TO TEST BLOOD GLUCOSE TWICE DAILY. 50 each 1   pantoprazole (PROTONIX) 40 MG tablet TAKE (1) TABLET BY MOUTH ONCE DAILY. 30 tablet 11   pramipexole (MIRAPEX) 0.125 MG tablet TAKE 1 TABLET BY MOUTH NIGHTLY AT  LEAST  2  HOURS  BEFORE  BEDTIME  FOR  RESTLESS  LEGS 60 tablet 3   tadalafil (CIALIS) 20 MG tablet Take one tab every other day as needed for erectile  dysfunction 10 tablet 5   traZODone (DESYREL) 100 MG tablet Take 1 tablet (100 mg total) by mouth at bedtime as needed. for sleep 90 tablet 1   No current facility-administered medications for this visit.    Allergies as of 04/19/2021 - Review Complete 04/19/2021  Allergen Reaction Noted   Codeine  11/26/2008    ROS:  General: Negative for anorexia,  fever, chills, fatigue, weakness. See hpi ENT: Negative for hoarseness, difficulty swallowing , nasal congestion. CV: Negative for chest pain, angina, palpitations, dyspnea on exertion, peripheral edema. See hpi Respiratory: Negative for dyspnea at rest, dyspnea on exertion, cough, sputum, wheezing.  GI: See history of present illness. GU:  Negative for dysuria, hematuria, urinary incontinence, urinary frequency, nocturnal urination.  Endo: Negative for unusual weight change.    Physical Examination:   BP 112/72   Pulse 76   Temp (!) 97.3 F (36.3 C)   Ht 5' 7"  (1.702 m)   Wt 172 lb (78 kg)   BMI 26.94 kg/m   General: Well-nourished, well-developed in no acute distress.  Eyes: No icterus. Mouth: masked. Lungs: Clear to auscultation bilaterally.  Heart: Regular rate and rhythm, no murmurs rubs or gallops.  Abdomen: Bowel sounds are normal, nontender, nondistended, no  masses, no abdominal bruits or hernia , no rebound or guarding.  Liver edge easily palpated 2-3 inches below the RCM in MCL and extends into epigastric region. Spleen tip palpated with deep inspiration. Extremities: No lower extremity edema. No clubbing or deformities. Neuro: Alert and oriented x 4   Skin: Warm and dry, no jaundice.   Psych: Alert and cooperative, normal mood and affect.  Labs:  Lab Results  Component Value Date   CREATININE 1.46 (H) 04/17/2021   BUN 25 04/17/2021   NA 136 04/17/2021   K 4.2 04/17/2021   CL 98 04/17/2021   CO2 24 04/17/2021   Lab Results  Component Value Date   ALT 25 04/17/2021   AST 40 04/17/2021   GGT 570 (H)  06/01/2020   ALKPHOS 154 (H) 04/17/2021   BILITOT 1.0 04/17/2021   Lab Results  Component Value Date   WBC 7.0 12/05/2020   HGB 11.7 (L) 12/05/2020   HCT 34.9 (L) 12/05/2020   MCV 99 (H) 12/05/2020   PLT 104 (L) 12/05/2020   Lab Results  Component Value Date   INR 1.2 04/17/2021   Lab Results  Component Value Date   HGBA1C 7.8 (H) 12/05/2020    No results found for: IRON, TIBC, FERRITIN  Imaging Studies: No results found.   Assessment:  GERD: Symptoms well controlled.  Continue pantoprazole 40 mg daily.  Reinforced  antireflux measures.  Normocytic anemia: Labs last checked in June, hemoglobin remained mildly low at that time.  Also with mild thrombocytopenia, new finding.  MCV 99. Prior B12/folate normal last year. Will need to check iron.  EGD and colonoscopy up-to-date. Patient denies any overt GI bleeding. Suspect element of anemia of chronic disease in setting of cirrhosis, etoh abuse.   Cirrhosis: Suspected etoh related. Previously negative for Hep C. Abdominal U/S 10/2019 with fatty liver, normal spleen but Abdominal U/S with elastography 11/2020 now with cirrhotic hepatic morphology with evidence of portal hypertension including perihepatic ascites and splenomegaly. Median kPa: 51.6 (but unclear if patient was actively drinking at that time and acute hepatitis such as etoh hepatitis could false elevate kPa). Either way, the numbers are high enough and when paired with other findings, it is likely he does have cirrhosis. Recent MELD Na 13 (first baseline). He is immune to Hep A and B. Will need to follow twice yearly for labs and ultrasound for hepatoma screening. Encouraged etoh cessation.   Proximal aorta aneurysmal dilation measures 3.2cm.: Will follow at time of hepatoma screening ultrasounds.    Plan: We will continue to monitor twice yearly with labs and ultrasound for management of cirrhosis.  Plan for 08/2021 labs and U/S and office visit to follow.  Continue to  strive for complete etoh cessation. Patient continues to relapse as outlined. Encouraged him to use local resources for etoh counseling, AA meetings, etc.  Monitor for signs of hepatic decompensation.  Continue pantoprazole 40 mg daily. Use MiraLAX 1 capful as needed to help with incomplete stools. Will add on iron/TIBC/ferritin to current labs. Encouraged healthy diet, minimize sodium intake to no more than 2 g of salt daily.

## 2021-04-20 LAB — IRON,TIBC AND FERRITIN PANEL
Ferritin: 57 ng/mL (ref 30–400)
Iron Saturation: 16 % (ref 15–55)
Iron: 55 ug/dL (ref 38–169)
Total Iron Binding Capacity: 342 ug/dL (ref 250–450)
UIBC: 287 ug/dL (ref 111–343)

## 2021-04-20 LAB — SPECIMEN STATUS REPORT

## 2021-05-08 ENCOUNTER — Other Ambulatory Visit: Payer: Self-pay | Admitting: Family Medicine

## 2021-05-08 DIAGNOSIS — E119 Type 2 diabetes mellitus without complications: Secondary | ICD-10-CM

## 2021-05-09 ENCOUNTER — Other Ambulatory Visit: Payer: Self-pay

## 2021-05-09 DIAGNOSIS — E119 Type 2 diabetes mellitus without complications: Secondary | ICD-10-CM

## 2021-05-09 MED ORDER — GLIPIZIDE 5 MG PO TABS: ORAL_TABLET | ORAL | 1 refills | Status: DC

## 2021-05-10 ENCOUNTER — Encounter: Payer: Self-pay | Admitting: Family Medicine

## 2021-05-10 ENCOUNTER — Ambulatory Visit: Payer: 59 | Admitting: Family Medicine

## 2021-05-10 ENCOUNTER — Telehealth: Payer: Self-pay | Admitting: Family Medicine

## 2021-05-10 ENCOUNTER — Other Ambulatory Visit: Payer: Self-pay

## 2021-05-10 VITALS — BP 132/70 | Temp 97.9°F | Wt 173.6 lb

## 2021-05-10 DIAGNOSIS — E785 Hyperlipidemia, unspecified: Secondary | ICD-10-CM

## 2021-05-10 DIAGNOSIS — E119 Type 2 diabetes mellitus without complications: Secondary | ICD-10-CM | POA: Diagnosis not present

## 2021-05-10 DIAGNOSIS — I10 Essential (primary) hypertension: Secondary | ICD-10-CM

## 2021-05-10 DIAGNOSIS — Z79899 Other long term (current) drug therapy: Secondary | ICD-10-CM

## 2021-05-10 MED ORDER — LEVEMIR FLEXTOUCH 100 UNIT/ML ~~LOC~~ SOPN: PEN_INJECTOR | SUBCUTANEOUS | 5 refills | Status: DC

## 2021-05-10 MED ORDER — GLIPIZIDE 5 MG PO TABS: ORAL_TABLET | ORAL | 1 refills | Status: DC

## 2021-05-10 NOTE — Telephone Encounter (Signed)
Per [place] per [facility], both Lantus and Levemir are covered with a $70 copay. (Does include pen needles) please advise. Thank you

## 2021-05-10 NOTE — Patient Instructions (Addendum)
Reduce glipizide to one in the morning and one in the evening  Send [place] update in 1 week  Start 8 units long acting insulin tonight  It is very important to watch closely how your sugars are doing.  We do not want to have any low sugars.  Our goal is to see morning sugars between 100 and 140. Please send a MyChart message regarding your readings within 1 week More than likely we will adjust up insulin from increase of 2 units to an increase of 4 units depending on how your readings are doing  If any questions or concerns call or send message thanks-Dr. Nicki Reaper  Please do lab work before your next office visit in 2 to 3 months

## 2021-05-10 NOTE — Progress Notes (Signed)
   Subjective:    Patient ID: Austin Morgan, male    DOB: 28-Oct-1955, 65 y.o.   MRN: [id number]  HPI Pt states his sugars have been elevated yesterday and today. Sugars will be in a decent range for 2-3 days then will become elevated. Yesterday sugar was 350; today 450. Pt wife is wondering if pt should be on low dose long acting insulin.  Glucose readings severely elevated been present over the past several weeks this may throughout the late summer early fall they were mildly elevated.  Patient has been doing the best he can and trying to watch things closely States his numbers are just not improving Many of his readings are in the 350 or more range.  Denies excessive thirst.  Review of Systems     Objective:   Physical Exam  Lungs clear heart regular pulse normal BP good      Assessment & Plan:  Severely elevated glucose readings With his health history I believe his pancreas is no longer producing adequate insulin Reduce the glipizide to 5 mg twice daily Recommend insulin to be 8 units long-acting insulin each evening will adjust upward anywhere from 2 units to 4 units based upon readings every 3 to 7 days patient to keep [place] updated via MyChart parameters were written out for the patient to follow any questions concerns issues to ask and also notify us as well as weekly updates follow-up again in 2 to 3 months with lab work before that visit  It is possible he may end up on short acting insulin as well

## 2021-05-10 NOTE — Telephone Encounter (Signed)
Prescription sent electronically to pharmacy. Patient notified. 

## 2021-05-10 NOTE — Telephone Encounter (Signed)
I recommend Levemir 8 units each evening may titrate up to 40 units under direction of physician 1 month supply with 5 refills and needle tips

## 2021-06-12 ENCOUNTER — Other Ambulatory Visit: Payer: Self-pay | Admitting: Family Medicine

## 2021-07-17 DIAGNOSIS — E119 Type 2 diabetes mellitus without complications: Secondary | ICD-10-CM | POA: Diagnosis not present

## 2021-07-26 DIAGNOSIS — E119 Type 2 diabetes mellitus without complications: Secondary | ICD-10-CM | POA: Diagnosis not present

## 2021-07-27 DIAGNOSIS — E1165 Type 2 diabetes mellitus with hyperglycemia: Secondary | ICD-10-CM | POA: Diagnosis not present

## 2021-07-27 DIAGNOSIS — K219 Gastro-esophageal reflux disease without esophagitis: Secondary | ICD-10-CM | POA: Diagnosis not present

## 2021-07-27 DIAGNOSIS — G2581 Restless legs syndrome: Secondary | ICD-10-CM | POA: Diagnosis not present

## 2021-07-27 DIAGNOSIS — R69 Illness, unspecified: Secondary | ICD-10-CM | POA: Diagnosis not present

## 2021-07-27 DIAGNOSIS — G4700 Insomnia, unspecified: Secondary | ICD-10-CM | POA: Diagnosis not present

## 2021-07-27 DIAGNOSIS — F1021 Alcohol dependence, in remission: Secondary | ICD-10-CM | POA: Diagnosis not present

## 2021-07-27 DIAGNOSIS — N529 Male erectile dysfunction, unspecified: Secondary | ICD-10-CM | POA: Diagnosis not present

## 2021-07-27 DIAGNOSIS — Z7984 Long term (current) use of oral hypoglycemic drugs: Secondary | ICD-10-CM | POA: Diagnosis not present

## 2021-07-27 DIAGNOSIS — Z811 Family history of alcohol abuse and dependence: Secondary | ICD-10-CM | POA: Diagnosis not present

## 2021-07-27 DIAGNOSIS — I10 Essential (primary) hypertension: Secondary | ICD-10-CM | POA: Diagnosis not present

## 2021-07-27 DIAGNOSIS — Z87891 Personal history of nicotine dependence: Secondary | ICD-10-CM | POA: Diagnosis not present

## 2021-07-27 DIAGNOSIS — Z794 Long term (current) use of insulin: Secondary | ICD-10-CM | POA: Diagnosis not present

## 2021-07-27 DIAGNOSIS — Z8249 Family history of ischemic heart disease and other diseases of the circulatory system: Secondary | ICD-10-CM | POA: Diagnosis not present

## 2021-07-27 DIAGNOSIS — Z008 Encounter for other general examination: Secondary | ICD-10-CM | POA: Diagnosis not present

## 2021-08-15 ENCOUNTER — Telehealth: Payer: Self-pay

## 2021-08-15 DIAGNOSIS — E119 Type 2 diabetes mellitus without complications: Secondary | ICD-10-CM

## 2021-08-15 DIAGNOSIS — G479 Sleep disorder, unspecified: Secondary | ICD-10-CM

## 2021-08-15 DIAGNOSIS — G2581 Restless legs syndrome: Secondary | ICD-10-CM

## 2021-08-15 MED ORDER — PRAMIPEXOLE DIHYDROCHLORIDE 0.125 MG PO TABS: ORAL_TABLET | ORAL | 3 refills | Status: DC

## 2021-08-15 MED ORDER — METFORMIN HCL 500 MG PO TABS: ORAL_TABLET | ORAL | 3 refills | Status: DC

## 2021-08-15 MED ORDER — TRAZODONE HCL 100 MG PO TABS: 100.0000 mg | ORAL_TABLET | Freq: Every evening | ORAL | 3 refills | Status: DC | PRN

## 2021-08-15 NOTE — Telephone Encounter (Signed)
May have 90-day on all 3 ?

## 2021-08-15 NOTE — Telephone Encounter (Signed)
Left message for patient to return the call for additional details and recommendations.   

## 2021-08-15 NOTE — Telephone Encounter (Signed)
Pt called in requesting refills of  ? ?pramipexole (MIRAPEX) 0.125 MG  ?traZODone (DESYREL) 100 MG tablet  ?metFORMIN (GLUCOPHAGE) 500  ? ?Cb#: [phone] ? ? ?

## 2021-08-30 ENCOUNTER — Other Ambulatory Visit: Payer: Self-pay | Admitting: Internal Medicine

## 2021-08-30 NOTE — Telephone Encounter (Signed)
Last ov 04/19/21 ?

## 2021-09-04 ENCOUNTER — Other Ambulatory Visit: Payer: Self-pay | Admitting: Family Medicine

## 2021-09-14 ENCOUNTER — Other Ambulatory Visit: Payer: Self-pay | Admitting: *Deleted

## 2021-09-14 ENCOUNTER — Ambulatory Visit (INDEPENDENT_AMBULATORY_CARE_PROVIDER_SITE_OTHER): Payer: Medicare HMO | Admitting: Family Medicine

## 2021-09-14 VITALS — BP 128/82 | Ht 67.0 in | Wt 187.4 lb

## 2021-09-14 DIAGNOSIS — K7030 Alcoholic cirrhosis of liver without ascites: Secondary | ICD-10-CM

## 2021-09-14 DIAGNOSIS — E119 Type 2 diabetes mellitus without complications: Secondary | ICD-10-CM | POA: Diagnosis not present

## 2021-09-14 DIAGNOSIS — R69 Illness, unspecified: Secondary | ICD-10-CM | POA: Diagnosis not present

## 2021-09-14 DIAGNOSIS — G2581 Restless legs syndrome: Secondary | ICD-10-CM

## 2021-09-14 DIAGNOSIS — Z23 Encounter for immunization: Secondary | ICD-10-CM | POA: Diagnosis not present

## 2021-09-14 DIAGNOSIS — K068 Other specified disorders of gingiva and edentulous alveolar ridge: Secondary | ICD-10-CM

## 2021-09-14 DIAGNOSIS — Z79899 Other long term (current) drug therapy: Secondary | ICD-10-CM | POA: Diagnosis not present

## 2021-09-14 DIAGNOSIS — E1169 Type 2 diabetes mellitus with other specified complication: Secondary | ICD-10-CM | POA: Diagnosis not present

## 2021-09-14 DIAGNOSIS — F1021 Alcohol dependence, in remission: Secondary | ICD-10-CM

## 2021-09-14 DIAGNOSIS — R600 Localized edema: Secondary | ICD-10-CM | POA: Diagnosis not present

## 2021-09-14 DIAGNOSIS — R011 Cardiac murmur, unspecified: Secondary | ICD-10-CM

## 2021-09-14 DIAGNOSIS — E785 Hyperlipidemia, unspecified: Secondary | ICD-10-CM

## 2021-09-14 MED ORDER — PRAMIPEXOLE DIHYDROCHLORIDE 0.25 MG PO TABS: ORAL_TABLET | ORAL | 5 refills | Status: DC

## 2021-09-14 MED ORDER — TADALAFIL 20 MG PO TABS: 10.0000 mg | ORAL_TABLET | ORAL | 11 refills | Status: DC | PRN

## 2021-09-14 NOTE — Progress Notes (Signed)
? ?  Subjective:  ? ? Patient ID: Austin Morgan, male    DOB: February 28, 1956, 66 y.o.   MRN: [id number] ? ?Diabetes ?He presents for his follow-up diabetic visit. He has type 2 diabetes mellitus. Current diabetic treatment includes insulin injections and oral agent (dual therapy). He is compliant with treatment all of the time.  ? ? ?Bleeding gums - Plan: CBC with Differential/Platelet ? ?Restless leg syndrome - Plan: pramipexole (MIRAPEX) 0.25 MG tablet ? ?Diabetes mellitus without complication ([place]) - Plan: Microalbumin / creatinine urine ratio, Hemoglobin A1c ? ?Hyperlipidemia associated with type 2 diabetes mellitus ([place]) - Plan: Lipid panel ? ?Murmur, cardiac - Plan: CANCELED: ECHOCARDIOGRAM COMPLETE ? ?Need for vaccination - Plan: Pneumococcal conjugate vaccine 20-valent ? ?Pedal edema - Plan: Brain natriuretic peptide ? ?High risk medication use - Plan: CBC with Differential/Platelet, Hepatic function panel ? ?Patient doing a good job of staying away from alcohol ?He is trying to exercise more and trying to eat healthier ?States his sugars been under decent control using his insulin and his medications ?In addition to this states his moods are doing well ?Has noticed some swelling in his ankles but denies any DOE or PND.  Denies any chest pressure tightness or pain ?He does have underlying liver issues possible cirrhosis issues followed by gastroenterology ?Needs rx for muscle relaxer due to exercising and restless legs med is not helping as much and double the dose the last few days and it helped. ? ?Review of Systems ? ?   ?Objective:  ? Physical Exam ? ?General-in no acute distress ?Eyes-no discharge ?Lungs-respiratory rate normal, CTA ?CV-no murmurs,RRR ?Extremities skin warm dry no edema ?Neuro grossly normal ?Behavior normal, alert ? ? ? ?   ?Assessment & Plan:  ?1. Restless leg syndrome ?Restless legs ?Current dosing 0.125 not doing enough ?He states when he took 2 of the tablets it did better ?Bump up the  dose ?He will let us know if any problems ?- pramipexole (MIRAPEX) 0.25 MG tablet; TAKE 1 TABLET BY MOUTH NIGHTLY AT  LEAST  2  HOURS  BEFORE  BEDTIME  FOR  RESTLESS  LEGS  Dispense: 30 tablet; Refill: 5 ? ?2. Bleeding gums ?Check CBC.  Await the results of this.  Look at platelets. ? ?3. Diabetes mellitus without complication ([place]) ?Check lab work.  Continue current medications.  May need Jardiance because of pedal edema and depending on A1c. ?- Microalbumin / creatinine urine ratio ?- Hemoglobin A1c ? ?4. Hyperlipidemia associated with type 2 diabetes mellitus ([place]) ?May well need to be back on a statin would have to coordinate this with gastroenterology if that is not a possibility to be on that medicine then Repatha ?- Lipid panel ? ?5. Murmur, cardiac ?Patient with a murmur go ahead with echo await the results ?- ECHOCARDIOGRAM COMPLETE; Future ? ?6. Need for vaccination ?Up-to-date pneumococcal ?- Pneumococcal conjugate vaccine 20-valent ? ?7. Pedal edema ?Pedal edema with murmur check BNP ?- Brain natriuretic peptide ? ?8. High risk medication use ?Check labs ?- CBC with Differential/Platelet ?- Hepatic function panel ? ?Await the findings of his lab work may well need further intervention regarding possibility of underlying heart failure versus cirrhosis issues causing the pedal edema. ?

## 2021-09-14 NOTE — Patient Instructions (Signed)

## 2021-09-19 DIAGNOSIS — Z79899 Other long term (current) drug therapy: Secondary | ICD-10-CM | POA: Diagnosis not present

## 2021-09-19 DIAGNOSIS — E1169 Type 2 diabetes mellitus with other specified complication: Secondary | ICD-10-CM | POA: Diagnosis not present

## 2021-09-19 DIAGNOSIS — E119 Type 2 diabetes mellitus without complications: Secondary | ICD-10-CM | POA: Diagnosis not present

## 2021-09-19 DIAGNOSIS — R600 Localized edema: Secondary | ICD-10-CM | POA: Diagnosis not present

## 2021-09-19 DIAGNOSIS — E785 Hyperlipidemia, unspecified: Secondary | ICD-10-CM | POA: Diagnosis not present

## 2021-09-20 ENCOUNTER — Telehealth: Payer: Self-pay | Admitting: Nurse Practitioner

## 2021-09-20 ENCOUNTER — Observation Stay (HOSPITAL_COMMUNITY)
Admission: EM | Admit: 2021-09-20 | Discharge: 2021-09-21 | Disposition: A | Discharge status: Home / Self Care | Payer: Medicare HMO | Attending: Internal Medicine | Admitting: Internal Medicine

## 2021-09-20 ENCOUNTER — Encounter: Payer: Self-pay | Admitting: Nurse Practitioner

## 2021-09-20 ENCOUNTER — Encounter (HOSPITAL_COMMUNITY): Payer: Self-pay

## 2021-09-20 ENCOUNTER — Other Ambulatory Visit: Payer: Self-pay

## 2021-09-20 ENCOUNTER — Encounter: Payer: Self-pay | Admitting: Family Medicine

## 2021-09-20 DIAGNOSIS — K219 Gastro-esophageal reflux disease without esophagitis: Secondary | ICD-10-CM | POA: Diagnosis present

## 2021-09-20 DIAGNOSIS — K7460 Unspecified cirrhosis of liver: Secondary | ICD-10-CM | POA: Diagnosis present

## 2021-09-20 DIAGNOSIS — N179 Acute kidney failure, unspecified: Secondary | ICD-10-CM | POA: Diagnosis not present

## 2021-09-20 DIAGNOSIS — Z794 Long term (current) use of insulin: Secondary | ICD-10-CM | POA: Diagnosis not present

## 2021-09-20 DIAGNOSIS — Z87891 Personal history of nicotine dependence: Secondary | ICD-10-CM | POA: Insufficient documentation

## 2021-09-20 DIAGNOSIS — K7030 Alcoholic cirrhosis of liver without ascites: Secondary | ICD-10-CM

## 2021-09-20 DIAGNOSIS — G2581 Restless legs syndrome: Secondary | ICD-10-CM | POA: Diagnosis not present

## 2021-09-20 DIAGNOSIS — Z7984 Long term (current) use of oral hypoglycemic drugs: Secondary | ICD-10-CM | POA: Insufficient documentation

## 2021-09-20 DIAGNOSIS — F32A Depression, unspecified: Secondary | ICD-10-CM | POA: Diagnosis present

## 2021-09-20 DIAGNOSIS — I10 Essential (primary) hypertension: Secondary | ICD-10-CM | POA: Diagnosis present

## 2021-09-20 DIAGNOSIS — E785 Hyperlipidemia, unspecified: Secondary | ICD-10-CM | POA: Diagnosis present

## 2021-09-20 DIAGNOSIS — D649 Anemia, unspecified: Secondary | ICD-10-CM | POA: Diagnosis not present

## 2021-09-20 DIAGNOSIS — N1831 Chronic kidney disease, stage 3a: Secondary | ICD-10-CM | POA: Insufficient documentation

## 2021-09-20 DIAGNOSIS — F419 Anxiety disorder, unspecified: Secondary | ICD-10-CM | POA: Diagnosis not present

## 2021-09-20 DIAGNOSIS — F1010 Alcohol abuse, uncomplicated: Secondary | ICD-10-CM | POA: Diagnosis not present

## 2021-09-20 DIAGNOSIS — E1121 Type 2 diabetes mellitus with diabetic nephropathy: Secondary | ICD-10-CM | POA: Insufficient documentation

## 2021-09-20 DIAGNOSIS — R69 Illness, unspecified: Secondary | ICD-10-CM | POA: Diagnosis not present

## 2021-09-20 DIAGNOSIS — N189 Chronic kidney disease, unspecified: Secondary | ICD-10-CM

## 2021-09-20 DIAGNOSIS — E119 Type 2 diabetes mellitus without complications: Secondary | ICD-10-CM

## 2021-09-20 DIAGNOSIS — R531 Weakness: Secondary | ICD-10-CM | POA: Diagnosis present

## 2021-09-20 DIAGNOSIS — Z79899 Other long term (current) drug therapy: Secondary | ICD-10-CM | POA: Insufficient documentation

## 2021-09-20 DIAGNOSIS — D61818 Other pancytopenia: Secondary | ICD-10-CM | POA: Diagnosis present

## 2021-09-20 DIAGNOSIS — I129 Hypertensive chronic kidney disease with stage 1 through stage 4 chronic kidney disease, or unspecified chronic kidney disease: Secondary | ICD-10-CM | POA: Insufficient documentation

## 2021-09-20 LAB — ABO/RH: ABO/RH(D): O POS

## 2021-09-20 LAB — COMPREHENSIVE METABOLIC PANEL
ALT: 19 U/L (ref 0–44)
AST: 28 U/L (ref 15–41)
Albumin: 2.9 g/dL — ABNORMAL LOW (ref 3.5–5.0)
Alkaline Phosphatase: 112 U/L (ref 38–126)
Anion gap: 9 (ref 5–15)
BUN: 31 mg/dL — ABNORMAL HIGH (ref 8–23)
CO2: 20 mmol/L — ABNORMAL LOW (ref 22–32)
Calcium: 8.7 mg/dL — ABNORMAL LOW (ref 8.9–10.3)
Chloride: 108 mmol/L (ref 98–111)
Creatinine, Ser: 1.87 mg/dL — ABNORMAL HIGH (ref 0.61–1.24)
GFR, Estimated: 39 mL/min — ABNORMAL LOW (ref >60)
Glucose, Bld: 158 mg/dL — ABNORMAL HIGH (ref 70–99)
Potassium: 4.1 mmol/L (ref 3.5–5.1)
Sodium: 137 mmol/L (ref 135–145)
Total Bilirubin: 0.4 mg/dL (ref 0.3–1.2)
Total Protein: 6.5 g/dL (ref 6.5–8.1)

## 2021-09-20 LAB — CBG MONITORING, ED: Glucose-Capillary: 126 mg/dL — ABNORMAL HIGH (ref 70–99)

## 2021-09-20 LAB — TROPONIN I (HIGH SENSITIVITY)
Troponin I (High Sensitivity): 5 ng/L (ref <18)
Troponin I (High Sensitivity): 4 ng/L (ref <18)

## 2021-09-20 LAB — CBC WITH DIFFERENTIAL/PLATELET
Abs Immature Granulocytes: 0.01 10*3/uL (ref 0.00–0.07)
Basophils Absolute: 0 10*3/uL (ref 0.0–0.1)
Basophils Relative: 0 %
Eosinophils Absolute: 0.2 10*3/uL (ref 0.0–0.5)
Eosinophils Relative: 7 %
HCT: 17.6 % — ABNORMAL LOW (ref 39.0–52.0)
Hemoglobin: 5.6 g/dL — CL (ref 13.0–17.0)
Immature Granulocytes: 0 %
Lymphocytes Relative: 25 %
Lymphs Abs: 0.8 10*3/uL (ref 0.7–4.0)
MCH: 29.5 pg (ref 26.0–34.0)
MCHC: 31.8 g/dL (ref 30.0–36.0)
MCV: 92.6 fL (ref 80.0–100.0)
Monocytes Absolute: 0.3 10*3/uL (ref 0.1–1.0)
Monocytes Relative: 9 %
Neutro Abs: 1.8 10*3/uL (ref 1.7–7.7)
Neutrophils Relative %: 59 %
Platelets: 122 10*3/uL — ABNORMAL LOW (ref 150–400)
RBC: 1.9 MIL/uL — ABNORMAL LOW (ref 4.22–5.81)
RDW: 13.1 % (ref 11.5–15.5)
WBC: 3.1 10*3/uL — ABNORMAL LOW (ref 4.0–10.5)
nRBC: 0 % (ref 0.0–0.2)

## 2021-09-20 LAB — GLUCOSE, CAPILLARY: Glucose-Capillary: 116 mg/dL — ABNORMAL HIGH (ref 70–99)

## 2021-09-20 LAB — PROTIME-INR
INR: 1.2 (ref 0.8–1.2)
Prothrombin Time: 15.1 seconds (ref 11.4–15.2)

## 2021-09-20 LAB — VITAMIN B12: Vitamin B-12: 709 pg/mL (ref 180–914)

## 2021-09-20 LAB — PREPARE RBC (CROSSMATCH)

## 2021-09-20 MED ORDER — INSULIN ASPART 100 UNIT/ML IJ SOLN
0.0000 [IU] | Freq: Three times a day (TID) | INTRAMUSCULAR | Status: DC
  Administered 2021-09-20: 1 [IU] via SUBCUTANEOUS
  Administered 2021-09-21: 2 [IU] via SUBCUTANEOUS
  Filled 2021-09-20: qty 1

## 2021-09-20 MED ORDER — PANTOPRAZOLE SODIUM 40 MG PO TBEC
40.0000 mg | DELAYED_RELEASE_TABLET | Freq: Two times a day (BID) | ORAL | Status: DC
  Administered 2021-09-20 – 2021-09-21 (×3): 40 mg via ORAL
  Filled 2021-09-20 (×3): qty 1

## 2021-09-20 MED ORDER — ONDANSETRON HCL 4 MG PO TABS: 4.0000 mg | ORAL_TABLET | Freq: Four times a day (QID) | ORAL | Status: DC | PRN

## 2021-09-20 MED ORDER — ACETAMINOPHEN 325 MG PO TABS
650.0000 mg | ORAL_TABLET | Freq: Four times a day (QID) | ORAL | Status: DC | PRN
Start: 2021-09-20 — End: 2021-09-21
  Administered 2021-09-21: 650 mg via ORAL
  Filled 2021-09-20: qty 2

## 2021-09-20 MED ORDER — ASCORBIC ACID 500 MG PO TABS
1000.0000 mg | ORAL_TABLET | Freq: Every day | ORAL | Status: DC
  Administered 2021-09-20 – 2021-09-21 (×2): 1000 mg via ORAL
  Filled 2021-09-20 (×2): qty 2

## 2021-09-20 MED ORDER — INSULIN GLARGINE-YFGN 100 UNIT/ML ~~LOC~~ SOLN
10.0000 [IU] | Freq: Every day | SUBCUTANEOUS | Status: DC
  Administered 2021-09-20 – 2021-09-21 (×2): 10 [IU] via SUBCUTANEOUS
  Filled 2021-09-20 (×4): qty 0.1

## 2021-09-20 MED ORDER — INSULIN ASPART 100 UNIT/ML IJ SOLN: 0.0000 [IU] | Freq: Every day | INTRAMUSCULAR | Status: DC

## 2021-09-20 MED ORDER — TRAZODONE HCL 50 MG PO TABS
100.0000 mg | ORAL_TABLET | Freq: Every evening | ORAL | Status: DC | PRN
  Administered 2021-09-20: 100 mg via ORAL
  Filled 2021-09-20: qty 2

## 2021-09-20 MED ORDER — ACETAMINOPHEN 650 MG RE SUPP: 650.0000 mg | Freq: Four times a day (QID) | RECTAL | Status: DC | PRN

## 2021-09-20 MED ORDER — SODIUM CHLORIDE 0.9% IV SOLUTION: Freq: Once | INTRAVENOUS | Status: DC

## 2021-09-20 MED ORDER — ONDANSETRON HCL 4 MG/2ML IJ SOLN: 4.0000 mg | Freq: Four times a day (QID) | INTRAMUSCULAR | Status: DC | PRN

## 2021-09-20 MED ORDER — PRAMIPEXOLE DIHYDROCHLORIDE 1 MG PO TABS
0.5000 mg | ORAL_TABLET | Freq: Every day | ORAL | Status: DC
  Administered 2021-09-20: 0.5 mg via ORAL
  Filled 2021-09-20: qty 1

## 2021-09-20 NOTE — Assessment & Plan Note (Addendum)
-  Patient reports complete abstinence for the last 8 months at least ?-No acute withdrawal event reported. ?-Patient has been congratulated and encouraged to maintain complete abstinence from alcohol. ?

## 2021-09-20 NOTE — Assessment & Plan Note (Addendum)
-  Resume fish oil as an outpatient ?-Patient advised to follow heart healthy diet ?-Recent lipid panel demonstrating stable levels. ?

## 2021-09-20 NOTE — ED Notes (Signed)
CRITICAL VALUE STICKER ? ?CRITICAL VALUE: Hgb 5.6 ? ?RECEIVER (on-site recipient of call):I. Julene Rahn ? ?DATE & TIME NOTIFIED: 09/20/21 0829 ? ?MESSENGER (representative from lab):Lab ? ?MD NOTIFIED: [place] ? ?TIME OF NOTIFICATION:0830 09/20/21 ? ?RESPONSE:  ? ?

## 2021-09-20 NOTE — Plan of Care (Signed)

## 2021-09-20 NOTE — Assessment & Plan Note (Addendum)
-   In the setting of what appears to be bone marrow suppression from chronic alcohol abuse. ?-Patient reported no vomiting or noticing any melena, hematochezia or bright red blood per rectum. ?-Case discussed with gastroenterology service who recommended transfusion to stabilize hemoglobin and outpatient follow-up for repeat endoscopic screening evaluation. ?-Patient reported no further episodes of epistaxis and bleeding out of his gums. ?-He also denies hematuria. ?-3 units of PRBCs were provided during this hospitalization. ?-At time of admission hemoglobin 5.6. ?-Patient reported improvement in his abdominal symptoms of anemia and expressed no chest pain or shortness of breath at time of discharge. ?

## 2021-09-20 NOTE — Assessment & Plan Note (Addendum)
-  Overall stable and within normal limits ?-In the setting of low hemoglobin and acute kidney injury his antihypertensive agents were initially held and subsequently resumed at adjusted dose. ?-Patient advised to maintain adequate hydration and to follow low-sodium diet. ?-Reassess blood pressure and adjust management as needed at follow-up visit. ? ?

## 2021-09-20 NOTE — ED Triage Notes (Signed)
Pt arrives via pov. Pt reports he had some routine blood work done yesterday and was told his Hgb was 5.4. pt c/o generalized fatigue over the past several days. Reports he was recently having frequent nose bleeds and bleeding gums.  ?

## 2021-09-20 NOTE — ED Provider Notes (Signed)
?[facility] ?Provider Note ? ?CSN: [id number] ?Arrival date & time: 09/20/21 6160 ? ?Chief Complaint(s) ?Fatigue and Abnormal Lab ? ?HPI ?Austin Morgan is a 66 y.o. male with PMH T2DM, gout, alcohol abuse, hepatic cirrhosis who presents emergency department for evaluation of fatigue and abnormal labs.  Patient states that he has been drinking vodka for over 20 years daily, and after retiring 3 years ago his alcohol consumption has increased significantly.  Peak likely in 2019.  Patient has been sober for 1 year at this time.  Was seen by his primary care practitioner with complaints of bleeding gums and intermittent nosebleeds.  Routine lab work performed and patient found to have a hemoglobin less than 6.  Instructed to come the emergency department for further evaluation.  He endorses mild dyspnea and fatigue with exertion but denies hematochezia, black stools, nausea, vomiting, chest pain or other systemic symptoms. ? ? ?Abnormal Lab ? ?Past Medical History ?Past Medical History:  ?Diagnosis Date  ? Arthritis   ? Diabetes mellitus without complication ([place])   ? ED (erectile dysfunction)   ? Gout   ? H/O ETOH abuse   ? Hyperlipidemia   ? Hypertension   ? Hypertriglyceridemia   ? PONV (postoperative nausea and vomiting)   ? Sleep apnea   ? ?Patient Active Problem List  ? Diagnosis Date Noted  ? Symptomatic anemia 09/20/2021  ? Hepatic cirrhosis ([place]) 04/19/2021  ? Elevated alkaline phosphatase level 05/17/2020  ? Aortic aneurysm ([place]) 10/22/2019  ? GERD (gastroesophageal reflux disease) 10/14/2019  ? Constipation 10/14/2019  ? Colon cancer screening 10/14/2019  ? Macrocytic anemia 09/19/2019  ? Recovering alcoholic in remission ([place]) 09/18/2019  ? Sleep disturbance 09/18/2019  ? Restless leg syndrome 04/17/2019  ? Elevated liver enzymes 03/20/2019  ? Alcohol abuse 01/04/2018  ? Obstructive sleep apnea 02/26/2017  ? Anxiety and depression 12/27/2015  ? Gout 01/19/2014  ? Erectile dysfunction  07/13/2013  ? Hypertriglyceridemia 09/26/2012  ? Diabetes mellitus without complication ([place])   ? Dyslipidemia (high LDL; low HDL)   ? Hypertension   ? ?Home Medication(s) ?Prior to Admission medications   ?Medication Sig Start Date End Date Taking? Authorizing Provider  ?Ascorbic Acid (VITAMIN C) 500 MG CAPS Take 500 mg by mouth daily.   Yes [provider]  ?fish oil-omega-3 fatty acids 1000 MG capsule Take 1 g by mouth 2 (two) times daily.    Yes [provider]  ?glipiZIDE (GLUCOTROL) 5 MG tablet Take one tablet po BID ?Patient taking differently: Take 5 mg by mouth 2 (two) times daily before a meal. 05/10/21  Yes Luking, Elayne Snare, MD  ?hydrochlorothiazide (HYDRODIURIL) 25 MG tablet Take 1 tablet by mouth once daily 09/04/21  Yes Luking, Scott A, MD  ?insulin detemir (LEVEMIR FLEXTOUCH) 100 UNIT/ML FlexPen 8 units qpm may titrate up to 40 units under direction of physician 05/10/21  Yes Kathyrn Drown, MD  ?lisinopril (ZESTRIL) 5 MG tablet Take 1 tablet by mouth once daily 03/23/21  Yes Luking, Scott A, MD  ?metFORMIN (GLUCOPHAGE) 500 MG tablet Take one tablet po bid ?Patient taking differently: Take 500 mg by mouth 2 (two) times daily with a meal. 08/15/21  Yes Luking, Elayne Snare, MD  ?Multiple Vitamins-Minerals (MULTIVITAMIN WITH MINERALS) tablet Take 1 tablet by mouth daily.   Yes [provider]  ?naltrexone (DEPADE) 50 MG tablet TAKE ONE TABLET BY MOUTH ONCE DAILY. 01/02/21  Yes Kathyrn Drown, MD  ?naproxen sodium (ANAPROX) 220 MG tablet Take 220  mg by mouth daily as needed (pain).    Yes [provider]  ?pantoprazole (PROTONIX) 40 MG tablet Take 1 tablet by mouth once daily 08/30/21  Yes Mahala Menghini, PA-C  ?pramipexole (MIRAPEX) 0.25 MG tablet TAKE 1 TABLET BY MOUTH NIGHTLY AT  LEAST  2  HOURS  BEFORE  BEDTIME  FOR  RESTLESS  LEGS ?Patient taking differently: Take 0.5 mg by mouth at bedtime. TAKE 1 TABLET BY MOUTH NIGHTLY AT  LEAST  2  HOURS  BEFORE  BEDTIME  FOR   RESTLESS  LEGS 09/14/21  Yes Kathyrn Drown, MD  ?traZODone (DESYREL) 100 MG tablet Take 1 tablet (100 mg total) by mouth at bedtime as needed. for sleep 08/15/21  Yes Kathyrn Drown, MD  ?blood glucose meter kit and supplies KIT Dispense based on patient and insurance preference. Use up to BID as directed. 03/20/19   Nilda Simmer, NP  ?cyclobenzaprine (FLEXERIL) 10 MG tablet Take 1/2-1 tab po TID prn back pain; may cause drowsiness ?Patient not taking: Reported on 09/20/2021 12/25/19   Nilda Simmer, NP  ?ondansetron (ZOFRAN) 8 MG tablet Take one tablet po TID prn nausea ?Patient not taking: Reported on 09/20/2021 08/25/20   Kathyrn Drown, MD  ?[facility] ULTRA test strip USE AS DIRECTED TO TEST BLOOD GLUCOSE TWICE DAILY. 10/02/19   Kathyrn Drown, MD  ?tadalafil (CIALIS) 20 MG tablet Take one tab every other day as needed for erectile dysfunction ?Patient not taking: Reported on 09/20/2021 02/02/20   Kathyrn Drown, MD  ?tadalafil (CIALIS) 20 MG tablet Take 0.5-1 tablets (10-20 mg total) by mouth every other day as needed for erectile dysfunction. ?Patient not taking: Reported on 09/20/2021 09/14/21   Kathyrn Drown, MD  ?                                                                                                                                  ?Past Surgical History ?Past Surgical History:  ?Procedure Laterality Date  ? CARPAL TUNNEL RELEASE Bilateral   ? COLONOSCOPY  2010  ? Dr. Delfin Edis: mild diverticulosis, next colonoscopy 2020  ? COLONOSCOPY WITH PROPOFOL N/A 01/21/2020  ? Rourk: 9 polyps removed, multiple tubular adenomas.  Next colonoscopy in 3 years.  ? ESOPHAGOGASTRODUODENOSCOPY (EGD) WITH PROPOFOL N/A 01/21/2020  ? Rourk: normal  ? NECK SURGERY    ? c4  ? POLYPECTOMY  01/21/2020  ? Procedure: POLYPECTOMY;  Surgeon: [name], MD;  [facility];  Service: Endoscopy;;  colon  ? ?Family History ?Family History  ?Problem Relation Age of Onset  ? Heart attack Father   ? Colon cancer  Neg Hx   ? ? ?Social History ?Social History  ? ?Tobacco Use  ? Smoking status: Former  ?  Packs/day: 1.00  ?  Years: 36.00  ?  Pack years: 36.00  ?  Types: Cigarettes  ?  Quit date:  03/11/2016  ?  Years since quitting: 5.5  ? Smokeless tobacco: Never  ?Vaping Use  ? Vaping Use: Never used  ?Substance Use Topics  ? Alcohol use: Not Currently  ?  Comment: drinks a fifth of liquor per week; inpatient rehab 08/2019. denied 10/14/19  ? Drug use: No  ? ?Allergies ?Codeine ? ?Review of Systems ?Review of Systems  ?Constitutional:  Positive for fatigue.  ?HENT:  Positive for nosebleeds.   ?Hematological:  Bruises/bleeds easily.  ? ?Physical Exam ?Vital Signs  ?I have reviewed the triage vital signs ?BP 113/70   Pulse 84   Temp 97.9 ?F (36.6 ?C) (Oral)   Resp 17   SpO2 99%  ? ?Physical Exam ?Vitals and nursing note reviewed.  ?Constitutional:   ?   General: He is not in acute distress. ?   Appearance: He is well-developed.  ?HENT:  ?   Head: Normocephalic and atraumatic.  ?Eyes:  ?   Conjunctiva/sclera: Conjunctivae normal.  ?Cardiovascular:  ?   Rate and Rhythm: Normal rate and regular rhythm.  ?   Heart sounds: No murmur heard. ?Pulmonary:  ?   Effort: Pulmonary effort is normal. No respiratory distress.  ?   Breath sounds: Normal breath sounds.  ?Abdominal:  ?   Palpations: Abdomen is soft.  ?   Tenderness: There is no abdominal tenderness.  ?Musculoskeletal:     ?   General: No swelling.  ?   Cervical back: Neck supple.  ?Skin: ?   General: Skin is warm and dry.  ?   Capillary Refill: Capillary refill takes less than 2 seconds.  ?Neurological:  ?   Mental Status: He is alert.  ?Psychiatric:     ?   Mood and Affect: Mood normal.  ? ? ?ED Results and Treatments ?Labs ?(all labs ordered are listed, but only abnormal results are displayed) ?Labs Reviewed  ?COMPREHENSIVE METABOLIC PANEL - Abnormal; Notable for the following components:  ?    Result Value  ? CO2 20 (*)   ? Glucose, Bld 158 (*)   ? BUN 31 (*)   ? Creatinine,  Ser 1.87 (*)   ? Calcium 8.7 (*)   ? Albumin 2.9 (*)   ? GFR, Estimated 39 (*)   ? All other components within normal limits  ?CBC WITH DIFFERENTIAL/PLATELET - Abnormal; Notable for the following components:  ? WBC 3.1

## 2021-09-20 NOTE — Telephone Encounter (Signed)
Virtual Visit via Telephone Note ? ? I connected with the wife of [place], Quinter on 09/20/2021 at 5:40am for a critical lab value, hemoglobin 5.9. Received critical lab result from [facility] for a hemoglobin of 5.9 at 5:20am 09/20/2021. Informed wife of lab result and informed her to take husband to emergency room to be evaluated. Wife stated understanding.  ? ?Wife stated understanding.  ?   ?Location: Home ? ?Patient: home ? ?Provider: Home office ?  ?I provided 5 minutes of non-face-to-face time during this encounter.  ? ?

## 2021-09-20 NOTE — Assessment & Plan Note (Addendum)
-  Continue the use of Mirapex. ?

## 2021-09-20 NOTE — Telephone Encounter (Signed)
Patient did go to the emergency department being seen there ?

## 2021-09-20 NOTE — Assessment & Plan Note (Signed)
-  Overall mood is a stable ?-No suicidal ideation or hallucinations. ?-Continue as needed trazodone. ?

## 2021-09-20 NOTE — H&P (Signed)
?History and Physical  ? ? ?Patient: Austin Morgan NFA:[id number] DOB: [birth year] ?DOA: 09/20/2021 ?DOS: the patient was seen and examined on 09/20/2021 ?PCP: Kathyrn Drown, MD  ?Patient coming from: Home ? ?Chief Complaint:  ?Chief Complaint  ?Patient presents with  ? Fatigue  ? Abnormal Lab  ? ?HPI: Austin Morgan is a 66 y.o. male with medical history significant of Hepatic cirrhosis, history of alcohol abuse (currently in complete abstinence for the last 8-9 months), hypertension, mixed hyperlipidemia, type 2 diabetes mellitus with nephropathy (chronic kidney disease a stage IIIa), depression/anxiety, restless leg syndrome and prior history of tobacco abuse (patient has quit about 5 years ago); presented to the hospital secondary to increased fatigue, weakness and short winded sensation with activity.  He was seen by PCP and found to have a hemoglobin of 5.9 with instructions to come to the hospital for further evaluation, management and transfusion. ?Patient reports no hematemesis, no hematochezia, no melena, no chest pain, no palpitations, no focal weakness, no dysuria. ?He expressed approximately 7-10 days of intermittent hemoptysis and bleeding out of his gums. ? ?In the ED hemoglobin level 5.6, acute kidney injury with creatinine of 1.8 (baseline in the 1.3--1.4 range), no acute ischemic changes appreciated on telemetry while in the ED.  IV line was placed, patient was typed and crossed and 2 units PRBCs were ordered.  TRH contacted for patient to be placed in the hospital for further evaluation and management. ? ?Review of Systems: As mentioned in the history of present illness. All other systems reviewed and are negative. ?Past Medical History:  ?Diagnosis Date  ? Arthritis   ? Diabetes mellitus without complication ([place])   ? ED (erectile dysfunction)   ? Gout   ? H/O ETOH abuse   ? Hyperlipidemia   ? Hypertension   ? Hypertriglyceridemia   ? PONV (postoperative nausea and vomiting)   ? Sleep apnea   ? ?Past  Surgical History:  ?Procedure Laterality Date  ? CARPAL TUNNEL RELEASE Bilateral   ? COLONOSCOPY  2010  ? Dr. Delfin Edis: mild diverticulosis, next colonoscopy 2020  ? COLONOSCOPY WITH PROPOFOL N/A 01/21/2020  ? Rourk: 9 polyps removed, multiple tubular adenomas.  Next colonoscopy in 3 years.  ? ESOPHAGOGASTRODUODENOSCOPY (EGD) WITH PROPOFOL N/A 01/21/2020  ? Rourk: normal  ? NECK SURGERY    ? c4  ? POLYPECTOMY  01/21/2020  ? Procedure: POLYPECTOMY;  Surgeon: [name], MD;  [facility];  Service: Endoscopy;;  colon  ? ?Social History:  reports that he quit smoking about 5 years ago. His smoking use included cigarettes. He has a 36.00 pack-year smoking history. He has never used smokeless tobacco. He reports that he does not currently use alcohol. He reports that he does not use drugs. ? ?Allergies  ?Allergen Reactions  ? Codeine   ?  REACTION: [place]/vomitig  ? ? ?Family History  ?Problem Relation Age of Onset  ? Heart attack Father   ? Colon cancer Neg Hx   ? ? ?Prior to Admission medications   ?Medication Sig Start Date End Date Taking? Authorizing Provider  ?Ascorbic Acid (VITAMIN C) 500 MG CAPS Take 500 mg by mouth daily.   Yes [provider]  ?fish oil-omega-3 fatty acids 1000 MG capsule Take 1 g by mouth 2 (two) times daily.    Yes [provider]  ?glipiZIDE (GLUCOTROL) 5 MG tablet Take one tablet po BID ?Patient taking differently: Take 5 mg by mouth 2 (two) times daily before a  meal. 05/10/21  Yes Kathyrn Drown, MD  ?hydrochlorothiazide (HYDRODIURIL) 25 MG tablet Take 1 tablet by mouth once daily 09/04/21  Yes Luking, Scott A, MD  ?insulin detemir (LEVEMIR FLEXTOUCH) 100 UNIT/ML FlexPen 8 units qpm may titrate up to 40 units under direction of physician 05/10/21  Yes Kathyrn Drown, MD  ?lisinopril (ZESTRIL) 5 MG tablet Take 1 tablet by mouth once daily 03/23/21  Yes Luking, Scott A, MD  ?metFORMIN (GLUCOPHAGE) 500 MG tablet Take one tablet po bid ?Patient taking  differently: Take 500 mg by mouth 2 (two) times daily with a meal. 08/15/21  Yes Luking, Elayne Snare, MD  ?Multiple Vitamins-Minerals (MULTIVITAMIN WITH MINERALS) tablet Take 1 tablet by mouth daily.   Yes [provider]  ?naltrexone (DEPADE) 50 MG tablet TAKE ONE TABLET BY MOUTH ONCE DAILY. 01/02/21  Yes Kathyrn Drown, MD  ?naproxen sodium (ANAPROX) 220 MG tablet Take 220 mg by mouth daily as needed (pain).    Yes [provider]  ?pantoprazole (PROTONIX) 40 MG tablet Take 1 tablet by mouth once daily 08/30/21  Yes Mahala Menghini, PA-C  ?pramipexole (MIRAPEX) 0.25 MG tablet TAKE 1 TABLET BY MOUTH NIGHTLY AT  LEAST  2  HOURS  BEFORE  BEDTIME  FOR  RESTLESS  LEGS ?Patient taking differently: Take 0.5 mg by mouth at bedtime. TAKE 1 TABLET BY MOUTH NIGHTLY AT  LEAST  2  HOURS  BEFORE  BEDTIME  FOR  RESTLESS  LEGS 09/14/21  Yes Kathyrn Drown, MD  ?traZODone (DESYREL) 100 MG tablet Take 1 tablet (100 mg total) by mouth at bedtime as needed. for sleep 08/15/21  Yes Kathyrn Drown, MD  ?blood glucose meter kit and supplies KIT Dispense based on patient and insurance preference. Use up to BID as directed. 03/20/19   Nilda Simmer, NP  ?cyclobenzaprine (FLEXERIL) 10 MG tablet Take 1/2-1 tab po TID prn back pain; may cause drowsiness ?Patient not taking: Reported on 09/20/2021 12/25/19   Nilda Simmer, NP  ?ondansetron (ZOFRAN) 8 MG tablet Take one tablet po TID prn nausea ?Patient not taking: Reported on 09/20/2021 08/25/20   Kathyrn Drown, MD  ?[facility] ULTRA test strip USE AS DIRECTED TO TEST BLOOD GLUCOSE TWICE DAILY. 10/02/19   Kathyrn Drown, MD  ?tadalafil (CIALIS) 20 MG tablet Take one tab every other day as needed for erectile dysfunction ?Patient not taking: Reported on 09/20/2021 02/02/20   Kathyrn Drown, MD  ?tadalafil (CIALIS) 20 MG tablet Take 0.5-1 tablets (10-20 mg total) by mouth every other day as needed for erectile dysfunction. ?Patient not taking: Reported on 09/20/2021 09/14/21   Kathyrn Drown, MD  ? ? ?Physical Exam: ?Vitals:  ? 09/20/21 1130 09/20/21 1356 09/20/21 1422 09/20/21 1615  ?BP: 110/67 121/73 118/77 108/90  ?Pulse: 83 83 85 82  ?Resp: _0 ?Temp:  97.7 ?F (36.5 ?C) 97.9 ?F (36.6 ?C) 97.8 ?F (36.6 ?C)  ?TempSrc:  Oral Oral Oral  ?SpO2: 100% 100% 95% 100%  ?Weight:      ?Height:      ? ?General exam: Alert, awake, oriented x 3; no chest pain, no nausea or vomiting.  Reports no palpitations or shortness of breath at rest. ?Respiratory system: Clear to auscultation. Respiratory effort normal.  Good saturation on room air. ?Cardiovascular system:RRR. No murmurs, rubs, gallops.  No JVD. ?Gastrointestinal system: Abdomen is nondistended, soft and nontender. No organomegaly or masses felt. Normal bowel sounds heard. ?Central nervous  system: Alert and oriented. No focal neurological deficits. ?Extremities: No cyanosis or clubbing. ?Skin: No petechiae. ?Psychiatry: Judgement and insight appear normal. Mood & affect appropriate.  ? ?Data Reviewed: ?Comprehensive metabolic panel demonstrating sodium 137, potassium 4.1, bicarb 20, blood sugar 158, BUN 31, creatinine 1.87; normal LFTs ?Troponin negative ?PT 15.1, INR 1.2 ?CBC with WBCs of 3.1, RBC 1.90, hemoglobin 5.6, platelets count 122 K. ? ?Assessment and Plan: ?* Symptomatic anemia ?- In the setting of bone marrow suppression from chronic alcohol abuse. ?-Patient reported no vomiting or noticing any melena, hematochezia or bright red blood per rectum. ?-Patient has also described no hematuria. ?-He has been experiencing for about a week or so prior to admission hemoptysis and bleeding from his gums; currently subsided. ?-Hemoglobin 5.9 approximately 2 days ago at the time he was seen in consultation by his PCP. ?-At time of admission hemoglobin 5.6. ?-Patient reported feeling weak, fatigue and short of breath with activity. ?-No chest pain, no tachypnea, no palpitations, no dizziness or lightheadedness. ?-Hemodynamically stable. ?-2  units of PRBCs has been ordered at time of admission ?-B12 demonstrated within normal limits levels. ?-Follow hemoglobin trend ?-PPI twice a day has been ordered. ?-Will discuss in a.m. with GI service the ne

## 2021-09-20 NOTE — Assessment & Plan Note (Signed)
-  Overall stable LFTs appreciated ?-Continue outpatient follow-up with GI service ?-Patient has been encouraged to continue complete alcohol abstinence. ?

## 2021-09-20 NOTE — Assessment & Plan Note (Addendum)
-  Continue PPI twice a day ?-Patient reporting no hematemesis, hematochezia or melena. ?-Advised to stop the use of NSAIDs. ?

## 2021-09-20 NOTE — Assessment & Plan Note (Addendum)
-  Recent A1c demonstrating labile 6.0 ?-Resume home hypoglycemic agents and outpatient follow-up with PCP. ?-Advised to follow multifactorial hydrate diet. ?

## 2021-09-20 NOTE — Assessment & Plan Note (Addendum)
-  stage 3a at baseline ?-AKI in the setting of anemia and decrease perfusion while continue using nephrotoxic agents. ?-After holding nephrotoxic agents and providing transfusion patient renal function improved and ?Shiley return back to his baseline. ?-Will recommend repeat basic metabolic panel to follow stability and trend. ?-Lisinopril and HCTZ dosages were adjusted at time of discharge. ?

## 2021-09-21 ENCOUNTER — Telehealth: Payer: Self-pay | Admitting: Family Medicine

## 2021-09-21 DIAGNOSIS — K219 Gastro-esophageal reflux disease without esophagitis: Secondary | ICD-10-CM | POA: Diagnosis not present

## 2021-09-21 DIAGNOSIS — F32A Depression, unspecified: Secondary | ICD-10-CM | POA: Diagnosis not present

## 2021-09-21 DIAGNOSIS — F1010 Alcohol abuse, uncomplicated: Secondary | ICD-10-CM | POA: Diagnosis not present

## 2021-09-21 DIAGNOSIS — E119 Type 2 diabetes mellitus without complications: Secondary | ICD-10-CM | POA: Diagnosis not present

## 2021-09-21 DIAGNOSIS — F419 Anxiety disorder, unspecified: Secondary | ICD-10-CM | POA: Diagnosis not present

## 2021-09-21 DIAGNOSIS — D649 Anemia, unspecified: Secondary | ICD-10-CM | POA: Diagnosis not present

## 2021-09-21 DIAGNOSIS — N189 Chronic kidney disease, unspecified: Secondary | ICD-10-CM | POA: Diagnosis not present

## 2021-09-21 DIAGNOSIS — G2581 Restless legs syndrome: Secondary | ICD-10-CM | POA: Diagnosis not present

## 2021-09-21 DIAGNOSIS — E785 Hyperlipidemia, unspecified: Secondary | ICD-10-CM

## 2021-09-21 DIAGNOSIS — R69 Illness, unspecified: Secondary | ICD-10-CM | POA: Diagnosis not present

## 2021-09-21 DIAGNOSIS — I10 Essential (primary) hypertension: Secondary | ICD-10-CM | POA: Diagnosis not present

## 2021-09-21 DIAGNOSIS — N179 Acute kidney failure, unspecified: Secondary | ICD-10-CM | POA: Diagnosis not present

## 2021-09-21 LAB — BASIC METABOLIC PANEL
Anion gap: 6 (ref 5–15)
BUN: 28 mg/dL — ABNORMAL HIGH (ref 8–23)
CO2: 22 mmol/L (ref 22–32)
Calcium: 8.8 mg/dL — ABNORMAL LOW (ref 8.9–10.3)
Chloride: 109 mmol/L (ref 98–111)
Creatinine, Ser: 1.65 mg/dL — ABNORMAL HIGH (ref 0.61–1.24)
GFR, Estimated: 46 mL/min — ABNORMAL LOW (ref >60)
Glucose, Bld: 112 mg/dL — ABNORMAL HIGH (ref 70–99)
Potassium: 4.2 mmol/L (ref 3.5–5.1)
Sodium: 137 mmol/L (ref 135–145)

## 2021-09-21 LAB — CBC
HCT: 21.7 % — ABNORMAL LOW (ref 39.0–52.0)
Hemoglobin: 7 g/dL — ABNORMAL LOW (ref 13.0–17.0)
MCH: 28.5 pg (ref 26.0–34.0)
MCHC: 32.3 g/dL (ref 30.0–36.0)
MCV: 88.2 fL (ref 80.0–100.0)
Platelets: 127 10*3/uL — ABNORMAL LOW (ref 150–400)
RBC: 2.46 MIL/uL — ABNORMAL LOW (ref 4.22–5.81)
RDW: 14.3 % (ref 11.5–15.5)
WBC: 4.1 10*3/uL (ref 4.0–10.5)
nRBC: 0 % (ref 0.0–0.2)

## 2021-09-21 LAB — GLUCOSE, CAPILLARY
Glucose-Capillary: 129 mg/dL — ABNORMAL HIGH (ref 70–99)
Glucose-Capillary: 160 mg/dL — ABNORMAL HIGH (ref 70–99)
Glucose-Capillary: 82 mg/dL (ref 70–99)

## 2021-09-21 LAB — PREPARE RBC (CROSSMATCH)

## 2021-09-21 LAB — HIV ANTIBODY (ROUTINE TESTING W REFLEX): HIV Screen 4th Generation wRfx: NONREACTIVE

## 2021-09-21 MED ORDER — LISINOPRIL 5 MG PO TABS: 2.5000 mg | ORAL_TABLET | Freq: Every day | ORAL | Status: DC

## 2021-09-21 MED ORDER — LEVEMIR FLEXTOUCH 100 UNIT/ML ~~LOC~~ SOPN: 15.0000 [IU] | PEN_INJECTOR | Freq: Every day | SUBCUTANEOUS | Status: DC

## 2021-09-21 MED ORDER — VITAMIN C 500 MG PO CAPS: 1000.0000 mg | ORAL_CAPSULE | Freq: Every day | ORAL | 1 refills | Status: DC

## 2021-09-21 MED ORDER — SODIUM CHLORIDE 0.9% IV SOLUTION: Freq: Once | INTRAVENOUS | Status: AC

## 2021-09-21 MED ORDER — HYDROCHLOROTHIAZIDE 25 MG PO TABS: 12.5000 mg | ORAL_TABLET | Freq: Every day | ORAL | 1 refills | Status: DC

## 2021-09-21 MED ORDER — PANTOPRAZOLE SODIUM 40 MG PO TBEC: 40.0000 mg | DELAYED_RELEASE_TABLET | Freq: Two times a day (BID) | ORAL | 3 refills | Status: DC

## 2021-09-21 NOTE — Progress Notes (Signed)
Patient and spouse given discharge instructions, patient verbally expressed understanding of instructions. Patient IV and cardiac monitor removed, patient given all belongings, vitals signs stable, patient ambulated with spouse accompanied by staff to exit the unit. ?

## 2021-09-21 NOTE — TOC Progression Note (Signed)
?  Transition of Care (TOC) Screening Note ? ? ?Patient Details  ?Name: Austin Morgan ?Date of Birth: 03-Aug-1955 ? ? ?Transition of Care (TOC) CM/SW Contact:    ?Boneta Lucks, RN ?Phone Number: ?09/21/2021, 2:00 PM ? ? ? ?Transition of Care Department [facility]) has reviewed patient and no TOC needs have been identified at this time. We will continue to monitor patient advancement through interdisciplinary progression rounds. If new patient transition needs arise, please place a TOC consult. ? ? ? ?  ?Barriers to Discharge: Continued Medical Work up ?  ? ? ?

## 2021-09-21 NOTE — Telephone Encounter (Signed)
Nurses ?I tried calling the patient ?I also went over to the hospital but the staff told me I just missed him ?Very important for [place] to see him on Monday with myself or Docia Barrier ?We will need to check a fingerstick hemoglobin ?I will also want to talk with him further about the anemia and further evaluation of this ?You have my permission to place him into an open slot with me on Monday or with Docia Barrier ?Thank you ? ?

## 2021-09-21 NOTE — Care Management Obs Status (Signed)
MEDICARE OBSERVATION STATUS NOTIFICATION ? ? ?Patient Details  ?Name: Austin Morgan ?MRN: [id number] ?Date of Birth: 06-29-1955 ? ? ?Medicare Observation Status Notification Given:  Yes ? ? ? ?Iona Beard, LCSWA ?09/21/2021, 9:39 AM ?

## 2021-09-21 NOTE — Telephone Encounter (Signed)
Nurses ?Please go ahead and set him up for office visit with myself we will need to repeat lab work on Monday this is a hospital follow-up ?Please inform the patient when he does follow-up we will be discussing setting him up for further consultation with possibly GI as well as hematology. ? ?Please see my chart message ?

## 2021-09-21 NOTE — Discharge Summary (Signed)
?Physician Discharge Summary ?  ?Patient: Austin Morgan MRN: [id number] DOB: Dec 18, 1955  ?Admit date:     09/20/2021  ?Discharge date: 09/21/21  ?Discharge Physician: Barton Dubois  ? ?PCP: Kathyrn Drown, MD  ? ?Recommendations at discharge:  ?Repeat CBC to follow hemoglobin trend ?Repeat basic metabolic panel to follow electrolytes and renal function ?Reassess blood pressure and adjust antihypertensive treatment as needed. ?Make sure patient has follow-up with gastroenterology service as instructed and also will follow up with/establish care with hematology for further evaluation and management of what appears to be bone marrow suppression anemia. ? ?Discharge Diagnoses: ?Principal Problem: ?  Symptomatic anemia ?Active Problems: ?  Diabetes mellitus without complication ([place]) ?  Dyslipidemia (high LDL; low HDL) ?  Hypertension ?  Anxiety and depression ?  Alcohol abuse ?  Restless leg syndrome ?  GERD (gastroesophageal reflux disease) ?  Hepatic cirrhosis ([place]) ?  Acute kidney injury superimposed on chronic kidney disease ([place]) ? ?Hospital Course: ?Austin Morgan is a 66 y.o. male with medical history significant of Hepatic cirrhosis, history of alcohol abuse (currently in complete abstinence for the last 8-9 months), hypertension, mixed hyperlipidemia, type 2 diabetes mellitus with nephropathy (chronic kidney disease a stage IIIa), depression/anxiety, restless leg syndrome and prior history of tobacco abuse (patient has quit about 5 years ago); presented to the hospital secondary to increased fatigue, weakness and short winded sensation with activity.  He was seen by PCP and found to have a hemoglobin of 5.9 with instructions to come to the hospital for further evaluation, management and transfusion. ?Patient reports no hematemesis, no hematochezia, no melena, no chest pain, no palpitations, no focal weakness, no dysuria. ?He expressed approximately 7-10 days of intermittent hemoptysis and bleeding out of his  gums. ?  ?In the ED hemoglobin level 5.6, acute kidney injury with creatinine of 1.8 (baseline in the 1.3--1.4 range), no acute ischemic changes appreciated on telemetry while in the ED.  IV line was placed, patient was typed and crossed and 2 units PRBCs were ordered.  TR[facility] contacted for patient to be placed in the hospital for further evaluation and management. ? ?Assessment and Plan: ?* Symptomatic anemia ?- In the setting of what appears to be bone marrow suppression from chronic alcohol abuse. ?-Patient reported no vomiting or noticing any melena, hematochezia or bright red blood per rectum. ?-Case discussed with gastroenterology service who recommended transfusion to stabilize hemoglobin and outpatient follow-up for repeat endoscopic screening evaluation. ?-Patient reported no further episodes of epistaxis and bleeding out of his gums. ?-He also denies hematuria. ?-3 units of PRBCs were provided during this hospitalization. ?-At time of admission hemoglobin 5.6. ?-Patient reported improvement in his abdominal symptoms of anemia and expressed no chest pain or shortness of breath at time of discharge. ? ?Acute kidney injury superimposed on chronic kidney disease ([place]) ?-stage 3a at baseline ?-AKI in the setting of anemia and decrease perfusion while continue using nephrotoxic agents. ?-After holding nephrotoxic agents and providing transfusion patient renal function improved and ?[place]iley return back to his baseline. ?-Will recommend repeat basic metabolic panel to follow stability and trend. ?-Lisinopril and HCTZ dosages were adjusted at time of discharge. ? ?Hepatic cirrhosis ([place]) ?-Overall stable LFTs appreciated ?-Continue outpatient follow-up with GI service ?-Patient has been encouraged to continue complete alcohol abstinence. ? ?GERD (gastroesophageal reflux disease) ?-Continue PPI twice a day ?-Patient reporting no hematemesis, hematochezia or melena. ?-Advised to stop the use of NSAIDs. ? ?Restless leg  syndrome ?-Continue the use of Mirapex. ? ?  Alcohol abuse ?-Patient reports complete abstinence for the last 8 months at least ?-No acute withdrawal event reported. ?-Patient has been congratulated and encouraged to maintain complete abstinence from alcohol. ? ?Anxiety and depression ?-Overall mood is a stable ?-No suicidal ideation or hallucinations. ?-Continue as needed trazodone. ? ?Hypertension ?-Overall stable and within normal limits ?-In the setting of low hemoglobin and acute kidney injury his antihypertensive agents were initially held and subsequently resumed at adjusted dose. ?-Patient advised to maintain adequate hydration and to follow low-sodium diet. ?-Reassess blood pressure and adjust management as needed at follow-up visit. ? ? ?Dyslipidemia (high LDL; low HDL) ?-Resume fish oil as an outpatient ?-Patient advised to follow heart healthy diet ?-Recent lipid panel demonstrating stable levels. ? ?Diabetes mellitus without complication ([place]) ?-Recent A1c demonstrating labile 6.0 ?-Resume home hypoglycemic agents and outpatient follow-up with PCP. ?-Advised to follow multifactorial hydrate diet. ? ? ? ?Consultants: GI service curbside. ?Procedures performed: See below for x-ray reports. ?Disposition: Home ? ?Diet recommendation:  ?Discharge Diet Orders (From admission, onward)  ? ?  Start     Ordered  ? 09/21/21 0000  Diet - low sodium heart healthy       ? 09/21/21 1548  ? ?  ?  ? ?  ? ? ?DISCHARGE MEDICATION: ?Allergies as of 09/21/2021   ? ?   Reactions  ? Codeine   ? REACTION: nauaea/vomitig  ? ?  ? ?  ?Medication List  ?  ? ?STOP taking these medications   ? ?cyclobenzaprine 10 MG tablet ?Commonly known as: FLEXERIL ?  ?naproxen sodium 220 MG tablet ?Commonly known as: ALEVE ?  ?ondansetron 8 MG tablet ?Commonly known as: Zofran ?  ?tadalafil 20 MG tablet ?Commonly known as: CIALIS ?  ? ?  ? ?TAKE these medications   ? ?blood glucose meter kit and supplies Kit ?Dispense based on patient and insurance  preference. Use up to BID as directed. ?  ?fish oil-omega-3 fatty acids 1000 MG capsule ?Take 1 g by mouth 2 (two) times daily. ?  ?glipiZIDE 5 MG tablet ?Commonly known as: GLUCOTROL ?Take one tablet po BID ?What changed:  ?how much to take ?how to take this ?when to take this ?additional instructions ?  ?hydrochlorothiazide 25 MG tablet ?Commonly known as: HYDRODIURIL ?Take 0.5 tablets (12.5 mg total) by mouth daily. ?Start taking on: September 23, 2021 ?What changed:  ?how much to take ?These instructions start on September 23, 2021. If you are unsure what to do until then, ask your doctor or other care provider. ?  ?Levemir FlexTouch 100 UNIT/ML FlexPen ?Generic drug: insulin detemir ?Inject 15 Units into the skin daily. 8 units qpm may titrate up to 40 units under direction of physician ?What changed:  ?how much to take ?how to take this ?when to take this ?  ?lisinopril 5 MG tablet ?Commonly known as: ZESTRIL ?Take 0.5 tablets (2.5 mg total) by mouth daily. ?Start taking on: September 23, 2021 ?What changed:  ?how much to take ?These instructions start on September 23, 2021. If you are unsure what to do until then, ask your doctor or other care provider. ?  ?metFORMIN 500 MG tablet ?Commonly known as: GLUCOPHAGE ?Take one tablet po bid ?What changed:  ?how much to take ?how to take this ?when to take this ?additional instructions ?  ?multivitamin with minerals tablet ?Take 1 tablet by mouth daily. ?  ?naltrexone 50 MG tablet ?Commonly known as: DEPADE ?TAKE ONE TABLET BY MOUTH ONCE DAILY. ?  ?  OneTouch Ultra test strip ?Generic drug: glucose blood ?USE AS DIRECTED TO TEST BLOOD GLUCOSE TWICE DAILY. ?  ?pantoprazole 40 MG tablet ?Commonly known as: PROTONIX ?Take 1 tablet (40 mg total) by mouth 2 (two) times daily. ?What changed: when to take this ?  ?pramipexole 0.25 MG tablet ?Commonly known as: MIRAPEX ?TAKE 1 TABLET BY MOUTH NIGHTLY AT  LEAST  2  HOURS  BEFORE  BEDTIME  FOR  RESTLESS  LEGS ?What changed:  ?how much to  take ?how to take this ?when to take this ?  ?traZODone 100 MG tablet ?Commonly known as: DESYREL ?Take 1 tablet (100 mg total) by mouth at bedtime as needed. for sleep ?  ?Vitamin C 500 MG Caps ?Take 1,000 mg by

## 2021-09-22 ENCOUNTER — Telehealth: Payer: Self-pay | Admitting: Gastroenterology

## 2021-09-22 ENCOUNTER — Ambulatory Visit: Payer: Medicare HMO | Admitting: Gastroenterology

## 2021-09-22 ENCOUNTER — Telehealth: Payer: Self-pay

## 2021-09-22 ENCOUNTER — Encounter: Payer: Self-pay | Admitting: Gastroenterology

## 2021-09-22 VITALS — BP 122/70 | HR 78 | Temp 97.6°F | Ht 66.5 in | Wt 195.0 lb

## 2021-09-22 DIAGNOSIS — K7460 Unspecified cirrhosis of liver: Secondary | ICD-10-CM

## 2021-09-22 DIAGNOSIS — D649 Anemia, unspecified: Secondary | ICD-10-CM

## 2021-09-22 DIAGNOSIS — R609 Edema, unspecified: Secondary | ICD-10-CM | POA: Diagnosis not present

## 2021-09-22 LAB — TYPE AND SCREEN
ABO/RH(D): O POS
Antibody Screen: NEGATIVE
Unit division: 0
Unit division: 0
Unit division: 0

## 2021-09-22 LAB — BPAM RBC
Blood Product Expiration Date: 202305152359
Blood Product Expiration Date: 202305152359
Blood Product Expiration Date: 202305182359
ISSUE DATE / TIME: 202304121057
ISSUE DATE / TIME: 202304121348
ISSUE DATE / TIME: 202304130851
Unit Type and Rh: 5100
Unit Type and Rh: 5100
Unit Type and Rh: 5100

## 2021-09-22 NOTE — Telephone Encounter (Signed)
Tammy: please call [facility] and add on AFP tumor marker, Dx: cirrhosis. ? ? ?[facility] clinical pool: ? ?Patient seen in office today. Discussed with patient and wife, they are interested in consultation with hematology for acute on chronic anemia, rule out hematologic source. Thrombocytopenia likely due to cirrhosis. Please arrange.  ? ?He also needs EGD/colonoscopy with RMR. ASA 3. Propofol. If not soon enough, may have to discuss getting help from Dr. Abbey Chatters. Dx: cirrhosis, screen for esophageal varices, profound anemia, h/o adenomatous colon polyps.  ? ?Day of prep: glipizide '5mg'$  am only, levemir 1/2 dose, metformin '500mg'$  am only ?AM of TCS: hold above ?Prep: trylite with tap water enemas due to kidney disease.  ?

## 2021-09-22 NOTE — Telephone Encounter (Signed)
Transition Care Management Follow-up Telephone Call ?Date of discharge and from where: 09/21/2021 [facility] ?How have you been since you were released from the hospital? Pt states he is doing well, currently at [place] up medications. ?Any questions or concerns? No ? ?Items Reviewed: ?Did the pt receive and understand the discharge instructions provided? Yes  ?Medications obtained and verified? Yes  ?Other? No  ?Any new allergies since your discharge? No  ?Dietary orders reviewed? Yes ?Do you have support at home? Yes  ? ?Home Care and Equipment/Supplies: ?Were home health services ordered? no ?If so, what is the name of the agency? N/A  ?Has the agency set up a time to come to the patient's home? not applicable ?Were any new equipment or medical supplies ordered?  No ?What is the name of the medical supply agency? N/A ?Were you able to get the supplies/equipment? not applicable ?Do you have any questions related to the use of the equipment or supplies? No ? ?Functional Questionnaire: (I = Independent and D = Dependent) ?ADLs: I ? ?Bathing/Dressing- I ? ?Meal Prep- I ? ?Eating- I ? ?Maintaining continence- I ? ?Transferring/Ambulation- I ? ?Managing Meds- I ? ?Follow up appointments reviewed: ? ?PCP Hospital f/u appt confirmed? Yes  Scheduled to see Dr. Wolfgang Phoenix on 09/25/21 @ 8:40. ?[facility] f/u appt confirmed? Yes  Scheduled to see Hematology at [facility] in 4 weeks. ?Are transportation arrangements needed? No  ?If their condition worsens, is the pt aware to call PCP or go to the Emergency Dept.? Yes ?Was the patient provided with contact information for the PCP's office or ED? Yes ?Was to pt encouraged to call back with questions or concerns? Yes ? ?

## 2021-09-22 NOTE — Telephone Encounter (Signed)
Pt has been set up for Monday 09/25/21 for office appt ?

## 2021-09-22 NOTE — Progress Notes (Signed)
? ? ? ?GI Office Note   ? ?Referring Provider: Kathyrn Drown, MD ?Primary Care Physician:  Austin Drown, MD  ?Primary Gastroenterologist: Austin Cornea, MD ? ? ?Chief Complaint  ? ?Chief Complaint  ?Patient presents with  ? Follow-up  ? ? ?History of Present Illness  ? ?Austin Morgan is a 66 y.o. male presenting for follow-up.  Patient last seen in November 2022.  He has a history of GERD, alcohol abuse in remission, anemia, cirrhosis. ? ?EGD/colonoscopy 01/2020. He had 9 colon polyps removed, multiple tubular adenomas. Next colonoscopy planned in 01/2023. His EGD was normal. ? ?Ultrasound with elastography June 2022 for hepatomegaly, noted to have coarsened hepatic echotexture with subtle contour nodularity, perihepatic ascites, spleen measuring 14 cm, median kPA 51.6.  Aneurysm dilation of the proximal aorta measuring 3.2 cm.  ? ?Historically he has had mildly elevated alk phos even when AST/ALT have been normal. Previous AMA negative, GGT 570 in 05/2020. He has history of mildly low H/H dating back to 2021 at least. His MCV has ranged from 105 initially down to 92. His B12/folate have been normal. His iron, ferritin, TIBC were normal in 04/2021. Last year, we first noted mild thrombocytopenia, has been stable. ? ?Patient discharged yesterday after 23 hour observation for symptomatic anemia and outpatient Hgb of 5.9.  Patient initially received 2 units of packed red blood cells with hemoglobin rising to 7.0.  He received 3rd unit of packed red blood cells before he left the hospital yesterday.  He presents with his wife today who is a NP with outpatient palliative services.  Was seen by his PCP recently with several complaints including bleeding gums, pedal edema.  Labs and echocardiogram ordered (noted to have a heart murmur).  Patient had labs done on April 11 showing hemoglobin of 5.9, platelets 123,000, MCV 92, BNP 250.  He was directed to the ED as outlined. ? ?Patient reports that he has noted some darker  stools a few months ago but those cleared up.  He denies black or bloody stools.  He has had some nosebleeds off and on, seem to be worse during the colder months.  More recently has had about 4 or 5 "heavier nosebleeds" but according to he and his wife, nothing that was very alarming. Noted on a few occasions when he got up in the morning, as he cleared his throat and coughed a little bit that he would see some blood as he coughed up.  He felt like it was from nosebleed during the night.  Gums bleed intermittently.  He denies any nausea since he quit drinking in October.  Has had no abdominal pain.  His appetite is good.  Heartburn is well controlled.  He has a bowel movement every day.  Denies any black or bloody stools.  He complains of new edema in the lower extremities.  Noted for about 1 to 2 weeks, not progressive.  Seems to get better overnight but quickly redevelops after he is gotten up every morning.  States he has an echocardiogram pending for a new heart murmur.  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  He has used some Aleve intermittently for hand pain, states no more than about 10 doses over the past several weeks.  He has since stopped this when he found out he was significantly anemic.  He notes that he also has quit alcohol since October 2022, stating that birth of his most recent grandson, Austin Morgan, has given  him the reason to do this. ?  ? ?Medications  ? ?Current Outpatient Medications  ?Medication Sig Dispense Refill  ? Ascorbic Acid (VITAMIN C) 500 MG CAPS Take 1,000 mg by mouth daily. 30 capsule 1  ? blood glucose meter kit and supplies KIT Dispense based on patient and insurance preference. Use up to BID as directed. 1 each 0  ? fish oil-omega-3 fatty acids 1000 MG capsule Take 1 g by mouth 2 (two) times daily.     ? glipiZIDE (GLUCOTROL) 5 MG tablet Take one tablet po BID (Patient taking differently: Take 5 mg by mouth 2 (two) times daily before a meal.) 180  tablet 1  ? [START ON 09/23/2021] hydrochlorothiazide (HYDRODIURIL) 25 MG tablet Take 0.5 tablets (12.5 mg total) by mouth daily. 30 tablet 1  ? insulin detemir (LEVEMIR FLEXTOUCH) 100 UNIT/ML FlexPen Inject 15 Units into the skin daily. 8 units qpm may titrate up to 40 units under direction of physician    ? [START ON 09/23/2021] lisinopril (ZESTRIL) 5 MG tablet Take 0.5 tablets (2.5 mg total) by mouth daily.    ? metFORMIN (GLUCOPHAGE) 500 MG tablet Take one tablet po bid (Patient taking differently: Take 500 mg by mouth 2 (two) times daily with a meal.) 180 tablet 3  ? Multiple Vitamins-Minerals (MULTIVITAMIN WITH MINERALS) tablet Take 1 tablet by mouth daily.    ? naltrexone (DEPADE) 50 MG tablet TAKE ONE TABLET BY MOUTH ONCE DAILY. 90 tablet 1  ? ONETOUCH ULTRA test strip USE AS DIRECTED TO TEST BLOOD GLUCOSE TWICE DAILY. 50 each 1  ? pantoprazole (PROTONIX) 40 MG tablet Take 1 tablet (40 mg total) by mouth 2 (two) times daily. 60 tablet 3  ? pramipexole (MIRAPEX) 0.25 MG tablet TAKE 1 TABLET BY MOUTH NIGHTLY AT  LEAST  2  HOURS  BEFORE  BEDTIME  FOR  RESTLESS  LEGS (Patient taking differently: Take 0.5 mg by mouth at bedtime. TAKE 1 TABLET BY MOUTH NIGHTLY AT  LEAST  2  HOURS  BEFORE  BEDTIME  FOR  RESTLESS  LEGS) 30 tablet 5  ? traZODone (DESYREL) 100 MG tablet Take 1 tablet (100 mg total) by mouth at bedtime as needed. for sleep 90 tablet 3  ? ?No current facility-administered medications for this visit.  ? ? ?Allergies  ? ?Allergies as of 09/22/2021 - Review Complete 09/22/2021  ?Allergen Reaction Noted  ? Codeine  11/26/2008  ? ?  ?Past Medical History  ? ?Past Medical History:  ?Diagnosis Date  ? Arthritis   ? Diabetes mellitus without complication ([place])   ? ED (erectile dysfunction)   ? Gout   ? H/O ETOH abuse   ? Hyperlipidemia   ? Hypertension   ? Hypertriglyceridemia   ? PONV (postoperative nausea and vomiting)   ? Sleep apnea   ? ? ?Past Surgical History  ? ?Past Surgical History:  ?Procedure  Laterality Date  ? CARPAL TUNNEL RELEASE Bilateral   ? COLONOSCOPY  2010  ? Dr. Delfin Morgan: mild diverticulosis, next colonoscopy 2020  ? COLONOSCOPY WITH PROPOFOL N/A 01/21/2020  ? Rourk: 9 polyps removed, multiple tubular adenomas.  Next colonoscopy in 3 years.  ? ESOPHAGOGASTRODUODENOSCOPY (EGD) WITH PROPOFOL N/A 01/21/2020  ? Rourk: normal  ? NECK SURGERY    ? c4  ? POLYPECTOMY  01/21/2020  ? Procedure: POLYPECTOMY;  Surgeon: [name], MD;  Location: [facility];  Service: Endoscopy;;  colon  ? ? ?Past Family History  ? ?Family History  ?Problem  Relation Age of Onset  ? Heart attack Father   ? Colon cancer Neg Hx   ? ? ?Past Social History  ? ?Social History  ? ?Socioeconomic History  ? Marital status: Married  ?  Spouse name: Not on file  ? Number of children: Not on file  ? Years of education: Not on file  ? Highest education level: Not on file  ?Occupational History  ? Not on file  ?Tobacco Use  ? Smoking status: Former  ?  Packs/day: 1.00  ?  Years: 36.00  ?  Pack years: 36.00  ?  Types: Cigarettes  ?  Quit date: 03/11/2016  ?  Years since quitting: 5.5  ? Smokeless tobacco: Never  ?Vaping Use  ? Vaping Use: Never used  ?Substance and Sexual Activity  ? Alcohol use: Not Currently  ?  Comment: drinks a fifth of liquor per week; inpatient rehab 08/2019. denied 10/14/19. No etoh since 03/2021 (as of 09/22/21)  ? Drug use: No  ? Sexual activity: Yes  ?Other Topics Concern  ? Not on file  ?Social History Narrative  ? Wife, Dominica Severin NP.  ? ?Social Determinants of Health  ? ?Financial Resource Strain: Not on file  ?Food Insecurity: Not on file  ?Transportation Needs: Not on file  ?Physical Activity: Not on file  ?Stress: Not on file  ?Social Connections: Not on file  ?Intimate Partner Violence: Not on file  ? ? ?Review of Systems  ? ?General: Negative for anorexia, weight loss, fever, chills, fatigue, weakness. ?ENT: Negative for hoarseness, difficulty swallowing, nasal congestion. ?CV: Negative for chest  pain, angina, palpitations, dyspnea on exertion, peripheral edema.  ?Respiratory: Negative for dyspnea at rest, sputum, wheezing. See hpi ?GI: See history of present illness. ?GU:  Negative for dysuria, hematuria

## 2021-09-22 NOTE — H&P (View-Only) (Signed)
? ? ? ?GI Office Note   ? ?Referring Provider: Kathyrn Drown, MD ?Primary Care Physician:  Kathyrn Drown, MD  ?Primary Gastroenterologist: Garfield Cornea, MD ? ? ?Chief Complaint  ? ?Chief Complaint  ?Patient presents with  ? Follow-up  ? ? ?History of Present Illness  ? ?Austin Morgan is a 66 y.o. male presenting for follow-up.  Patient last seen in November 2022.  He has a history of GERD, alcohol abuse in remission, anemia, cirrhosis. ? ?EGD/colonoscopy 01/2020. He had 9 colon polyps removed, multiple tubular adenomas. Next colonoscopy planned in 01/2023. His EGD was normal. ? ?Ultrasound with elastography June 2022 for hepatomegaly, noted to have coarsened hepatic echotexture with subtle contour nodularity, perihepatic ascites, spleen measuring 14 cm, median kPA 51.6.  Aneurysm dilation of the proximal aorta measuring 3.2 cm.  ? ?Historically he has had mildly elevated alk phos even when AST/ALT have been normal. Previous AMA negative, GGT 570 in 05/2020. He has history of mildly low H/H dating back to 2021 at least. His MCV has ranged from 105 initially down to 92. His B12/folate have been normal. His iron, ferritin, TIBC were normal in 04/2021. Last year, we first noted mild thrombocytopenia, has been stable. ? ?Patient discharged yesterday after 23 hour observation for symptomatic anemia and outpatient Hgb of 5.9.  Patient initially received 2 units of packed red blood cells with hemoglobin rising to 7.0.  He received 3rd unit of packed red blood cells before he left the hospital yesterday.  He presents with his wife today who is a NP with outpatient palliative services.  Was seen by his PCP recently with several complaints including bleeding gums, pedal edema.  Labs and echocardiogram ordered (noted to have a heart murmur).  Patient had labs done on April 11 showing hemoglobin of 5.9, platelets 123,000, MCV 92, BNP 250.  He was directed to the ED as outlined. ? ?Patient reports that he has noted some darker  stools a few months ago but those cleared up.  He denies black or bloody stools.  He has had some nosebleeds off and on, seem to be worse during the colder months.  More recently has had about 4 or 5 "heavier nosebleeds" but according to he and his wife, nothing that was very alarming. Noted on a few occasions when he got up in the morning, as he cleared his throat and coughed a little bit that he would see some blood as he coughed up.  He felt like it was from nosebleed during the night.  Gums bleed intermittently.  He denies any nausea since he quit drinking in October.  Has had no abdominal pain.  His appetite is good.  Heartburn is well controlled.  He has a bowel movement every day.  Denies any black or bloody stools.  He complains of new edema in the lower extremities.  Noted for about 1 to 2 weeks, not progressive.  Seems to get better overnight but quickly redevelops after he is gotten up every morning.  States he has an echocardiogram pending for a new heart murmur.  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  He has used some Aleve intermittently for hand pain, states no more than about 10 doses over the past several weeks.  He has since stopped this when he found out he was significantly anemic.  He notes that he also has quit alcohol since October 2022, stating that birth of his most recent grandson, Austin Morgan, has given  him the reason to do this. ?  ? ?Medications  ? ?Current Outpatient Medications  ?Medication Sig Dispense Refill  ? Ascorbic Acid (VITAMIN C) 500 MG CAPS Take 1,000 mg by mouth daily. 30 capsule 1  ? blood glucose meter kit and supplies KIT Dispense based on patient and insurance preference. Use up to BID as directed. 1 each 0  ? fish oil-omega-3 fatty acids 1000 MG capsule Take 1 g by mouth 2 (two) times daily.     ? glipiZIDE (GLUCOTROL) 5 MG tablet Take one tablet po BID (Patient taking differently: Take 5 mg by mouth 2 (two) times daily before a meal.) 180  tablet 1  ? [START ON 09/23/2021] hydrochlorothiazide (HYDRODIURIL) 25 MG tablet Take 0.5 tablets (12.5 mg total) by mouth daily. 30 tablet 1  ? insulin detemir (LEVEMIR FLEXTOUCH) 100 UNIT/ML FlexPen Inject 15 Units into the skin daily. 8 units qpm may titrate up to 40 units under direction of physician    ? [START ON 09/23/2021] lisinopril (ZESTRIL) 5 MG tablet Take 0.5 tablets (2.5 mg total) by mouth daily.    ? metFORMIN (GLUCOPHAGE) 500 MG tablet Take one tablet po bid (Patient taking differently: Take 500 mg by mouth 2 (two) times daily with a meal.) 180 tablet 3  ? Multiple Vitamins-Minerals (MULTIVITAMIN WITH MINERALS) tablet Take 1 tablet by mouth daily.    ? naltrexone (DEPADE) 50 MG tablet TAKE ONE TABLET BY MOUTH ONCE DAILY. 90 tablet 1  ? ONETOUCH ULTRA test strip USE AS DIRECTED TO TEST BLOOD GLUCOSE TWICE DAILY. 50 each 1  ? pantoprazole (PROTONIX) 40 MG tablet Take 1 tablet (40 mg total) by mouth 2 (two) times daily. 60 tablet 3  ? pramipexole (MIRAPEX) 0.25 MG tablet TAKE 1 TABLET BY MOUTH NIGHTLY AT  LEAST  2  HOURS  BEFORE  BEDTIME  FOR  RESTLESS  LEGS (Patient taking differently: Take 0.5 mg by mouth at bedtime. TAKE 1 TABLET BY MOUTH NIGHTLY AT  LEAST  2  HOURS  BEFORE  BEDTIME  FOR  RESTLESS  LEGS) 30 tablet 5  ? traZODone (DESYREL) 100 MG tablet Take 1 tablet (100 mg total) by mouth at bedtime as needed. for sleep 90 tablet 3  ? ?No current facility-administered medications for this visit.  ? ? ?Allergies  ? ?Allergies as of 09/22/2021 - Review Complete 09/22/2021  ?Allergen Reaction Noted  ? Codeine  11/26/2008  ? ?  ?Past Medical History  ? ?Past Medical History:  ?Diagnosis Date  ? Arthritis   ? Diabetes mellitus without complication ([place])   ? ED (erectile dysfunction)   ? Gout   ? H/O ETOH abuse   ? Hyperlipidemia   ? Hypertension   ? Hypertriglyceridemia   ? PONV (postoperative nausea and vomiting)   ? Sleep apnea   ? ? ?Past Surgical History  ? ?Past Surgical History:  ?Procedure  Laterality Date  ? CARPAL TUNNEL RELEASE Bilateral   ? COLONOSCOPY  2010  ? Dr. Delfin Edis: mild diverticulosis, next colonoscopy 2020  ? COLONOSCOPY WITH PROPOFOL N/A 01/21/2020  ? Rourk: 9 polyps removed, multiple tubular adenomas.  Next colonoscopy in 3 years.  ? ESOPHAGOGASTRODUODENOSCOPY (EGD) WITH PROPOFOL N/A 01/21/2020  ? Rourk: normal  ? NECK SURGERY    ? c4  ? POLYPECTOMY  01/21/2020  ? Procedure: POLYPECTOMY;  Surgeon: [name], MD;  Location: [facility];  Service: Endoscopy;;  colon  ? ? ?Past Family History  ? ?Family History  ?Problem  Relation Age of Onset  ? Heart attack Father   ? Colon cancer Neg Hx   ? ? ?Past Social History  ? ?Social History  ? ?Socioeconomic History  ? Marital status: Married  ?  Spouse name: Not on file  ? Number of children: Not on file  ? Years of education: Not on file  ? Highest education level: Not on file  ?Occupational History  ? Not on file  ?Tobacco Use  ? Smoking status: Former  ?  Packs/day: 1.00  ?  Years: 36.00  ?  Pack years: 36.00  ?  Types: Cigarettes  ?  Quit date: 03/11/2016  ?  Years since quitting: 5.5  ? Smokeless tobacco: Never  ?Vaping Use  ? Vaping Use: Never used  ?Substance and Sexual Activity  ? Alcohol use: Not Currently  ?  Comment: drinks a fifth of liquor per week; inpatient rehab 08/2019. denied 10/14/19. No etoh since 03/2021 (as of 09/22/21)  ? Drug use: No  ? Sexual activity: Yes  ?Other Topics Concern  ? Not on file  ?Social History Narrative  ? Wife, Dominica Severin NP.  ? ?Social Determinants of Health  ? ?Financial Resource Strain: Not on file  ?Food Insecurity: Not on file  ?Transportation Needs: Not on file  ?Physical Activity: Not on file  ?Stress: Not on file  ?Social Connections: Not on file  ?Intimate Partner Violence: Not on file  ? ? ?Review of Systems  ? ?General: Negative for anorexia, weight loss, fever, chills, fatigue, weakness. ?ENT: Negative for hoarseness, difficulty swallowing, nasal congestion. ?CV: Negative for chest  pain, angina, palpitations, dyspnea on exertion, peripheral edema.  ?Respiratory: Negative for dyspnea at rest, sputum, wheezing. See hpi ?GI: See history of present illness. ?GU:  Negative for dysuria, hematuria

## 2021-09-22 NOTE — Patient Instructions (Signed)
Congratulations on your sobriety! Keep up the good work! ?Please go to [facility] for blood work today. ?We will work on a referral to see the blood specialist and to schedule you for an upper endoscopy and colonoscopy.  ?If you have black or bloody stools, increased fatigue, lightheadedness, or shortness of breath, please go to the emergency department as these symptoms can be a sign of significant anemia and bleeding.  ?

## 2021-09-23 LAB — CBC WITH DIFFERENTIAL/PLATELET
Basophils Absolute: 0 10*3/uL (ref 0.0–0.2)
Basophils Absolute: 0 10*3/uL (ref 0.0–0.2)
Basos: 0 %
Basos: 1 %
EOS (ABSOLUTE): 0.2 10*3/uL (ref 0.0–0.4)
EOS (ABSOLUTE): 0.3 10*3/uL (ref 0.0–0.4)
Eos: 6 %
Eos: 7 %
Hematocrit: 18.4 % — ABNORMAL LOW (ref 37.5–51.0)
Hematocrit: 25.1 % — ABNORMAL LOW (ref 37.5–51.0)
Hemoglobin: 5.9 g/dL — CL (ref 13.0–17.7)
Hemoglobin: 8.7 g/dL — ABNORMAL LOW (ref 13.0–17.7)
Immature Grans (Abs): 0 10*3/uL (ref 0.0–0.1)
Immature Grans (Abs): 0 10*3/uL (ref 0.0–0.1)
Immature Granulocytes: 0 %
Immature Granulocytes: 0 %
Lymphocytes Absolute: 0.9 10*3/uL (ref 0.7–3.1)
Lymphocytes Absolute: 1 10*3/uL (ref 0.7–3.1)
Lymphs: 25 %
Lymphs: 23 %
MCH: 29.4 pg (ref 26.6–33.0)
MCH: 29.7 pg (ref 26.6–33.0)
MCHC: 32.1 g/dL (ref 31.5–35.7)
MCHC: 34.7 g/dL (ref 31.5–35.7)
MCV: 92 fL (ref 79–97)
MCV: 86 fL (ref 79–97)
Monocytes Absolute: 0.3 10*3/uL (ref 0.1–0.9)
Monocytes Absolute: 0.4 10*3/uL (ref 0.1–0.9)
Monocytes: 8 %
Monocytes: 10 %
Neutrophils Absolute: 2.2 10*3/uL (ref 1.4–7.0)
Neutrophils Absolute: 2.5 10*3/uL (ref 1.4–7.0)
Neutrophils: 61 %
Neutrophils: 59 %
Platelets: 123 10*3/uL — ABNORMAL LOW (ref 150–450)
Platelets: 124 10*3/uL — ABNORMAL LOW (ref 150–450)
RBC: 2.01 x10E6/uL — CL (ref 4.14–5.80)
RBC: 2.93 x10E6/uL — ABNORMAL LOW (ref 4.14–5.80)
RDW: 12.8 % (ref 11.6–15.4)
RDW: 13.5 % (ref 11.6–15.4)
WBC: 3.5 10*3/uL (ref 3.4–10.8)
WBC: 4.2 10*3/uL (ref 3.4–10.8)

## 2021-09-23 LAB — BASIC METABOLIC PANEL
BUN/Creatinine Ratio: 17 (ref 10–24)
BUN: 23 mg/dL (ref 8–27)
CO2: 21 mmol/L (ref 20–29)
Calcium: 9.1 mg/dL (ref 8.6–10.2)
Chloride: 107 mmol/L — ABNORMAL HIGH (ref 96–106)
Creatinine, Ser: 1.33 mg/dL — ABNORMAL HIGH (ref 0.76–1.27)
Glucose: 85 mg/dL (ref 70–99)
Potassium: 4.2 mmol/L (ref 3.5–5.2)
Sodium: 139 mmol/L (ref 134–144)
eGFR: 59 mL/min/{1.73_m2} — ABNORMAL LOW (ref >59)

## 2021-09-23 LAB — LIPID PANEL
Chol/HDL Ratio: 2.9 ratio (ref 0.0–5.0)
Cholesterol, Total: 114 mg/dL (ref 100–199)
HDL: 40 mg/dL (ref >39)
LDL Chol Calc (NIH): 59 mg/dL (ref 0–99)
Triglycerides: 75 mg/dL (ref 0–149)
VLDL Cholesterol Cal: 15 mg/dL (ref 5–40)

## 2021-09-23 LAB — MICROALBUMIN / CREATININE URINE RATIO
Creatinine, Urine: 144.7 mg/dL
Microalb/Creat Ratio: 4 mg/g creat (ref 0–29)
Microalbumin, Urine: 6.5 ug/mL

## 2021-09-23 LAB — HEMOGLOBIN A1C
Est. average glucose Bld gHb Est-mCnc: 126 mg/dL
Hgb A1c MFr Bld: 6 % — ABNORMAL HIGH (ref 4.8–5.6)

## 2021-09-23 LAB — TSH+FREE T4
Free T4: 1.12 ng/dL (ref 0.82–1.77)
TSH: 3.29 u[IU]/mL (ref 0.450–4.500)

## 2021-09-23 LAB — BRAIN NATRIURETIC PEPTIDE: BNP: 250 pg/mL — ABNORMAL HIGH (ref 0.0–100.0)

## 2021-09-23 LAB — HEPATIC FUNCTION PANEL
ALT: 17 IU/L (ref 0–44)
AST: 25 IU/L (ref 0–40)
Albumin: 3.3 g/dL — ABNORMAL LOW (ref 3.8–4.8)
Alkaline Phosphatase: 143 IU/L — ABNORMAL HIGH (ref 44–121)
Bilirubin Total: 0.3 mg/dL (ref 0.0–1.2)
Bilirubin, Direct: 0.15 mg/dL (ref 0.00–0.40)
Total Protein: 5.9 g/dL — ABNORMAL LOW (ref 6.0–8.5)

## 2021-09-24 ENCOUNTER — Encounter: Payer: Self-pay | Admitting: Family Medicine

## 2021-09-24 DIAGNOSIS — D539 Nutritional anemia, unspecified: Secondary | ICD-10-CM

## 2021-09-25 ENCOUNTER — Encounter: Payer: Self-pay | Admitting: *Deleted

## 2021-09-25 ENCOUNTER — Other Ambulatory Visit: Payer: Self-pay

## 2021-09-25 ENCOUNTER — Other Ambulatory Visit (HOSPITAL_COMMUNITY)
Admission: RE | Admit: 2021-09-25 | Discharge: 2021-09-25 | Disposition: A | Discharge status: Home / Self Care | Payer: Medicare HMO | Source: Ambulatory Visit | Attending: Family Medicine | Admitting: Family Medicine

## 2021-09-25 ENCOUNTER — Telehealth: Payer: Self-pay | Admitting: Internal Medicine

## 2021-09-25 ENCOUNTER — Other Ambulatory Visit: Payer: Self-pay | Admitting: *Deleted

## 2021-09-25 ENCOUNTER — Encounter: Payer: Self-pay | Admitting: Internal Medicine

## 2021-09-25 ENCOUNTER — Ambulatory Visit (INDEPENDENT_AMBULATORY_CARE_PROVIDER_SITE_OTHER): Payer: Medicare HMO | Admitting: Family Medicine

## 2021-09-25 VITALS — BP 132/76 | HR 79 | Temp 97.6°F | Ht 66.5 in | Wt 193.2 lb

## 2021-09-25 DIAGNOSIS — D649 Anemia, unspecified: Secondary | ICD-10-CM

## 2021-09-25 DIAGNOSIS — N189 Chronic kidney disease, unspecified: Secondary | ICD-10-CM

## 2021-09-25 DIAGNOSIS — R600 Localized edema: Secondary | ICD-10-CM

## 2021-09-25 DIAGNOSIS — N179 Acute kidney failure, unspecified: Secondary | ICD-10-CM

## 2021-09-25 DIAGNOSIS — R195 Other fecal abnormalities: Secondary | ICD-10-CM | POA: Diagnosis not present

## 2021-09-25 LAB — COMPREHENSIVE METABOLIC PANEL
ALT: 19 U/L (ref 0–44)
AST: 31 U/L (ref 15–41)
Albumin: 3 g/dL — ABNORMAL LOW (ref 3.5–5.0)
Alkaline Phosphatase: 114 U/L (ref 38–126)
Anion gap: 8 (ref 5–15)
BUN: 20 mg/dL (ref 8–23)
CO2: 21 mmol/L — ABNORMAL LOW (ref 22–32)
Calcium: 8.8 mg/dL — ABNORMAL LOW (ref 8.9–10.3)
Chloride: 105 mmol/L (ref 98–111)
Creatinine, Ser: 1.45 mg/dL — ABNORMAL HIGH (ref 0.61–1.24)
GFR, Estimated: 53 mL/min — ABNORMAL LOW (ref >60)
Glucose, Bld: 311 mg/dL — ABNORMAL HIGH (ref 70–99)
Potassium: 4.1 mmol/L (ref 3.5–5.1)
Sodium: 134 mmol/L — ABNORMAL LOW (ref 135–145)
Total Bilirubin: 0.8 mg/dL (ref 0.3–1.2)
Total Protein: 6.8 g/dL (ref 6.5–8.1)

## 2021-09-25 LAB — CBC WITH DIFFERENTIAL/PLATELET
Abs Immature Granulocytes: 0.01 10*3/uL (ref 0.00–0.07)
Basophils Absolute: 0 10*3/uL (ref 0.0–0.1)
Basophils Relative: 1 %
Eosinophils Absolute: 0.2 10*3/uL (ref 0.0–0.5)
Eosinophils Relative: 7 %
HCT: 24.6 % — ABNORMAL LOW (ref 39.0–52.0)
Hemoglobin: 7.9 g/dL — ABNORMAL LOW (ref 13.0–17.0)
Immature Granulocytes: 0 %
Lymphocytes Relative: 20 %
Lymphs Abs: 0.7 10*3/uL (ref 0.7–4.0)
MCH: 29 pg (ref 26.0–34.0)
MCHC: 32.1 g/dL (ref 30.0–36.0)
MCV: 90.4 fL (ref 80.0–100.0)
Monocytes Absolute: 0.3 10*3/uL (ref 0.1–1.0)
Monocytes Relative: 10 %
Neutro Abs: 2 10*3/uL (ref 1.7–7.7)
Neutrophils Relative %: 62 %
Platelets: 110 10*3/uL — ABNORMAL LOW (ref 150–400)
RBC: 2.72 MIL/uL — ABNORMAL LOW (ref 4.22–5.81)
RDW: 14.2 % (ref 11.5–15.5)
WBC: 3.2 10*3/uL — ABNORMAL LOW (ref 4.0–10.5)
nRBC: 0 % (ref 0.0–0.2)

## 2021-09-25 LAB — POCT HEMOGLOBIN: Hemoglobin: 7.8 g/dL — AB (ref 11–14.6)

## 2021-09-25 MED ORDER — FUROSEMIDE 20 MG PO TABS: ORAL_TABLET | ORAL | 2 refills | Status: DC

## 2021-09-25 MED ORDER — PANTOPRAZOLE SODIUM 40 MG PO TBEC
40.0000 mg | DELAYED_RELEASE_TABLET | Freq: Two times a day (BID) | ORAL | 3 refills | Status: DC
Start: 2021-09-25 — End: 2023-08-30

## 2021-09-25 NOTE — Telephone Encounter (Signed)
Nurses ?Discontinue HCTZ ?Lasix 20 mg 1 every morning as needed edema #30, 2 refills ?Set up stat CBC at hospital lab on Wednesday morning ? ? ?You may also forward the message to Doris who I spoke with this morning regarding all of these points ? ?Jayshawn will be doing his EGD on Thursday ?Doing the lab work on Wednesday will be helpful to make sure he is not getting into issues with a hemoglobin ?If he starts vomiting coffee grounds or having black tarry stools he will go to the ER ? ?Lasix 20 mg 1 every morning will help with the leg edema associated with his low albumin which is more than likely associated with his cirrhosis ? ?Please set up a follow-up visit within approximately 3 to 4 weeks with me ? ?Thanks-Dr. Nicki Reaper ?

## 2021-09-25 NOTE — Progress Notes (Signed)
? ?  Subjective:  ? ? Patient ID: Austin Morgan, male    DOB: 13-Mar-1956, 66 y.o.   MRN: [id number] ? ?HPI ?Patient had onset of fatigue tiredness shortness of breath was discovered to have a very low hemoglobin.  Was admitted into the hospital under observation given 3 units of blood and sent home ?He denies any black stools but does state at times he coughs up saliva that has blood in it and at times he says he has a metallic taste in the back of his mouth he has noticed some bleeding from the gums ?His blood work did show low platelets.  He does have a history of cirrhosis. ?On previous EGD he did not have varicocele ?Patient here for hospital follow up for anemia. ? ?Review of Systems ? ?   ?Objective:  ? Physical Exam ? ?General-in no acute distress ?Eyes-no discharge ?Lungs-respiratory rate normal, CTA ?CV-no murmurs,RRR ?Extremities skin warm dry does have some lower leg edema edema ?Neuro grossly normal ?Behavior normal, alert ? ?Abdomen is soft no guarding rebound or tenderness ?Rectal exam normal except heme positive brown stool ? ?   ?Assessment & Plan:  ?1. Anemia, unspecified type ?Significant anemia ?Hemoglobin still on the low end ?Check CBC to get accurate reflection ?Positive Hemoccult card ?Would benefit from EGD ?- POCT hemoglobin ?- CBC with Differential ?- Comprehensive Metabolic Panel (CMET) ? ?2. Pedal edema ?Will look at albumin this could be a reflection of his cirrhosis may need loop diuretic ?- CBC with Differential ?- Comprehensive Metabolic Panel (CMET) ? ?3. Heme positive stool ?Will need EGD will coordinate with gastroenterology they have saw him late last week ?- CBC with Differential ?- Comprehensive Metabolic Panel (CMET) ? ?4. Acute kidney injury superimposed on chronic kidney disease ([place]) ?Read look at creatinine function ?- CBC with Differential ?- Comprehensive Metabolic Panel (CMET) ? ?Currently stay with half lisinopril half HCTZ ?Recommend reducing glipizide to half tablet twice  daily ?If any low sugars to notify us ? ?

## 2021-09-25 NOTE — Telephone Encounter (Signed)
Recall for ultrasound 

## 2021-09-25 NOTE — Telephone Encounter (Signed)
Spoke with [facility] and added on AFP tumor marker.  ?

## 2021-09-25 NOTE — Addendum Note (Signed)
Addended by: Cheron Every on: 09/25/2021 09:14 AM ? ? Modules accepted: Orders ? ?

## 2021-09-25 NOTE — Telephone Encounter (Signed)
LMOVM both home/mobile #. ? ?Spoke with Magda Paganini. Will schedule EGD with Dr. Gala Romney this week and then further instructions to follow after EGD. ?

## 2021-09-25 NOTE — Telephone Encounter (Signed)
Spoke with spouse/pt. EGD with Dr. Gala Romney scheduled for 4/20. Aware will send EGD instructions to mychart and will inform when pre-op appt is scheduled. ?

## 2021-09-25 NOTE — Telephone Encounter (Signed)
Pt recently saw Austin Morgan for f/u. Is patient due for [place] now? thanks ?

## 2021-09-25 NOTE — Telephone Encounter (Signed)
Yes we can schedule with Austin Morgan, patient was agreeable when I spoke to him on Friday. We really need an EGD as soon as possible, this week. We can schedule the colonoscopy later. Same instructions as before minus bowel prep. ?

## 2021-09-25 NOTE — Telephone Encounter (Signed)
Procedure time has been moved up to 8:45am by endo. New instructions sent via mychart along with pre-op appt  ?

## 2021-09-26 LAB — SPECIMEN STATUS REPORT

## 2021-09-26 LAB — AFP TUMOR MARKER: AFP, Serum, Tumor Marker: <1.8 ng/mL (ref 0.0–8.4)

## 2021-09-26 NOTE — Telephone Encounter (Signed)
Recall sent 

## 2021-09-26 NOTE — Telephone Encounter (Signed)
Yes he is due. RUQ U/S sufficient ?

## 2021-09-26 NOTE — Patient Instructions (Signed)
? ? ? ? ? ? Austin Morgan ? 09/26/2021  ?  ? '@PREFPERIOPPHARMACY'$ @ ? ? Your procedure is scheduled on  09/28/2021. ? ? Report to [name] at  [facility]. ? ? Call this number if you have problems the morning of surgery: ? [phone] ? ? Remember: ? Follow the diet instructions given to you by the office. ? ?    Take 8 units (1/2 dose) of your insulin the night before your procedure. ?  ? ?DO NOT take any medications for diabetes the morning of yor procedure. ? ? ? Take these medicines the morning of surgery with A SIP OF WATER  ? ?                                     depade, protonix. ?  ? ? Do not wear jewelry, make-up or nail polish. ? Do not wear lotions, powders, or perfumes, or deodorant. ? Do not shave 48 hours prior to surgery.  Men may shave face and neck. ? Do not bring valuables to the hospital. ? [facility] is not responsible for any belongings or valuables. ? ?Contacts, dentures or bridgework may not be worn into surgery.  Leave your suitcase in the car.  After surgery it may be brought to your room. ? ?For patients admitted to the hospital, discharge time will be determined by your treatment team. ? ?Patients discharged the day of surgery will not be allowed to drive home and must have someone with them for 24 hours.  ? ? ?Special instructions:   DO NOT smoke tobacco or vape for 24 hours before your procedure. ? ?Please read over the following fact sheets that you were given. ?Anesthesia Post-op Instructions and Care and Recovery After Surgery ?  ? ? ? Upper Endoscopy, Adult, Care After ?This sheet gives you information about how to care for yourself after your procedure. Your health care provider may also give you more specific instructions. If you have problems or questions, contact your health care provider. ?What can I expect after the procedure? ?After the procedure, it is common to have: ?A sore throat. ?Mild stomach pain or discomfort. ?Bloating. ?Nausea. ?Follow these instructions at  home: ? ?Follow instructions from your health care provider about what to eat or drink after your procedure. ?Return to your normal activities as told by your health care provider. Ask your health care provider what activities are safe for you. ?Take over-the-counter and prescription medicines only as told by your health care provider. ?If you were given a sedative during the procedure, it can affect you for several hours. Do not drive or operate machinery until your health care provider says that it is safe. ?Keep all follow-up visits as told by your health care provider. This is important. ?Contact a health care provider if you have: ?A sore throat that lasts longer than one day. ?Trouble swallowing. ?Get help right away if: ?You vomit blood or your vomit looks like coffee grounds. ?You have: ?A fever. ?Bloody, black, or tarry stools. ?A severe sore throat or you cannot swallow. ?Difficulty breathing. ?Severe pain in your chest or abdomen. ?Summary ?After the procedure, it is common to have a sore throat, mild stomach discomfort, bloating, and nausea. ?If you were given a sedative during the procedure, it can affect you for several hours. Do not drive or operate machinery until your health care provider says that it  is safe. ?Follow instructions from your health care provider about what to eat or drink after your procedure. ?Return to your normal activities as told by your health care provider. ?This information is not intended to replace advice given to you by your health care provider. Make sure you discuss any questions you have with your health care provider. ?Document Revised: 04/03/2019 Document Reviewed: 10/28/2017 ?Elsevier Patient Education ? [place]. ?Monitored Anesthesia Care, Care After ?This sheet gives you information about how to care for yourself after your procedure. Your health care provider may also give you more specific instructions. If you have problems or questions, contact your  health care provider. ?What can I expect after the procedure? ?After the procedure, it is common to have: ?Tiredness. ?Forgetfulness about what happened after the procedure. ?Impaired judgment for important decisions. ?Nausea or vomiting. ?Some difficulty with balance. ?Follow these instructions at home: ?For the time period you were told by your health care provider: ? ?  ? ?Rest as needed. ?Do not participate in activities where you could fall or become injured. ?Do not drive or use machinery. ?Do not drink alcohol. ?Do not take sleeping pills or medicines that cause drowsiness. ?Do not make important decisions or sign legal documents. ?Do not take care of children on your own. ?Eating and drinking ?Follow the diet that is recommended by your health care provider. ?Drink enough fluid to keep your urine pale yellow. ?If you vomit: ?Drink water, juice, or soup when you can drink without vomiting. ?Make sure you have little or no nausea before eating solid foods. ?General instructions ?Have a responsible adult stay with you for the time you are told. It is important to have someone help care for you until you are awake and alert. ?Take over-the-counter and prescription medicines only as told by your health care provider. ?If you have sleep apnea, surgery and certain medicines can increase your risk for breathing problems. Follow instructions from your health care provider about wearing your sleep device: ?Anytime you are sleeping, including during daytime naps. ?While taking prescription pain medicines, sleeping medicines, or medicines that make you drowsy. ?Avoid smoking. ?Keep all follow-up visits as told by your health care provider. This is important. ?Contact a health care provider if: ?You keep feeling nauseous or you keep vomiting. ?You feel light-headed. ?You are still sleepy or having trouble with balance after 24 hours. ?You develop a rash. ?You have a fever. ?You have redness or swelling around the IV  site. ?Get help right away if: ?You have trouble breathing. ?You have new-onset confusion at home. ?Summary ?For several hours after your procedure, you may feel tired. You may also be forgetful and have poor judgment. ?Have a responsible adult stay with you for the time you are told. It is important to have someone help care for you until you are awake and alert. ?Rest as told. Do not drive or operate machinery. Do not drink alcohol or take sleeping pills. ?Get help right away if you have trouble breathing, or if you suddenly become confused. ?This information is not intended to replace advice given to you by your health care provider. Make sure you discuss any questions you have with your health care provider. ?Document Revised: 05/02/2021 Document Reviewed: 04/30/2019 ?Elsevier Patient Education ? [place]. ? ?

## 2021-09-27 ENCOUNTER — Encounter (HOSPITAL_COMMUNITY)
Admission: RE | Admit: 2021-09-27 | Discharge: 2021-09-27 | Disposition: A | Discharge status: Home / Self Care | Payer: Medicare HMO | Source: Ambulatory Visit | Attending: Internal Medicine | Admitting: Internal Medicine

## 2021-09-27 ENCOUNTER — Encounter (HOSPITAL_COMMUNITY): Payer: Self-pay

## 2021-09-27 VITALS — BP 136/76 | HR 81 | Temp 97.6°F | Resp 18 | Ht 66.5 in | Wt 193.2 lb

## 2021-09-27 DIAGNOSIS — Z01818 Encounter for other preprocedural examination: Secondary | ICD-10-CM | POA: Diagnosis not present

## 2021-09-27 DIAGNOSIS — D649 Anemia, unspecified: Secondary | ICD-10-CM | POA: Insufficient documentation

## 2021-09-27 DIAGNOSIS — K7460 Unspecified cirrhosis of liver: Secondary | ICD-10-CM | POA: Diagnosis not present

## 2021-09-27 DIAGNOSIS — T502X5A Adverse effect of carbonic-anhydrase inhibitors, benzothiadiazides and other diuretics, initial encounter: Secondary | ICD-10-CM | POA: Insufficient documentation

## 2021-09-27 DIAGNOSIS — E876 Hypokalemia: Secondary | ICD-10-CM | POA: Insufficient documentation

## 2021-09-27 HISTORY — DX: Depression, unspecified: F32.A

## 2021-09-27 HISTORY — DX: Anxiety disorder, unspecified: F41.9

## 2021-09-27 LAB — COMPREHENSIVE METABOLIC PANEL
ALT: 18 U/L (ref 0–44)
AST: 28 U/L (ref 15–41)
Albumin: 3.2 g/dL — ABNORMAL LOW (ref 3.5–5.0)
Alkaline Phosphatase: 115 U/L (ref 38–126)
Anion gap: 9 (ref 5–15)
BUN: 23 mg/dL (ref 8–23)
CO2: 24 mmol/L (ref 22–32)
Calcium: 9 mg/dL (ref 8.9–10.3)
Chloride: 103 mmol/L (ref 98–111)
Creatinine, Ser: 1.45 mg/dL — ABNORMAL HIGH (ref 0.61–1.24)
GFR, Estimated: 53 mL/min — ABNORMAL LOW (ref >60)
Glucose, Bld: 131 mg/dL — ABNORMAL HIGH (ref 70–99)
Potassium: 3.8 mmol/L (ref 3.5–5.1)
Sodium: 136 mmol/L (ref 135–145)
Total Bilirubin: 0.9 mg/dL (ref 0.3–1.2)
Total Protein: 7.2 g/dL (ref 6.5–8.1)

## 2021-09-27 LAB — CBC WITH DIFFERENTIAL/PLATELET
Abs Immature Granulocytes: 0.01 10*3/uL (ref 0.00–0.07)
Basophils Absolute: 0 10*3/uL (ref 0.0–0.1)
Basophils Relative: 1 %
Eosinophils Absolute: 0.3 10*3/uL (ref 0.0–0.5)
Eosinophils Relative: 7 %
HCT: 26.1 % — ABNORMAL LOW (ref 39.0–52.0)
Hemoglobin: 8.2 g/dL — ABNORMAL LOW (ref 13.0–17.0)
Immature Granulocytes: 0 %
Lymphocytes Relative: 23 %
Lymphs Abs: 1 10*3/uL (ref 0.7–4.0)
MCH: 27.8 pg (ref 26.0–34.0)
MCHC: 31.4 g/dL (ref 30.0–36.0)
MCV: 88.5 fL (ref 80.0–100.0)
Monocytes Absolute: 0.4 10*3/uL (ref 0.1–1.0)
Monocytes Relative: 10 %
Neutro Abs: 2.4 10*3/uL (ref 1.7–7.7)
Neutrophils Relative %: 59 %
Platelets: 114 10*3/uL — ABNORMAL LOW (ref 150–400)
RBC: 2.95 MIL/uL — ABNORMAL LOW (ref 4.22–5.81)
RDW: 14 % (ref 11.5–15.5)
WBC: 4.1 10*3/uL (ref 4.0–10.5)
nRBC: 0 % (ref 0.0–0.2)

## 2021-09-27 LAB — TYPE AND SCREEN
ABO/RH(D): O POS
Antibody Screen: NEGATIVE

## 2021-09-27 NOTE — Progress Notes (Signed)
?   very important  You completed this message on 09/27/2021. The information that appears might not be up to date.  ? ?Message ?Received: Today ?Rourk, Cristopher Estimable, MD  Jacqulynn Cadet, RN ?Would be okay to type and screen.  As long as he stays in the 7 range he will be good without a transfusion.  We like to be restrictive in cirrhotics.  Cirrhotics actually do better if they are allowed to hover at a lower range.  Outcomes are actually worse if they get transfused into the 8 or 9 range without overt bleeding..  .  Thanks for the contact.   ?

## 2021-09-28 ENCOUNTER — Ambulatory Visit (HOSPITAL_BASED_OUTPATIENT_CLINIC_OR_DEPARTMENT_OTHER): Payer: Medicare HMO | Admitting: Anesthesiology

## 2021-09-28 ENCOUNTER — Ambulatory Visit (HOSPITAL_COMMUNITY)
Admission: RE | Admit: 2021-09-28 | Discharge: 2021-09-28 | Disposition: A | Discharge status: Home / Self Care | Payer: Medicare HMO | Attending: Internal Medicine | Admitting: Internal Medicine

## 2021-09-28 ENCOUNTER — Ambulatory Visit (HOSPITAL_COMMUNITY): Payer: Medicare HMO | Admitting: Anesthesiology

## 2021-09-28 ENCOUNTER — Other Ambulatory Visit: Payer: Self-pay

## 2021-09-28 ENCOUNTER — Telehealth: Payer: Self-pay | Admitting: Family Medicine

## 2021-09-28 ENCOUNTER — Encounter (HOSPITAL_COMMUNITY)
Admission: RE | Disposition: A | Discharge status: Home / Self Care | Payer: Self-pay | Source: Home / Self Care | Attending: Internal Medicine

## 2021-09-28 ENCOUNTER — Encounter (HOSPITAL_COMMUNITY): Payer: Self-pay | Admitting: Internal Medicine

## 2021-09-28 ENCOUNTER — Telehealth: Payer: Self-pay

## 2021-09-28 DIAGNOSIS — R69 Illness, unspecified: Secondary | ICD-10-CM | POA: Diagnosis not present

## 2021-09-28 DIAGNOSIS — D500 Iron deficiency anemia secondary to blood loss (chronic): Secondary | ICD-10-CM | POA: Insufficient documentation

## 2021-09-28 DIAGNOSIS — I8510 Secondary esophageal varices without bleeding: Secondary | ICD-10-CM

## 2021-09-28 DIAGNOSIS — Z87891 Personal history of nicotine dependence: Secondary | ICD-10-CM | POA: Insufficient documentation

## 2021-09-28 DIAGNOSIS — I10 Essential (primary) hypertension: Secondary | ICD-10-CM | POA: Diagnosis not present

## 2021-09-28 DIAGNOSIS — K921 Melena: Secondary | ICD-10-CM

## 2021-09-28 DIAGNOSIS — K317 Polyp of stomach and duodenum: Secondary | ICD-10-CM | POA: Insufficient documentation

## 2021-09-28 DIAGNOSIS — K760 Fatty (change of) liver, not elsewhere classified: Secondary | ICD-10-CM | POA: Diagnosis not present

## 2021-09-28 DIAGNOSIS — I8501 Esophageal varices with bleeding: Secondary | ICD-10-CM | POA: Insufficient documentation

## 2021-09-28 DIAGNOSIS — F1010 Alcohol abuse, uncomplicated: Secondary | ICD-10-CM | POA: Diagnosis not present

## 2021-09-28 DIAGNOSIS — K7460 Unspecified cirrhosis of liver: Secondary | ICD-10-CM | POA: Diagnosis not present

## 2021-09-28 DIAGNOSIS — Z86010 Personal history of colonic polyps: Secondary | ICD-10-CM | POA: Diagnosis not present

## 2021-09-28 DIAGNOSIS — Z79899 Other long term (current) drug therapy: Secondary | ICD-10-CM | POA: Insufficient documentation

## 2021-09-28 DIAGNOSIS — K3189 Other diseases of stomach and duodenum: Secondary | ICD-10-CM | POA: Diagnosis not present

## 2021-09-28 DIAGNOSIS — R188 Other ascites: Secondary | ICD-10-CM | POA: Insufficient documentation

## 2021-09-28 DIAGNOSIS — K766 Portal hypertension: Secondary | ICD-10-CM | POA: Diagnosis not present

## 2021-09-28 DIAGNOSIS — I8511 Secondary esophageal varices with bleeding: Secondary | ICD-10-CM | POA: Diagnosis not present

## 2021-09-28 DIAGNOSIS — K219 Gastro-esophageal reflux disease without esophagitis: Secondary | ICD-10-CM | POA: Diagnosis not present

## 2021-09-28 DIAGNOSIS — E119 Type 2 diabetes mellitus without complications: Secondary | ICD-10-CM | POA: Insufficient documentation

## 2021-09-28 HISTORY — PX: ESOPHAGEAL BANDING: SHX5518

## 2021-09-28 HISTORY — PX: ESOPHAGOGASTRODUODENOSCOPY (EGD) WITH PROPOFOL: SHX5813

## 2021-09-28 LAB — GLUCOSE, CAPILLARY: Glucose-Capillary: 112 mg/dL — ABNORMAL HIGH (ref 70–99)

## 2021-09-28 SURGERY — ESOPHAGOGASTRODUODENOSCOPY (EGD) WITH PROPOFOL
Anesthesia: General

## 2021-09-28 MED ORDER — PROPOFOL 10 MG/ML IV BOLUS
INTRAVENOUS | Status: DC | PRN
  Administered 2021-09-28: 50 mg via INTRAVENOUS
  Administered 2021-09-28: 40 mg via INTRAVENOUS
  Administered 2021-09-28: 60 mg via INTRAVENOUS
  Administered 2021-09-28 (×2): 30 mg via INTRAVENOUS

## 2021-09-28 MED ORDER — LACTATED RINGERS IV SOLN: INTRAVENOUS | Status: DC | PRN

## 2021-09-28 NOTE — Discharge Instructions (Signed)
EGD ?Discharge instructions ?Please read the instructions outlined below and refer to this sheet in the next few weeks. These discharge instructions provide you with general information on caring for yourself after you leave the hospital. Your doctor may also give you specific instructions. While your treatment has been planned according to the most current medical practices available, unavoidable complications occasionally occur. If you have any problems or questions after discharge, please call your doctor. ?ACTIVITY ?You may resume your regular activity but move at a slower pace for the next 24 hours.  ?Take frequent rest periods for the next 24 hours.  ?Walking will help expel (get rid of) the air and reduce the bloated feeling in your abdomen.  ?No driving for 24 hours (because of the anesthesia (medicine) used during the test).  ?You may shower.  ?Do not sign any important legal documents or operate any machinery for 24 hours (because of the anesthesia used during the test).  ?NUTRITION ?Drink plenty of fluids.  ?You may resume your normal diet.  ?Begin with a light meal and progress to your normal diet.  ?Avoid alcoholic beverages for 24 hours or as instructed by your caregiver.  ?MEDICATIONS ?You may resume your normal medications unless your caregiver tells you otherwise.  ?WHAT YOU CAN EXPECT TODAY ?You may experience abdominal discomfort such as a feeling of fullness or ?gas? pains.  ?FOLLOW-UP ?Your doctor will discuss the results of your test with you.  ?SEEK IMMEDIATE MEDICAL ATTENTION IF ANY OF THE FOLLOWING OCCUR: ?Excessive nausea (feeling sick to your stomach) and/or vomiting.  ?Severe abdominal pain and distention (swelling).  ?Trouble swallowing.  ?Temperature over 101? F (37.8? C).  ?Rectal bleeding or vomiting of blood.   ? ?Esophageal varices were banded today ? ?Need to begin nadolol 40 mg daily to take the pressure off the varicose veins to lessen the chances of esophagus and stomach  bleeding in the future new prescription provided ? ?Repeat EGD in 2 to 3 weeks ? ? At patient request, asked called Dominica Severin at [phone]-reviewed findings. ?

## 2021-09-28 NOTE — Telephone Encounter (Signed)
Pt wife [name] calling to let PCP know that had EGD done this morning. Pt was placed on Labetalol 40 mg by Dr.Rourke. Pt is currently on HCTZ and Lisinopril. Wife is wanting to make sure it is OK for patient to be taking all 3. Wife is going to monitor BP. Please advise. Thank you ?Wife is OK with my chart message.  ?

## 2021-09-28 NOTE — Telephone Encounter (Signed)
Pt was made aware and verbalized understanding.  

## 2021-09-28 NOTE — Anesthesia Preprocedure Evaluation (Signed)
Anesthesia Evaluation  ?Patient identified by MRN, date of birth, ID band ?Patient awake ? ? ? ?Reviewed: ?Allergy & Precautions, H&P , NPO status , Patient's Chart, lab work & pertinent test results, reviewed documented beta blocker date and time  ? ?History of Anesthesia Complications ?(+) PONV and history of anesthetic complications ? ?Airway ?Mallampati: II ? ?TM Distance: >3 FB ?Neck ROM: full ? ? ? Dental ?no notable dental hx. ? ?  ?Pulmonary ?sleep apnea , former smoker,  ?  ?Pulmonary exam normal ?breath sounds clear to auscultation ? ? ? ? ? ? Cardiovascular ?Exercise Tolerance: Good ?hypertension, negative cardio ROS ? ? ?Rhythm:regular Rate:Normal ? ? ?  ?Neuro/Psych ?PSYCHIATRIC DISORDERS Anxiety Depression negative neurological ROS ?   ? GI/Hepatic ?Neg liver ROS, GERD  Medicated,  ?Endo/Other  ?negative endocrine ROSdiabetes ? Renal/GU ?Renal disease  ?negative genitourinary ?  ?Musculoskeletal ? ? Abdominal ?  ?Peds ? Hematology ? ?(+) Blood dyscrasia, anemia ,   ?Anesthesia Other Findings ? ? Reproductive/Obstetrics ?negative OB ROS ? ?  ? ? ? ? ? ? ? ? ? ? ? ? ? ?  ?  ? ? ? ? ? ? ? ? ?Anesthesia Physical ?Anesthesia Plan ? ?ASA: 2 ? ?Anesthesia Plan: General  ? ?Post-op Pain Management:   ? ?Induction:  ? ?PONV Risk Score and Plan: Propofol infusion ? ?Airway Management Planned:  ? ?Additional Equipment:  ? ?Intra-op Plan:  ? ?Post-operative Plan:  ? ?Informed Consent: I have reviewed the patients History and Physical, chart, labs and discussed the procedure including the risks, benefits and alternatives for the proposed anesthesia with the patient or authorized representative who has indicated his/her understanding and acceptance.  ? ? ? ?Dental Advisory Given ? ?Plan Discussed with: CRNA ? ?Anesthesia Plan Comments:   ? ? ? ? ? ? ?Anesthesia Quick Evaluation ? ?

## 2021-09-28 NOTE — Anesthesia Postprocedure Evaluation (Signed)
Anesthesia Post Note ? ?Patient: Austin Morgan ? ?Procedure(s) Performed: ESOPHAGOGASTRODUODENOSCOPY (EGD) WITH PROPOFOL ?ESOPHAGEAL BANDING ? ?Patient location during evaluation: Short Stay ?Anesthesia Type: General ?Level of consciousness: awake and alert ?Pain management: pain level controlled ?Vital Signs Assessment: post-procedure vital signs reviewed and stable ?Respiratory status: spontaneous breathing ?Cardiovascular status: blood pressure returned to baseline and stable ?Postop Assessment: no apparent nausea or vomiting ?Anesthetic complications: no ? ? ?No notable events documented. ? ? ?Last Vitals:  ?Vitals:  ? 09/28/21 0757  ?BP: 126/73  ?Pulse: 74  ?Resp: 12  ?Temp: 36.6 ?C  ?SpO2: 99%  ?  ?Last Pain:  ?Vitals:  ? 09/28/21 0909  ?TempSrc:   ?PainSc: 0-No pain  ? ? ?  ?  ?  ?  ?  ?  ? ?Austin Morgan ? ? ? ? ?

## 2021-09-28 NOTE — Op Note (Signed)
[facility] ?Patient Name: Austin Morgan ?Procedure Date: 09/28/2021 8:42 AM ?MRN: [id number] ?Date of Birth: 08-16-1955 ?Attending MD: Norvel Richards , MD ?CSN: [id number] ?Age: 66 ?Admit Type: Outpatient ?Procedure:                Upper GI endoscopy ?Indications:              Melena /history of intermittent gum bleeding and  ?                          epistaxis (as recently as a's morning); hemoglobin  ?                          8.2 yesterday ?Providers:                Norvel Richards, MD, Caprice Kluver, Eugene Garnet  ?                          Shanon Brow, Technician ?Referring MD:              ?Medicines:                Propofol per Anesthesia ?Complications:            No immediate complications. ?Estimated Blood Loss:     Estimated blood loss: none. ?Procedure:                Pre-Anesthesia Assessment: ?                          - Prior to the procedure, a History and Physical  ?                          was performed, and patient medications and  ?                          allergies were reviewed. The patient's tolerance of  ?                          previous anesthesia was also reviewed. The risks  ?                          and benefits of the procedure and the sedation  ?                          options and risks were discussed with the patient.  ?                          All questions were answered, and informed consent  ?                          was obtained. Prior Anticoagulants: The patient has  ?                          taken no previous anticoagulant or antiplatelet  ?                          agents.  ASA Grade Assessment: III - A patient with  ?                          severe systemic disease. After reviewing the risks  ?                          and benefits, the patient was deemed in  ?                          satisfactory condition to undergo the procedure. ?                          After obtaining informed consent, the endoscope was  ?                          passed under direct vision.  Throughout the  ?                          procedure, the patient's blood pressure, pulse, and  ?                          oxygen saturations were monitored continuously. The  ?                          GIF-H190 ([id number]) scope was introduced through the  ?                          mouth, and advanced to the second part of duodenum.  ?                          The upper GI endoscopy was accomplished without  ?                          difficulty. The patient tolerated the procedure  ?                          well. ?Scope In: 9:13:51 AM ?Scope Out: 9:26:11 AM ?Total Procedure Duration: 0 hours 12 minutes 20 seconds  ?Findings: ?     Grade 2-3 esophageal varices distal 5 cm of tubular esophagus. The grade  ?     3 varix and a small cherry red spot. No esophagitis. Prominent changes  ?     of portal gastropathy diffusely. Friability of the gastric mucosa in the  ?     area of the cardia. No gastric varices seen. Patent pylorus. Multiple 1  ?     to 2 mm hyperplastic appearing gastric polyps. ?     The duodenal bulb and second portion of the duodenum were normal. Scope  ?     withdrawn. Microvasive 7 shot bander attached scope reintroduced the  ?     esophagus. 1 band placed on each of the 3 variceal columns. Good  ?     hemostasis maintained. ?Impression:               - Esophageal varices with bleeding stigmata status  ?  post esophageal band ligation. Normal duodenal bulb  ?                          and second portion of the duodenum. ?                          -Portal gastropathy/friable gastric mucosa no  ?                          specimens collected. Gastric polyps not manipulated. ?                          - Spurious GI bleeding (in part) from ENT losses  ?                          may be a contributing factor to the picture of GI  ?                          bleeding ?Moderate Sedation: ?     Moderate (conscious) sedation was personally administered by an  ?     anesthesia professional.  The following parameters were monitored: oxygen  ?     saturation, heart rate, blood pressure, respiratory rate, EKG, adequacy  ?     of pulmonary ventilation, and response to care. ?Recommendation:           - Patient has a contact number available for  ?                          emergencies. The signs and symptoms of potential  ?                          delayed complications were discussed with the  ?                          patient. Return to normal activities tomorrow.  ?                          Written discharge instructions were provided to the  ?                          patient. ?                          - Advance diet as tolerated. ?                          - Continue present medications. ?                          - Repeat upper endoscopy in 2 weeks for retreatment. ?                          - Return to GI office (date not yet determined).  ?  Begin Nadolol 40 mg daily to decrease chance of  ?                          recurrent GI bleeding. ?Procedure Code(s):        --- Professional --- ?                          [phone], Esophagogastroduodenoscopy, flexible,  ?                          transoral; diagnostic, including collection of  ?                          specimen(s) by brushing or washing, when performed  ?                          (separate procedure) ?Diagnosis Code(s):        --- Professional --- ?                          K92.1, Melena (includes Hematochezia) ?CPT copyright 2019 [employer] Association. All rights reserved. ?The codes documented in this report are preliminary and upon coder review may  ?be revised to meet current compliance requirements. ?Cristopher Estimable. Indyah Saulnier, MD ?Norvel Richards, MD ?09/28/2021 9:41:31 AM ?This report has been signed electronically. ?Number of Addenda: 0 ?

## 2021-09-28 NOTE — Transfer of Care (Signed)
Immediate Anesthesia Transfer of Care Note ? ?Patient: Austin Morgan ? ?Procedure(s) Performed: ESOPHAGOGASTRODUODENOSCOPY (EGD) WITH PROPOFOL ?ESOPHAGEAL BANDING ? ?Patient Location: Short Stay ? ?Anesthesia Type:General ? ?Level of Consciousness: awake ? ?Airway & Oxygen Therapy: Patient Spontanous Breathing ? ?Post-op Assessment: Report given to RN ? ?Post vital signs: Reviewed and stable ? ?Last Vitals:  ?Vitals Value Taken Time  ?BP    ?Temp    ?Pulse    ?Resp    ?SpO2    ? ? ?Last Pain:  ?Vitals:  ? 09/28/21 0909  ?TempSrc:   ?PainSc: 0-No pain  ?   ? ?  ? ?Complications: No notable events documented. ?

## 2021-09-28 NOTE — Telephone Encounter (Signed)
Pt's wife called stating that the patient had a procedure done today and is having upper abdomen pain/burning. Pt is rating it at a 4. Please advise.  ?

## 2021-09-28 NOTE — Interval H&P Note (Signed)
History and Physical Interval Note: ? ?09/28/2021 ?8:55 AM ? ?Austin Morgan  has presented today for surgery, with the diagnosis of cirrhosis, screen for esophageal varices, profound anemia.  The various methods of treatment have been discussed with the patient and family. After consideration of risks, benefits and other options for treatment, the patient has consented to  Procedure(s) with comments: ?ESOPHAGOGASTRODUODENOSCOPY (EGD) WITH PROPOFOL (N/A) - 3:00pm as a surgical intervention.  The patient's history has been reviewed, patient examined, no change in status, stable for surgery.  I have reviewed the patient's chart and labs.  Questions were answered to the patient's satisfaction.   ? ? ?Austin Morgan ? ?Hemoglobin 8.2 yesterday.  Patient endorses intermittent epistaxis and gum bleeding (as recently as this morning).  Denies abdominal pain.  Here for an EGD to further evaluate recent melena/Hemoccult positive stool. ?The risks, benefits, limitations, alternatives and imponderables have been reviewed with the patient. Potential for esophageal dilation, biopsy, etc. have also been reviewed.  Questions have been answered. All parties agreeable.  ?

## 2021-09-29 ENCOUNTER — Ambulatory Visit (HOSPITAL_COMMUNITY)
Admission: RE | Admit: 2021-09-29 | Discharge: 2021-09-29 | Disposition: A | Discharge status: Home / Self Care | Payer: Medicare HMO | Source: Ambulatory Visit | Attending: Family Medicine | Admitting: Family Medicine

## 2021-09-29 DIAGNOSIS — R011 Cardiac murmur, unspecified: Secondary | ICD-10-CM | POA: Diagnosis not present

## 2021-09-29 NOTE — Telephone Encounter (Signed)
Pt's wife [name] informed of md message and recommendations. Verbalized understanding.. She states that he will go back in 2 weeks to have a repeat EGD, not sure if they will order the labs. She says she would call and let us know if they didn't. She says pt's blood pressure this morning was 142/77, HR-62 ?

## 2021-09-29 NOTE — Telephone Encounter (Signed)
It is okay to take all 3 ?Monitor blood pressure if he starts seeing low we will back down on lisinopril and HCTZ ?Please keep [place] posted how things are going it is perfectly fine to send [place] a MyChart messages to update [place] regarding blood pressures ?Please call if anything urgent ?Also would be wise to do a follow-up office visit within 4 to 6 weeks ?Also I would recommend repeating a CBC within 2 weeks unless gastroenterology has already ordered this thank you ?

## 2021-09-29 NOTE — Progress Notes (Signed)
*  PRELIMINARY RESULTS* ?Echocardiogram ?2D Echocardiogram has been performed. ? ?Austin Morgan ?09/29/2021, 2:37 PM ?

## 2021-10-02 ENCOUNTER — Encounter (HOSPITAL_COMMUNITY): Payer: Self-pay | Admitting: Internal Medicine

## 2021-10-02 ENCOUNTER — Telehealth: Payer: Self-pay | Admitting: Family Medicine

## 2021-10-02 DIAGNOSIS — D649 Anemia, unspecified: Secondary | ICD-10-CM

## 2021-10-02 NOTE — Telephone Encounter (Signed)
Patient states is doing well on new Blood pressure medication prescribed by Dr. Gala Romney. He would like to know if you seen he results from specialist. ?

## 2021-10-02 NOTE — Telephone Encounter (Signed)
Patient informed of md message and recommendations.  Verbalized understanding. Lab ordered in epic. ?

## 2021-10-02 NOTE — Telephone Encounter (Signed)
I was able to see his endoscopy and the findings.  I would recommend a CBC within 2 to 3 weeks ?Please put this in the orders ?He has a follow-up visit with me in May.  Important for him to keep that appointment. ?

## 2021-10-03 ENCOUNTER — Telehealth: Payer: Self-pay | Admitting: Gastroenterology

## 2021-10-03 ENCOUNTER — Telehealth: Payer: Self-pay | Admitting: Internal Medicine

## 2021-10-03 DIAGNOSIS — D649 Anemia, unspecified: Secondary | ICD-10-CM | POA: Diagnosis not present

## 2021-10-03 DIAGNOSIS — K7460 Unspecified cirrhosis of liver: Secondary | ICD-10-CM

## 2021-10-03 NOTE — Addendum Note (Signed)
Addended by: Cheron Every on: 10/03/2021 09:24 AM ? ? Modules accepted: Orders ? ?

## 2021-10-03 NOTE — Telephone Encounter (Signed)
Patient received letter to schedule his ultrasound  ?

## 2021-10-03 NOTE — Telephone Encounter (Signed)
Will call pt to schedule procedure when Dr. Rourk's next schedule is available. °

## 2021-10-03 NOTE — Telephone Encounter (Signed)
Looks like U/S is ordered under Abbey Chatters (he is Rourk patient). Should have been me. Can it be changed so that I can make sure results come to me? ?

## 2021-10-03 NOTE — Telephone Encounter (Signed)
Per Dr. Bari Mantis report, patient needs repeat EGD two weeks after last one. Please arrange.  ? ?EGD with esophageal variceal banding with Dr. Gala Romney. ASA 3.  ? ?Day before EGD: half dose levemir, metformin AM only, glipizide AM dose only. Hold this meds morning of EGD. ? ?FYI Dr. Gala Romney.  ? ? ?

## 2021-10-03 NOTE — Telephone Encounter (Signed)
Mychart message sent with appt details 

## 2021-10-04 LAB — CBC WITH DIFFERENTIAL/PLATELET
Basophils Absolute: 0 10*3/uL (ref 0.0–0.2)
Basos: 1 %
EOS (ABSOLUTE): 0.4 10*3/uL (ref 0.0–0.4)
Eos: 10 %
Hematocrit: 27.7 % — ABNORMAL LOW (ref 37.5–51.0)
Hemoglobin: 9.1 g/dL — ABNORMAL LOW (ref 13.0–17.7)
Immature Grans (Abs): 0 10*3/uL (ref 0.0–0.1)
Immature Granulocytes: 0 %
Lymphocytes Absolute: 1.1 10*3/uL (ref 0.7–3.1)
Lymphs: 25 %
MCH: 28.5 pg (ref 26.6–33.0)
MCHC: 32.9 g/dL (ref 31.5–35.7)
MCV: 87 fL (ref 79–97)
Monocytes Absolute: 0.4 10*3/uL (ref 0.1–0.9)
Monocytes: 10 %
Neutrophils Absolute: 2.3 10*3/uL (ref 1.4–7.0)
Neutrophils: 54 %
Platelets: 123 10*3/uL — ABNORMAL LOW (ref 150–450)
RBC: 3.19 x10E6/uL — ABNORMAL LOW (ref 4.14–5.80)
RDW: 13.1 % (ref 11.6–15.4)
WBC: 4.3 10*3/uL (ref 3.4–10.8)

## 2021-10-05 ENCOUNTER — Telehealth: Payer: Self-pay | Admitting: Internal Medicine

## 2021-10-05 NOTE — Telephone Encounter (Signed)
Pt had EGD on 09/28/2021 and [facility]MR wants him to have a repeat EGD in 2 weeks. [phone] ?

## 2021-10-05 NOTE — Telephone Encounter (Signed)
Called pt, informed him we are currently awaiting Dr. Roseanne Kaufman June schedule due to no openings in May. Also added him to cancellation list. ?

## 2021-10-06 LAB — ECHOCARDIOGRAM COMPLETE
AR max vel: 1.52 cm2
AV Area VTI: 1.52 cm2
AV Area mean vel: 1.63 cm2
AV Mean grad: 11 mmHg
AV Peak grad: 19 mmHg
Ao pk vel: 2.18 m/s
Area-P 1/2: 2.76 cm2
MV M vel: 5.43 m/s
MV Peak grad: 117.9 mmHg
S' Lateral: 3.5 cm

## 2021-10-12 ENCOUNTER — Encounter: Payer: Self-pay | Admitting: *Deleted

## 2021-10-12 ENCOUNTER — Ambulatory Visit (HOSPITAL_COMMUNITY)
Admission: RE | Admit: 2021-10-12 | Discharge: 2021-10-12 | Disposition: A | Discharge status: Home / Self Care | Payer: Medicare HMO | Source: Ambulatory Visit | Attending: Gastroenterology | Admitting: Gastroenterology

## 2021-10-12 DIAGNOSIS — R188 Other ascites: Secondary | ICD-10-CM | POA: Diagnosis not present

## 2021-10-12 DIAGNOSIS — K7460 Unspecified cirrhosis of liver: Secondary | ICD-10-CM | POA: Insufficient documentation

## 2021-10-12 DIAGNOSIS — R932 Abnormal findings on diagnostic imaging of liver and biliary tract: Secondary | ICD-10-CM | POA: Diagnosis not present

## 2021-10-12 NOTE — Telephone Encounter (Signed)
Called pt. He has been scheduled for 6/1 at 7:30am. Aware will mail prep instructions and pre-op appt. ?

## 2021-10-24 ENCOUNTER — Encounter: Payer: Self-pay | Admitting: Family Medicine

## 2021-10-24 ENCOUNTER — Ambulatory Visit (INDEPENDENT_AMBULATORY_CARE_PROVIDER_SITE_OTHER): Payer: Medicare HMO | Admitting: Family Medicine

## 2021-10-24 VITALS — BP 138/70 | Temp 98.1°F | Wt 200.0 lb

## 2021-10-24 DIAGNOSIS — I714 Abdominal aortic aneurysm, without rupture, unspecified: Secondary | ICD-10-CM

## 2021-10-24 DIAGNOSIS — D649 Anemia, unspecified: Secondary | ICD-10-CM | POA: Diagnosis not present

## 2021-10-24 DIAGNOSIS — R188 Other ascites: Secondary | ICD-10-CM | POA: Diagnosis not present

## 2021-10-24 DIAGNOSIS — K7460 Unspecified cirrhosis of liver: Secondary | ICD-10-CM | POA: Diagnosis not present

## 2021-10-24 DIAGNOSIS — R600 Localized edema: Secondary | ICD-10-CM

## 2021-10-24 MED ORDER — ALBUTEROL SULFATE HFA 108 (90 BASE) MCG/ACT IN AERS: 2.0000 | INHALATION_SPRAY | Freq: Four times a day (QID) | RESPIRATORY_TRACT | 2 refills | Status: DC | PRN

## 2021-10-24 MED ORDER — NADOLOL 40 MG PO TABS
ORAL_TABLET | ORAL | 1 refills | Status: DC
Start: 2021-10-24 — End: 2022-04-17

## 2021-10-24 NOTE — Progress Notes (Signed)
? ?  Subjective:  ? ? Patient ID: Austin Morgan, male    DOB: 11/05/1955, 66 y.o.   MRN: [id number] ? ?HPI ?Pt here for follow up. Pt states his edema is still in both legs. Wife thinks edema has moved to face and abdominal area. ?Patient very frustrated by what is going on ?Relates he is having swelling in the leg swelling in the abdomen and sometimes feels puffy in the face ?Staying away from alcohol ?Trying to stay healthy with his eating habits ?Try to minimize starches as well as salt ?States new medications utilized to lessen the risk of esophageal bleeds is causing him fatigue and tiredness ?Gives out of energy with walking ?Dr.Rourk placed pt on new blood pressure med and pt has been lethargic since then. Goes back for endoscopy on June 1.  ? ? ?Review of Systems ? ?   ?Objective:  ? Physical Exam ? ?General-in no acute distress ?Eyes-no discharge ?Lungs-respiratory rate normal, CTA ?CV-no murmurs,RRR ?Extremities skin warm dry moderate edema ?Neuro grossly normal ?Behavior normal, alert ?Does have some abdominal swelling consistent with ascites ? ? ?   ?Assessment & Plan:  ?1. Anemia, unspecified type ?Check CBC patient was significantly anemic after esophageal bleed currently craving ice which increases likelihood of iron deficiency ?- CBC with Differential ?- Iron Binding Cap (TIBC)([facility]) ?- Ferritin ?- Hepatic function panel ?- Basic Metabolic Panel (BMET) ? ?2. Pedal edema ?More than likely related to low albumin cirrhosis and ascites.  Currently on furosemide.  Blood pressure on the lower end stop lisinopril.  May need to start spironolactone await lab work first ?- CBC with Differential ?- Iron Binding Cap (TIBC)([facility]) ?- Ferritin ?- Hepatic function panel ?- Basic Metabolic Panel (BMET) ? ?3. Cirrhosis of liver with ascites, unspecified hepatic cirrhosis type ([place]) ?Keep salt use to 2 g or less per day ?Patient with severe fatigue due to the naldolol reduce the dosage to a half a  tablet, 40 mg tablet take one half which would equal 20 mg ?- CBC with Differential ?- Iron Binding Cap (TIBC)([facility]) ?- Ferritin ?- Hepatic function panel ?- Basic Metabolic Panel (BMET) ? ?As for the abdominal aorta aneurysm I do not feel that is causing any problems for him I would recommend repeating the ultrasound in 2024 as previously mentioned on previous radiology form ?

## 2021-10-25 ENCOUNTER — Telehealth: Payer: Self-pay | Admitting: Family Medicine

## 2021-10-25 ENCOUNTER — Other Ambulatory Visit: Payer: Self-pay | Admitting: Family Medicine

## 2021-10-25 ENCOUNTER — Encounter: Payer: Self-pay | Admitting: Family Medicine

## 2021-10-25 DIAGNOSIS — I10 Essential (primary) hypertension: Secondary | ICD-10-CM

## 2021-10-25 LAB — CBC WITH DIFFERENTIAL/PLATELET
Basophils Absolute: 0 10*3/uL (ref 0.0–0.2)
Basos: 1 %
EOS (ABSOLUTE): 0.3 10*3/uL (ref 0.0–0.4)
Eos: 7 %
Hematocrit: 23.9 % — ABNORMAL LOW (ref 37.5–51.0)
Hemoglobin: 7.8 g/dL — ABNORMAL LOW (ref 13.0–17.7)
Immature Grans (Abs): 0 10*3/uL (ref 0.0–0.1)
Immature Granulocytes: 0 %
Lymphocytes Absolute: 1 10*3/uL (ref 0.7–3.1)
Lymphs: 21 %
MCH: 28.3 pg (ref 26.6–33.0)
MCHC: 32.6 g/dL (ref 31.5–35.7)
MCV: 87 fL (ref 79–97)
Monocytes Absolute: 0.4 10*3/uL (ref 0.1–0.9)
Monocytes: 9 %
Neutrophils Absolute: 2.8 10*3/uL (ref 1.4–7.0)
Neutrophils: 62 %
Platelets: 103 10*3/uL — ABNORMAL LOW (ref 150–450)
RBC: 2.76 x10E6/uL — ABNORMAL LOW (ref 4.14–5.80)
RDW: 14.5 % (ref 11.6–15.4)
WBC: 4.6 10*3/uL (ref 3.4–10.8)

## 2021-10-25 LAB — IRON AND TIBC
Iron Saturation: 8 % — CL (ref 15–55)
Iron: 31 ug/dL — ABNORMAL LOW (ref 38–169)
Total Iron Binding Capacity: 368 ug/dL (ref 250–450)
UIBC: 337 ug/dL (ref 111–343)

## 2021-10-25 LAB — BASIC METABOLIC PANEL
BUN/Creatinine Ratio: 13 (ref 10–24)
BUN: 34 mg/dL — ABNORMAL HIGH (ref 8–27)
CO2: 21 mmol/L (ref 20–29)
Calcium: 9 mg/dL (ref 8.6–10.2)
Chloride: 102 mmol/L (ref 96–106)
Creatinine, Ser: 2.53 mg/dL — ABNORMAL HIGH (ref 0.76–1.27)
Glucose: 68 mg/dL — ABNORMAL LOW (ref 70–99)
Potassium: 4.7 mmol/L (ref 3.5–5.2)
Sodium: 139 mmol/L (ref 134–144)
eGFR: 27 mL/min/{1.73_m2} — ABNORMAL LOW (ref >59)

## 2021-10-25 LAB — HEPATIC FUNCTION PANEL
ALT: 16 IU/L (ref 0–44)
AST: 25 IU/L (ref 0–40)
Albumin: 3.7 g/dL — ABNORMAL LOW (ref 3.8–4.8)
Alkaline Phosphatase: 111 IU/L (ref 44–121)
Bilirubin Total: 0.5 mg/dL (ref 0.0–1.2)
Bilirubin, Direct: 0.21 mg/dL (ref 0.00–0.40)
Total Protein: 7.2 g/dL (ref 6.0–8.5)

## 2021-10-25 LAB — FERRITIN: Ferritin: 25 ng/mL — ABNORMAL LOW (ref 30–400)

## 2021-10-25 NOTE — Telephone Encounter (Signed)
Please call patient/Rhonda his wife ?I did send a MyChart message ?Patient's creatinine significantly elevated ? ?Stop metformin ?Repeat metabolic 7 on Monday ?If significant drop in urine output or significant hypotension recommend ER visit for IV fluids and recheck of kidney function ?Patient was also supposed to stop lisinopril from yesterday's conversation ?Follow glucoses checking once or twice daily send [place] update by Monday ?If glucoses go above 400 recommend emergency department ?Hold off on Lasix through Sunday unless swelling gets severe if gets severe may reinitiate 1 every other day ? ?Also schedule patient to do follow-up visit midweek next week-Wednesday would be fine ? ?If any other troubles let us know thank you ?

## 2021-10-25 NOTE — Telephone Encounter (Signed)
Patient called and advised of provider's recommendations. Patient stated he did received the mychart message from Dr Nicki Reaper and verbalized understanding. Blood work ordered in [employer] and patient scheduled with Dr Nicki Reaper 11/01/21 at 9:20am for recheck. ?

## 2021-10-25 NOTE — Telephone Encounter (Signed)
Hi Austin Morgan and Austin Morgan ? ?I 100% agree I recommend stopping the metformin.  Also recommend holding Lasix for at least a couple days-I would recommend holding the Lasix through Sunday unless the swelling becomes severe ? I would recommend repeating the metabolic 7 by Monday ?Monitor sugar periodically may need other interventions depending on the readings ?Please send [place] an update on Monday regarding the readings ? ?There is also iron deficiency related to his recent blood loss.  Would be wise to be considering iron infusion with hematology if Austin Morgan is open to this.  Try to stay well-hydrated over the weekend.  If urinary output drops significantly I recommend going to the emergency department for recheck and IV fluids. ? ? ?I will talk with our nurses they will reach out to you with a phone call. ? ?Thanks-Dr. Nicki Reaper ? ?(As always I appreciate any communication from you all-for lab work typically when I get results I try to respond as soon as possible-as you are aware my chart will update lab results faster to the patient's then we has clinicians can handle but we tried to communicate back quickly as best as possible thank you) ?

## 2021-10-30 ENCOUNTER — Other Ambulatory Visit: Payer: Self-pay

## 2021-10-30 ENCOUNTER — Other Ambulatory Visit (HOSPITAL_COMMUNITY)
Admission: RE | Admit: 2021-10-30 | Discharge: 2021-10-30 | Disposition: A | Discharge status: Home / Self Care | Payer: Medicare HMO | Source: Ambulatory Visit | Attending: Family Medicine | Admitting: Family Medicine

## 2021-10-30 ENCOUNTER — Encounter (HOSPITAL_COMMUNITY): Payer: Self-pay | Admitting: *Deleted

## 2021-10-30 ENCOUNTER — Emergency Department (HOSPITAL_COMMUNITY): Payer: Medicare HMO

## 2021-10-30 ENCOUNTER — Other Ambulatory Visit: Payer: Self-pay | Admitting: Family Medicine

## 2021-10-30 ENCOUNTER — Encounter: Payer: Self-pay | Admitting: Family Medicine

## 2021-10-30 ENCOUNTER — Inpatient Hospital Stay (HOSPITAL_COMMUNITY)
Admission: EM | Admit: 2021-10-30 | Discharge: 2021-11-02 | DRG: 432 | Disposition: A | Discharge status: Home / Self Care | Payer: Medicare HMO | Attending: Family Medicine | Admitting: Family Medicine

## 2021-10-30 DIAGNOSIS — E8770 Fluid overload, unspecified: Secondary | ICD-10-CM | POA: Diagnosis not present

## 2021-10-30 DIAGNOSIS — R609 Edema, unspecified: Principal | ICD-10-CM

## 2021-10-30 DIAGNOSIS — N289 Disorder of kidney and ureter, unspecified: Secondary | ICD-10-CM

## 2021-10-30 DIAGNOSIS — K3189 Other diseases of stomach and duodenum: Secondary | ICD-10-CM | POA: Diagnosis present

## 2021-10-30 DIAGNOSIS — N179 Acute kidney failure, unspecified: Secondary | ICD-10-CM | POA: Diagnosis present

## 2021-10-30 DIAGNOSIS — K7031 Alcoholic cirrhosis of liver with ascites: Secondary | ICD-10-CM | POA: Diagnosis not present

## 2021-10-30 DIAGNOSIS — I10 Essential (primary) hypertension: Secondary | ICD-10-CM | POA: Diagnosis present

## 2021-10-30 DIAGNOSIS — J90 Pleural effusion, not elsewhere classified: Secondary | ICD-10-CM | POA: Diagnosis not present

## 2021-10-30 DIAGNOSIS — E119 Type 2 diabetes mellitus without complications: Secondary | ICD-10-CM

## 2021-10-30 DIAGNOSIS — D649 Anemia, unspecified: Secondary | ICD-10-CM

## 2021-10-30 DIAGNOSIS — R0602 Shortness of breath: Secondary | ICD-10-CM | POA: Diagnosis not present

## 2021-10-30 DIAGNOSIS — K219 Gastro-esophageal reflux disease without esophagitis: Secondary | ICD-10-CM | POA: Diagnosis present

## 2021-10-30 DIAGNOSIS — D539 Nutritional anemia, unspecified: Secondary | ICD-10-CM

## 2021-10-30 DIAGNOSIS — R1900 Intra-abdominal and pelvic swelling, mass and lump, unspecified site: Secondary | ICD-10-CM | POA: Insufficient documentation

## 2021-10-30 DIAGNOSIS — I509 Heart failure, unspecified: Secondary | ICD-10-CM

## 2021-10-30 DIAGNOSIS — K7460 Unspecified cirrhosis of liver: Secondary | ICD-10-CM | POA: Diagnosis present

## 2021-10-30 DIAGNOSIS — G2581 Restless legs syndrome: Secondary | ICD-10-CM | POA: Diagnosis present

## 2021-10-30 DIAGNOSIS — D61818 Other pancytopenia: Secondary | ICD-10-CM | POA: Diagnosis present

## 2021-10-30 DIAGNOSIS — K766 Portal hypertension: Secondary | ICD-10-CM | POA: Diagnosis present

## 2021-10-30 DIAGNOSIS — M109 Gout, unspecified: Secondary | ICD-10-CM | POA: Diagnosis present

## 2021-10-30 DIAGNOSIS — Z87891 Personal history of nicotine dependence: Secondary | ICD-10-CM

## 2021-10-30 DIAGNOSIS — I129 Hypertensive chronic kidney disease with stage 1 through stage 4 chronic kidney disease, or unspecified chronic kidney disease: Secondary | ICD-10-CM | POA: Diagnosis present

## 2021-10-30 DIAGNOSIS — N189 Chronic kidney disease, unspecified: Secondary | ICD-10-CM | POA: Diagnosis present

## 2021-10-30 DIAGNOSIS — J810 Acute pulmonary edema: Secondary | ICD-10-CM | POA: Diagnosis not present

## 2021-10-30 DIAGNOSIS — D62 Acute posthemorrhagic anemia: Secondary | ICD-10-CM | POA: Diagnosis present

## 2021-10-30 DIAGNOSIS — E785 Hyperlipidemia, unspecified: Secondary | ICD-10-CM | POA: Diagnosis present

## 2021-10-30 DIAGNOSIS — F32A Depression, unspecified: Secondary | ICD-10-CM | POA: Diagnosis present

## 2021-10-30 DIAGNOSIS — G4733 Obstructive sleep apnea (adult) (pediatric): Secondary | ICD-10-CM | POA: Diagnosis present

## 2021-10-30 DIAGNOSIS — F419 Anxiety disorder, unspecified: Secondary | ICD-10-CM | POA: Diagnosis present

## 2021-10-30 DIAGNOSIS — I8511 Secondary esophageal varices with bleeding: Secondary | ICD-10-CM | POA: Diagnosis present

## 2021-10-30 DIAGNOSIS — F1011 Alcohol abuse, in remission: Secondary | ICD-10-CM | POA: Diagnosis present

## 2021-10-30 DIAGNOSIS — N1832 Chronic kidney disease, stage 3b: Secondary | ICD-10-CM | POA: Diagnosis present

## 2021-10-30 DIAGNOSIS — Z885 Allergy status to narcotic agent status: Secondary | ICD-10-CM

## 2021-10-30 DIAGNOSIS — D631 Anemia in chronic kidney disease: Secondary | ICD-10-CM | POA: Diagnosis present

## 2021-10-30 DIAGNOSIS — R601 Generalized edema: Secondary | ICD-10-CM | POA: Diagnosis present

## 2021-10-30 DIAGNOSIS — E1122 Type 2 diabetes mellitus with diabetic chronic kidney disease: Secondary | ICD-10-CM | POA: Diagnosis present

## 2021-10-30 LAB — PROTIME-INR
INR: 1.3 — ABNORMAL HIGH (ref 0.8–1.2)
Prothrombin Time: 15.9 seconds — ABNORMAL HIGH (ref 11.4–15.2)

## 2021-10-30 LAB — COMPREHENSIVE METABOLIC PANEL
ALT: 21 U/L (ref 0–44)
AST: 27 U/L (ref 15–41)
Albumin: 3.4 g/dL — ABNORMAL LOW (ref 3.5–5.0)
Alkaline Phosphatase: 82 U/L (ref 38–126)
Anion gap: 4 — ABNORMAL LOW (ref 5–15)
BUN: 32 mg/dL — ABNORMAL HIGH (ref 8–23)
CO2: 24 mmol/L (ref 22–32)
Calcium: 9.1 mg/dL (ref 8.9–10.3)
Chloride: 107 mmol/L (ref 98–111)
Creatinine, Ser: 2.02 mg/dL — ABNORMAL HIGH (ref 0.61–1.24)
GFR, Estimated: 36 mL/min — ABNORMAL LOW (ref >60)
Glucose, Bld: 88 mg/dL (ref 70–99)
Potassium: 4.6 mmol/L (ref 3.5–5.1)
Sodium: 135 mmol/L (ref 135–145)
Total Bilirubin: 0.8 mg/dL (ref 0.3–1.2)
Total Protein: 7.5 g/dL (ref 6.5–8.1)

## 2021-10-30 LAB — CBC
HCT: 24.2 % — ABNORMAL LOW (ref 39.0–52.0)
Hemoglobin: 7.7 g/dL — ABNORMAL LOW (ref 13.0–17.0)
MCH: 28.5 pg (ref 26.0–34.0)
MCHC: 31.8 g/dL (ref 30.0–36.0)
MCV: 89.6 fL (ref 80.0–100.0)
Platelets: 112 10*3/uL — ABNORMAL LOW (ref 150–400)
RBC: 2.7 MIL/uL — ABNORMAL LOW (ref 4.22–5.81)
RDW: 15.7 % — ABNORMAL HIGH (ref 11.5–15.5)
WBC: 4.5 10*3/uL (ref 4.0–10.5)
nRBC: 0 % (ref 0.0–0.2)

## 2021-10-30 LAB — CBC WITH DIFFERENTIAL/PLATELET
Abs Immature Granulocytes: 0.01 10*3/uL (ref 0.00–0.07)
Basophils Absolute: 0 10*3/uL (ref 0.0–0.1)
Basophils Relative: 1 %
Eosinophils Absolute: 0.3 10*3/uL (ref 0.0–0.5)
Eosinophils Relative: 7 %
HCT: 24.8 % — ABNORMAL LOW (ref 39.0–52.0)
Hemoglobin: 7.5 g/dL — ABNORMAL LOW (ref 13.0–17.0)
Immature Granulocytes: 0 %
Lymphocytes Relative: 22 %
Lymphs Abs: 0.9 10*3/uL (ref 0.7–4.0)
MCH: 27.8 pg (ref 26.0–34.0)
MCHC: 30.2 g/dL (ref 30.0–36.0)
MCV: 91.9 fL (ref 80.0–100.0)
Monocytes Absolute: 0.4 10*3/uL (ref 0.1–1.0)
Monocytes Relative: 10 %
Neutro Abs: 2.6 10*3/uL (ref 1.7–7.7)
Neutrophils Relative %: 60 %
Platelets: 105 10*3/uL — ABNORMAL LOW (ref 150–400)
RBC: 2.7 MIL/uL — ABNORMAL LOW (ref 4.22–5.81)
RDW: 15.7 % — ABNORMAL HIGH (ref 11.5–15.5)
WBC: 4.3 10*3/uL (ref 4.0–10.5)
nRBC: 0 % (ref 0.0–0.2)

## 2021-10-30 LAB — TSH: TSH: 3.704 u[IU]/mL (ref 0.350–4.500)

## 2021-10-30 LAB — URINALYSIS, ROUTINE W REFLEX MICROSCOPIC
Bilirubin Urine: NEGATIVE
Glucose, UA: NEGATIVE mg/dL
Hgb urine dipstick: NEGATIVE
Ketones, ur: NEGATIVE mg/dL
Leukocytes,Ua: NEGATIVE
Nitrite: NEGATIVE
Protein, ur: NEGATIVE mg/dL
Specific Gravity, Urine: 1.004 — ABNORMAL LOW (ref 1.005–1.030)
pH: 6 (ref 5.0–8.0)

## 2021-10-30 LAB — BRAIN NATRIURETIC PEPTIDE: B Natriuretic Peptide: 709 pg/mL — ABNORMAL HIGH (ref 0.0–100.0)

## 2021-10-30 LAB — LIPASE, BLOOD: Lipase: 21 U/L (ref 11–51)

## 2021-10-30 LAB — MAGNESIUM: Magnesium: 1.8 mg/dL (ref 1.7–2.4)

## 2021-10-30 LAB — GLUCOSE, CAPILLARY: Glucose-Capillary: 87 mg/dL (ref 70–99)

## 2021-10-30 MED ORDER — ACETAMINOPHEN 650 MG RE SUPP: 650.0000 mg | Freq: Four times a day (QID) | RECTAL | Status: DC | PRN

## 2021-10-30 MED ORDER — OCTREOTIDE ACETATE 500 MCG/ML IJ SOLN
50.0000 ug/h | INTRAMUSCULAR | Status: DC
  Administered 2021-10-30 – 2021-10-31 (×2): 50 ug/h via INTRAVENOUS
  Filled 2021-10-30 (×5): qty 1

## 2021-10-30 MED ORDER — TRAZODONE HCL 50 MG PO TABS
100.0000 mg | ORAL_TABLET | Freq: Every day | ORAL | Status: DC
  Administered 2021-10-30 – 2021-11-01 (×3): 100 mg via ORAL
  Filled 2021-10-30 (×3): qty 2

## 2021-10-30 MED ORDER — ALBUTEROL SULFATE (2.5 MG/3ML) 0.083% IN NEBU: 3.0000 mL | INHALATION_SOLUTION | Freq: Four times a day (QID) | RESPIRATORY_TRACT | Status: DC | PRN

## 2021-10-30 MED ORDER — SODIUM CHLORIDE 0.9% FLUSH
3.0000 mL | Freq: Two times a day (BID) | INTRAVENOUS | Status: DC
  Administered 2021-10-30 – 2021-11-02 (×6): 3 mL via INTRAVENOUS

## 2021-10-30 MED ORDER — NADOLOL 20 MG PO TABS
20.0000 mg | ORAL_TABLET | Freq: Every day | ORAL | Status: DC
  Administered 2021-10-31 – 2021-11-01 (×2): 20 mg via ORAL
  Filled 2021-10-30 (×3): qty 1

## 2021-10-30 MED ORDER — OCTREOTIDE ACETATE 500 MCG/ML IJ SOLN
INTRAMUSCULAR | Status: AC
  Filled 2021-10-30: qty 1

## 2021-10-30 MED ORDER — FUROSEMIDE 10 MG/ML IJ SOLN
40.0000 mg | Freq: Once | INTRAMUSCULAR | Status: AC
  Administered 2021-10-30: 40 mg via INTRAVENOUS
  Filled 2021-10-30: qty 4

## 2021-10-30 MED ORDER — INSULIN ASPART 100 UNIT/ML IJ SOLN
0.0000 [IU] | Freq: Three times a day (TID) | INTRAMUSCULAR | Status: DC
  Administered 2021-10-31 – 2021-11-01 (×3): 1 [IU] via SUBCUTANEOUS
  Administered 2021-11-02: 3 [IU] via SUBCUTANEOUS
  Administered 2021-11-02: 1 [IU] via SUBCUTANEOUS

## 2021-10-30 MED ORDER — PANTOPRAZOLE SODIUM 40 MG IV SOLR
40.0000 mg | Freq: Two times a day (BID) | INTRAVENOUS | Status: DC
  Administered 2021-10-30 – 2021-11-02 (×6): 40 mg via INTRAVENOUS
  Filled 2021-10-30 (×6): qty 10

## 2021-10-30 MED ORDER — PRAMIPEXOLE DIHYDROCHLORIDE 1 MG PO TABS
0.5000 mg | ORAL_TABLET | Freq: Every day | ORAL | Status: DC
  Administered 2021-10-30 – 2021-11-01 (×3): 0.5 mg via ORAL
  Filled 2021-10-30 (×3): qty 1

## 2021-10-30 MED ORDER — OCTREOTIDE LOAD VIA INFUSION
50.0000 ug | Freq: Once | INTRAVENOUS | Status: AC
  Administered 2021-10-30: 50 ug via INTRAVENOUS
  Filled 2021-10-30: qty 25

## 2021-10-30 MED ORDER — ACETAMINOPHEN 325 MG PO TABS: 650.0000 mg | ORAL_TABLET | Freq: Four times a day (QID) | ORAL | Status: DC | PRN

## 2021-10-30 MED ORDER — SODIUM CHLORIDE 0.9% FLUSH: 3.0000 mL | INTRAVENOUS | Status: DC | PRN

## 2021-10-30 MED ORDER — SODIUM CHLORIDE 0.9 % IV SOLN: 250.0000 mL | INTRAVENOUS | Status: DC | PRN

## 2021-10-30 MED ORDER — SODIUM CHLORIDE 0.9 % IV SOLN
1.0000 g | Freq: Once | INTRAVENOUS | Status: AC
  Administered 2021-10-30: 1 g via INTRAVENOUS
  Filled 2021-10-30: qty 10

## 2021-10-30 NOTE — ED Provider Notes (Signed)
[facility] Provider Note   CSN: [id number] Arrival date & time: 10/30/21  1346     History {Add pertinent medical, surgical, social history, OB history to HPI:1} Chief Complaint  Patient presents with   abnormal labs    Minas Bonser is a 66 y.o. male.  Patient is a 66 year old male with past medical history of hypertension, hyperlipidemia, alcohol abuse, depression presenting after PCP told him to come to the emergency department for shortness of breath and elevated BNP.  Patient states he has been seeing his primary care physician as routine follow-up after ligation for anemia secondary to  The history is provided by the patient. No language interpreter was used.      Home Medications Prior to Admission medications   Medication Sig Start Date End Date Taking? Authorizing Provider  albuterol (VENTOLIN HFA) 108 (90 Base) MCG/ACT inhaler Inhale 2 puffs into the lungs every 6 (six) hours as needed for wheezing. 10/24/21   Kathyrn Drown, MD  Ascorbic Acid (VITAMIN C) 500 MG CAPS Take 1,000 mg by mouth daily. 09/21/21   Barton Dubois, MD  blood glucose meter kit and supplies KIT Dispense based on patient and insurance preference. Use up to BID as directed. 03/20/19   Nilda Simmer, NP  fish oil-omega-3 fatty acids 1000 MG capsule Take 1 g by mouth 2 (two) times daily.     [provider]  furosemide (LASIX) 20 MG tablet 1 tablet by mouth once in the morning as needed for edema 09/25/21   Kathyrn Drown, MD  glipiZIDE (GLUCOTROL) 5 MG tablet Take one tablet po BID Patient taking differently: Take 2.5 mg by mouth 2 (two) times daily before a meal. 05/10/21   Luking, Scott A, MD  insulin detemir (LEVEMIR FLEXTOUCH) 100 UNIT/ML FlexPen Inject 15 Units into the skin daily. 8 units qpm may titrate up to 40 units under direction of physician Patient taking differently: Inject 16 Units into the skin every evening. 09/21/21   Barton Dubois, MD  metFORMIN  (GLUCOPHAGE) 500 MG tablet Take one tablet po bid Patient taking differently: Take 500 mg by mouth 2 (two) times daily with a meal. 08/15/21   Luking, Elayne Snare, MD  Multiple Vitamins-Minerals (MULTIVITAMIN WITH MINERALS) tablet Take 1 tablet by mouth daily.    [provider]  nadolol (CORGARD) 40 MG tablet Take 1/2 (one half) tablet po daily 10/24/21   Luking, Elayne Snare, MD  naltrexone (DEPADE) 50 MG tablet TAKE ONE TABLET BY MOUTH ONCE DAILY. 01/02/21   Kathyrn Drown, MD  ONETOUCH ULTRA test strip USE AS DIRECTED TO TEST BLOOD GLUCOSE TWICE DAILY. 10/02/19   Kathyrn Drown, MD  pantoprazole (PROTONIX) 40 MG tablet Take 1 tablet (40 mg total) by mouth 2 (two) times daily. 09/25/21   Kathyrn Drown, MD  pramipexole (MIRAPEX) 0.25 MG tablet TAKE 1 TABLET BY MOUTH NIGHTLY AT  LEAST  2  HOURS  BEFORE  BEDTIME  FOR  RESTLESS  LEGS Patient taking differently: Take 0.5 mg by mouth at bedtime. TAKE 1 TABLET BY MOUTH NIGHTLY AT  LEAST  2  HOURS  BEFORE  BEDTIME  FOR  RESTLESS  LEGS 09/14/21   Kathyrn Drown, MD  sodium chloride (OCEAN) 0.65 % SOLN nasal spray Place 1 spray into both nostrils as needed for congestion.    [provider]  traZODone (DESYREL) 100 MG tablet Take 1 tablet (100 mg total) by mouth at bedtime as needed. for sleep 08/15/21  Kathyrn Drown, MD      Allergies    Codeine    Review of Systems   Review of Systems  Physical Exam Updated Vital Signs BP 132/73 (BP Location: Right Arm)   Pulse (!) 58   Temp 98.7 F (37.1 C) (Oral)   Resp 17   Ht 5' 7"  (1.702 m)   Wt 90 kg   SpO2 98%   BMI 31.08 kg/m  Physical Exam Vitals and nursing note reviewed.  Constitutional:      General: He is not in acute distress.    Appearance: He is well-developed.  HENT:     Head: Normocephalic and atraumatic.  Eyes:     Conjunctiva/sclera: Conjunctivae normal.  Cardiovascular:     Rate and Rhythm: Normal rate and regular rhythm.     Heart sounds: No murmur heard. Pulmonary:      Effort: Pulmonary effort is normal. No respiratory distress.     Breath sounds: Normal breath sounds.  Abdominal:     Palpations: Abdomen is soft.     Tenderness: There is no abdominal tenderness.  Musculoskeletal:        General: No swelling.     Cervical back: Neck supple.     Right lower leg: 4+ Pitting Edema present.     Left lower leg: 4+ Pitting Edema present.  Skin:    General: Skin is warm and dry.     Capillary Refill: Capillary refill takes less than 2 seconds.  Neurological:     Mental Status: He is alert.  Psychiatric:        Mood and Affect: Mood normal.    ED Results / Procedures / Treatments   Labs (all labs ordered are listed, but only abnormal results are displayed) Labs Reviewed - No data to display  EKG None  Radiology No results found.  Procedures Procedures  {Document cardiac monitor, telemetry assessment procedure when appropriate:1}  Medications Ordered in ED Medications - No data to display  ED Course/ Medical Decision Making/ A&P                           Medical Decision Making  Patient is a 66 year old male with past medical history of hypertension, hyperlipidemia, alcohol abuse, cirrhosis, depression presenting after PCP told him to come to the emergency department for shortness of breath and elevated BNP.  Patient is alert and oriented x3, no acute distress, afebrile, stable vital signs.  I independently interpreted patient's labs and EKG. External records reviewed and listed in detail below.  CHF exacerbation EKG demonstrates no ST segment elevation or depression.  Stable intervals.  Stable rate.  Patient has equal bilateral breath sounds with no adventitious lung sounds auscultated.  Patient has +4 bilateral pitting edema.  Laboratory studies concerning for congestive heart failure exacerbation with elevated BNP at 709.  No recent comparisons.  Chest x-ray demonstrates bilateral pleural effusions with pulmonary vascular congestion.   Questionable opacities however patient denies coughing.  No leukocytosis.  Doubt pneumonia.  IV Lasix 40 mg given.  Anemia Anemia present today.  Chart review demonstrates patient recently hospitalized for anemia after epistaxis and bleeding from the gums requiring blood transfusion.  Hemoglobin hemoglobin currently 7.5.  7.8 on 10/24/2021 approximately 6 days ago.  9.1 on discharge from hospital on 10/03/2021.  Chronic kidney disease Creatinine consistent with previous studies.  {Document critical care time when appropriate:1} {Document review of labs and clinical decision tools ie heart score,  Chads2Vasc2 etc:1}  {Document your independent review of radiology images, and any outside records:1} {Document your discussion with family members, caretakers, and with consultants:1} {Document social determinants of health affecting pt's care:1} {Document your decision making why or why not admission, treatments were needed:1} Final Clinical Impression(s) / ED Diagnoses Final diagnoses:  None    Rx / DC Orders ED Discharge Orders     None

## 2021-10-30 NOTE — ED Triage Notes (Signed)
Pt went to his PCP this am and had blood work done and was told to come to ED due to lab work; pt's lab work states his BNP is over 700; pt is c/o some sob but also has abdominal swelling  Pt c/o some diarrhea

## 2021-10-30 NOTE — Assessment & Plan Note (Addendum)
66 year old male with history of acute on CKD, anemia likely secondary to esophageal varices who presented with 1 week history of worsening LE edema, DOE, elevated BNP and CXR findings of volume overload as well as JVD, edema and crackles on exam -strict I/O -daily weights -has been off lasix/ACEI x 1 week due to acute on chronic renal failure and symptoms seemed to correlate with progression since stopping -just had echo done a few weeks ago with normal EF and normal DD. Mild AS -given IV lasix.  Nephrology recommended '40mg'$  BID.  With bump in creatinine we have reduced to once daily dosing for now. He will need outpatient nephrology follow up.  - now that he is dry, we will give a 3 day diuretic holiday and have him restart lasix on 11/06/21 at a lower dose of 20 mg daily.  He was advised to have labs rechecked with PCP in 1-2 weeks, CBC and BMP recommended.

## 2021-10-30 NOTE — H&P (Signed)
History and Physical    Patient: Austin Morgan XEN:[id number] DOB: 1955-12-25 DOA: 10/30/2021 DOS: the patient was seen and examined on 10/30/2021 PCP: Kathyrn Drown, MD  Patient coming from: Home - lives with his wife    Chief Complaint: "abnormal labs"   HPI: Austin Morgan is a 66 y.o. male with medical history significant of T2DM, depression, gout, HTN, HLD, OSA , CKD IIIa  , hepatic cirrhosis with varcies, hx of alcohol abuse (abstinent x 11 months), depression and anxiety, RLS who was sent to ED after PCP called and told him he had abnormal labs. Concerned due to elevated BNP with volume overload. He states his legs have progressively have gotten more edematous over the past 1+ week and he has had dyspnea on exertion. No shortness of breath at rest.   [facility]mitted to [facility] from 4/12-4/13 for acute anemia and acute on CKD. Transfused 3 units PRBC. He had EGD done on 09/28/21 by Dr. Gala Romney with findings of Esophageal varices with bleeding stigmata status post esophageal band ligation. Normal duodenal bulb and second portion of the duodenum. -Portal gastropathy/friable gastric mucosa no specimens collected. Gastric polyps not manipulated.  Last Monday he got acute on chronic renal failure. Creatinine peaked at 2.53. baseline appears to be around 1.3-1.4.   He was on lasix x 2 weeks. Metformin and lisinopril stopped and hctz. He has been off lasix x 1 week.   Denies any fever/chills, vision changes/headaches, chest pain or palpitations, cough, abdominal pain, N/V/D, dysuria. Wife states he had a cold about 4-5 weeks ago that took him a long time to get over. No other sick contacts.   He does not smoke or drink alcohol   ER Course:  vitals: afebrile, bp: 132/73, HR: 58, RR: 17, oxygen: 98%RA Pertinent labs: hgb: 7.5, BUN: 32, creatinine: 2.02, BNP: 709,  CXR: cardiomegaly, diffuse interstitial and patchy airspace opacities and small bilateral pleural effusions. Most suggestive of CHF with mild edema.  Infection can not be excluded.  In ED: given $RemoveB'40mg'HpAMbyCS$  of lasix. Nephrology consulted.    Review of Systems: As mentioned in the history of present illness. All other systems reviewed and are negative. Past Medical History:  Diagnosis Date   Anxiety    Arthritis    Depression    Diabetes mellitus without complication ([place])    ED (erectile dysfunction)    Gout    H/O ETOH abuse    Hyperlipidemia    Hypertension    Hypertriglyceridemia    PONV (postoperative nausea and vomiting)    Sleep apnea    Past Surgical History:  Procedure Laterality Date   CARPAL TUNNEL RELEASE Bilateral    COLONOSCOPY  2010   Dr. Delfin Edis: mild diverticulosis, next colonoscopy 2020   COLONOSCOPY WITH PROPOFOL N/A 01/21/2020   Rourk: 9 polyps removed, multiple tubular adenomas.  Next colonoscopy in 3 years.   ELBOW SURGERY     ESOPHAGEAL BANDING  09/28/2021   Procedure: ESOPHAGEAL BANDING;  Surgeon: [name], MD;  Location: [facility];  Service: Endoscopy;;   ESOPHAGOGASTRODUODENOSCOPY (EGD) WITH PROPOFOL N/A 01/21/2020   [facility]k: normal   ESOPHAGOGASTRODUODENOSCOPY (EGD) WITH PROPOFOL N/A 09/28/2021   Procedure: ESOPHAGOGASTRODUODENOSCOPY (EGD) WITH PROPOFOL;  Surgeon: [name], MD;  Location: [facility];  Service: Endoscopy;  Laterality: N/A;  3:00pm   NECK SURGERY     c4   POLYPECTOMY  01/21/2020   Procedure: POLYPECTOMY;  Surgeon: [name], MD;  Location: [facility];  Service: Endoscopy;;  colon   Social History:  reports that he quit smoking about 5 years ago. His smoking use included cigarettes. He has a 36.00 pack-year smoking history. He has never used smokeless tobacco. He reports that he does not currently use alcohol. He reports that he does not use drugs.  Allergies  Allergen Reactions   Codeine Nausea And Vomiting    Family History  Problem Relation Age of Onset   Heart attack Father    Colon cancer Neg Hx     Prior to Admission medications   Medication  Sig Start Date End Date Taking? Authorizing Provider  albuterol (VENTOLIN HFA) 108 (90 Base) MCG/ACT inhaler Inhale 2 puffs into the lungs every 6 (six) hours as needed for wheezing. 10/24/21   Kathyrn Drown, MD  Ascorbic Acid (VITAMIN C) 500 MG CAPS Take 1,000 mg by mouth daily. 09/21/21   Barton Dubois, MD  blood glucose meter kit and supplies KIT Dispense based on patient and insurance preference. Use up to BID as directed. 03/20/19   Nilda Simmer, NP  fish oil-omega-3 fatty acids 1000 MG capsule Take 1 g by mouth 2 (two) times daily.     [provider]  furosemide (LASIX) 20 MG tablet 1 tablet by mouth once in the morning as needed for edema 09/25/21   Kathyrn Drown, MD  glipiZIDE (GLUCOTROL) 5 MG tablet Take one tablet po BID Patient taking differently: Take 2.5 mg by mouth 2 (two) times daily before a meal. 05/10/21   Luking, Scott A, MD  insulin detemir (LEVEMIR FLEXTOUCH) 100 UNIT/ML FlexPen Inject 15 Units into the skin daily. 8 units qpm may titrate up to 40 units under direction of physician Patient taking differently: Inject 16 Units into the skin every evening. 09/21/21   Barton Dubois, MD  metFORMIN (GLUCOPHAGE) 500 MG tablet Take one tablet po bid Patient taking differently: Take 500 mg by mouth 2 (two) times daily with a meal. 08/15/21   Luking, Elayne Snare, MD  Multiple Vitamins-Minerals (MULTIVITAMIN WITH MINERALS) tablet Take 1 tablet by mouth daily.    [provider]  nadolol (CORGARD) 40 MG tablet Take 1/2 (one half) tablet po daily 10/24/21   Luking, Elayne Snare, MD  naltrexone (DEPADE) 50 MG tablet TAKE ONE TABLET BY MOUTH ONCE DAILY. 01/02/21   Kathyrn Drown, MD  ONETOUCH ULTRA test strip USE AS DIRECTED TO TEST BLOOD GLUCOSE TWICE DAILY. 10/02/19   Kathyrn Drown, MD  pantoprazole (PROTONIX) 40 MG tablet Take 1 tablet (40 mg total) by mouth 2 (two) times daily. 09/25/21   Kathyrn Drown, MD  pramipexole (MIRAPEX) 0.25 MG tablet TAKE 1 TABLET BY MOUTH NIGHTLY  AT  LEAST  2  HOURS  BEFORE  BEDTIME  FOR  RESTLESS  LEGS Patient taking differently: Take 0.5 mg by mouth at bedtime. TAKE 1 TABLET BY MOUTH NIGHTLY AT  LEAST  2  HOURS  BEFORE  BEDTIME  FOR  RESTLESS  LEGS 09/14/21   Kathyrn Drown, MD  sodium chloride (OCEAN) 0.65 % SOLN nasal spray Place 1 spray into both nostrils as needed for congestion.    [provider]  traZODone (DESYREL) 100 MG tablet Take 1 tablet (100 mg total) by mouth at bedtime as needed. for sleep 08/15/21   Kathyrn Drown, MD    Physical Exam: Vitals:   10/30/21 1900 10/30/21 1944 10/30/21 2000 10/30/21 2004  BP: 130/76 131/81 (!) 150/73   Pulse: 62 62 64 65  Resp:  _0 Temp:    98.7 F (37.1 C)  TempSrc:      SpO2: 97% 99% 98% 96%  Weight:      Height:       General:  Appears calm and comfortable and is in NAD Eyes:  PERRL, EOMI, normal lids, iris ENT:  grossly normal hearing, lips & tongue, mmm; appropriate dentition Neck:  no LAD, masses or thyromegaly; no carotid bruits, +JVD Cardiovascular:  RRR, +systolic murmur. 3+ pitting edema to above the knees bilaterally  Respiratory:   crackles in bases, moving air well.  Normal respiratory effort. Abdomen:  soft, NT, ND, NABS Back:   normal alignment, no CVAT Skin:  no rash or induration seen on limited exam Musculoskeletal:  grossly normal tone BUE/BLE, good ROM, no bony abnormality Lower extremity:   Limited foot exam with no ulcerations.  2+ distal pulses. Psychiatric:  grossly normal mood and affect, speech fluent and appropriate, AOx3 Neurologic:  CN 2-12 grossly intact, moves all extremities in coordinated fashion, sensation intact   Radiological Exams on Admission: Independently reviewed - see discussion in A/P where applicable  DG Chest 2 View  Result Date: 10/30/2021 CLINICAL DATA:  swelling EXAM: CHEST - 2 VIEW COMPARISON:  None Available. FINDINGS: Enlargement of the cardiac silhouette. Diffuse interstitial and patchy airspace  opacities. Small bilateral pleural effusions. No visible pneumothorax. ACDF. IMPRESSION: Cardiomegaly, diffuse interstitial and patchy airspace opacities, and small bilateral pleural effusions. Findings are most suggestive of congestive heart failure with mild edema. Infection is not excluded. Electronically Signed   By: Margaretha Sheffield M.D.   On: 10/30/2021 16:21    EKG: Independently reviewed.  NSR with rate 63; nonspecific ST changes with no evidence of acute ischemia   Labs on Admission: I have personally reviewed the available labs and imaging studies at the time of the admission.  Pertinent labs:   hgb: 7.5 BUN: 32  creatinine: 2.02,  BNP: 709,   Assessment and Plan: Principal Problem:   Acute exacerbation of CHF (congestive heart failure) (HCC) Active Problems:   Symptomatic anemia   Acute kidney injury superimposed on chronic kidney disease (HCC)   Hepatic cirrhosis with esophageal varices   Diabetes mellitus without complication (HCC)   GERD (gastroesophageal reflux disease)   Obstructive sleep apnea    Assessment and Plan: * Acute exacerbation of CHF (congestive heart failure) ([place]) 66 year old male with history of acute on CKD, anemia likely secondary to esophageal varices who presented with 1 week history of worsening LE edema, DOE, elevated BNP and CXR findings of volume overload as well as JVD, edema and crackles on exam consistent with CHF exacerbation -observation to telemetry -strict I/O -daily weights -has been off lasix/ACEI x 1 week due to acute on chronic renal failure and symptoms seemed to correlate with progression since stopping -just had echo done a few weeks ago with normal EF and normal DD. Mild AS -had a cold a few weeks ago, will check RVP -given IV lasix 68m x1 in ED with over 1200cc ouput thus far. Nephrology recommended 486mBID. Wife is a little hesitent with poor renal function after lasix outpatient. Will see how he does with the 4037monight,  f/u on renal function and output tomorrow and adjust to BID if needed. He is quite fluid overloaded, so will likely need BID if kidneys will tolerate   Symptomatic anemia Admitted on 09/20/21 for symptomatic anemia with hgb down to 5.4 s/p 3 units PRBC hgb today  is 7.5, transfuse if <7, serial cbc. Stat type and cross. Would give dose of lasix s/p transfusion if needed. hgb was 9.1 three weeks ago Esophageal varices banded on EGD on 09/28/21 He has no signs of active bleeding at this time Will check fecal occult and monitor serial CBC q 6 hours Clear liquid diet and NPO after midnight GI consulted Start octreotide gtt until varices ruled out PPI IV q 12 hours   Acute kidney injury superimposed on chronic kidney disease ([place]) -stage 3a at baseline (1.3-1.4)  -had acute on chronic renal failure 2 weeks ago with peak creatinine of 2.53 after 2 weeks of lasix +nephrotoxic agents. Metformin, hctz, lisinopril all stopped. He has been off lasix x 1 week -worsening anemia likely also contributing  -holding nephrotoxic agents -strict I/O -UA  -trend -ED discussed with nephrology who recommended 107m IV lasix BID. He received 427mtonight. Will see how his creatinine responds, his output and day team can adjust to BID if indicated.   Hepatic cirrhosis with esophageal varices Abstinent from alcohol for 11 months  S/p varice banding on 09/28/21 Octreotide gtt protonix bid Dose of rocephin for SBP prophylaxis GI consulted and will follow   Diabetes mellitus without complication (HCC) A1D2Rf 6.0 in 09/2021 Hold oral agent (glipizide) Holding long acting insulin with worsening renal function and liquid diet. BS running on lower end SSI and accuchecks per protocol for now   GERD (gastroesophageal reflux disease) protonix IV BID     Advance Care Planning:   Code Status: Full Code   Consults: nephrology discussed with EDP and GI-Dr. CaAbbey Chatters DVT Prophylaxis: TED  Family Communication: wife  at bedside, rhonda [place]ham  Severity of Illness: The appropriate patient status for this patient is OBSERVATION. Observation status is judged to be reasonable and necessary in order to provide the required intensity of service to ensure the patient's safety. The patient's presenting symptoms, physical exam findings, and initial radiographic and laboratory data in the context of their medical condition is felt to place them at decreased risk for further clinical deterioration. Furthermore, it is anticipated that the patient will be medically stable for discharge from the hospital within 2 midnights of admission.   Author: AlOrma FlamingMD 10/30/2021 8:49 PM  For on call review [email or web address]

## 2021-10-30 NOTE — Assessment & Plan Note (Addendum)
Abstinent from alcohol for 11 months  S/p varice banding on 09/28/21 Octreotide gtt protonix bid Dose of rocephin for SBP prophylaxis GI consulted and will follow

## 2021-10-30 NOTE — Assessment & Plan Note (Signed)
protonix IV BID was given in hospital

## 2021-10-30 NOTE — Assessment & Plan Note (Addendum)
Admitted on 09/20/21 for symptomatic anemia with hgb down to 5.4 s/p 3 units PRBC hgb today is 7.5, transfuse if <7, serial cbc. Stat type and cross. Would give dose of lasix s/p transfusion if needed. hgb was 9.1 three weeks ago Esophageal varices banded on EGD on 09/28/21 He has no signs of active bleeding at this time Will check fecal occult and monitor serial CBC q 6 hours Clear liquid diet and NPO after midnight GI consulted Start octreotide gtt until varices ruled out PPI IV q 12 hours

## 2021-10-30 NOTE — Telephone Encounter (Signed)
Pt contacted and verbalized understanding.  

## 2021-10-30 NOTE — Assessment & Plan Note (Addendum)
-  had acute on chronic renal failure 2 weeks ago with peak creatinine of 2.53 after 2 weeks of lasix which he was holding prior to admission -worsening anemia likely also contributing  -he was given IV iron infusion on 5/24 to improve anemia -holding nephrotoxic agents -planning to give diuretic holiday for 3 days, resume lasix 20 mg daily on 11/06/21 - ambulatory referral made to [facility] [place] [facility] Dr. Theador Hawthorne for ongoing surveillance - Pt advised to see PCP to have labs rechecked in 1-2 weeks. Please check CBC and BMP in 1-2 weeks.

## 2021-10-30 NOTE — ED Provider Triage Note (Cosign Needed Addendum)
Emergency Medicine Provider Triage Evaluation Note  Austin Morgan , a 66 y.o. male  was evaluated in triage.  Pt complains of who presents with edema. Hx of cirrhosis, renal insuffieciency and recent GI bleed. Patient sent in by his pcp due to eleveated BNP. + SOB AND JVD  LABS IN CHART FROM 10:51 AM  Review of Systems  Positive: swelling Negative: sob  Physical Exam  BP 132/73 (BP Location: Right Arm)   Pulse (!) 58   Temp 98.7 F (37.1 C) (Oral)   Resp 17   Ht '5\' 7"'$  (1.702 m)   Wt 90 kg   SpO2 98%   BMI 31.08 kg/m  Gen:   Awake, no distress   Resp:  Normal effort  MSK:   Moves extremities without difficulty Other:  Pale. + pitting edema'  Medical Decision Making  Medically screening exam initiated at 3:56 PM.  Appropriate orders placed.  Elam Ellis was informed that the remainder of the evaluation will be completed by another provider, this initial triage assessment does not replace that evaluation, and the importance of remaining in the ED until their evaluation is complete.  Work up [employer].    Margarita Mail, PA-C 10/30/21 [place], [place], PA-C 10/30/21 [place], Blanca, PA-C 10/30/21 1604

## 2021-10-30 NOTE — Assessment & Plan Note (Addendum)
A1C of 6.0 in 09/2021 Hold oral agent (glipizide) Holding long acting insulin with worsening renal function and liquid diet. BS running on lower end SSI and accuchecks per protocol for now

## 2021-10-31 ENCOUNTER — Inpatient Hospital Stay (HOSPITAL_COMMUNITY): Payer: Medicare HMO | Admitting: Anesthesiology

## 2021-10-31 ENCOUNTER — Other Ambulatory Visit: Payer: Self-pay

## 2021-10-31 ENCOUNTER — Encounter (HOSPITAL_COMMUNITY)
Admission: EM | Disposition: A | Discharge status: Home / Self Care | Payer: Self-pay | Source: Home / Self Care | Attending: Family Medicine

## 2021-10-31 ENCOUNTER — Encounter (HOSPITAL_COMMUNITY): Payer: Self-pay | Admitting: Family Medicine

## 2021-10-31 DIAGNOSIS — I10 Essential (primary) hypertension: Secondary | ICD-10-CM | POA: Diagnosis not present

## 2021-10-31 DIAGNOSIS — D649 Anemia, unspecified: Secondary | ICD-10-CM

## 2021-10-31 DIAGNOSIS — I8511 Secondary esophageal varices with bleeding: Secondary | ICD-10-CM | POA: Diagnosis not present

## 2021-10-31 DIAGNOSIS — N179 Acute kidney failure, unspecified: Secondary | ICD-10-CM

## 2021-10-31 DIAGNOSIS — D631 Anemia in chronic kidney disease: Secondary | ICD-10-CM | POA: Diagnosis not present

## 2021-10-31 DIAGNOSIS — I509 Heart failure, unspecified: Secondary | ICD-10-CM

## 2021-10-31 DIAGNOSIS — G2581 Restless legs syndrome: Secondary | ICD-10-CM | POA: Diagnosis not present

## 2021-10-31 DIAGNOSIS — F32A Depression, unspecified: Secondary | ICD-10-CM | POA: Diagnosis not present

## 2021-10-31 DIAGNOSIS — K920 Hematemesis: Secondary | ICD-10-CM

## 2021-10-31 DIAGNOSIS — I8500 Esophageal varices without bleeding: Secondary | ICD-10-CM

## 2021-10-31 DIAGNOSIS — K7460 Unspecified cirrhosis of liver: Secondary | ICD-10-CM

## 2021-10-31 DIAGNOSIS — D509 Iron deficiency anemia, unspecified: Secondary | ICD-10-CM

## 2021-10-31 DIAGNOSIS — K766 Portal hypertension: Secondary | ICD-10-CM

## 2021-10-31 DIAGNOSIS — R69 Illness, unspecified: Secondary | ICD-10-CM | POA: Diagnosis not present

## 2021-10-31 DIAGNOSIS — R601 Generalized edema: Secondary | ICD-10-CM

## 2021-10-31 DIAGNOSIS — E785 Hyperlipidemia, unspecified: Secondary | ICD-10-CM | POA: Diagnosis not present

## 2021-10-31 DIAGNOSIS — D62 Acute posthemorrhagic anemia: Secondary | ICD-10-CM | POA: Diagnosis not present

## 2021-10-31 DIAGNOSIS — R188 Other ascites: Secondary | ICD-10-CM | POA: Diagnosis not present

## 2021-10-31 DIAGNOSIS — K7030 Alcoholic cirrhosis of liver without ascites: Secondary | ICD-10-CM

## 2021-10-31 DIAGNOSIS — I8510 Secondary esophageal varices without bleeding: Secondary | ICD-10-CM

## 2021-10-31 DIAGNOSIS — K3189 Other diseases of stomach and duodenum: Secondary | ICD-10-CM

## 2021-10-31 DIAGNOSIS — N1832 Chronic kidney disease, stage 3b: Secondary | ICD-10-CM | POA: Diagnosis not present

## 2021-10-31 DIAGNOSIS — K219 Gastro-esophageal reflux disease without esophagitis: Secondary | ICD-10-CM | POA: Diagnosis not present

## 2021-10-31 DIAGNOSIS — K7031 Alcoholic cirrhosis of liver with ascites: Secondary | ICD-10-CM | POA: Diagnosis not present

## 2021-10-31 DIAGNOSIS — N189 Chronic kidney disease, unspecified: Secondary | ICD-10-CM | POA: Diagnosis not present

## 2021-10-31 DIAGNOSIS — E119 Type 2 diabetes mellitus without complications: Secondary | ICD-10-CM | POA: Diagnosis not present

## 2021-10-31 DIAGNOSIS — D61818 Other pancytopenia: Secondary | ICD-10-CM | POA: Diagnosis not present

## 2021-10-31 DIAGNOSIS — E1122 Type 2 diabetes mellitus with diabetic chronic kidney disease: Secondary | ICD-10-CM | POA: Diagnosis not present

## 2021-10-31 DIAGNOSIS — F419 Anxiety disorder, unspecified: Secondary | ICD-10-CM | POA: Diagnosis not present

## 2021-10-31 DIAGNOSIS — F1011 Alcohol abuse, in remission: Secondary | ICD-10-CM | POA: Diagnosis not present

## 2021-10-31 DIAGNOSIS — Z885 Allergy status to narcotic agent status: Secondary | ICD-10-CM | POA: Diagnosis not present

## 2021-10-31 DIAGNOSIS — G4733 Obstructive sleep apnea (adult) (pediatric): Secondary | ICD-10-CM | POA: Diagnosis not present

## 2021-10-31 DIAGNOSIS — E8770 Fluid overload, unspecified: Secondary | ICD-10-CM | POA: Diagnosis not present

## 2021-10-31 DIAGNOSIS — Z87891 Personal history of nicotine dependence: Secondary | ICD-10-CM | POA: Diagnosis not present

## 2021-10-31 DIAGNOSIS — I129 Hypertensive chronic kidney disease with stage 1 through stage 4 chronic kidney disease, or unspecified chronic kidney disease: Secondary | ICD-10-CM | POA: Diagnosis not present

## 2021-10-31 HISTORY — PX: ESOPHAGOGASTRODUODENOSCOPY (EGD) WITH PROPOFOL: SHX5813

## 2021-10-31 LAB — CBC
HCT: 22.5 % — ABNORMAL LOW (ref 39.0–52.0)
HCT: 22.6 % — ABNORMAL LOW (ref 39.0–52.0)
Hemoglobin: 7.1 g/dL — ABNORMAL LOW (ref 13.0–17.0)
Hemoglobin: 7 g/dL — ABNORMAL LOW (ref 13.0–17.0)
MCH: 28.6 pg (ref 26.0–34.0)
MCH: 28 pg (ref 26.0–34.0)
MCHC: 31.6 g/dL (ref 30.0–36.0)
MCHC: 31 g/dL (ref 30.0–36.0)
MCV: 90.7 fL (ref 80.0–100.0)
MCV: 90.4 fL (ref 80.0–100.0)
Platelets: 96 10*3/uL — ABNORMAL LOW (ref 150–400)
Platelets: 98 10*3/uL — ABNORMAL LOW (ref 150–400)
RBC: 2.48 MIL/uL — ABNORMAL LOW (ref 4.22–5.81)
RBC: 2.5 MIL/uL — ABNORMAL LOW (ref 4.22–5.81)
RDW: 15.7 % — ABNORMAL HIGH (ref 11.5–15.5)
RDW: 15.8 % — ABNORMAL HIGH (ref 11.5–15.5)
WBC: 4.2 10*3/uL (ref 4.0–10.5)
WBC: 3.7 10*3/uL — ABNORMAL LOW (ref 4.0–10.5)
nRBC: 0 % (ref 0.0–0.2)
nRBC: 0 % (ref 0.0–0.2)

## 2021-10-31 LAB — COMPREHENSIVE METABOLIC PANEL
ALT: 19 U/L (ref 0–44)
AST: 28 U/L (ref 15–41)
Albumin: 3.2 g/dL — ABNORMAL LOW (ref 3.5–5.0)
Alkaline Phosphatase: 80 U/L (ref 38–126)
Anion gap: 5 (ref 5–15)
BUN: 29 mg/dL — ABNORMAL HIGH (ref 8–23)
CO2: 23 mmol/L (ref 22–32)
Calcium: 8.7 mg/dL — ABNORMAL LOW (ref 8.9–10.3)
Chloride: 107 mmol/L (ref 98–111)
Creatinine, Ser: 1.99 mg/dL — ABNORMAL HIGH (ref 0.61–1.24)
GFR, Estimated: 37 mL/min — ABNORMAL LOW (ref >60)
Glucose, Bld: 80 mg/dL (ref 70–99)
Potassium: 4.1 mmol/L (ref 3.5–5.1)
Sodium: 135 mmol/L (ref 135–145)
Total Bilirubin: 0.6 mg/dL (ref 0.3–1.2)
Total Protein: 6.9 g/dL (ref 6.5–8.1)

## 2021-10-31 LAB — RESPIRATORY PANEL BY PCR

## 2021-10-31 LAB — GLUCOSE, CAPILLARY
Glucose-Capillary: 82 mg/dL (ref 70–99)
Glucose-Capillary: 88 mg/dL (ref 70–99)
Glucose-Capillary: 80 mg/dL (ref 70–99)
Glucose-Capillary: 146 mg/dL — ABNORMAL HIGH (ref 70–99)
Glucose-Capillary: 128 mg/dL — ABNORMAL HIGH (ref 70–99)

## 2021-10-31 LAB — PREPARE RBC (CROSSMATCH)

## 2021-10-31 LAB — HEMOGLOBIN AND HEMATOCRIT, BLOOD
HCT: 25.4 % — ABNORMAL LOW (ref 39.0–52.0)
Hemoglobin: 8.1 g/dL — ABNORMAL LOW (ref 13.0–17.0)

## 2021-10-31 SURGERY — ESOPHAGOGASTRODUODENOSCOPY (EGD) WITH PROPOFOL
Anesthesia: General

## 2021-10-31 MED ORDER — SODIUM CHLORIDE 0.9 % IV SOLN: 1.0000 g | INTRAVENOUS | Status: DC

## 2021-10-31 MED ORDER — FUROSEMIDE 10 MG/ML IJ SOLN
40.0000 mg | Freq: Two times a day (BID) | INTRAMUSCULAR | Status: DC
Start: 2021-11-01 — End: 2021-11-01
  Administered 2021-11-01: 40 mg via INTRAVENOUS
  Filled 2021-10-31: qty 4

## 2021-10-31 MED ORDER — NADOLOL 40 MG PO TABS
ORAL_TABLET | ORAL | Status: AC
  Filled 2021-10-31: qty 1

## 2021-10-31 MED ORDER — LACTATED RINGERS IV SOLN: INTRAVENOUS | Status: DC

## 2021-10-31 MED ORDER — LIDOCAINE HCL (CARDIAC) PF 100 MG/5ML IV SOSY
PREFILLED_SYRINGE | INTRAVENOUS | Status: DC | PRN
  Administered 2021-10-31: 50 mg via INTRAVENOUS

## 2021-10-31 MED ORDER — FUROSEMIDE 10 MG/ML IJ SOLN
40.0000 mg | Freq: Once | INTRAMUSCULAR | Status: AC
  Administered 2021-10-31: 40 mg via INTRAVENOUS
  Filled 2021-10-31: qty 4

## 2021-10-31 MED ORDER — SODIUM CHLORIDE 0.9% IV SOLUTION: Freq: Once | INTRAVENOUS | Status: AC

## 2021-10-31 MED ORDER — PROPOFOL 10 MG/ML IV BOLUS
INTRAVENOUS | Status: DC | PRN
  Administered 2021-10-31: 20 mg via INTRAVENOUS
  Administered 2021-10-31: 50 mg via INTRAVENOUS
  Administered 2021-10-31: 20 mg via INTRAVENOUS
  Administered 2021-10-31: 100 mg via INTRAVENOUS
  Administered 2021-10-31: 10 mg via INTRAVENOUS

## 2021-10-31 NOTE — Brief Op Note (Signed)
10/30/2021 - 10/31/2021  3:41 PM  PATIENT:  Austin Morgan  66 y.o. male  PRE-OPERATIVE DIAGNOSIS:  acute on chronic anemia, cirrhosis with known esophageal varices s/p banding on 09/28/21  POST-OPERATIVE DIAGNOSIS:  portal hypertensive gastropathy, esopaheal varices grade 1  PROCEDURE:  Procedure(s): ESOPHAGOGASTRODUODENOSCOPY (EGD) WITH PROPOFOL (N/A)  SURGEON:  Surgeon(s) and Role:    * Harvel Quale, MD - Primary  Patient underwent EGD under propofol sedation.  Tolerated the procedure adequately.  Esophagus showed Grade I varices were found in the lower third of the esophagus. There was presence of a single subtle linear area which raised the concern for possible stigmata of bleeding. However, given the size of the varices, no bands could be deployed despite multiple attempts to perform this. Portal hypertensive gastropathy was found in the entire examined stomach. The examined duodenum was normal.   RECOMMENDATIONS - Return patient to hospital ward for ongoing care.  - Clear liquid diet.  - Repeat EGD in 6 months for surveillance of varices. - Stop octreotide and ceftriaxon - Monitor H/H every day - C/w pantoprazole '40mg'$  BID  Maylon Peppers, MD Gastroenterology and Hepatology [facility]

## 2021-10-31 NOTE — Hospital Course (Addendum)
Austin Morgan is a 66 y.o. male with medical history significant for type 2 diabetes, depression, gout, hypertension, hyperlipidemia, obstructive sleep apnea, CKD stage IIIa, hepatic cirrhosis with varices, history of alcohol abuse abstinent for 11 months, depression and anxiety, restless leg syndrome presented to hospital for abnormal labs and volume overload with progressive lower extremity edema and dyspnea on exertion.  Of note patient was admitted at [facility] between 412 and 413 for acute anemia and acute on chronic kidney disease and had received 3 units of packed RBC at that time.  Had EGD done 09/28/2021, by Dr. Gala Romney with findings of Esophageal varices with bleeding stigmata status post esophageal band ligation. Normal duodenal bulb and second portion of the duodenum. Portal gastropathy/friable gastric mucosa no specimens collected. Gastric polyps not manipulated.  Patient had outpatient labs with creatinine elevation at 2.5 and HCTZ and lisinopril on hold.  Baseline creatinine is around 1.3-1.4.  In the ED patient had stable vitals.  Hemoglobin was 7.5 with creatinine of 2.02, BNP at 709.  Chest x-ray showed cardiomegaly with diffuse interstitial and patchy airspace opacities and bilateral small pleural effusion.  In the ED, patient was given 40 of Lasix, nephrology was consulted and patient was admitted to hospital for further evaluation and treatment.  Assessment and Plan:  Principal Problem:   Acute exacerbation of CHF (congestive heart failure) (HCC) Active Problems:   Symptomatic anemia   Acute kidney injury superimposed on chronic kidney disease (HCC)   Hepatic cirrhosis with esophageal varices   Diabetes mellitus without complication (HCC)   GERD (gastroesophageal reflux disease)   Obstructive sleep apnea   * Acute exacerbation of CHF (congestive heart failure) ([place]) Patient had signs of volume overload on presentation with elevated JVD edema and crackles on exam with chest  x-ray/stable condition.  Off Lasix and ACE inhibitor for 1 week due to AKI.  Recent 2D echocardiogram with normal EF.  Patient was given Lasix 40 in the ED and nephrology from the ED had recommended for Lasix twice daily.  We will continue to monitor closely.  Patient will receive 1 unit of packed RBC and will be given 40 mg of Lasix after PRBC.  We will add 40 mg twice daily of Lasix starting on 11/01/2021.  Feels better after Lasix.  Symptomatic anemia Had received 3 units of packed RBC for hemoglobin of 5.4 on last admission.  Hemoglobin of 7.0 today.  Will transfuse 1 unit of packed RBC.  Esophageal varices banded on EGD on 09/28/21.  No signs of bleeding at this time.  On octreotide drip and PPI.  GI has been consulted planning for endoscopic evaluation..  Patient denies any gross hematemesis or melena.   Acute kidney injury superimposed on chronic kidney disease stage IIIa Creatinine around 1.3-1.4.  Metformin HCTZ and lisinopril on hold.  Continue intake and output charting Daily weights.  Continue Lasix '40mg'$  twice daily as per nephrology recommendations but has received 1 dose of IV Lasix yesterday.  Creatinine improved to 1.9 today.  Will receive 1 dose of IV Lasix after blood transfusion today and will resume twice daily from tomorrow.  Will need to closely monitor renal function.   Hepatic cirrhosis with esophageal varices Has been off alcohol for last 11 months.  Status post variceal banding on 09/28/21.  Continue octreotide drip, Protonix twice daily.  GI has been consulted will follow recommendation   Diabetes mellitus without complication ([place]) Latest hemoglobin A1C of 6.0 in 09/2021.  Hold glipizide from home including long-acting  insulin for now.  Continue sliding scale insulin and Accu-Cheks.  GERD (gastroesophageal reflux disease) Continue protonix IV BID

## 2021-10-31 NOTE — Progress Notes (Signed)
Blood administration started, 15 minutes post blood transfusion obtained and are WNL. Pt denies any pain, SOB, chills at this time. Consent for EGD also signed by pt.

## 2021-10-31 NOTE — Anesthesia Postprocedure Evaluation (Signed)
Anesthesia Post Note  Patient: Austin Morgan  Procedure(s) Performed: ESOPHAGOGASTRODUODENOSCOPY (EGD) WITH PROPOFOL  Patient location during evaluation: PACU Anesthesia Type: General Level of consciousness: awake and alert and oriented Pain management: pain level controlled Vital Signs Assessment: post-procedure vital signs reviewed and stable Respiratory status: spontaneous breathing, nonlabored ventilation and respiratory function stable Cardiovascular status: blood pressure returned to baseline and stable Postop Assessment: no apparent nausea or vomiting Anesthetic complications: no   No notable events documented.   Last Vitals:  Vitals:   10/31/21 1400 10/31/21 1537  BP: 126/73 105/70  Pulse: 65 78  Resp: 13 11  Temp: 36.8 C (!) 36.3 C  SpO2: 93% 95%    Last Pain:  Vitals:   10/31/21 1521  TempSrc:   PainSc: 0-No pain                 Nylia Gavina C Jakhia Buxton

## 2021-10-31 NOTE — TOC Progression Note (Signed)
  Transition of Care [facility]) Screening Note   Patient Details  Name: Izaah Westman Date of Birth: [birth year]   [facility] of [facility] CM/SW Contact:    Shade Flood, LCSW Phone Number: 10/31/2021, 9:34 AM    Transition of Care Department [facility]) has reviewed patient and no TOC needs have been identified at this time. We will continue to monitor patient advancement through interdisciplinary progression rounds. If new patient transition needs arise, please place a TOC consult.

## 2021-10-31 NOTE — Progress Notes (Addendum)
PROGRESS NOTE    Austin Morgan  YQM:[id number] DOB: [birth year] DOA: 10/30/2021 PCP: Kathyrn Drown, MD    Brief Narrative:  Austin Morgan is a 66 y.o. male with medical history significant for type 2 diabetes, depression, gout, hypertension, hyperlipidemia, obstructive sleep apnea, CKD stage IIIa, hepatic cirrhosis with varices, history of alcohol abuse abstinent for 11 months, depression and anxiety, restless leg syndrome presented to hospital for abnormal labs and volume overload with progressive lower extremity edema and dyspnea on exertion.  Of note patient was admitted at [facility] between 412 and 413 for acute anemia and acute on chronic kidney disease and had received 3 units of packed RBC at that time.  Had EGD done 09/28/2021, by Dr. Gala Romney with findings of Esophageal varices with bleeding stigmata status post esophageal band ligation. Normal duodenal bulb and second portion of the duodenum. Portal gastropathy/friable gastric mucosa no specimens collected. Gastric polyps not manipulated.  Patient had outpatient labs with creatinine elevation at 2.5 and HCTZ and lisinopril on hold.  Baseline creatinine is around 1.3-1.4.  In the ED patient had stable vitals.  Hemoglobin was 7.5 with creatinine of 2.02, BNP at 709.  Chest x-ray showed cardiomegaly with diffuse interstitial and patchy airspace opacities and bilateral small pleural effusion.  In the ED, patient was given 40 of Lasix, nephrology was consulted and patient was admitted to hospital for further evaluation and treatment.  Assessment and Plan:  Principal Problem:   Acute exacerbation of CHF (congestive heart failure) (HCC) Active Problems:   Symptomatic anemia   Acute kidney injury superimposed on chronic kidney disease (HCC)   Hepatic cirrhosis with esophageal varices   Diabetes mellitus without complication (HCC)   GERD (gastroesophageal reflux disease)   Obstructive sleep apnea   * Acute exacerbation of CHF (congestive heart  failure) ([place]) Patient had signs of volume overload on presentation with elevated JVD edema and crackles on exam with chest x-ray/stable condition.  Off Lasix and ACE inhibitor for 1 week due to AKI.  Recent 2D echocardiogram with normal EF.  Patient was given Lasix 40 in the ED and nephrology from the ED had recommended for Lasix twice daily.  We will continue to monitor closely.  Patient will receive 1 unit of packed RBC and will be given 40 mg of Lasix after PRBC.  We will add 40 mg twice daily of Lasix starting on 11/01/2021.  Feels better after Lasix.  Symptomatic anemia Had received 3 units of packed RBC for hemoglobin of 5.4 on last admission.  Hemoglobin of 7.0 today.  Will transfuse 1 unit of packed RBC.  Esophageal varices banded on EGD on 09/28/21.  No signs of bleeding at this time.  On octreotide drip and PPI.  GI has been consulted planning for endoscopic evaluation..  Patient denies any gross hematemesis or melena.   Acute kidney injury superimposed on chronic kidney disease stage IIIa Creatinine around 1.3-1.4.  Metformin HCTZ and lisinopril on hold.  Continue intake and output charting Daily weights.  Continue Lasix '40mg'$  twice daily as per nephrology recommendations but has received 1 dose of IV Lasix yesterday.  Creatinine improved to 1.9 today.  Will receive 1 dose of IV Lasix after blood transfusion today and will resume twice daily from tomorrow.  Will need to closely monitor renal function.   Hepatic cirrhosis with esophageal varices Has been off alcohol for last 11 months.  Status post variceal banding on 09/28/21.  Continue octreotide drip, Protonix twice daily.  GI has been consulted  will follow recommendation   Diabetes mellitus without complication ([place]) Latest hemoglobin A1C of 6.0 in 09/2021.  Hold glipizide from home including long-acting insulin for now.  Continue sliding scale insulin and Accu-Cheks.  GERD (gastroesophageal reflux disease) Continue protonix IV BID        DVT prophylaxis: Place TED hose Start: 10/30/21 2040   Code Status:     Code Status: Full Code  Disposition: Home Status is: Observation  The patient will require care spanning > 2 midnights and should be moved to inpatient because: GI bleed, need for blood transfusion, octreotide drip, possible endoscopic intervention, IV diuretics   Family Communication: Communicated with the patient's contact Ms [name] on the phone and updated her about the clinical condition of the patient.  Consultants:  GI  Procedures:  None yet  Antimicrobials:  Rocephin IV for SBP prophylaxis  Anti-infectives (From admission, onward)    Start     Dose/Rate Route Frequency Ordered Stop   10/31/21 1045  cefTRIAXone (ROCEPHIN) 1 g in sodium chloride 0.9 % 100 mL IVPB        1 g 200 mL/hr over 30 Minutes Intravenous Every 24 hours 10/31/21 0947     10/30/21 2045  cefTRIAXone (ROCEPHIN) 1 g in sodium chloride 0.9 % 100 mL IVPB        1 g 200 mL/hr over 30 Minutes Intravenous  Once 10/30/21 2037 10/30/21 2251      Subjective: Today, patient was seen and examined at bedside.  Denies any nausea vomiting or abdominal pain.  Has not had hematemesis or melena.  Shortness of breath has slightly improved compared to yesterday.  Complains of mild abdominal discomfort.  Objective: Vitals:   10/31/21 0107 10/31/21 0423 10/31/21 0430 10/31/21 0914  BP: 100/62 107/67  107/66  Pulse: 65 63  64  Resp: 14   20  Temp: 97.8 F (36.6 C) 97.9 F (36.6 C)  98.6 F (37 C)  TempSrc: Oral Oral  Oral  SpO2: 92% 92%  93%  Weight:   92.2 kg   Height:        Intake/Output Summary (Last 24 hours) at 10/31/2021 0948 Last data filed at 10/30/2021 2224 Gross per 24 hour  Intake 130 ml  Output 900 ml  Net -770 ml   Filed Weights   10/30/21 1423 10/31/21 0430  Weight: 90 kg 92.2 kg    Physical Examination:  General:  Average built, not in obvious distress HENT:   Mild pallor noted.  Oral mucosa is moist.  Chest:    Diminished breath sounds bilaterally. No crackles or wheezes.  CVS: S1 &S2 heard.  Murmur noted.  Regular rate and rhythm. Abdomen: Soft, nontender, nondistended.  Bowel sounds are heard.   Extremities: No cyanosis, clubbing but with bilateral gross pitting edema peripheral pulses are palpable. Psych: Alert, awake and oriented, normal mood CNS:  No cranial nerve deficits.  Power equal in all extremities.   Skin: Warm and dry.  No rashes noted.  Data Reviewed:   CBC: Recent Labs  Lab 10/30/21 1051 10/30/21 1917 10/31/21 0125 10/31/21 0719  WBC 4.3 4.5 4.2 3.7*  NEUTROABS 2.6  --   --   --   HGB 7.5* 7.7* 7.1* 7.0*  HCT 24.8* 24.2* 22.5* 22.6*  MCV 91.9 89.6 90.7 90.4  PLT 105* 112* 96* 98*    Basic Metabolic Panel: Recent Labs  Lab 10/30/21 1051 10/30/21 1917 10/31/21 0125  NA 135  --  135  K 4.6  --  4.1  CL 107  --  107  CO2 24  --  23  GLUCOSE 88  --  80  BUN 32*  --  29*  CREATININE 2.02*  --  1.99*  CALCIUM 9.1  --  8.7*  MG  --  1.8  --     Liver Function Tests: Recent Labs  Lab 10/30/21 1051 10/31/21 0125  AST 27 28  ALT 21 19  ALKPHOS 82 80  BILITOT 0.8 0.6  PROT 7.5 6.9  ALBUMIN 3.4* 3.2*     Radiology Studies: DG Chest 2 View  Result Date: 10/30/2021 CLINICAL DATA:  swelling EXAM: CHEST - 2 VIEW COMPARISON:  None Available. FINDINGS: Enlargement of the cardiac silhouette. Diffuse interstitial and patchy airspace opacities. Small bilateral pleural effusions. No visible pneumothorax. ACDF. IMPRESSION: Cardiomegaly, diffuse interstitial and patchy airspace opacities, and small bilateral pleural effusions. Findings are most suggestive of congestive heart failure with mild edema. Infection is not excluded. Electronically Signed   By: Margaretha Sheffield M.D.   On: 10/30/2021 16:21      LOS: 0 days    Flora Lipps, MD [facility] Available via Epic secure chat 7am-7pm After these hours, please refer to coverage provider listed on  [email or web address] 10/31/2021, 9:48 AM

## 2021-10-31 NOTE — Op Note (Signed)
[facility] Patient Name: Austin Morgan Procedure Date: 10/31/2021 3:14 PM MRN: [id number] Date of Birth: 1956-03-05 Attending MD: Maylon Peppers ,  CSN: [id number] Age: 66 Admit Type: Outpatient Procedure:                Upper GI endoscopy Indications:              Iron deficiency anemia, Coffee-ground emesis Providers:                Maylon Peppers, Rosina Lowenstein, RN, Raphael Gibney,                            Technician, Randa Spike, Technician Referring MD:              Medicines:                Monitored Anesthesia Care Complications:            No immediate complications. Estimated Blood Loss:     Estimated blood loss: none. Procedure:                Pre-Anesthesia Assessment:                           - Prior to the procedure, a History and Physical                            was performed, and patient medications, allergies                            and sensitivities were reviewed. The patient's                            tolerance of previous anesthesia was reviewed.                           - The risks and benefits of the procedure and the                            sedation options and risks were discussed with the                            patient. All questions were answered and informed                            consent was obtained.                           - ASA Grade Assessment: III - A patient with severe                            systemic disease.                           After obtaining informed consent, the endoscope was                            passed under direct vision. Throughout  the                            procedure, the patient's blood pressure, pulse, and                            oxygen saturations were monitored continuously. The                            [id number]-[id number] ([id number]) scope was introduced through the                            mouth, and advanced to the second part of duodenum.                            The upper GI  endoscopy was accomplished without                            difficulty. The patient tolerated the procedure                            well. Scope In: 3:22:08 PM Scope Out: 3:32:17 PM Total Procedure Duration: 0 hours 10 minutes 9 seconds  Findings:      Grade I varices were found in the lower third of the esophagus. There       was presence of a single subtle linear area which raised the concern for       possible stigmata of bleeding. However, given the size of the varices,       no bands could be deployed despite multiple attempts to perform this.      Portal hypertensive gastropathy was found in the entire examined stomach.      The examined duodenum was normal. Impression:               - Grade I esophageal varices.                           - Portal hypertensive gastropathy.                           - Normal examined duodenum.                           - No specimens collected. Moderate Sedation:      Per Anesthesia Care Recommendation:           - Return patient to hospital ward for ongoing care.                           - Clear liquid diet.                           - Repeat EGD in 6 months for surveillance of                            varices.                           -  Stop octreotide and ceftriaxon                           - Monitor H/H every day                           - C/w pantoprazole '40mg'$  BID Procedure Code(s):        --- Professional ---                           [phone], Esophagogastroduodenoscopy, flexible,                            transoral; diagnostic, including collection of                            specimen(s) by brushing or washing, when performed                            (separate procedure) Diagnosis Code(s):        --- Professional ---                           I85.00, Esophageal varices without bleeding                           K76.6, Portal hypertension                           K31.89, Other diseases of stomach and duodenum                            D50.9, Iron deficiency anemia, unspecified                           K92.0, Hematemesis CPT copyright 2019 [employer]. All rights reserved. The codes documented in this report are preliminary and upon coder review may  be revised to meet current compliance requirements. Maylon Peppers, MD Maylon Peppers,  10/31/2021 3:42:57 PM This report has been signed electronically. Number of Addenda: 0

## 2021-10-31 NOTE — Consult Note (Addendum)
_0 @   Referring Provider: [facility]  Primary Care Physician:  Kathyrn Drown, MD Primary Gastroenterologist:  Dr. Gala Romney  Date of Admission: 10/30/21 Date of Consultation: 10/31/21  Reason for Consultation:  Anemia, cirrhosis  HPI:  Austin Morgan is a 66 y.o. year old male with history of diabetes, HTN, HLD, alcohol abuse in remission (October 2022), CKD, anemia, GERD, adenomatous colon polyps, cirrhosis with thrombocytopenia and history of suspected variceal bleed in April 2023 receiving 3 units PRBCs s/p variceal banding 09/28/2021, currently scheduled for repeat EGD 6/1, who presented to the emergency room evening of 5/22 after having blood work completed with his PCP due to elevated BNP and reports of shortness of breath as well as abdominal distention.  Of note, patient had previously been on Lasix for a couple of weeks, but developed acute on chronic kidney injury on 5/16 with creatinine 2.53.  Lisinopril, metformin, HCTZ, and Lasix were held.  ED course: Hemodynamically stable. Hemoglobin 7.7 (stable over the last week), but down from 9.1, 4 weeks ago. CMP prior to admission with creatinine at 2.02, BUN 32, electrolytes, LFTs within normal limits. BNP prior to admission 709. TSH within normal limits. INR 1.3. Respiratory panel in process. Chest x-ray with cardiomegaly, diffuse interstitial and patchy airspace opacities, small bilateral pleural effusion most suggestive of congestive heart failure with mild edema, infection not excluded.  He was given 40 mg of Lasix.  Nephrology and GI were consulted.  Nephrology recommended Lasix 40 mg twice daily, but wife hesitant due to renal function.  Plan to see how he did overnight, follow-up on renal function and output, and adjust to twice daily if needed. He was also started on octreotide, IV PPI twice daily, and was given a dose of Rocephin for SBP prophylaxis.  Today, hemoglobin down to 7.1.   Today: Patient reports he was  started on Lasix daily a couple weeks ago, previously had been taking as needed.  However, his kidney function worsened last week and he was told to stop Lasix.  Since that time, he has had significant worsening of peripheral edema which she describes as a bloating "all over" with associated shortness of breath and decreased appetite.  Reports he had swelling in his face, abdomen, lower extremities, hands, scrotum.  He received IV Lasix last night and reports significant improvement.  Reports SOB and peripheral edema are much better. Abdomen feel like it is back to normal.   Denies BRBPR, melena, change in bowel habits, nausea, vomiting.  He has noticed waking up with a ring of dried blood around his mouth over the weekend.  He had similar symptoms back in April.   Denies fever, chills, cold or flulike symptoms, mental status changes.  No NSAIDs.  No alcohol.  EGD 09/28/2021: Grade 2-3 esophageal varices, grade 3 varix with small cherry red spot s/p esophageal band ligation, portal gastropathy/friable gastric mucosa, gastric polyps not manipulated, normal examined duodenum.  Past Medical History:  Diagnosis Date   Anxiety    Arthritis    Depression    Diabetes mellitus without complication ([place])    ED (erectile dysfunction)    Gout    H/O ETOH abuse    Hyperlipidemia    Hypertension    Hypertriglyceridemia    PONV (postoperative nausea and vomiting)    Sleep apnea     Past Surgical History:  Procedure Laterality Date   CARPAL TUNNEL RELEASE Bilateral    COLONOSCOPY  2010   Dr. Delfin Edis: mild diverticulosis, next colonoscopy  2020   COLONOSCOPY WITH PROPOFOL N/A 01/21/2020   Rourk: 9 polyps removed, multiple tubular adenomas.  Next colonoscopy in 3 years.   ELBOW SURGERY     ESOPHAGEAL BANDING  09/28/2021   Procedure: ESOPHAGEAL BANDING;  Surgeon: [name], MD;  [facility];  Service: Endoscopy;;   ESOPHAGOGASTRODUODENOSCOPY (EGD) WITH PROPOFOL N/A 01/21/2020    [facility]ourk: normal   ESOPHAGOGASTRODUODENOSCOPY (EGD) WITH PROPOFOL N/A 09/28/2021   Procedure: ESOPHAGOGASTRODUODENOSCOPY (EGD) WITH PROPOFOL;  Surgeon: [name], MD;  Location: [facility];  Service: Endoscopy;  Laterality: N/A;  3:00pm   NECK SURGERY     c4   POLYPECTOMY  01/21/2020   Procedure: POLYPECTOMY;  Surgeon: [name], MD;  Location: [facility];  Service: Endoscopy;;  colon    Prior to Admission medications   Medication Sig Start Date End Date Taking? Authorizing Provider  albuterol (VENTOLIN HFA) 108 (90 Base) MCG/ACT inhaler Inhale 2 puffs into the lungs every 6 (six) hours as needed for wheezing. 10/24/21   Kathyrn Drown, MD  Ascorbic Acid (VITAMIN C) 500 MG CAPS Take 1,000 mg by mouth daily. 09/21/21   Barton Dubois, MD  blood glucose meter kit and supplies KIT Dispense based on patient and insurance preference. Use up to BID as directed. 03/20/19   Nilda Simmer, NP  fish oil-omega-3 fatty acids 1000 MG capsule Take 1 g by mouth 2 (two) times daily.     [provider]  furosemide (LASIX) 20 MG tablet 1 tablet by mouth once in the morning as needed for edema 09/25/21   Kathyrn Drown, MD  glipiZIDE (GLUCOTROL) 5 MG tablet Take one tablet po BID Patient taking differently: Take 2.5 mg by mouth 2 (two) times daily before a meal. 05/10/21   Luking, Scott A, MD  insulin detemir (LEVEMIR FLEXTOUCH) 100 UNIT/ML FlexPen Inject 15 Units into the skin daily. 8 units qpm may titrate up to 40 units under direction of physician Patient taking differently: Inject 16 Units into the skin every evening. 09/21/21   Barton Dubois, MD  metFORMIN (GLUCOPHAGE) 500 MG tablet Take one tablet po bid Patient taking differently: Take 500 mg by mouth 2 (two) times daily with a meal. 08/15/21   Luking, Elayne Snare, MD  Multiple Vitamins-Minerals (MULTIVITAMIN WITH MINERALS) tablet Take 1 tablet by mouth daily.    [provider]  nadolol (CORGARD) 40 MG tablet Take 1/2 (one  half) tablet po daily 10/24/21   Luking, Elayne Snare, MD  naltrexone (DEPADE) 50 MG tablet TAKE ONE TABLET BY MOUTH ONCE DAILY. 01/02/21   Kathyrn Drown, MD  ONETOUCH ULTRA test strip USE AS DIRECTED TO TEST BLOOD GLUCOSE TWICE DAILY. 10/02/19   Kathyrn Drown, MD  pantoprazole (PROTONIX) 40 MG tablet Take 1 tablet (40 mg total) by mouth 2 (two) times daily. 09/25/21   Kathyrn Drown, MD  pramipexole (MIRAPEX) 0.25 MG tablet TAKE 1 TABLET BY MOUTH NIGHTLY AT  LEAST  2  HOURS  BEFORE  BEDTIME  FOR  RESTLESS  LEGS Patient taking differently: Take 0.5 mg by mouth at bedtime. TAKE 1 TABLET BY MOUTH NIGHTLY AT  LEAST  2  HOURS  BEFORE  BEDTIME  FOR  RESTLESS  LEGS 09/14/21   Kathyrn Drown, MD  sodium chloride (OCEAN) 0.65 % SOLN nasal spray Place 1 spray into both nostrils as needed for congestion.    [provider]  traZODone (DESYREL) 100 MG tablet Take 1 tablet (  100 mg total) by mouth at bedtime as needed. for sleep 08/15/21   Kathyrn Drown, MD    Current Facility-Administered Medications  Medication Dose Route Frequency Provider Last Rate Last Admin   0.9 %  sodium chloride infusion  250 mL Intravenous PRN Orma Flaming, MD       acetaminophen (TYLENOL) tablet 650 mg  650 mg Oral Q6H PRN Orma Flaming, MD       Or   acetaminophen (TYLENOL) suppository 650 mg  650 mg Rectal Q6H PRN Orma Flaming, MD       albuterol (PROVENTIL) (2.5 MG/3ML) 0.083% nebulizer solution 3 mL  3 mL Inhalation Q6H PRN Orma Flaming, MD       insulin aspart (novoLOG) injection 0-9 Units  0-9 Units Subcutaneous TID WC Orma Flaming, MD       nadolol (CORGARD) tablet 20 mg  20 mg Oral Daily Orma Flaming, MD       octreotide (SANDOSTATIN) 500 mcg in sodium chloride 0.9 % 250 mL (2 mcg/mL) infusion  50 mcg/hr Intravenous Continuous Orma Flaming, MD 25 mL/hr at 10/30/21 2307 50 mcg/hr at 10/30/21 2307   pantoprazole (PROTONIX) injection 40 mg  40 mg Intravenous Q12H Orma Flaming, MD   40 mg at 10/30/21 2224    pramipexole (MIRAPEX) tablet 0.5 mg  0.5 mg Oral QHS Orma Flaming, MD   0.5 mg at 10/30/21 2219   sodium chloride flush (NS) 0.9 % injection 3 mL  3 mL Intravenous Q12H Orma Flaming, MD   3 mL at 10/30/21 2224   sodium chloride flush (NS) 0.9 % injection 3 mL  3 mL Intravenous PRN Orma Flaming, MD       traZODone (DESYREL) tablet 100 mg  100 mg Oral QHS Orma Flaming, MD   100 mg at 10/30/21 2219    Allergies as of 10/30/2021 - Review Complete 10/30/2021  Allergen Reaction Noted   Codeine Nausea And Vomiting 11/26/2008    Family History  Problem Relation Age of Onset   Heart attack Father    Colon cancer Neg Hx     Social History   Socioeconomic History   Marital status: Married    Spouse name: Not on file   Number of children: Not on file   Years of education: Not on file   Highest education level: Not on file  Occupational History   Not on file  Tobacco Use   Smoking status: Former    Packs/day: 1.00    Years: 36.00    Pack years: 36.00    Types: Cigarettes    Quit date: 03/11/2016    Years since quitting: 5.6   Smokeless tobacco: Never  Vaping Use   Vaping Use: Never used  Substance and Sexual Activity   Alcohol use: Not Currently    Comment: drinks a fifth of liquor per week; inpatient rehab 08/2019. denied 10/14/19. No etoh since 03/2021 (as of 09/22/21)   Drug use: No   Sexual activity: Yes  Other Topics Concern   Not on file  Social History Narrative   Wife, Takuya Lariccia NP.   Social Determinants of Health   Financial Resource Strain: Not on file  Food Insecurity: Not on file  Transportation Needs: Not on file  Physical Activity: Not on file  Stress: Not on file  Social Connections: Not on file  Intimate Partner Violence: Not on file    Review of Systems: Gen: Denies fever, chills, cold or flulike symptoms, presyncope, syncope. CV: Denies chest pain,  heart palpitations. Resp: SOB improved. No cough.  GI: See HPI GU : Denies urinary burning,  urinary frequency, urinary incontinence, hematuria.  MS: Denies joint pain. Psych: Denies confusion or mental status changes.  Heme: See HPI  Physical Exam: Vital signs in last 24 hours: Temp:  [97.8 F (36.6 C)-98.7 F (37.1 C)] 97.9 F (36.6 C) (05/23 0423) Pulse Rate:  [56-66] 63 (05/23 0423) Resp:  [9-23] 14 (05/23 0107) BP: (100-150)/(62-90) 107/67 (05/23 0423) SpO2:  [92 %-100 %] 92 % (05/23 0423) Weight:  [90 kg-92.2 kg] 92.2 kg (05/23 0430)   General:   Alert,  Well-developed, well-nourished, pleasant and cooperative in NAD Head:  Normocephalic and atraumatic. Eyes:  Sclera clear, no icterus.   Conjunctiva pink. Ears:  Normal auditory acuity. Nose:  No deformity, discharge,  or lesions. Lungs:  Crackles appreciated in lung bases. No wheezes or rhonchi. No acute distress. Heart:  Regular rate and rhythm; no murmurs, clicks, rubs,  or gallops. Abdomen:  + BS, full, but soft and nontender. No masses. Liver border palpated just below right costal margin.  Rectal:  Deferred  Msk:  Symmetrical without gross deformities. Normal posture. Extremities:  Without edema. Neurologic:  Alert and  oriented x4;  grossly normal neurologically. Psych:  Normal mood and affect.  Intake/Output from previous day: 05/22 0701 - 05/23 0700 In: 130 [P.O.:120; I.V.:10] Out: 900 [Urine:900] Intake/Output this shift: No intake/output data recorded.  Lab Results: Recent Labs    10/30/21 1051 10/30/21 1917 10/31/21 0125  WBC 4.3 4.5 4.2  HGB 7.5* 7.7* 7.1*  HCT 24.8* 24.2* 22.5*  PLT 105* 112* 96*   BMET Recent Labs    10/30/21 1051 10/31/21 0125  NA 135 135  K 4.6 4.1  CL 107 107  CO2 24 23  GLUCOSE 88 80  BUN 32* 29*  CREATININE 2.02* 1.99*  CALCIUM 9.1 8.7*   LFT Recent Labs    10/30/21 1051 10/31/21 0125  PROT 7.5 6.9  ALBUMIN 3.4* 3.2*  AST 27 28  ALT 21 19  ALKPHOS 82 80  BILITOT 0.8 0.6   PT/INR Recent Labs    10/30/21 2017  LABPROT 15.9*  INR 1.3*    Studies/Results: DG Chest 2 View  Result Date: 10/30/2021 CLINICAL DATA:  swelling EXAM: CHEST - 2 VIEW COMPARISON:  None Available. FINDINGS: Enlargement of the cardiac silhouette. Diffuse interstitial and patchy airspace opacities. Small bilateral pleural effusions. No visible pneumothorax. ACDF. IMPRESSION: Cardiomegaly, diffuse interstitial and patchy airspace opacities, and small bilateral pleural effusions. Findings are most suggestive of congestive heart failure with mild edema. Infection is not excluded. Electronically Signed   By: Margaretha Sheffield M.D.   On: 10/30/2021 16:21    Impression:  66 y.o. year old male with history of diabetes, HTN, HLD, alcohol abuse in remission (October 2022), CKD, anemia, GERD, adenomatous colon polyps, cirrhosis with thrombocytopenia and history of suspected variceal bleed in April 2023 receiving 3 units PRBCs s/p variceal banding 09/28/2021, currently scheduled for repeat EGD 6/1, who presented to the emergency room evening of 5/22 after having blood work completed with his PCP due to elevated BNP and reports of shortness of breath as well as abdominal distention. He was found to have anemia with hemoglobin stable over the last week, elevated creatinine, CXR suggestive of congestive heart failure vs infection and was give IV lasix.   Acute on chronic anemia:  Previously admitted on 4/12 for symptomatic anemia with hemoglobin down to 5.4 s/p 3 units PRBCs.  Underwent  EGD revealing esophageal varices with stigmata of recent bleed s/p banding on 4/20, also with portal gastropathy/friable gastric mucosa.   Hemoglobin had improved up to 9.1 on 4/25.  Hemoglobin 7.7 on admission (stable over the last week), down to 7.0 today.  Denies BRBPR, melena, hematemesis, though he does report waking up with a ring of dried blood around his mouth over the weekend.  Reports similar symptoms when he was admitted in April. Denies NSIADs. Takes PPI BID outpatient.   Differential  include recurrent esophageal variceal bleeding vs slow blood loss from portal gastropathy/friable gastric mucosa vs small bowel AMVs in the setting of mild AS.  He needs repeat EGD.  He is currently on octreotide drip and PPI infusion which is appropriate until variceal bleeding has been ruled out. Received a dose of Rocephin for SBP prophylaxis last night. 1 unit PRBCs has been ordered today.   Anasarca/pulmonary interstitial edema:  1 week of worsening generalized body edema, DOE, found to have elevated BNP, and CXR with findings of volume overload suggestive of CHF exacerbation. Had been on Lasix 40 mg daily as an outpatient for 2 weeks, but stopped 1 week ago due to acute on chronic kidney disease. Prior echocardiogram in April with mild AS, mild LVH, and EF 60-65%. Suspect decompensated cirrhosis is likely playing a role here. Received IV Lasix 40 mg x 1 last night and reports significant improvement in generalized body edema and SOB. Still with 2+ pitting edema below the knees and crackles in the lung bases. Nepohrology was consulted and per H&P recommended IV lasix BID, but patient's wife (who is a Designer, jewellery) was hesitant to start this, so planned to monitor response to once daily dosing. Cr is stable this morning at 1.99 (2.02 on admission). He is going to need daily diuretics which is going to be a delicate balance with CKD. Will defer to nephrology this admission.   Cirrhosis: Likely alcohol related, now abstinent since October 2022.  Complicated by esophageal varices with prior variceal bleed s/p banding April 2023, coagulopathy with thrombocytopenia and mildly elevated INR, and now anasarca, likely multifactorial as discussed above. No HE. MELD NA 18 this admission, up from MELD Na 14 in April.  We will need to continue to monitor closely.  May need outpatient referral for consideration of transplant if MELD does not improve.    Plan: Agree with transfusing 1 unit PRBCs today.  EGD  with possible variceal banding today with propofol with Dr. [name]. The risks, benefits, and alternatives have been discussed with the patient in detail. The patient states understanding and desires to proceed.  Will determine ongoing need for SBP prophylaxis following EGD.  Continue octreotide infusion. Continue IV PPI BID.  Diuretic management per nephrology. Daily CBC, CMP, INR.  Monitor for overt GI bleeding.  Monitor for HE. Ongoing cirrhosis care outpatient. May need to consider transplant evaluation in the near future.     LOS: 0 days    10/31/2021, 7:30 AM   Aliene Altes, [facility]

## 2021-10-31 NOTE — Progress Notes (Signed)
Patient taken to room via wheel chair. Bilateral hearing aids in place and glassed put on.

## 2021-10-31 NOTE — Anesthesia Preprocedure Evaluation (Signed)
Anesthesia Evaluation  Patient identified by MRN, date of birth, ID band Patient awake    Reviewed: Allergy & Precautions, NPO status , Patient's Chart, lab work & pertinent test results, reviewed documented beta blocker date and time   History of Anesthesia Complications (+) PONV and history of anesthetic complications  Airway Mallampati: II  TM Distance: >3 FB Neck ROM: Full    Dental  (+) Missing, Dental Advisory Given   Pulmonary sleep apnea , former smoker,    Pulmonary exam normal breath sounds clear to auscultation       Cardiovascular hypertension, Pt. on medications and Pt. on home beta blockers + Peripheral Vascular Disease (AAA) and +CHF  Normal cardiovascular exam Rhythm:Regular Rate:Normal  1. Left ventricular ejection fraction, by estimation, is 60 to 65%. The left ventricle has normal function. The left ventricle has no regional wall motion abnormalities. There is mild left ventricular hypertrophy.  Left ventricular diastolic parameters were normal. Elevated left ventricular end-diastolic pressure.  2. Right ventricular systolic function is normal. The right ventricular size is normal. There is normal pulmonary artery systolic pressure. The estimated right ventricular systolic pressure is 96.0 mmHg.  3. Left atrial size was mild to moderately dilated.  4. Right atrial size was moderately dilated.  5. The mitral valve is degenerative, mildly thickened with annular calcification. Mild mitral valve regurgitation.  6. The aortic valve is tricuspid. There is mild calcification of the aortic valve. left coronary cusp is significantly restricted. Aortic valve  regurgitation is trivial. Mild aortic valve stenosis. Aortic valve mean gradient measures 11.0 mmHg.  Dimenionless index 0.60.  7. Aortic dilatation noted. There is mild dilatation of the aortic root, measuring 41 mm.  8. The inferior vena cava is dilated in size  with >50% respiratory variability, suggesting right atrial pressure of 8 mmHg.    Neuro/Psych PSYCHIATRIC DISORDERS Anxiety Depression  Neuromuscular disease (RLS)    GI/Hepatic GERD  Medicated,(+) Cirrhosis   Esophageal Varices  substance abuse  alcohol use,   Endo/Other  diabetes, Well Controlled, Type 2, Insulin Dependent, Oral Hypoglycemic Agents  Renal/GU Renal InsufficiencyRenal disease  negative genitourinary   Musculoskeletal  (+) Arthritis , Osteoarthritis,    Abdominal   Peds negative pediatric ROS (+)  Hematology  (+) Blood dyscrasia, anemia ,   Anesthesia Other Findings  IMPRESSION: Cardiomegaly, diffuse interstitial and patchy airspace opacities, and small bilateral pleural effusions. Findings are most suggestive of congestive heart failure with mild edema. Infection is not excluded.   Electronically Signed   By: Margaretha Sheffield M.D.   On: 10/30/2021 16:21   Reproductive/Obstetrics negative OB ROS                            Anesthesia Physical Anesthesia Plan  ASA: 3  Anesthesia Plan: General   Post-op Pain Management: Minimal or no pain anticipated   Induction: Intravenous  PONV Risk Score and Plan: Propofol infusion  Airway Management Planned: Nasal Cannula and Natural Airway  Additional Equipment:   Intra-op Plan:   Post-operative Plan:   Informed Consent: I have reviewed the patients History and Physical, chart, labs and discussed the procedure including the risks, benefits and alternatives for the proposed anesthesia with the patient or authorized representative who has indicated his/her understanding and acceptance.     Dental advisory given  Plan Discussed with: CRNA and Surgeon  Anesthesia Plan Comments:         Anesthesia Quick Evaluation

## 2021-10-31 NOTE — Transfer of Care (Signed)
Immediate Anesthesia Transfer of Care Note  Patient: Austin Morgan  Procedure(s) Performed: ESOPHAGOGASTRODUODENOSCOPY (EGD) WITH PROPOFOL  Patient Location: PACU  Anesthesia Type:General  Level of Consciousness: sedated and drowsy  Airway & Oxygen Therapy: Patient Spontanous Breathing and Patient connected to nasal cannula oxygen  Post-op Assessment: Report given to RN and Post -op Vital signs reviewed and stable  Post vital signs: Reviewed and stable  Last Vitals:  Vitals Value Taken Time  BP 105/70 10/31/21 1538  Temp 97.5 10/31/21 1539  Pulse 65 10/31/21 1540  Resp 11 10/31/21 1540  SpO2 96 % 10/31/21 1540  Vitals shown include unvalidated device data.  Last Pain:  Vitals:   10/31/21 1521  TempSrc:   PainSc: 0-No pain         Complications: No notable events documented.

## 2021-11-01 ENCOUNTER — Encounter (HOSPITAL_COMMUNITY): Payer: Self-pay | Admitting: Gastroenterology

## 2021-11-01 ENCOUNTER — Ambulatory Visit: Payer: Medicare HMO | Admitting: Family Medicine

## 2021-11-01 DIAGNOSIS — F419 Anxiety disorder, unspecified: Secondary | ICD-10-CM | POA: Diagnosis not present

## 2021-11-01 DIAGNOSIS — N1832 Chronic kidney disease, stage 3b: Secondary | ICD-10-CM | POA: Diagnosis present

## 2021-11-01 DIAGNOSIS — E8770 Fluid overload, unspecified: Secondary | ICD-10-CM | POA: Diagnosis not present

## 2021-11-01 DIAGNOSIS — R601 Generalized edema: Secondary | ICD-10-CM

## 2021-11-01 DIAGNOSIS — N179 Acute kidney failure, unspecified: Secondary | ICD-10-CM | POA: Diagnosis not present

## 2021-11-01 DIAGNOSIS — D649 Anemia, unspecified: Secondary | ICD-10-CM | POA: Diagnosis not present

## 2021-11-01 DIAGNOSIS — K219 Gastro-esophageal reflux disease without esophagitis: Secondary | ICD-10-CM | POA: Diagnosis not present

## 2021-11-01 LAB — COMPREHENSIVE METABOLIC PANEL
ALT: 19 U/L (ref 0–44)
AST: 28 U/L (ref 15–41)
Albumin: 3.2 g/dL — ABNORMAL LOW (ref 3.5–5.0)
Alkaline Phosphatase: 76 U/L (ref 38–126)
Anion gap: 7 (ref 5–15)
BUN: 32 mg/dL — ABNORMAL HIGH (ref 8–23)
CO2: 23 mmol/L (ref 22–32)
Calcium: 8.7 mg/dL — ABNORMAL LOW (ref 8.9–10.3)
Chloride: 103 mmol/L (ref 98–111)
Creatinine, Ser: 2.29 mg/dL — ABNORMAL HIGH (ref 0.61–1.24)
GFR, Estimated: 31 mL/min — ABNORMAL LOW (ref >60)
Glucose, Bld: 108 mg/dL — ABNORMAL HIGH (ref 70–99)
Potassium: 4.9 mmol/L (ref 3.5–5.1)
Sodium: 133 mmol/L — ABNORMAL LOW (ref 135–145)
Total Bilirubin: 1.2 mg/dL (ref 0.3–1.2)
Total Protein: 7 g/dL (ref 6.5–8.1)

## 2021-11-01 LAB — BPAM RBC
Blood Product Expiration Date: 202306222359
ISSUE DATE / TIME: 202305231025
Unit Type and Rh: 5100

## 2021-11-01 LAB — MAGNESIUM: Magnesium: 1.6 mg/dL — ABNORMAL LOW (ref 1.7–2.4)

## 2021-11-01 LAB — HEMOGLOBIN AND HEMATOCRIT, BLOOD
HCT: 24.6 % — ABNORMAL LOW (ref 39.0–52.0)
HCT: 26.7 % — ABNORMAL LOW (ref 39.0–52.0)
Hemoglobin: 8 g/dL — ABNORMAL LOW (ref 13.0–17.0)
Hemoglobin: 8.6 g/dL — ABNORMAL LOW (ref 13.0–17.0)

## 2021-11-01 LAB — PROTIME-INR
INR: 1.4 — ABNORMAL HIGH (ref 0.8–1.2)
Prothrombin Time: 17.1 seconds — ABNORMAL HIGH (ref 11.4–15.2)

## 2021-11-01 LAB — TYPE AND SCREEN
ABO/RH(D): O POS
Antibody Screen: NEGATIVE
Unit division: 0

## 2021-11-01 LAB — GLUCOSE, CAPILLARY
Glucose-Capillary: 110 mg/dL — ABNORMAL HIGH (ref 70–99)
Glucose-Capillary: 143 mg/dL — ABNORMAL HIGH (ref 70–99)
Glucose-Capillary: 149 mg/dL — ABNORMAL HIGH (ref 70–99)
Glucose-Capillary: 169 mg/dL — ABNORMAL HIGH (ref 70–99)

## 2021-11-01 MED ORDER — ONDANSETRON HCL 4 MG/2ML IJ SOLN
4.0000 mg | Freq: Four times a day (QID) | INTRAMUSCULAR | Status: DC | PRN
  Administered 2021-11-01: 4 mg via INTRAVENOUS
  Filled 2021-11-01: qty 2

## 2021-11-01 MED ORDER — MAGNESIUM SULFATE 4 GM/100ML IV SOLN
4.0000 g | Freq: Once | INTRAVENOUS | Status: AC
  Administered 2021-11-01: 4 g via INTRAVENOUS
  Filled 2021-11-01: qty 100

## 2021-11-01 MED ORDER — FUROSEMIDE 10 MG/ML IJ SOLN
40.0000 mg | Freq: Every day | INTRAMUSCULAR | Status: DC
  Administered 2021-11-02: 40 mg via INTRAVENOUS
  Filled 2021-11-01: qty 4

## 2021-11-01 MED ORDER — SODIUM CHLORIDE 0.9 % IV SOLN
300.0000 mg | Freq: Once | INTRAVENOUS | Status: AC
  Administered 2021-11-01: 300 mg via INTRAVENOUS
  Filled 2021-11-01: qty 15

## 2021-11-01 NOTE — Care Management Important Message (Signed)
Important Message  Patient Details  Name: Austin Morgan MRN: [id number] Date of Birth: [birth year]   Medicare Important Message Given:  N/A - LOS <3 / Initial given by admissions     Tommy Medal 11/01/2021, 11:44 AM

## 2021-11-01 NOTE — Progress Notes (Signed)
Subjective: Patient states that swelling appears to be somewhat better. He denies any pain,no nausea, vomiting, rectal bleeding or melena. No episodes of confusion. States last BM was possibly Monday morning. Abdomen feels about the same as it has been in regards to swelling, states he feels breathing has improved, he can take a deep breath now without issue.   Objective: Vital signs in last 24 hours: Temp:  [97.4 F (36.3 C)-98.9 F (37.2 C)] 98.1 F (36.7 C) (05/24 0423) Pulse Rate:  [49-78] 59 (05/24 0423) Resp:  [10-20] 15 (05/24 0423) BP: (95-136)/(58-84) 101/68 (05/24 0423) SpO2:  [92 %-97 %] 92 % (05/24 0423) Weight:  [92.9 kg] 92.9 kg (05/24 0500) Last BM Date : 10/30/21 General:   Alert and oriented, pleasant Head:  Normocephalic and atraumatic. Eyes:  No icterus, sclera clear. Conjuctiva pink.  Mouth:  Without lesions, mucosa pink and moist.  Heart:  S1, S2 present, no murmurs noted.  Lungs: lung sounds diminished on R, crackles noted in mid to lower Left lobes Abdomen:  Bowel sounds present, soft, non-tender. Abdomen is full but soft  Msk:  Symmetrical without gross deformities. Normal posture. Pulses:  Normal pulses noted. Extremities:  3+ pitting edema to bilateral LEs, below the knee Neurologic:  Alert and  oriented x4;  grossly normal neurologically. No asterixis Skin:  Warm and dry, intact without significant lesions.  Psych:  Alert and cooperative. Normal mood and affect.  Intake/Output from previous day: 05/23 0701 - 05/24 0700 In: 1134.9 [P.O.:120; I.V.:622.4; Blood:392.5] Out: 1100 [Urine:1100] Intake/Output this shift: No intake/output data recorded.  Lab Results: Recent Labs    10/30/21 1917 10/31/21 0125 10/31/21 0719 10/31/21 1648 11/01/21 0446  WBC 4.5 4.2 3.7*  --   --   HGB 7.7* 7.1* 7.0* 8.1* 8.0*  HCT 24.2* 22.5* 22.6* 25.4* 24.6*  PLT 112* 96* 98*  --   --    BMET Recent Labs    10/30/21 1051 10/31/21 0125 11/01/21 0446  NA 135  135 133*  K 4.6 4.1 4.9  CL 107 107 103  CO2 '24 23 23  '$ GLUCOSE 88 80 108*  BUN 32* 29* 32*  CREATININE 2.02* 1.99* 2.29*  CALCIUM 9.1 8.7* 8.7*   LFT Recent Labs    10/30/21 1051 10/31/21 0125 11/01/21 0446  PROT 7.5 6.9 7.0  ALBUMIN 3.4* 3.2* 3.2*  AST '27 28 28  '$ ALT '21 19 19  '$ ALKPHOS 82 80 76  BILITOT 0.8 0.6 1.2   PT/INR Recent Labs    10/30/21 2017  LABPROT 15.9*  INR 1.3*    Assessment:  66 y.o. year old male with history of diabetes, HTN, HLD, alcohol abuse in remission (October 2022), CKD, anemia, GERD, adenomatous colon polyps, cirrhosis with thrombocytopenia and history of suspected variceal bleed in April 2023 receiving 3 units PRBCs s/p variceal banding 09/28/2021, currently scheduled for repeat EGD 6/1, who presented to the emergency room evening of 5/22 after having blood work completed with his PCP due to elevated BNP and reports of shortness of breath as well as abdominal distention. He was found to have anemia with hemoglobin stable over the last week, elevated creatinine, CXR suggestive of congestive heart failure vs infection and was give IV lasix.  Acute on chronic anemia: Hgb 7.1 on admission yesterday, down from 9.1 one month ago, EGD yesterday with grade 1 esophageal varices with single subtle linear area questionable for possible stigmata of bleeding, no bands able to be deployed due to size of varices, also with  presence of portal hypertensive gastropathy. Octreotide was stopped after EGD yesterday. Last colonoscopy 2021 with presence of 9 polyps. Hgb 8 this morning. S/p 1 unit PRBCs yesterday. No episodes of rectal bleeding or hematemesis, only with previously noted coffee ground substance on patient's mouth upon waking, a few days ago.Will continue to monitor for overt rectal bleeding or further drop in hemoglobin, though no planned endoscopic procedures at this time.  Anasarca/pulmonary interstitial edema:  1 week of worsening generalized body edema, CXR  with findings of volume overload suggestive of CHF exacerbation. Had been on Lasix 40 mg daily as an outpatient for 2 weeks, but stopped 1 week ago due to acute on chronic kidney injury. Prior echocardiogram in April with EF 60-65%. Suspect decompensated cirrhosis likely contributing. Improvement with IV lasix on Monday night. Nephrology was consulted and per H&P recommended IV lasix BID. Cr up to 2.29 this morning (2.02 on admission), with nephrology recommended decrease in diuretics back to once a day, per H&P. Mild ascites noted today, LEs still with 3+ pitting edema, though no obvious edema to upper extremities. Patient feels that he is breathing much easier than a few days ago, still with crackles noted in left mid to lower lung. Should continue close monitoring for possible development of hepatorenal syndrome and delicate balance between fluid retention and diuretic requirements in presence of continued uptrending creatnine.   Cirrhosis: likely alcohol related, no ETOH since Oct 4431, complicated by esophageal vacies with prior variceal bleed s/p banding in April 2023, coagulopathy with thrombocytopenia and mildly elevated INR, with recent anasarca, likely multifactorial. No s/s of HE, MELD 21 this morning, though this is with INR from 5/22 and likely heavily influenced by increasing creatnine. Considerations for outpatient referral for transplant if MELD does not improve.   Plan: Continue to trend H&H, transfuse for hgb <7 MELD labs and INR daily, will order updated INR for today Monitor for overt GI bleeding Monitor for signs of HE Defer to nephrology for diuretic management given AKI Continue PPI BID Continue nadolol '20mg'$  daily  2g sodium diet   LOS: 1 day    11/01/2021, 9:19 AM  Haddon Fyfe L. Alver Sorrow, MSN, APRN, AGNP-C Adult-Gerontology Nurse Practitioner [facility]

## 2021-11-01 NOTE — Assessment & Plan Note (Signed)
--   stable, following, resumed home meds

## 2021-11-01 NOTE — Assessment & Plan Note (Addendum)
--   will offer nightly CPaP in hospital

## 2021-11-01 NOTE — Assessment & Plan Note (Signed)
--   He is diuresing on IV lasix

## 2021-11-01 NOTE — Progress Notes (Addendum)
PROGRESS NOTE   Austin Morgan  YQI:[id number] DOB: 31-Oct-1955 DOA: 10/30/2021 PCP: Kathyrn Drown, MD   Chief Complaint  Patient presents with   abnormal labs   Level of care: Telemetry  Brief Admission History:  Austin Morgan is a 66 y.o. male with medical history significant for type 2 diabetes, depression, gout, hypertension, hyperlipidemia, obstructive sleep apnea, CKD stage IIIa, hepatic cirrhosis with varices, history of alcohol abuse abstinent for 11 months, depression and anxiety, restless leg syndrome presented to hospital for abnormal labs and volume overload with progressive lower extremity edema and dyspnea on exertion.  Of note patient was admitted at [facility] between 412 and 413 for acute anemia and acute on chronic kidney disease and had received 3 units of packed RBC at that time.  Had EGD done 09/28/2021, by Dr. Gala Romney with findings of Esophageal varices with bleeding stigmata status post esophageal band ligation. Normal duodenal bulb and second portion of the duodenum. Portal gastropathy/friable gastric mucosa no specimens collected. Gastric polyps not manipulated.  Patient had outpatient labs with creatinine elevation at 2.5 and HCTZ and lisinopril on hold.  Baseline creatinine is around 1.3-1.4.  In the ED patient had stable vitals.  Hemoglobin was 7.5 with creatinine of 2.02, BNP at 709.  Chest x-ray showed cardiomegaly with diffuse interstitial and patchy airspace opacities and bilateral small pleural effusion.  In the ED, patient was given 40 of Lasix, nephrology was consulted and patient was admitted to hospital for further evaluation and treatment.        Assessment and Plan: * Volume overload 66 year old male with history of acute on CKD, anemia likely secondary to esophageal varices who presented with 1 week history of worsening LE edema, DOE, elevated BNP and CXR findings of volume overload as well as JVD, edema and crackles on exam consistent with CHF  exacerbation -strict I/O -daily weights -has been off lasix/ACEI x 1 week due to acute on chronic renal failure and symptoms seemed to correlate with progression since stopping -just had echo done a few weeks ago with normal EF and normal DD. Mild AS -given IV lasix.  Nephrology recommended '40mg'$  BID.  With bump in creatinine we have reduced to once daily dosing for now. He will need outpatient nephrology follow up.   Stage 3b chronic kidney disease (CKD) ([place]) -- monitoring renal function with diuresis  -- renally dose medications as appropriate  Anasarca -- He is diuresing on IV lasix   Acute kidney injury superimposed on chronic kidney disease ([place]) -stage 3a at baseline (1.3-1.4)  -had acute on chronic renal failure 2 weeks ago with peak creatinine of 2.53 after 2 weeks of lasix +nephrotoxic agents. Metformin, hctz, lisinopril all stopped. He has been off lasix x 1 week -worsening anemia likely also contributing  -holding nephrotoxic agents -strict I/O -UA  -trend -ED discussed with nephrology who recommended '40mg'$  IV lasix BID. He received '40mg'$  BID, creatinine slightly bumped, reduce to once daily.  Recheck RFP in AM.   Symptomatic anemia Admitted on 09/20/21 for symptomatic anemia with hgb down to 5.4 s/p 3 units PRBC hgb today is 7.5, transfuse if <7, serial cbc. Stat type and cross. Would give dose of lasix s/p transfusion if needed. hgb was 9.1 three weeks ago Esophageal varices banded on EGD on 09/28/21 He has no signs of active bleeding at this time GI consulted octreotide gtt discontinued by GI service  PPI IV q 12 hours   Hepatic cirrhosis with esophageal varices Abstinent from  alcohol for 11 months  S/p varice banding on 09/28/21 Octreotide gtt completed  protonix bid Ceftriaxone discontinued by GI service  GI consulted and will follow   GERD (gastroesophageal reflux disease) protonix IV BID   Obstructive sleep apnea -- will offer nightly CPaP in  hospital  Hypertension -- stable, following   Diabetes mellitus without complication ([place]) S0Y of 6.0 in 09/2021 Hold oral agent (glipizide) Holding long acting insulin with worsening renal function and liquid diet. BS running on lower end SSI and accuchecks per protocol for now   DVT prophylaxis: TED hoses Code Status: Full  Family Communication:  Disposition: Status is: Inpatient Remains inpatient appropriate because: IV furosemide    Consultants:  GI service  Procedures:   Antimicrobials:    Subjective: Pt asking about going home today.  No specific complaints other than not sleeping well.   Objective: Vitals:   11/01/21 0029 11/01/21 0423 11/01/21 0500 11/01/21 1321  BP: (!) 95/58 101/68  121/76  Pulse: (!) 58 (!) 59  (!) 57  Resp: '17 15  16  '$ Temp: 98.5 F (36.9 C) 98.1 F (36.7 C)    TempSrc: Oral Oral    SpO2: 93% 92%  91%  Weight:   92.9 kg   Height:        Intake/Output Summary (Last 24 hours) at 11/01/2021 1638 Last data filed at 11/01/2021 1300 Gross per 24 hour  Intake 582.39 ml  Output 600 ml  Net -17.61 ml   Filed Weights   10/30/21 1423 10/31/21 0430 11/01/21 0500  Weight: 90 kg 92.2 kg 92.9 kg   Examination:  General exam: Appears calm and comfortable  Respiratory system: Clear to auscultation. Respiratory effort normal. Cardiovascular system: normal S1 & S2 heard. No JVD, murmurs, rubs, gallops or clicks. 1+ pedal edema. Gastrointestinal system: Abdomen is nondistended, soft and nontender. No organomegaly or masses felt. Normal bowel sounds heard. Central nervous system: Alert and oriented. No focal neurological deficits. Extremities: Symmetric 5 x 5 power. 1+ edema BLEs.  Skin: No rashes, lesions or ulcers. Psychiatry: Judgement and insight appear normal. Mood & affect appropriate.   Data Reviewed: I have personally reviewed following labs and imaging studies  CBC: Recent Labs  Lab 10/30/21 1051 10/30/21 1917 10/31/21 0125  10/31/21 0719 10/31/21 1648 11/01/21 0446  WBC 4.3 4.5 4.2 3.7*  --   --   NEUTROABS 2.6  --   --   --   --   --   HGB 7.5* 7.7* 7.1* 7.0* 8.1* 8.0*  HCT 24.8* 24.2* 22.5* 22.6* 25.4* 24.6*  MCV 91.9 89.6 90.7 90.4  --   --   PLT 105* 112* 96* 98*  --   --     Basic Metabolic Panel: Recent Labs  Lab 10/30/21 1051 10/30/21 1917 10/31/21 0125 11/01/21 0446  NA 135  --  135 133*  K 4.6  --  4.1 4.9  CL 107  --  107 103  CO2 24  --  23 23  GLUCOSE 88  --  80 108*  BUN 32*  --  29* 32*  CREATININE 2.02*  --  1.99* 2.29*  CALCIUM 9.1  --  8.7* 8.7*  MG  --  1.8  --  1.6*    CBG: Recent Labs  Lab 10/31/21 1709 10/31/21 2212 11/01/21 0736 11/01/21 1119 11/01/21 1610  GLUCAP 146* 128* 110* 143* 149*    Recent Results (from the past 240 hour(s))  Respiratory (~20 pathogens) panel by PCR  Status: None   Collection Time: 10/30/21 11:30 PM   Specimen: Nasopharyngeal Swab; Respiratory  Result Value Ref Range Status   Adenovirus NOT DETECTED NOT DETECTED Final   Coronavirus 229E NOT DETECTED NOT DETECTED Final    Comment: (NOTE) The Coronavirus on the Respiratory Panel, DOES NOT test for the novel  Coronavirus (2019 nCoV)    Coronavirus HKU1 NOT DETECTED NOT DETECTED Final   Coronavirus NL63 NOT DETECTED NOT DETECTED Final   Coronavirus OC43 NOT DETECTED NOT DETECTED Final   Metapneumovirus NOT DETECTED NOT DETECTED Final   Rhinovirus / Enterovirus NOT DETECTED NOT DETECTED Final   Influenza A NOT DETECTED NOT DETECTED Final   Influenza B NOT DETECTED NOT DETECTED Final   Parainfluenza Virus 1 NOT DETECTED NOT DETECTED Final   Parainfluenza Virus 2 NOT DETECTED NOT DETECTED Final   Parainfluenza Virus 3 NOT DETECTED NOT DETECTED Final   Parainfluenza Virus 4 NOT DETECTED NOT DETECTED Final   Respiratory Syncytial Virus NOT DETECTED NOT DETECTED Final   Bordetella pertussis NOT DETECTED NOT DETECTED Final   Bordetella Parapertussis NOT DETECTED NOT DETECTED Final    Chlamydophila pneumoniae NOT DETECTED NOT DETECTED Final   Mycoplasma pneumoniae NOT DETECTED NOT DETECTED Final    Comment: Performed at [facility], [place]. [address] [place], [place] [address]     Radiology Studies: No results found.  Scheduled Meds:  [START ON 11/02/2021] furosemide  40 mg Intravenous Daily   insulin aspart  0-9 Units Subcutaneous TID WC   nadolol  20 mg Oral Daily   pantoprazole (PROTONIX) IV  40 mg Intravenous Q12H   pramipexole  0.5 mg Oral QHS   sodium chloride flush  3 mL Intravenous Q12H   traZODone  100 mg Oral QHS   Continuous Infusions:  sodium chloride       LOS: 1 day   Time spent: 36 mins  Tatyanna Cronk Wynetta Emery, MD How to contact the [facility] Attending or Consulting provider [place] or covering provider during after hours [place], for this patient?  Check the care team in [facility] and look for a) attending/consulting [facility] provider listed and b) the [facility] team listed Log into [email or web address] and use [place]'s universal password to access. If you do not have the password, please contact the hospital operator. Locate the [facility] provider you are looking for under [facility] and page to a number that you can be directly reached. If you still have difficulty reaching the provider, please page the [facility] (Director on Call) for the Hospitalists listed on amion for assistance.  11/01/2021, 4:38 PM

## 2021-11-01 NOTE — Assessment & Plan Note (Addendum)
--   monitoring renal function with diuresis  -- renally dose medications as appropriate -- ambulatory referral made to Dr. Wolfgang Phoenix for ongoing surveillance of renal function

## 2021-11-01 NOTE — Addendum Note (Signed)
Addendum  created 11/01/21 1040 by Karna Dupes, CRNA   Intraprocedure Staff edited

## 2021-11-02 ENCOUNTER — Telehealth: Payer: Self-pay | Admitting: Gastroenterology

## 2021-11-02 ENCOUNTER — Encounter (HOSPITAL_COMMUNITY): Payer: Self-pay | Admitting: Family Medicine

## 2021-11-02 DIAGNOSIS — N1832 Chronic kidney disease, stage 3b: Secondary | ICD-10-CM | POA: Diagnosis not present

## 2021-11-02 DIAGNOSIS — K7460 Unspecified cirrhosis of liver: Secondary | ICD-10-CM | POA: Diagnosis not present

## 2021-11-02 DIAGNOSIS — I10 Essential (primary) hypertension: Secondary | ICD-10-CM | POA: Diagnosis not present

## 2021-11-02 DIAGNOSIS — E8770 Fluid overload, unspecified: Secondary | ICD-10-CM | POA: Diagnosis not present

## 2021-11-02 DIAGNOSIS — K219 Gastro-esophageal reflux disease without esophagitis: Secondary | ICD-10-CM | POA: Diagnosis not present

## 2021-11-02 DIAGNOSIS — N189 Chronic kidney disease, unspecified: Secondary | ICD-10-CM | POA: Diagnosis not present

## 2021-11-02 DIAGNOSIS — N179 Acute kidney failure, unspecified: Secondary | ICD-10-CM | POA: Diagnosis not present

## 2021-11-02 DIAGNOSIS — G4733 Obstructive sleep apnea (adult) (pediatric): Secondary | ICD-10-CM

## 2021-11-02 DIAGNOSIS — R601 Generalized edema: Secondary | ICD-10-CM | POA: Diagnosis not present

## 2021-11-02 DIAGNOSIS — R188 Other ascites: Secondary | ICD-10-CM | POA: Diagnosis not present

## 2021-11-02 DIAGNOSIS — D649 Anemia, unspecified: Secondary | ICD-10-CM | POA: Diagnosis not present

## 2021-11-02 LAB — RENAL FUNCTION PANEL
Albumin: 3.2 g/dL — ABNORMAL LOW (ref 3.5–5.0)
Anion gap: 6 (ref 5–15)
BUN: 31 mg/dL — ABNORMAL HIGH (ref 8–23)
CO2: 25 mmol/L (ref 22–32)
Calcium: 8.9 mg/dL (ref 8.9–10.3)
Chloride: 101 mmol/L (ref 98–111)
Creatinine, Ser: 2.3 mg/dL — ABNORMAL HIGH (ref 0.61–1.24)
GFR, Estimated: 31 mL/min — ABNORMAL LOW (ref >60)
Glucose, Bld: 139 mg/dL — ABNORMAL HIGH (ref 70–99)
Phosphorus: 4.3 mg/dL (ref 2.5–4.6)
Potassium: 4.4 mmol/L (ref 3.5–5.1)
Sodium: 132 mmol/L — ABNORMAL LOW (ref 135–145)

## 2021-11-02 LAB — GLUCOSE, CAPILLARY
Glucose-Capillary: 208 mg/dL — ABNORMAL HIGH (ref 70–99)
Glucose-Capillary: 92 mg/dL (ref 70–99)

## 2021-11-02 LAB — HEPATIC FUNCTION PANEL
ALT: 18 U/L (ref 0–44)
AST: 25 U/L (ref 15–41)
Albumin: 3.2 g/dL — ABNORMAL LOW (ref 3.5–5.0)
Alkaline Phosphatase: 78 U/L (ref 38–126)
Bilirubin, Direct: 0.1 mg/dL (ref 0.0–0.2)
Indirect Bilirubin: 0.6 mg/dL (ref 0.3–0.9)
Total Bilirubin: 0.7 mg/dL (ref 0.3–1.2)
Total Protein: 6.9 g/dL (ref 6.5–8.1)

## 2021-11-02 LAB — HEMOGLOBIN AND HEMATOCRIT, BLOOD
HCT: 25.3 % — ABNORMAL LOW (ref 39.0–52.0)
Hemoglobin: 8.1 g/dL — ABNORMAL LOW (ref 13.0–17.0)

## 2021-11-02 LAB — PROTIME-INR
INR: 1.3 — ABNORMAL HIGH (ref 0.8–1.2)
Prothrombin Time: 15.8 seconds — ABNORMAL HIGH (ref 11.4–15.2)

## 2021-11-02 LAB — MAGNESIUM: Magnesium: 2 mg/dL (ref 1.7–2.4)

## 2021-11-02 MED ORDER — FUROSEMIDE 20 MG PO TABS: 20.0000 mg | ORAL_TABLET | Freq: Every day | ORAL | 1 refills | Status: DC

## 2021-11-02 MED ORDER — LEVEMIR FLEXTOUCH 100 UNIT/ML ~~LOC~~ SOPN: 14.0000 [IU] | PEN_INJECTOR | Freq: Every evening | SUBCUTANEOUS | Status: DC

## 2021-11-02 MED ORDER — PRAMIPEXOLE DIHYDROCHLORIDE 0.25 MG PO TABS: 0.5000 mg | ORAL_TABLET | Freq: Every day | ORAL | Status: DC

## 2021-11-02 MED ORDER — FUROSEMIDE 20 MG PO TABS: 20.0000 mg | ORAL_TABLET | Freq: Every day | ORAL | Status: DC

## 2021-11-02 NOTE — Telephone Encounter (Signed)
Austin Morgan-  This is a Dr. Gala Romney patient, please arrange hospital follow up for this gentleman in 3-4 weeks.   Venetia Night, MSN, FNP-BC, AGACNP-BC [facility]

## 2021-11-02 NOTE — Progress Notes (Signed)
Gastroenterology Progress Note   Referring Provider: No ref. provider found Primary Care Physician:  Kathyrn Drown, MD Primary Gastroenterologist:  Dr. Gala Romney  Patient ID: Austin Morgan; [id number]; [birth year]    Subjective   Patient states his abdominal swelling as well as bilateral lower extremity edema has significantly improved since admission.  He reports that he previously was not able to see his shin bone or his knees.  He denies any abdominal pain, nausea, vomiting, melena, or hematochezia.  Reports he has not had a BM yet as he has not had much solid food but he is passing gas.  Reports his breathing is also significantly been improved.  He currently is able to take deep breaths and sit up in the bed without issue.  He reports that he would like to go home, but wants to make sure his kidneys are okay.  Reports he is okay with following up with nephrology outpatient.  He also states that he does not want to end up on dialysis, he especially does not want to get to a point where he needs any transplants, liver or kidney.  Objective   Vital signs in last 24 hours Temp:  [97.6 F (36.4 C)-97.8 F (36.6 C)] 97.6 F (36.4 C) (05/25 0447) Pulse Rate:  [57-58] 58 (05/25 0447) Resp:  [16-19] 19 (05/25 0447) BP: (116-134)/(73-90) 116/73 (05/25 0447) SpO2:  [79 %-92 %] 92 % (05/25 0447) Weight:  [91.2 kg] 91.2 kg (05/25 0640) Last BM Date : 10/30/21  Physical Exam General:   Alert and oriented, pleasant Head:  Normocephalic and atraumatic. Eyes:  No icterus, sclera clear. Conjuctiva pink.  Mouth:  Without lesions, mucosa pink and moist.  Heart:  S1, S2 present, no murmurs noted.  Lungs: Clear to auscultation bilaterally, without wheezing, rales, or rhonchi.  Abdomen:  Bowel sounds present, soft, non-tender, mildly distended. No HSM or hernias noted. No rebound or guarding. No masses appreciated  Msk:  Symmetrical without gross deformities. Normal posture. Pulses:  Normal pulses  noted. Extremities: +1/+2 edema to BLE Neurologic:  Alert and  oriented x4;  grossly normal neurologically. No asterixis.  Skin:  Warm and dry, intact without significant lesions.  Psych:  Alert and cooperative. Normal mood and affect.  Intake/Output from previous day: 05/24 0701 - 05/25 0700 In: 600 [P.O.:600] Out: -  Intake/Output this shift: No intake/output data recorded.  Lab Results  Recent Labs    10/30/21 1917 10/31/21 0125 10/31/21 0719 10/31/21 1648 11/01/21 0446 11/01/21 1752 11/02/21 0452  WBC 4.5 4.2 3.7*  --   --   --   --   HGB 7.7* 7.1* 7.0*   < > 8.0* 8.6* 8.1*  HCT 24.2* 22.5* 22.6*   < > 24.6* 26.7* 25.3*  PLT 112* 96* 98*  --   --   --   --    < > = values in this interval not displayed.   BMET Recent Labs    10/31/21 0125 11/01/21 0446 11/02/21 0452  NA 135 133* 132*  K 4.1 4.9 4.4  CL 107 103 101  CO2 '23 23 25  '$ GLUCOSE 80 108* 139*  BUN 29* 32* 31*  CREATININE 1.99* 2.29* 2.30*  CALCIUM 8.7* 8.7* 8.9   LFT Recent Labs    10/30/21 1051 10/31/21 0125 11/01/21 0446 11/02/21 0452  PROT 7.5 6.9 7.0  --   ALBUMIN 3.4* 3.2* 3.2* 3.2*  AST '27 28 28  '$ --   ALT '21 19 19  '$ --  ALKPHOS 82 80 76  --   BILITOT 0.8 0.6 1.2  --    PT/INR Recent Labs    10/30/21 2017 11/01/21 1001  LABPROT 15.9* 17.1*  INR 1.3* 1.4*   Hepatitis Panel No results for input(s): HEPBSAG, HCVAB, HEPAIGM, HEPBIGM in the last 72 hours.   Studies/Results DG Chest 2 View  Result Date: 10/30/2021 CLINICAL DATA:  swelling EXAM: CHEST - 2 VIEW COMPARISON:  None Available. FINDINGS: Enlargement of the cardiac silhouette. Diffuse interstitial and patchy airspace opacities. Small bilateral pleural effusions. No visible pneumothorax. ACDF. IMPRESSION: Cardiomegaly, diffuse interstitial and patchy airspace opacities, and small bilateral pleural effusions. Findings are most suggestive of congestive heart failure with mild edema. Infection is not excluded. Electronically  Signed   By: Margaretha Sheffield M.D.   On: 10/30/2021 16:21   US ABDOMEN LIMITED RUQ (LIVER/GB)  Result Date: 10/12/2021 CLINICAL DATA:  Cirrhosis follow-up EXAM: ULTRASOUND ABDOMEN LIMITED RIGHT UPPER QUADRANT COMPARISON:  Abdominal ultrasound 12/07/2020 FINDINGS: Gallbladder: No gallstones or wall thickening visualized. No sonographic Murphy sign noted by sonographer. Common bile duct: Diameter: 4 mm Liver: Increased echogenicity of the liver parenchyma with no focal mass identified. Portal vein is patent on color Doppler imaging with normal direction of blood flow towards the liver. Other: Trace perihepatic ascites. IMPRESSION: 1. No hepatic mass identified. Increased echogenicity of the liver parenchyma which may represent hepatic steatosis and/or other hepatocellular disease. 2. Trace perihepatic ascites. Electronically Signed   By: Ofilia Neas M.D.   On: 10/12/2021 12:01    Assessment  66 y.o. 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  He was found to have anemia with hemoglobin stable over the last week, elevated creatinine, CXR suggestive of congestive heart failure versus infection was given IV Lasix.  Acute on chronic anemia: Hgb 7.1 on admission, down from 9.1 one month ago.  He has received 1 unit PRBCs total.  Hemoglobin 8.1 today.  He underwent EGD 5/23 with grade 1 esophageal varices with single subtle linear area questionable for possible stigmata of bleeding, no bands able to be deployed due to size of varices, also presence of portal  hypertensive gastropathy.  Octreotide stopped after EGD.  Last colonoscopy 2021 with presence of 9 polyps.  He has had no episodes of rectal bleeding or hematemesis, only previously noted coffee-ground substance on his mouth upon waking up a few days prior.  Continue to monitor for overt rectal bleeding or further drop in hemoglobin.  No further endoscopic procedures planned at this time. He received a dose of IV iron yesterday 5/24. Hemoglobin has remained stable.Should continue to follow with hematology.   Anasarca/pulmonary interstitial edema: Presented with 1 week of worsening generalized body edema, CXR with findings of volume overload suggestive of CHF exacerbation.  He had been on Lasix 40 mg daily outpatient for 2 weeks, stopped 1 week ago due to acute on chronic kidney injury.  Prior echocardiogram in April with a EF 60 to 65%.  Suspect decompensated cirrhosis contributing to volume overload.  He noted improvement with IV Lasix on Monday.  Nephrology has been following and initially  recommended IV Lasix twice daily however he had rising creatinine up to 2.29 (previously 2.02 on admission), currently now 2.3 and nephrology recommended to go back to once daily dosing. Mild ascites noted today, lower extremities with +1/+2 edema R >L, improved from yesterday. NO upper extremity edema or facial swelling. Reports breathing is significantly improved - able to take dep breaths, on room air, lung sounds clear/diminished in bases. Appreciate nephrology recommendations and delicate management of fluid status with diuretics.   Cirrhosis: Likely alcohol related. Has had cessation of alcohol since October 2022.  Cirrhosis has been complicated by esophageal varices with prior variceal bleeding s/p banding in April 2023.  He continues to have coagulopathy with thrombocytopenia and mildly elevated INR with recent anasarca.  His MELD was previously 14 during last admission.  No signs or symptoms of hepatic  encephalopathy.  Current MELD Na -  22.  This is likely influenced by increasing creatinine, will need to continue to monitor.  LFTs remain stable.  Advised patient to consider referral for transplant evaluation but he reports he is not interested in any transplant. Will arrange hospital follow up in 3-4 weeks. Should have follow up with nephrology as stated above.    Plan / Recommendations  Continue to trend H&H, transfuse for hemoglobin <7 Continue to follow daily MELD labs  - CMP and INR Continue to monitor for overt GI bleeding Continue to monitor for hepatic encephalopathy Diuretic management per nephrology Continue PPI twice daily Continue nadolol 20 mg daily Continue heart healthy diet, 2 g sodium limit Follow up with hematology.    LOS: 2 days    11/02/2021, 10:00 AM   Venetia Night, MSN, FNP-BC, AGACNP-BC [facility]

## 2021-11-02 NOTE — Discharge Instructions (Signed)
PLEASE HAVE LABS CHECKED WITH PRIMARY CARE PROVIDER IN 1-2 WEEKS.  PLEASE CHECK CBC AND BMP IN 1-2 WEEKS  PLEASE ESTABLISH CARE WITH DR. Theador Hawthorne FOR KIDNEY CARE.  PLEASE CALL AND GET AN APPT TO FOLLOW UP KIDNEYS.    IMPORTANT INFORMATION: PAY CLOSE ATTENTION   PHYSICIAN DISCHARGE INSTRUCTIONS  Follow with Primary care provider  Kathyrn Drown, MD  and other consultants as instructed by your Hospitalist Physician  [facility] IF SYMPTOMS COME BACK, WORSEN OR NEW PROBLEM DEVELOPS   Please note: You were cared for by a hospitalist during your hospital stay. Every effort will be made to forward records to your primary care provider.  You can request that your primary care provider send for your hospital records if they have not received them.  Once you are discharged, your primary care physician will handle any further medical issues. Please note that NO REFILLS for any discharge medications will be authorized once you are discharged, as it is imperative that you return to your primary care physician (or establish a relationship with a primary care physician if you do not have one) for your post hospital discharge needs so that they can reassess your need for medications and monitor your lab values.  Please get a complete blood count and chemistry panel checked by your Primary MD at your next visit, and again as instructed by your Primary MD.  Get Medicines reviewed and adjusted: Please take all your medications with you for your next visit with your Primary MD  Laboratory/radiological data: Please request your Primary MD to go over all hospital tests and procedure/radiological results at the follow up, please ask your primary care provider to get all Hospital records sent to his/her office.  In some cases, they will be blood work, cultures and biopsy results pending at the time of your discharge. Please request that your primary care provider follow up on these  results.  If you are diabetic, please bring your blood sugar readings with you to your follow up appointment with primary care.    Please call and make your follow up appointments as soon as possible.    Also Note the following: If you experience worsening of your admission symptoms, develop shortness of breath, life threatening emergency, suicidal or homicidal thoughts you must seek medical attention immediately by calling 911 or calling your MD immediately  if symptoms less severe.  You must read complete instructions/literature along with all the possible adverse reactions/side effects for all the Medicines you take and that have been prescribed to you. Take any new Medicines after you have completely understood and accpet all the possible adverse reactions/side effects.   Do not drive when taking Pain medications or sleeping medications (Benzodiazepines)  Do not take more than prescribed Pain, Sleep and Anxiety Medications. It is not advisable to combine anxiety,sleep and pain medications without talking with your primary care practitioner  Special Instructions: If you have smoked or chewed Tobacco  in the last 2 yrs please stop smoking, stop any regular Alcohol  and or any Recreational drug use.  Wear Seat belts while driving.  Do not drive if taking any narcotic, mind altering or controlled substances or recreational drugs or alcohol.

## 2021-11-02 NOTE — Progress Notes (Signed)
BS 125 , did not transfer over

## 2021-11-02 NOTE — Discharge Summary (Signed)
Physician Discharge Summary  Austin Morgan ALP:[id number] DOB: 1955/06/28 DOA: 10/30/2021  PCP: Kathyrn Drown, MD GI: Dr. Gala Romney  Admit date: 10/30/2021 Discharge date: 11/02/2021  Admitted From:  Home  Disposition: Home   Recommendations for Outpatient Follow-up:  Follow up with PCP in 1-2 weeks Please obtain BMP/CBC in 1-2 weeks Please establish care with Dr. Marlise Eves nephrology in 1 month Please follow up with GI Dr. Gala Romney in 3-4 weeks  Discharge Condition: STABLE   CODE STATUS: FULL  DIET: renal / carb modified    Brief Hospitalization Summary: Please see all hospital notes, images, labs for full details of the hospitalization. Austin Morgan is a 66 y.o. male with medical history significant for type 2 diabetes, depression, gout, hypertension, hyperlipidemia, obstructive sleep apnea, CKD stage IIIa, hepatic cirrhosis with varices, history of alcohol abuse abstinent for 11 months, depression and anxiety, restless leg syndrome presented to hospital for abnormal labs and volume overload with progressive lower extremity edema and dyspnea on exertion.  Of note patient was admitted at [facility] between 412 and 413 for acute anemia and acute on chronic kidney disease and had received 3 units of packed RBC at that time.  Had EGD done 09/28/2021, by Dr. Gala Romney with findings of Esophageal varices with bleeding stigmata status post esophageal band ligation. Normal duodenal bulb and second portion of the duodenum. Portal gastropathy/friable gastric mucosa no specimens collected. Gastric polyps not manipulated.  Patient had outpatient labs with creatinine elevation at 2.5 and HCTZ and lisinopril on hold.  Baseline creatinine is around 1.3-1.4.  In the ED patient had stable vitals.  Hemoglobin was 7.5 with creatinine of 2.02, BNP at 709.  Chest x-ray showed cardiomegaly with diffuse interstitial and patchy airspace opacities and bilateral small pleural effusion.  In the ED, patient was given 40 of Lasix,  nephrology was consulted and patient was admitted to hospital for further evaluation and treatment.     Assessment and Plan: * Volume overload 66 year old male with history of acute on CKD, anemia likely secondary to esophageal varices who presented with 1 week history of worsening LE edema, DOE, elevated BNP and CXR findings of volume overload as well as JVD, edema and crackles on exam -strict I/O -daily weights -has been off lasix/ACEI x 1 week due to acute on chronic renal failure and symptoms seemed to correlate with progression since stopping -just had echo done a few weeks ago with normal EF and normal DD. Mild AS -given IV lasix.  Nephrology recommended 25m BID.  With bump in creatinine we have reduced to once daily dosing for now. He will need outpatient nephrology follow up.  - now that he is dry, we will give a 3 day diuretic holiday and have him restart lasix on 11/06/21 at a lower dose of 20 mg daily.  He was advised to have labs rechecked with PCP in 1-2 weeks, CBC and BMP recommended.    Stage 3b chronic kidney disease (CKD) ([place] -- monitoring renal function with diuresis  -- renally dose medications as appropriate -- ambulatory referral made to Dr. BTheador Hawthornefor ongoing surveillance of renal function   Anasarca -- He diuresed well on IV lasix and after a brief diuretic holiday plan to resume lasix oral 20 mg daily on 11/06/21.  This is to try to avoid re-accumulating large amounts of fluid as he has in the past.   Acute kidney injury superimposed on chronic kidney disease stage 3b  -had acute on chronic renal failure  2 weeks ago with peak creatinine of 2.53 after 2 weeks of lasix which he was holding prior to admission -worsening anemia likely also contributing  -he was given IV iron infusion on 5/24 to improve anemia -holding nephrotoxic agents -planning to give diuretic holiday for 3 days, resume lasix 20 mg daily on 11/06/21 - ambulatory referral made to [place]  [facility] Dr. Theador Hawthorne for ongoing surveillance - Pt advised to see PCP to have labs rechecked in 1-2 weeks. Please check CBC and BMP in 1-2 weeks.      Anemia Admitted on 09/20/21 for symptomatic anemia with hgb down to 5.4 s/p 3 units PRBC hgb today is 7.5, transfuse if <7, serial cbc.  Esophageal varices banded on EGD on 09/28/21 He has no signs of active bleeding at this time GI consulted octreotide gtt discontinued by GI service  PPI IV q 12 hours EGD with Dr. [name] on 10/31/21.  Esophagus showed Grade I varices were found in the lower third of the esophagus. There was presence of a single subtle linear area which raised the concern for possible stigmata of bleeding. However, given the size of the varices, no bands could be deployed despite multiple attempts to perform this. Portal hypertensive gastropathy was found in the entire examined stomach. The examined duodenum was normal.    Hepatic cirrhosis with esophageal varices Abstinent from alcohol for 11 months  S/p varice banding on 09/28/21 Octreotide gtt completed  protonix bid Ceftriaxone discontinued by GI service  GI consult appreciated.  EGD with Dr. [name] 10/31/21: Esophagus showed Grade I varices were found in the lower third of the esophagus. There was presence of a single subtle linear area which raised the concern for possible stigmata of bleeding. However, given the size of the varices, no bands could be deployed despite multiple attempts to perform this. Portal hypertensive gastropathy was found in the entire examined stomach. The examined duodenum was normal.   GERD (gastroesophageal reflux disease) protonix IV BID was given in hospital   Obstructive sleep apnea -- offered nightly CPaP in hospital  Hypertension -- stable, following, resumed home meds  Diabetes mellitus without complication ([place]) G5X of 6.0 in 09/2021 Hold oral agent (glipizide) in hospital and DC at discharge due to bump in creatinine Resume  basal insulin at lower dose and continue frequent CBG monitoring at home  CBG (last 3)  Recent Labs    11/01/21 1610 11/01/21 2109 11/02/21 1115  GLUCAP 149* 169* 208*     Discharge Diagnoses:  Principal Problem:   Volume overload Active Problems:   Diabetes mellitus without complication (HCC)   Hypertension   Obstructive sleep apnea   GERD (gastroesophageal reflux disease)   Hepatic cirrhosis with esophageal varices   Anemia   Acute kidney injury superimposed on chronic kidney disease (HCC)   Anasarca   Stage 3b chronic kidney disease (CKD) ([place])   Discharge Instructions: Discharge Instructions     Ambulatory referral to Nephrology   Complete by: As directed       Allergies as of 11/02/2021       Reactions   Codeine Nausea And Vomiting        Medication List     STOP taking these medications    glipiZIDE 5 MG tablet Commonly known as: GLUCOTROL   metFORMIN 500 MG tablet Commonly known as: GLUCOPHAGE       TAKE these medications    albuterol 108 (90 Base) MCG/ACT inhaler Commonly known as: VENTOLIN HFA Inhale 2 puffs into  the lungs every 6 (six) hours as needed for wheezing.   blood glucose meter kit and supplies Kit Dispense based on patient and insurance preference. Use up to BID as directed.   fish oil-omega-3 fatty acids 1000 MG capsule Take 1 g by mouth 2 (two) times daily.   furosemide 20 MG tablet Commonly known as: LASIX Take 1 tablet (20 mg total) by mouth daily. Start taking on: Nov 06, 2021 What changed:  how much to take how to take this when to take this additional instructions These instructions start on Nov 06, 2021. If you are unsure what to do until then, ask your doctor or other care provider.   Levemir FlexTouch 100 UNIT/ML FlexPen Generic drug: insulin detemir Inject 14 Units into the skin every evening. What changed:  how much to take when to take this additional instructions   multivitamin with minerals  tablet Take 1 tablet by mouth daily.   nadolol 40 MG tablet Commonly known as: CORGARD Take 1/2 (one half) tablet po daily   naltrexone 50 MG tablet Commonly known as: DEPADE TAKE ONE TABLET BY MOUTH ONCE DAILY.   OneTouch Ultra test strip Generic drug: glucose blood USE AS DIRECTED TO TEST BLOOD GLUCOSE TWICE DAILY.   pantoprazole 40 MG tablet Commonly known as: PROTONIX Take 1 tablet (40 mg total) by mouth 2 (two) times daily.   pramipexole 0.25 MG tablet Commonly known as: MIRAPEX Take 2 tablets (0.5 mg total) by mouth at bedtime. TAKE 1 TABLET BY MOUTH NIGHTLY AT  LEAST  2  HOURS  BEFORE  BEDTIME  FOR  RESTLESS  LEGS   sodium chloride 0.65 % Soln nasal spray Commonly known as: OCEAN Place 1 spray into both nostrils as needed for congestion.   traZODone 100 MG tablet Commonly known as: DESYREL Take 1 tablet (100 mg total) by mouth at bedtime as needed. for sleep   Vitamin C 500 MG Caps Take 1,000 mg by mouth daily.        Follow-up Information     Luking, Elayne Snare, MD. Schedule an appointment as soon as possible for a visit in 1 week(s).   Specialty: Family Medicine Why: Hospital Follow Up and check labs Contact information: [place] [address] [phone]         Liana Gerold, MD. Schedule an appointment as soon as possible for a visit in 3 week(s).   Specialty: Nephrology Why: Hospital Follow Up, check kidney function Contact information: [address] [place] [address] [phone]                Allergies  Allergen Reactions   Codeine Nausea And Vomiting   Allergies as of 11/02/2021       Reactions   Codeine Nausea And Vomiting        Medication List     STOP taking these medications    glipiZIDE 5 MG tablet Commonly known as: GLUCOTROL   metFORMIN 500 MG tablet Commonly known as: GLUCOPHAGE       TAKE these medications    albuterol 108 (90 Base) MCG/ACT inhaler Commonly  known as: VENTOLIN HFA Inhale 2 puffs into the lungs every 6 (six) hours as needed for wheezing.   blood glucose meter kit and supplies Kit Dispense based on patient and insurance preference. Use up to BID as directed.   fish oil-omega-3 fatty acids 1000 MG capsule Take 1 g by mouth 2 (two) times daily.   furosemide  20 MG tablet Commonly known as: LASIX Take 1 tablet (20 mg total) by mouth daily. Start taking on: Nov 06, 2021 What changed:  how much to take how to take this when to take this additional instructions These instructions start on Nov 06, 2021. If you are unsure what to do until then, ask your doctor or other care provider.   Levemir FlexTouch 100 UNIT/ML FlexPen Generic drug: insulin detemir Inject 14 Units into the skin every evening. What changed:  how much to take when to take this additional instructions   multivitamin with minerals tablet Take 1 tablet by mouth daily.   nadolol 40 MG tablet Commonly known as: CORGARD Take 1/2 (one half) tablet po daily   naltrexone 50 MG tablet Commonly known as: DEPADE TAKE ONE TABLET BY MOUTH ONCE DAILY.   OneTouch Ultra test strip Generic drug: glucose blood USE AS DIRECTED TO TEST BLOOD GLUCOSE TWICE DAILY.   pantoprazole 40 MG tablet Commonly known as: PROTONIX Take 1 tablet (40 mg total) by mouth 2 (two) times daily.   pramipexole 0.25 MG tablet Commonly known as: MIRAPEX Take 2 tablets (0.5 mg total) by mouth at bedtime. TAKE 1 TABLET BY MOUTH NIGHTLY AT  LEAST  2  HOURS  BEFORE  BEDTIME  FOR  RESTLESS  LEGS   sodium chloride 0.65 % Soln nasal spray Commonly known as: OCEAN Place 1 spray into both nostrils as needed for congestion.   traZODone 100 MG tablet Commonly known as: DESYREL Take 1 tablet (100 mg total) by mouth at bedtime as needed. for sleep   Vitamin C 500 MG Caps Take 1,000 mg by mouth daily.        Procedures/Studies: DG Chest 2 View  Result Date: 10/30/2021 CLINICAL DATA:   swelling EXAM: CHEST - 2 VIEW COMPARISON:  None Available. FINDINGS: Enlargement of the cardiac silhouette. Diffuse interstitial and patchy airspace opacities. Small bilateral pleural effusions. No visible pneumothorax. ACDF. IMPRESSION: Cardiomegaly, diffuse interstitial and patchy airspace opacities, and small bilateral pleural effusions. Findings are most suggestive of congestive heart failure with mild edema. Infection is not excluded. Electronically Signed   By: Margaretha Sheffield M.D.   On: 10/30/2021 16:21   US ABDOMEN LIMITED RUQ (LIVER/GB)  Result Date: 10/12/2021 CLINICAL DATA:  Cirrhosis follow-up EXAM: ULTRASOUND ABDOMEN LIMITED RIGHT UPPER QUADRANT COMPARISON:  Abdominal ultrasound 12/07/2020 FINDINGS: Gallbladder: No gallstones or wall thickening visualized. No sonographic Murphy sign noted by sonographer. Common bile duct: Diameter: 4 mm Liver: Increased echogenicity of the liver parenchyma with no focal mass identified. Portal vein is patent on color Doppler imaging with normal direction of blood flow towards the liver. Other: Trace perihepatic ascites. IMPRESSION: 1. No hepatic mass identified. Increased echogenicity of the liver parenchyma which may represent hepatic steatosis and/or other hepatocellular disease. 2. Trace perihepatic ascites. Electronically Signed   By: Ofilia Neas M.D.   On: 10/12/2021 12:01     Subjective: Pt reports that he feels well and wants to go home.  No CP, no SOB.  No black stools seen.   Discharge Exam: Vitals:   11/02/21 0447 11/02/21 1247  BP: 116/73 121/70  Pulse: (!) 58 (!) 55  Resp: 19 16  Temp: 97.6 F (36.4 C) 97.8 F (36.6 C)  SpO2: 92% 92%   Vitals:   11/02/21 0057 11/02/21 0447 11/02/21 0640 11/02/21 1247  BP:  116/73  121/70  Pulse:  (!) 58  (!) 55  Resp:  19  16  Temp:  97.6 F (  36.4 C)  97.8 F (36.6 C)  TempSrc:  Oral    SpO2: (!) 79% 92%  92%  Weight:   91.2 kg   Height:       General: Pt is alert, awake, not in acute  distress Cardiovascular: RRR, S1/S2 +, no rubs, no gallops Respiratory: CTA bilaterally, no wheezing, no rhonchi Abdominal: Soft, NT, ND, bowel sounds + Extremities: no pretibial edema, no cyanosis   The results of significant diagnostics from this hospitalization (including imaging, microbiology, ancillary and laboratory) are listed below for reference.     Microbiology: Recent Results (from the past 240 hour(s))  Respiratory (~20 pathogens) panel by PCR     Status: None   Collection Time: 10/30/21 11:30 PM   Specimen: Nasopharyngeal Swab; Respiratory  Result Value Ref Range Status   Adenovirus NOT DETECTED NOT DETECTED Final   Coronavirus 229E NOT DETECTED NOT DETECTED Final    Comment: (NOTE) The Coronavirus on the Respiratory Panel, DOES NOT test for the novel  Coronavirus (2019 nCoV)    Coronavirus HKU1 NOT DETECTED NOT DETECTED Final   Coronavirus NL63 NOT DETECTED NOT DETECTED Final   Coronavirus OC43 NOT DETECTED NOT DETECTED Final   Metapneumovirus NOT DETECTED NOT DETECTED Final   Rhinovirus / Enterovirus NOT DETECTED NOT DETECTED Final   Influenza A NOT DETECTED NOT DETECTED Final   Influenza B NOT DETECTED NOT DETECTED Final   Parainfluenza Virus 1 NOT DETECTED NOT DETECTED Final   Parainfluenza Virus 2 NOT DETECTED NOT DETECTED Final   Parainfluenza Virus 3 NOT DETECTED NOT DETECTED Final   Parainfluenza Virus 4 NOT DETECTED NOT DETECTED Final   Respiratory Syncytial Virus NOT DETECTED NOT DETECTED Final   Bordetella pertussis NOT DETECTED NOT DETECTED Final   Bordetella Parapertussis NOT DETECTED NOT DETECTED Final   Chlamydophila pneumoniae NOT DETECTED NOT DETECTED Final   Mycoplasma pneumoniae NOT DETECTED NOT DETECTED Final    Comment: Performed at [facility], [address], [place], [place] [address]     Labs: BNP (last 3 results) Recent Labs    09/19/21 0813 10/30/21 1051  BNP 250.0* 915.0*   Basic Metabolic Panel: Recent Labs  Lab  10/30/21 1051 10/30/21 1917 10/31/21 0125 11/01/21 0446 11/02/21 0452  NA 135  --  135 133* 132*  K 4.6  --  4.1 4.9 4.4  CL 107  --  107 103 101  CO2 24  --  _0 GLUCOSE 88  --  80 108* 139*  BUN 32*  --  29* 32* 31*  CREATININE 2.02*  --  1.99* 2.29* 2.30*  CALCIUM 9.1  --  8.7* 8.7* 8.9  MG  --  1.8  --  1.6* 2.0  PHOS  --   --   --   --  4.3   Liver Function Tests: Recent Labs  Lab 10/30/21 1051 10/31/21 0125 11/01/21 0446 11/02/21 0452 11/02/21 0459  AST _1 --  25  ALT _2 --  18  ALKPHOS 82 80 76  --  78  BILITOT 0.8 0.6 1.2  --  0.7  PROT 7.5 6.9 7.0  --  6.9  ALBUMIN 3.4* 3.2* 3.2* 3.2* 3.2*   Recent Labs  Lab 10/30/21 1051  LIPASE 21   No results for input(s): AMMONIA in the last 168 hours. CBC: Recent Labs  Lab 10/30/21 1051 10/30/21 1917 10/31/21 0125 10/31/21 0719 10/31/21 1648 11/01/21 0446 11/01/21 1752 11/02/21 0452  WBC  4.3 4.5 4.2 3.7*  --   --   --   --   NEUTROABS 2.6  --   --   --   --   --   --   --   HGB 7.5* 7.7* 7.1* 7.0* 8.1* 8.0* 8.6* 8.1*  HCT 24.8* 24.2* 22.5* 22.6* 25.4* 24.6* 26.7* 25.3*  MCV 91.9 89.6 90.7 90.4  --   --   --   --   PLT 105* 112* 96* 98*  --   --   --   --    Cardiac Enzymes: No results for input(s): CKTOTAL, CKMB, CKMBINDEX, TROPONINI in the last 168 hours. BNP: Invalid input(s): POCBNP CBG: Recent Labs  Lab 11/01/21 0736 11/01/21 1119 11/01/21 1610 11/01/21 2109 11/02/21 1115  GLUCAP 110* 143* 149* 169* 208*   D-Dimer No results for input(s): DDIMER in the last 72 hours. Hgb A1c No results for input(s): HGBA1C in the last 72 hours. Lipid Profile No results for input(s): CHOL, HDL, LDLCALC, TRIG, CHOLHDL, LDLDIRECT in the last 72 hours. Thyroid function studies Recent Labs    10/30/21 1918  TSH 3.704   Anemia work up No results for input(s): VITAMINB12, FOLATE, FERRITIN, TIBC, IRON, RETICCTPCT in the last 72 hours. Urinalysis    Component Value Date/Time    COLORURINE STRAW (A) 10/30/2021 2245   APPEARANCEUR CLEAR 10/30/2021 2245   LABSPEC 1.004 (L) 10/30/2021 2245   PHURINE 6.0 10/30/2021 2245   GLUCOSEU NEGATIVE 10/30/2021 2245   HGBUR NEGATIVE 10/30/2021 2245   BILIRUBINUR NEGATIVE 10/30/2021 2245   KETONESUR NEGATIVE 10/30/2021 2245   PROTEINUR NEGATIVE 10/30/2021 2245   NITRITE NEGATIVE 10/30/2021 2245   LEUKOCYTESUR NEGATIVE 10/30/2021 2245   Sepsis Labs Invalid input(s): PROCALCITONIN,  WBC,  LACTICIDVEN Microbiology Recent Results (from the past 240 hour(s))  Respiratory (~20 pathogens) panel by PCR     Status: None   Collection Time: 10/30/21 11:30 PM   Specimen: Nasopharyngeal Swab; Respiratory  Result Value Ref Range Status   Adenovirus NOT DETECTED NOT DETECTED Final   Coronavirus 229E NOT DETECTED NOT DETECTED Final    Comment: (NOTE) The Coronavirus on the Respiratory Panel, DOES NOT test for the novel  Coronavirus (2019 nCoV)    Coronavirus HKU1 NOT DETECTED NOT DETECTED Final   Coronavirus NL63 NOT DETECTED NOT DETECTED Final   Coronavirus OC43 NOT DETECTED NOT DETECTED Final   Metapneumovirus NOT DETECTED NOT DETECTED Final   Rhinovirus / Enterovirus NOT DETECTED NOT DETECTED Final   Influenza A NOT DETECTED NOT DETECTED Final   Influenza B NOT DETECTED NOT DETECTED Final   Parainfluenza Virus 1 NOT DETECTED NOT DETECTED Final   Parainfluenza Virus 2 NOT DETECTED NOT DETECTED Final   Parainfluenza Virus 3 NOT DETECTED NOT DETECTED Final   Parainfluenza Virus 4 NOT DETECTED NOT DETECTED Final   Respiratory Syncytial Virus NOT DETECTED NOT DETECTED Final   Bordetella pertussis NOT DETECTED NOT DETECTED Final   Bordetella Parapertussis NOT DETECTED NOT DETECTED Final   Chlamydophila pneumoniae NOT DETECTED NOT DETECTED Final   Mycoplasma pneumoniae NOT DETECTED NOT DETECTED Final    Comment: Performed at [facility], [place] [address], [place],  [address]   Time coordinating discharge: 36 mins    SIGNED:  Irwin Brakeman, MD  [facility] 11/02/2021, 1:00 PM How to contact the [facility] Attending or Consulting provider [place] or covering provider during after hours [place], for this patient?  Check the care team in [facility] and look for  a) attending/consulting [place] provider listed and b) the [place] team listed Log into [email or web address] and use [place]'s universal password to access. If you do not have the password, please contact the hospital operator. Locate the [facility] provider you are looking for under [facility] and page to a number that you can be directly reached. If you still have difficulty reaching the provider, please page the [facility] (Director on Call) for the Hospitalists listed on amion for assistance.

## 2021-11-03 ENCOUNTER — Telehealth: Payer: Self-pay

## 2021-11-03 NOTE — Telephone Encounter (Signed)
Transition Care Management Follow-up Telephone Call Date of discharge and from where: 11/02/2021 [facility]. How have you been since you were released from the hospital? Spoke with pt's wife, [name] and she states patient is currently doing, "okay." Any questions or concerns? No  Items Reviewed: Did the pt receive and understand the discharge instructions provided? Yes  Medications obtained and verified? Yes  Other? No  Any new allergies since your discharge? No  Dietary orders reviewed? Yes Do you have support at home? Yes   Home Care and Equipment/Supplies: Were home health services ordered? no If so, what is the name of the agency? N.\/A  Has the agency set up a time to come to the patient's home? not applicable Were any new equipment or medical supplies ordered?  No What is the name of the medical supply agency? NA/ Were you able to get the supplies/equipment? not applicable Do you have any questions related to the use of the equipment or supplies? No  Functional Questionnaire: (I = Independent and D = Dependent) ADLs: I  Bathing/Dressing- I  Meal Prep- I  Eating- I  Maintaining continence- I  Transferring/Ambulation- I  Managing Meds- I  Follow up appointments reviewed:  PCP Hospital f/u appt confirmed? Yes  Scheduled to see Dr. Wolfgang Phoenix on 11/07/2021 @ 8 am. [facility] f/u appt confirmed? No  Will need to f/u with Dr. Theador Hawthorne in 1 month. Are transportation arrangements needed? No  If their condition worsens, is the pt aware to call PCP or go to the Emergency Dept.? Yes Was the patient provided with contact information for the PCP's office or ED? Yes Was to pt encouraged to call back with questions or concerns? Yes

## 2021-11-03 NOTE — Telephone Encounter (Signed)
Noted  

## 2021-11-07 ENCOUNTER — Telehealth: Payer: Self-pay

## 2021-11-07 ENCOUNTER — Encounter: Payer: Self-pay | Admitting: Family Medicine

## 2021-11-07 ENCOUNTER — Other Ambulatory Visit (HOSPITAL_COMMUNITY)
Admission: RE | Admit: 2021-11-07 | Discharge: 2021-11-07 | Disposition: A | Discharge status: Home / Self Care | Payer: Medicare HMO | Source: Ambulatory Visit | Attending: Family Medicine | Admitting: Family Medicine

## 2021-11-07 ENCOUNTER — Ambulatory Visit (INDEPENDENT_AMBULATORY_CARE_PROVIDER_SITE_OTHER): Payer: Medicare HMO | Admitting: Family Medicine

## 2021-11-07 VITALS — BP 126/70 | HR 83 | Temp 97.7°F | Wt 203.2 lb

## 2021-11-07 DIAGNOSIS — D500 Iron deficiency anemia secondary to blood loss (chronic): Secondary | ICD-10-CM | POA: Diagnosis not present

## 2021-11-07 DIAGNOSIS — R188 Other ascites: Secondary | ICD-10-CM

## 2021-11-07 DIAGNOSIS — N1832 Chronic kidney disease, stage 3b: Secondary | ICD-10-CM | POA: Diagnosis not present

## 2021-11-07 DIAGNOSIS — K7460 Unspecified cirrhosis of liver: Secondary | ICD-10-CM

## 2021-11-07 LAB — HEPATIC FUNCTION PANEL
ALT: 20 U/L (ref 0–44)
AST: 33 U/L (ref 15–41)
Albumin: 3.4 g/dL — ABNORMAL LOW (ref 3.5–5.0)
Alkaline Phosphatase: 90 U/L (ref 38–126)
Bilirubin, Direct: 0.1 mg/dL (ref 0.0–0.2)
Indirect Bilirubin: 0.6 mg/dL (ref 0.3–0.9)
Total Bilirubin: 0.7 mg/dL (ref 0.3–1.2)
Total Protein: 7.5 g/dL (ref 6.5–8.1)

## 2021-11-07 LAB — CBC WITH DIFFERENTIAL/PLATELET
Abs Immature Granulocytes: 0.01 10*3/uL (ref 0.00–0.07)
Basophils Absolute: 0 10*3/uL (ref 0.0–0.1)
Basophils Relative: 1 %
Eosinophils Absolute: 0.3 10*3/uL (ref 0.0–0.5)
Eosinophils Relative: 6 %
HCT: 27.7 % — ABNORMAL LOW (ref 39.0–52.0)
Hemoglobin: 8.8 g/dL — ABNORMAL LOW (ref 13.0–17.0)
Immature Granulocytes: 0 %
Lymphocytes Relative: 26 %
Lymphs Abs: 1.1 10*3/uL (ref 0.7–4.0)
MCH: 29.1 pg (ref 26.0–34.0)
MCHC: 31.8 g/dL (ref 30.0–36.0)
MCV: 91.7 fL (ref 80.0–100.0)
Monocytes Absolute: 0.4 10*3/uL (ref 0.1–1.0)
Monocytes Relative: 10 %
Neutro Abs: 2.2 10*3/uL (ref 1.7–7.7)
Neutrophils Relative %: 57 %
Platelets: 109 10*3/uL — ABNORMAL LOW (ref 150–400)
RBC: 3.02 MIL/uL — ABNORMAL LOW (ref 4.22–5.81)
RDW: 16.9 % — ABNORMAL HIGH (ref 11.5–15.5)
WBC: 4 10*3/uL (ref 4.0–10.5)
nRBC: 0 % (ref 0.0–0.2)

## 2021-11-07 LAB — BASIC METABOLIC PANEL
Anion gap: 6 (ref 5–15)
BUN: 24 mg/dL — ABNORMAL HIGH (ref 8–23)
CO2: 25 mmol/L (ref 22–32)
Calcium: 9.4 mg/dL (ref 8.9–10.3)
Chloride: 108 mmol/L (ref 98–111)
Creatinine, Ser: 1.87 mg/dL — ABNORMAL HIGH (ref 0.61–1.24)
GFR, Estimated: 39 mL/min — ABNORMAL LOW (ref >60)
Glucose, Bld: 69 mg/dL — ABNORMAL LOW (ref 70–99)
Potassium: 4.2 mmol/L (ref 3.5–5.1)
Sodium: 139 mmol/L (ref 135–145)

## 2021-11-07 LAB — GLUCOSE, CAPILLARY
Glucose-Capillary: 93 mg/dL (ref 70–99)
Glucose-Capillary: 125 mg/dL — ABNORMAL HIGH (ref 70–99)

## 2021-11-07 NOTE — Telephone Encounter (Addendum)
Called pt, informed him procedure for 11/09/21 is being cancelled. Endo scheduler informed.

## 2021-11-07 NOTE — Progress Notes (Signed)
   Subjective:    Patient ID: Austin Morgan, male    DOB: April 23, 1956, 66 y.o.   MRN: [id number]  HPI  [facility] for CHF. Patient was in the hospital Was treated for congestive heart failure Had fluid overload Kidney function had gone up and had came off of the diuretic then proceeded to have increased fluid retention, went into the hospital had diuresis, also had endoscopy per Dr. [name] Patient has known cirrhosis with esophageal varices and ascites Review of Systems     Objective:   Physical Exam General-in no acute distress Eyes-no discharge Lungs-respiratory rate normal, CTA CV-no murmurs,RRR Extremities skin warm dry moderate lower leg edema Neuro grossly normal Behavior normal, alert ascites  Hospital notes, tests, labs, EGD all reviewed.     Assessment & Plan:  1. Cirrhosis of liver with ascites, unspecified hepatic cirrhosis type ([place]) Continue current measures Follow-up with gastroenterology Message will be sent to gastroenterology to see if they can move the appointment regarding endoscopy does not seem like he needs another endoscopy currently - CBC with Differential - Basic Metabolic Panel (BMET) - Hepatic function panel  2. Stage 3b chronic kidney disease (CKD) ([place]) Was referred to nephrologist awaiting to hear from them - CBC with Differential - Basic Metabolic Panel (BMET) - Hepatic function panel  3. Iron deficiency anemia due to chronic blood loss Recheck CBC - CBC with Differential - Basic Metabolic Panel (BMET) - Hepatic [id number] panel  Communication was held with his GI doctor.  They did not feel that they needed to do follow-up EGD currently.  We have the patient coming back in 4 weeks for recheck

## 2021-11-07 NOTE — Telephone Encounter (Signed)
-----   Message from [name], MD sent at 11/07/2021  3:54 PM EDT ----- Regarding: RE: Procedure scheduled 11/09/2021. You are asking a very good question.  So, last month I found grade 3 varices with bleeding stigmata.  Bands applied.  In the interim, Dr. [name] performed a repeat EGD and found minimal grade 1 varix.  He was unable to touchup with an additional band.  I reviewed his Endo photos, the remaining varix was diminutive. He recommended a repeat EGD in 6 months.  Given that his varices were reduced significantly after the first banding session, we should wait and go with 6 months as the appropriate interval.  No need for an EGD on 6/1. ----- Message ----- From: [name], RN Sent: 11/07/2021   3:33 PM EDT To: [name], CMA, [name], MD, # Subject: Procedure scheduled 11/09/2021.                  Hello everyone! I am looking at Austin Morgan chart for pre-op tomorrow and procedure scheduled for 11/09/2021. He was in the hospital and had an EGD on 10/31/2021. D/C note states to repeat procedure in 6 months. Do we still need him to repeat EGD on 6/1?

## 2021-11-08 ENCOUNTER — Other Ambulatory Visit: Payer: Self-pay

## 2021-11-08 ENCOUNTER — Encounter (HOSPITAL_COMMUNITY): Admission: RE | Admit: 2021-11-08 | Payer: Medicare HMO | Source: Ambulatory Visit

## 2021-11-08 DIAGNOSIS — N1832 Chronic kidney disease, stage 3b: Secondary | ICD-10-CM

## 2021-11-08 DIAGNOSIS — D539 Nutritional anemia, unspecified: Secondary | ICD-10-CM

## 2021-11-08 DIAGNOSIS — D500 Iron deficiency anemia secondary to blood loss (chronic): Secondary | ICD-10-CM

## 2021-11-08 DIAGNOSIS — R188 Other ascites: Secondary | ICD-10-CM

## 2021-11-09 ENCOUNTER — Encounter (HOSPITAL_COMMUNITY): Admission: RE | Payer: Self-pay | Source: Ambulatory Visit

## 2021-11-09 ENCOUNTER — Ambulatory Visit (HOSPITAL_COMMUNITY): Admission: RE | Admit: 2021-11-09 | Payer: Medicare HMO | Source: Ambulatory Visit | Admitting: Internal Medicine

## 2021-11-09 ENCOUNTER — Other Ambulatory Visit: Payer: Self-pay | Admitting: *Deleted

## 2021-11-09 DIAGNOSIS — I714 Abdominal aortic aneurysm, without rupture, unspecified: Secondary | ICD-10-CM

## 2021-11-09 DIAGNOSIS — K7460 Unspecified cirrhosis of liver: Secondary | ICD-10-CM | POA: Diagnosis not present

## 2021-11-09 DIAGNOSIS — E1122 Type 2 diabetes mellitus with diabetic chronic kidney disease: Secondary | ICD-10-CM | POA: Diagnosis not present

## 2021-11-09 DIAGNOSIS — G4733 Obstructive sleep apnea (adult) (pediatric): Secondary | ICD-10-CM | POA: Diagnosis not present

## 2021-11-09 DIAGNOSIS — I5033 Acute on chronic diastolic (congestive) heart failure: Secondary | ICD-10-CM | POA: Diagnosis not present

## 2021-11-09 DIAGNOSIS — R809 Proteinuria, unspecified: Secondary | ICD-10-CM | POA: Diagnosis not present

## 2021-11-09 DIAGNOSIS — N170 Acute kidney failure with tubular necrosis: Secondary | ICD-10-CM | POA: Diagnosis not present

## 2021-11-09 DIAGNOSIS — E119 Type 2 diabetes mellitus without complications: Secondary | ICD-10-CM | POA: Insufficient documentation

## 2021-11-09 DIAGNOSIS — N189 Chronic kidney disease, unspecified: Secondary | ICD-10-CM | POA: Diagnosis not present

## 2021-11-09 DIAGNOSIS — E871 Hypo-osmolality and hyponatremia: Secondary | ICD-10-CM | POA: Diagnosis not present

## 2021-11-09 DIAGNOSIS — I10 Essential (primary) hypertension: Secondary | ICD-10-CM | POA: Insufficient documentation

## 2021-11-09 DIAGNOSIS — D638 Anemia in other chronic diseases classified elsewhere: Secondary | ICD-10-CM | POA: Diagnosis not present

## 2021-11-09 DIAGNOSIS — I129 Hypertensive chronic kidney disease with stage 1 through stage 4 chronic kidney disease, or unspecified chronic kidney disease: Secondary | ICD-10-CM | POA: Diagnosis not present

## 2021-11-09 DIAGNOSIS — E1129 Type 2 diabetes mellitus with other diabetic kidney complication: Secondary | ICD-10-CM | POA: Diagnosis not present

## 2021-11-09 SURGERY — ESOPHAGOGASTRODUODENOSCOPY (EGD) WITH PROPOFOL
Anesthesia: Monitor Anesthesia Care

## 2021-11-10 ENCOUNTER — Other Ambulatory Visit: Payer: Self-pay | Admitting: Nephrology

## 2021-11-10 ENCOUNTER — Other Ambulatory Visit (HOSPITAL_COMMUNITY): Payer: Self-pay | Admitting: Nephrology

## 2021-11-10 DIAGNOSIS — E1122 Type 2 diabetes mellitus with diabetic chronic kidney disease: Secondary | ICD-10-CM

## 2021-11-10 DIAGNOSIS — N170 Acute kidney failure with tubular necrosis: Secondary | ICD-10-CM

## 2021-11-14 DIAGNOSIS — Z0100 Encounter for examination of eyes and vision without abnormal findings: Secondary | ICD-10-CM | POA: Diagnosis not present

## 2021-11-14 DIAGNOSIS — H5210 Myopia, unspecified eye: Secondary | ICD-10-CM | POA: Diagnosis not present

## 2021-11-15 DIAGNOSIS — G4733 Obstructive sleep apnea (adult) (pediatric): Secondary | ICD-10-CM | POA: Diagnosis not present

## 2021-11-15 DIAGNOSIS — E1122 Type 2 diabetes mellitus with diabetic chronic kidney disease: Secondary | ICD-10-CM | POA: Diagnosis not present

## 2021-11-15 DIAGNOSIS — N170 Acute kidney failure with tubular necrosis: Secondary | ICD-10-CM | POA: Diagnosis not present

## 2021-11-15 DIAGNOSIS — I129 Hypertensive chronic kidney disease with stage 1 through stage 4 chronic kidney disease, or unspecified chronic kidney disease: Secondary | ICD-10-CM | POA: Diagnosis not present

## 2021-11-15 DIAGNOSIS — R809 Proteinuria, unspecified: Secondary | ICD-10-CM | POA: Diagnosis not present

## 2021-11-15 DIAGNOSIS — K7460 Unspecified cirrhosis of liver: Secondary | ICD-10-CM | POA: Diagnosis not present

## 2021-11-15 DIAGNOSIS — E1129 Type 2 diabetes mellitus with other diabetic kidney complication: Secondary | ICD-10-CM | POA: Diagnosis not present

## 2021-11-15 DIAGNOSIS — D638 Anemia in other chronic diseases classified elsewhere: Secondary | ICD-10-CM | POA: Diagnosis not present

## 2021-11-15 DIAGNOSIS — N189 Chronic kidney disease, unspecified: Secondary | ICD-10-CM | POA: Diagnosis not present

## 2021-11-15 DIAGNOSIS — E871 Hypo-osmolality and hyponatremia: Secondary | ICD-10-CM | POA: Diagnosis not present

## 2021-11-22 ENCOUNTER — Ambulatory Visit (HOSPITAL_COMMUNITY)
Admission: RE | Admit: 2021-11-22 | Discharge: 2021-11-22 | Disposition: A | Discharge status: Home / Self Care | Payer: Medicare HMO | Source: Ambulatory Visit | Attending: Family Medicine | Admitting: Family Medicine

## 2021-11-22 DIAGNOSIS — I714 Abdominal aortic aneurysm, without rupture, unspecified: Secondary | ICD-10-CM | POA: Insufficient documentation

## 2021-11-22 DIAGNOSIS — Z136 Encounter for screening for cardiovascular disorders: Secondary | ICD-10-CM | POA: Diagnosis not present

## 2021-11-22 DIAGNOSIS — E1122 Type 2 diabetes mellitus with diabetic chronic kidney disease: Secondary | ICD-10-CM | POA: Insufficient documentation

## 2021-11-22 DIAGNOSIS — N170 Acute kidney failure with tubular necrosis: Secondary | ICD-10-CM | POA: Diagnosis not present

## 2021-11-23 ENCOUNTER — Ambulatory Visit (HOSPITAL_COMMUNITY)
Admission: RE | Admit: 2021-11-23 | Discharge: 2021-11-23 | Disposition: A | Discharge status: Home / Self Care | Payer: Medicare HMO | Source: Ambulatory Visit | Attending: Nephrology | Admitting: Nephrology

## 2021-11-23 DIAGNOSIS — N289 Disorder of kidney and ureter, unspecified: Secondary | ICD-10-CM | POA: Diagnosis not present

## 2021-11-23 DIAGNOSIS — N170 Acute kidney failure with tubular necrosis: Secondary | ICD-10-CM | POA: Diagnosis not present

## 2021-11-23 DIAGNOSIS — E1122 Type 2 diabetes mellitus with diabetic chronic kidney disease: Secondary | ICD-10-CM | POA: Diagnosis not present

## 2021-11-23 DIAGNOSIS — I714 Abdominal aortic aneurysm, without rupture, unspecified: Secondary | ICD-10-CM | POA: Diagnosis not present

## 2021-11-27 ENCOUNTER — Other Ambulatory Visit (HOSPITAL_COMMUNITY): Payer: Self-pay | Admitting: Nephrology

## 2021-11-27 DIAGNOSIS — D6959 Other secondary thrombocytopenia: Secondary | ICD-10-CM | POA: Diagnosis not present

## 2021-11-27 DIAGNOSIS — N170 Acute kidney failure with tubular necrosis: Secondary | ICD-10-CM | POA: Diagnosis not present

## 2021-11-27 DIAGNOSIS — R809 Proteinuria, unspecified: Secondary | ICD-10-CM | POA: Diagnosis not present

## 2021-11-27 DIAGNOSIS — J918 Pleural effusion in other conditions classified elsewhere: Secondary | ICD-10-CM | POA: Diagnosis not present

## 2021-11-27 DIAGNOSIS — D72819 Decreased white blood cell count, unspecified: Secondary | ICD-10-CM | POA: Diagnosis not present

## 2021-11-27 DIAGNOSIS — N189 Chronic kidney disease, unspecified: Secondary | ICD-10-CM | POA: Diagnosis not present

## 2021-11-27 DIAGNOSIS — K7460 Unspecified cirrhosis of liver: Secondary | ICD-10-CM | POA: Diagnosis not present

## 2021-11-27 DIAGNOSIS — D841 Defects in the complement system: Secondary | ICD-10-CM | POA: Diagnosis not present

## 2021-11-27 DIAGNOSIS — R778 Other specified abnormalities of plasma proteins: Secondary | ICD-10-CM | POA: Diagnosis not present

## 2021-11-27 DIAGNOSIS — I129 Hypertensive chronic kidney disease with stage 1 through stage 4 chronic kidney disease, or unspecified chronic kidney disease: Secondary | ICD-10-CM | POA: Diagnosis not present

## 2021-11-27 DIAGNOSIS — E1122 Type 2 diabetes mellitus with diabetic chronic kidney disease: Secondary | ICD-10-CM | POA: Diagnosis not present

## 2021-11-27 DIAGNOSIS — D638 Anemia in other chronic diseases classified elsewhere: Secondary | ICD-10-CM | POA: Diagnosis not present

## 2021-11-28 ENCOUNTER — Encounter: Payer: Self-pay | Admitting: Internal Medicine

## 2021-11-28 ENCOUNTER — Ambulatory Visit: Payer: Medicare HMO | Admitting: Internal Medicine

## 2021-11-28 VITALS — BP 121/77 | HR 65 | Temp 97.7°F | Ht 67.0 in | Wt 213.0 lb

## 2021-11-28 DIAGNOSIS — R69 Illness, unspecified: Secondary | ICD-10-CM | POA: Diagnosis not present

## 2021-11-28 DIAGNOSIS — R609 Edema, unspecified: Secondary | ICD-10-CM

## 2021-11-28 DIAGNOSIS — K7030 Alcoholic cirrhosis of liver without ascites: Secondary | ICD-10-CM

## 2021-11-28 DIAGNOSIS — R748 Abnormal levels of other serum enzymes: Secondary | ICD-10-CM

## 2021-11-28 DIAGNOSIS — K7460 Unspecified cirrhosis of liver: Secondary | ICD-10-CM

## 2021-11-28 NOTE — Progress Notes (Unsigned)
  Primary Care Physician:  Luking, Scott A, MD Primary Gastroenterologist:  Dr.   Pre-Procedure History & Physical: HPI:  Austin Morgan is a 66 y.o. male here for   Past Medical History:  Diagnosis Date   Anxiety    Arthritis    Depression    Diabetes mellitus without complication (HCC)    ED (erectile dysfunction)    Gout    H/O ETOH abuse    Hyperlipidemia    Hypertension    Hypertriglyceridemia    PONV (postoperative nausea and vomiting)    Sleep apnea     Past Surgical History:  Procedure Laterality Date   CARPAL TUNNEL RELEASE Bilateral    COLONOSCOPY  2010   Dr. Dora Brodie: mild diverticulosis, next colonoscopy 2020   COLONOSCOPY WITH PROPOFOL N/A 01/21/2020   Rourk: 9 polyps removed, multiple tubular adenomas.  Next colonoscopy in 3 years.   ELBOW SURGERY     ESOPHAGEAL BANDING  09/28/2021   Procedure: ESOPHAGEAL BANDING;  Surgeon: Rourk, Elyan M, MD;  [facility]: [facility];  Service: Endoscopy;;   ESOPHAGOGASTRODUODENOSCOPY (EGD) WITH PROPOFOL N/A 01/21/2020   Rourk: normal   ESOPHAGOGASTRODUODENOSCOPY (EGD) WITH PROPOFOL N/A 09/28/2021   Procedure: ESOPHAGOGASTRODUODENOSCOPY (EGD) WITH PROPOFOL;  Surgeon: Rourk, Adell M, MD;  Location: [facility];  Service: Endoscopy;  Laterality: N/A;  3:00pm   ESOPHAGOGASTRODUODENOSCOPY (EGD) WITH PROPOFOL N/A 10/31/2021   Procedure: ESOPHAGOGASTRODUODENOSCOPY (EGD) WITH PROPOFOL;  Surgeon: Castaneda Mayorga, Daniel, MD;  Location: [facility];  Service: Gastroenterology;  Laterality: N/A;   NECK SURGERY     c4   POLYPECTOMY  01/21/2020   Procedure: POLYPECTOMY;  Surgeon: Rourk, Alee M, MD;  Location: [facility];  Service: Endoscopy;;  colon    Prior to Admission medications   Medication Sig Start Date End Date Taking? Authorizing Provider  albuterol (VENTOLIN HFA) 108 (90 Base) MCG/ACT inhaler Inhale 2 puffs into the lungs every 6 (six) hours as needed for wheezing. 10/24/21  Yes Luking, Scott A, MD  blood  glucose meter kit and supplies KIT Dispense based on patient and insurance preference. Use up to BID as directed. 03/20/19  Yes Hoskins, Carolyn C, NP  furosemide (LASIX) 20 MG tablet Take 1 tablet (20 mg total) by mouth daily. Patient taking differently: Take 40 mg by mouth 2 (two) times daily. 11/06/21  Yes Johnson, Clanford L, MD  insulin detemir (LEVEMIR FLEXTOUCH) 100 UNIT/ML FlexPen Inject 14 Units into the skin every evening. 11/02/21  Yes Johnson, Clanford L, MD  Multiple Vitamins-Minerals (MULTIVITAMIN WITH MINERALS) tablet Take 1 tablet by mouth daily.   Yes [provider]  nadolol (CORGARD) 40 MG tablet Take 1/2 (one half) tablet po daily 10/24/21  Yes Luking, Scott A, MD  naltrexone (DEPADE) 50 MG tablet TAKE ONE TABLET BY MOUTH ONCE DAILY. 01/02/21  Yes Luking, Scott A, MD  ONETOUCH ULTRA test strip USE AS DIRECTED TO TEST BLOOD GLUCOSE TWICE DAILY. 10/02/19  Yes Luking, Scott A, MD  pantoprazole (PROTONIX) 40 MG tablet Take 1 tablet (40 mg total) by mouth 2 (two) times daily. 09/25/21  Yes Luking, Scott A, MD  pramipexole (MIRAPEX) 0.25 MG tablet Take 2 tablets (0.5 mg total) by mouth at bedtime. TAKE 1 TABLET BY MOUTH NIGHTLY AT  LEAST  2  HOURS  BEFORE  BEDTIME  FOR  RESTLESS  LEGS 11/02/21  Yes Johnson, Clanford L, MD  traZODone (DESYREL) 100 MG tablet Take 1 tablet (100 mg total) by mouth at bedtime as needed.   for sleep 08/15/21  Yes Kathyrn Drown, MD    Allergies as of 11/28/2021 - Review Complete 11/28/2021  Allergen Reaction Noted   Codeine Nausea And Vomiting 11/26/2008    Family History  Problem Relation Age of Onset   Heart attack Father    Colon cancer Neg Hx     Social History   Socioeconomic History   Marital status: Married    Spouse name: Not on file   Number of children: Not on file   Years of education: Not on file   Highest education level: Not on file  Occupational History   Not on file  Tobacco Use   Smoking status: Former    Packs/day: 1.00     Years: 36.00    Total pack years: 36.00    Types: Cigarettes    Quit date: 03/11/2016    Years since quitting: 5.7    Passive exposure: Past   Smokeless tobacco: Never  Vaping Use   Vaping Use: Never used  Substance and Sexual Activity   Alcohol use: Not Currently    Comment: drinks a fifth of liquor per week; inpatient rehab 08/2019. denied 10/14/19. No etoh since 03/2021 (as of 09/22/21)   Drug use: No   Sexual activity: Yes  Other Topics Concern   Not on file  Social History Narrative   Wife, Taiden Raybourn NP.   Social Determinants of Health   Financial Resource Strain: Not on file  Food Insecurity: Not on file  Transportation Needs: Not on file  Physical Activity: Not on file  Stress: Not on file  Social Connections: Not on file  Intimate Partner Violence: Not on file    Review of Systems: See HPI, otherwise negative ROS  Physical Exam: BP 121/77 (BP Location: Right Arm, Patient Position: Sitting, Cuff Size: Normal)   Pulse 65   Temp 97.7 F (36.5 C) (Temporal)   Ht 5' 7" (1.702 m)   Wt 213 lb (96.6 kg)   SpO2 92%   BMI 33.36 kg/m  General:   Alert,  Well-developed, well-nourished, pleasant and cooperative in NAD Skin:  Intact without significant lesions or rashes. Eyes:  Sclera clear, no icterus.   Conjunctiva pink. Ears:  Normal auditory acuity. Nose:  No deformity, discharge,  or lesions. Mouth:  No deformity or lesions. Neck:  Supple; no masses or thyromegaly. No significant cervical adenopathy. Lungs:  Clear throughout to auscultation.   No wheezes, crackles, or rhonchi. No acute distress. Heart:  Regular rate and rhythm; no murmurs, clicks, rubs,  or gallops. Abdomen: Non-distended, normal bowel sounds.  Soft and nontender without appreciable mass or hepatosplenomegaly.  Pulses:  Normal pulses noted. Extremities:  Without clubbing or edema.  Impression/Plan:  ***     Notice: This dictation was prepared with Dragon dictation along with smaller phrase  technology. Any transcriptional errors that result from this process are unintentional and may not be corrected upon review.

## 2021-11-28 NOTE — Patient Instructions (Addendum)
It was good to see you again today!  Informational 2 g sodium diet provided  Recommend repeat EGD in about 5 months  Chem-12 and INR in the near future (will add on to Dr. Toya Smothers labs to be drawn)  Continue nadolol one half a tablet (20 mg) daily  Plan for repeat colonoscopy 2024.  Continue Protonix daily  Office visit here in 3 months

## 2021-11-29 ENCOUNTER — Ambulatory Visit (HOSPITAL_COMMUNITY)
Admission: RE | Admit: 2021-11-29 | Discharge: 2021-11-29 | Disposition: A | Discharge status: Home / Self Care | Payer: Medicare HMO | Source: Ambulatory Visit | Attending: Nephrology | Admitting: Nephrology

## 2021-11-29 DIAGNOSIS — N170 Acute kidney failure with tubular necrosis: Secondary | ICD-10-CM | POA: Diagnosis not present

## 2021-11-29 DIAGNOSIS — N19 Unspecified kidney failure: Secondary | ICD-10-CM | POA: Diagnosis not present

## 2021-12-05 ENCOUNTER — Ambulatory Visit: Payer: Self-pay | Admitting: Family Medicine

## 2021-12-07 DIAGNOSIS — K7460 Unspecified cirrhosis of liver: Secondary | ICD-10-CM | POA: Diagnosis not present

## 2021-12-07 DIAGNOSIS — K7682 Hepatic encephalopathy: Secondary | ICD-10-CM | POA: Diagnosis not present

## 2021-12-07 DIAGNOSIS — R040 Epistaxis: Secondary | ICD-10-CM | POA: Diagnosis not present

## 2021-12-07 DIAGNOSIS — R410 Disorientation, unspecified: Secondary | ICD-10-CM | POA: Diagnosis not present

## 2021-12-07 DIAGNOSIS — E7220 Disorder of urea cycle metabolism, unspecified: Secondary | ICD-10-CM | POA: Diagnosis not present

## 2021-12-07 DIAGNOSIS — N183 Chronic kidney disease, stage 3 unspecified: Secondary | ICD-10-CM | POA: Diagnosis not present

## 2021-12-07 DIAGNOSIS — E1122 Type 2 diabetes mellitus with diabetic chronic kidney disease: Secondary | ICD-10-CM | POA: Diagnosis not present

## 2021-12-07 DIAGNOSIS — R4182 Altered mental status, unspecified: Secondary | ICD-10-CM | POA: Diagnosis not present

## 2021-12-07 DIAGNOSIS — R9082 White matter disease, unspecified: Secondary | ICD-10-CM | POA: Diagnosis not present

## 2021-12-07 DIAGNOSIS — E785 Hyperlipidemia, unspecified: Secondary | ICD-10-CM | POA: Diagnosis not present

## 2021-12-07 DIAGNOSIS — Z8679 Personal history of other diseases of the circulatory system: Secondary | ICD-10-CM | POA: Diagnosis not present

## 2021-12-08 ENCOUNTER — Encounter: Payer: Self-pay | Admitting: Family Medicine

## 2021-12-08 DIAGNOSIS — K7460 Unspecified cirrhosis of liver: Secondary | ICD-10-CM

## 2021-12-08 DIAGNOSIS — R4182 Altered mental status, unspecified: Secondary | ICD-10-CM

## 2021-12-08 DIAGNOSIS — N1832 Chronic kidney disease, stage 3b: Secondary | ICD-10-CM

## 2021-12-11 NOTE — Telephone Encounter (Signed)
Nurses Certainly we are concerned Touch base with [name] If his kidney doctor did a metabolic 7 he will not need a metabolic 7 but for [place] to act on it we certainly need the results-hopefully those will come in soon or we could at least have a copy forwarded to [place]  As for his ammonia level I would recommend repeating that along with the liver function and CBC Due to-cirrhosis, mental status change, chronic kidney disease  It would be fine for him to follow-up with his GI doctor but sometimes that can be problematic to get in quickly I would encourage her to call to set up a follow-up I can see him this week in a open slot please set up-as long as they are on board with that  As for the lactulose-please have [name] let us know how much lactulose is utilizing per day?  We can send in a prescription for 30 days with 5 refills  Long-term management of the lactulose would be best to be done by gastroenterology for the this particular purpose  Let me know how we can be of more help thanks

## 2021-12-13 ENCOUNTER — Encounter: Payer: Self-pay | Admitting: Internal Medicine

## 2021-12-13 ENCOUNTER — Telehealth: Payer: Self-pay | Admitting: *Deleted

## 2021-12-13 ENCOUNTER — Other Ambulatory Visit (HOSPITAL_COMMUNITY)
Admission: RE | Admit: 2021-12-13 | Discharge: 2021-12-13 | Disposition: A | Discharge status: Home / Self Care | Payer: Medicare HMO | Source: Ambulatory Visit | Attending: Family Medicine | Admitting: Family Medicine

## 2021-12-13 ENCOUNTER — Encounter: Payer: Self-pay | Admitting: Family Medicine

## 2021-12-13 ENCOUNTER — Telehealth: Payer: Self-pay | Admitting: Gastroenterology

## 2021-12-13 ENCOUNTER — Ambulatory Visit (INDEPENDENT_AMBULATORY_CARE_PROVIDER_SITE_OTHER): Payer: Medicare HMO | Admitting: Family Medicine

## 2021-12-13 VITALS — BP 118/62 | Wt 199.0 lb

## 2021-12-13 DIAGNOSIS — N1832 Chronic kidney disease, stage 3b: Secondary | ICD-10-CM | POA: Diagnosis not present

## 2021-12-13 DIAGNOSIS — K7460 Unspecified cirrhosis of liver: Secondary | ICD-10-CM

## 2021-12-13 DIAGNOSIS — R040 Epistaxis: Secondary | ICD-10-CM | POA: Insufficient documentation

## 2021-12-13 DIAGNOSIS — R609 Edema, unspecified: Secondary | ICD-10-CM | POA: Insufficient documentation

## 2021-12-13 DIAGNOSIS — R188 Other ascites: Secondary | ICD-10-CM

## 2021-12-13 LAB — CBC WITH DIFFERENTIAL/PLATELET
Abs Immature Granulocytes: 0 10*3/uL (ref 0.00–0.07)
Basophils Absolute: 0 10*3/uL (ref 0.0–0.1)
Basophils Relative: 1 %
Eosinophils Absolute: 0.2 10*3/uL (ref 0.0–0.5)
Eosinophils Relative: 6 %
HCT: 26.3 % — ABNORMAL LOW (ref 39.0–52.0)
Hemoglobin: 8.6 g/dL — ABNORMAL LOW (ref 13.0–17.0)
Immature Granulocytes: 0 %
Lymphocytes Relative: 24 %
Lymphs Abs: 0.8 10*3/uL (ref 0.7–4.0)
MCH: 29.9 pg (ref 26.0–34.0)
MCHC: 32.7 g/dL (ref 30.0–36.0)
MCV: 91.3 fL (ref 80.0–100.0)
Monocytes Absolute: 0.3 10*3/uL (ref 0.1–1.0)
Monocytes Relative: 10 %
Neutro Abs: 1.9 10*3/uL (ref 1.7–7.7)
Neutrophils Relative %: 59 %
Platelets: 93 10*3/uL — ABNORMAL LOW (ref 150–400)
RBC: 2.88 MIL/uL — ABNORMAL LOW (ref 4.22–5.81)
RDW: 17.5 % — ABNORMAL HIGH (ref 11.5–15.5)
WBC: 3.2 10*3/uL — ABNORMAL LOW (ref 4.0–10.5)
nRBC: 0 % (ref 0.0–0.2)

## 2021-12-13 LAB — BASIC METABOLIC PANEL
Anion gap: 7 (ref 5–15)
BUN: 37 mg/dL — ABNORMAL HIGH (ref 8–23)
CO2: 26 mmol/L (ref 22–32)
Calcium: 9.2 mg/dL (ref 8.9–10.3)
Chloride: 106 mmol/L (ref 98–111)
Creatinine, Ser: 2.55 mg/dL — ABNORMAL HIGH (ref 0.61–1.24)
GFR, Estimated: 27 mL/min — ABNORMAL LOW (ref >60)
Glucose, Bld: 175 mg/dL — ABNORMAL HIGH (ref 70–99)
Potassium: 3.9 mmol/L (ref 3.5–5.1)
Sodium: 139 mmol/L (ref 135–145)

## 2021-12-13 LAB — HEPATIC FUNCTION PANEL
ALT: 18 U/L (ref 0–44)
AST: 28 U/L (ref 15–41)
Albumin: 3.4 g/dL — ABNORMAL LOW (ref 3.5–5.0)
Alkaline Phosphatase: 98 U/L (ref 38–126)
Bilirubin, Direct: 0.2 mg/dL (ref 0.0–0.2)
Indirect Bilirubin: 0.8 mg/dL (ref 0.3–0.9)
Total Bilirubin: 1 mg/dL (ref 0.3–1.2)
Total Protein: 7.6 g/dL (ref 6.5–8.1)

## 2021-12-13 LAB — PROTIME-INR
INR: 1.4 — ABNORMAL HIGH (ref 0.8–1.2)
Prothrombin Time: 16.7 seconds — ABNORMAL HIGH (ref 11.4–15.2)

## 2021-12-13 LAB — PHOSPHORUS: Phosphorus: 3.8 mg/dL (ref 2.5–4.6)

## 2021-12-13 LAB — LIPASE, BLOOD: Lipase: 25 U/L (ref 11–51)

## 2021-12-13 LAB — AMMONIA: Ammonia: 133 umol/L — ABNORMAL HIGH (ref 9–35)

## 2021-12-13 MED ORDER — LACTULOSE 10 GM/15ML PO SOLN: ORAL | 6 refills | Status: DC

## 2021-12-13 NOTE — Telephone Encounter (Signed)
Patient scheduled with Dr Nicki Reaper today at 11:30am for follow up

## 2021-12-13 NOTE — Telephone Encounter (Signed)
Please let me speak with [name] this afternoon thank you-she may call my cell number [phone]

## 2021-12-13 NOTE — Telephone Encounter (Signed)
Patient would like to get a handicap placard. He said you could mail it or he could come by and pick it up. Please advise. Thank you

## 2021-12-13 NOTE — Telephone Encounter (Signed)
Placecard filled out and placed on provider door for signature. Please advise. Thank you

## 2021-12-13 NOTE — Telephone Encounter (Signed)
Please go ahead and fill it in and then please send it to me for me to fill out my portion thank you

## 2021-12-13 NOTE — Telephone Encounter (Signed)
Spoke with Dr. Wolfgang Phoenix regarding patient. Recent episode of hepatic encephalopathy while out of town.  Seen emergently while out of town, given lactulose for 3 days.  Took for 2-1/2 days, noted some improvement in mental status but declined again today.  Seen by Dr. Wolfgang Phoenix in the office today.  Ammonia level elevated at 133.  Patient alert, with some confusion and tremors.  CBC, LFTs, INR all stable.  Creatinine 2.55, according to spouse was 2.3 recently with nephrology, diuretics recently increased.  Discussed, hepatic encephalopathy may have been brought on by dehydration given no overt signs of infection or bleeding.    Would recommend increasing lactulose to 2 tablespoons 2-3 times daily, starting today to have 2-3 soft bowel movements daily.  If confusion does not improve, wife should let us know.  If he is not able to take lactulose orally for any reason, he would need to go to the ED for management.Goal is 2-3 soft stools daily, patient may need to titrate lactulose up or down accordingly. Dr. Wolfgang Phoenix to discuss recommendations with spouse.  We need to get patient back into the office Monday or Tuesday of next week.  Please make arrangements.

## 2021-12-13 NOTE — Telephone Encounter (Signed)
Nurses I would recommend that we see him 1130 here (I can work him in before lunch) As for the labs he will go from here to the lab at the hospital to get stat labs. Also bring the lactulose with him so I can see the directions that he is taking currently  It is hard to know whether or not he will need to be back in the hospital without seeing him.  Also we can try to get gastroenterology to do follow-up with him relatively soon but uncertain of their availability

## 2021-12-13 NOTE — Telephone Encounter (Signed)
My chart message sent to patient to let him know the place card is ready.

## 2021-12-13 NOTE — Progress Notes (Signed)
   Subjective:    Patient ID: Austin Morgan, male    DOB: 1955/08/22, 66 y.o.   MRN: [id number]  HPI Pt here for confusion,sleepiness, and worsening tremors not related to being cold. Sugar was 167 this morning. Pt was on vacation last week and had ER visit. Sugar was fine last Thursday. Lactulose was given for 3 days by ER. Pt was improving before Lactulose. Continued swelling in ankles. Wife has not administered any diuretics this morning. Pt fell this morning-fell on bottom. CT of head done at ER.   Patient has complicated history Has underlying cirrhosis Also has kidney stage III Also has diabetes Overview of weekend had a spell where he went to the ER with hepatic encephalopathy.  Was treated with lactulose.  Patient also has nosebleed which intermittently occurred has history of esophageal varices Review of Systems     Objective:   Physical Exam  General-in no acute distress Eyes-no discharge Lungs-respiratory rate normal, CTA CV-no murmurs,RRR Extremities skin warm dry moderately swollen lower legs with pitting edema edema Neuro grossly normal Behavior normal, alert  The patient and his wife states that he is stayed away from alcohol for 1 year     Assessment & Plan:  1. Cirrhosis of liver with ascites, unspecified hepatic cirrhosis type ([place]) Unfortunately having some significant setbacks It does appear that he had hepatic encephalopathy while on vacation.  He is also having a little bit of problems focusing and staying attentive currently Although he is aware of where he is surrounding date and time - CBC with Differential - Basic Metabolic Panel - Hepatic function panel - Ammonia - Phosphorus - Lipase - Protime-INR  2. Stage 3b chronic kidney disease (CKD) (HCC) Check kidney function can continue diuretic follow through with specialist - CBC with Differential - Basic Metabolic Panel - Hepatic function panel - Ammonia - Phosphorus - Lipase -  Protime-INR  3. Edema, unspecified type Continue diuretic check lab work await results - CBC with Differential - Basic Metabolic Panel - Hepatic function panel - Ammonia - Phosphorus - Lipase - Protime-INR  4. Epistaxis Concerning with his underlying cirrhosis no bleeding currently - CBC with Differential - Basic Metabolic Panel - Hepatic function panel - Ammonia - Phosphorus - Lipase - Protime-INR  Initiate lactulose 15 mL twice daily to keep soft bowel movements frequently  Post note-I did discuss the case with Neil Crouch, PA with gastroenterology.  It is recommended to bump up the dose of lactulose to get 3-4 soft bowel movements per day we will increase the dose to 2 tablespoons 2-3 times per day until frequent bowel movements then titrate appropriately.  This was discussed with his wife as well.  Gastroenterology aware of labs.  Patient can wife aware of labs.  We will go ahead and see him nephrology in the near future patient will follow-up with [place] in several weeks

## 2021-12-14 DIAGNOSIS — I129 Hypertensive chronic kidney disease with stage 1 through stage 4 chronic kidney disease, or unspecified chronic kidney disease: Secondary | ICD-10-CM | POA: Diagnosis not present

## 2021-12-14 DIAGNOSIS — E7220 Disorder of urea cycle metabolism, unspecified: Secondary | ICD-10-CM | POA: Diagnosis not present

## 2021-12-14 DIAGNOSIS — E1122 Type 2 diabetes mellitus with diabetic chronic kidney disease: Secondary | ICD-10-CM | POA: Diagnosis not present

## 2021-12-14 DIAGNOSIS — K7460 Unspecified cirrhosis of liver: Secondary | ICD-10-CM | POA: Diagnosis not present

## 2021-12-14 DIAGNOSIS — E1129 Type 2 diabetes mellitus with other diabetic kidney complication: Secondary | ICD-10-CM | POA: Diagnosis not present

## 2021-12-14 DIAGNOSIS — R809 Proteinuria, unspecified: Secondary | ICD-10-CM | POA: Diagnosis not present

## 2021-12-14 DIAGNOSIS — D6959 Other secondary thrombocytopenia: Secondary | ICD-10-CM | POA: Diagnosis not present

## 2021-12-14 DIAGNOSIS — N189 Chronic kidney disease, unspecified: Secondary | ICD-10-CM | POA: Diagnosis not present

## 2021-12-14 DIAGNOSIS — D638 Anemia in other chronic diseases classified elsewhere: Secondary | ICD-10-CM | POA: Diagnosis not present

## 2021-12-14 DIAGNOSIS — N170 Acute kidney failure with tubular necrosis: Secondary | ICD-10-CM | POA: Diagnosis not present

## 2021-12-14 NOTE — Progress Notes (Signed)
12/14/21-lab results sent to [place] office

## 2021-12-14 NOTE — Telephone Encounter (Signed)
PCP spoke with Neil Crouch

## 2021-12-15 ENCOUNTER — Other Ambulatory Visit: Payer: Self-pay | Admitting: Family Medicine

## 2021-12-18 ENCOUNTER — Ambulatory Visit: Payer: Medicare HMO | Admitting: Family Medicine

## 2021-12-18 ENCOUNTER — Ambulatory Visit (INDEPENDENT_AMBULATORY_CARE_PROVIDER_SITE_OTHER): Payer: Medicare HMO | Admitting: Gastroenterology

## 2021-12-18 ENCOUNTER — Encounter: Payer: Self-pay | Admitting: Gastroenterology

## 2021-12-18 VITALS — BP 115/70 | HR 64 | Temp 97.1°F | Ht 66.0 in | Wt 199.8 lb

## 2021-12-18 DIAGNOSIS — R69 Illness, unspecified: Secondary | ICD-10-CM | POA: Diagnosis not present

## 2021-12-18 DIAGNOSIS — R601 Generalized edema: Secondary | ICD-10-CM | POA: Diagnosis not present

## 2021-12-18 DIAGNOSIS — K7682 Hepatic encephalopathy: Secondary | ICD-10-CM | POA: Diagnosis not present

## 2021-12-18 DIAGNOSIS — K7030 Alcoholic cirrhosis of liver without ascites: Secondary | ICD-10-CM

## 2021-12-18 NOTE — Patient Instructions (Addendum)
We will have you update a couple of liver labs when you go to [facility] for Dr. Theador Hawthorne. Take our orders when you go.  Continue lactulose to try and have 3-4 soft mushy stools daily. You can adjust dosage up or down as needed to maintain this goal. If you are having issues, please let us know and we can help provide additional instructions.  There is also another medication we could potentially add if you continue to have episodes of confusion or tremors.  Just keep [place] posted. I would recommend you consider initial evaluation with liver specialist/transplant team at one of the medical centers, [place], [place], [facility].  [facility] does have a satellite clinic in [place] that you could go for initial appointments.  Because you have had a couple of setbacks regarding her liver, I think we should move forward so that you can discuss any potential options or needs i.e. evaluation for liver transplant in the future.  Even if you are unsure whether you want to pursue this option, it would be good to go ahead and be seen so that you can get more information to be able to make an informed decision. Continue nadolol 20 mg daily. Continue pantoprazole 40 mg twice daily. Continue your diuretics per Dr. Theador Hawthorne. We will be setting you up for 47-monthfollow-up with Dr. RGala Romney once the schedule becomes available.  You will be due for another upper endoscopy after that visit.

## 2021-12-18 NOTE — Progress Notes (Signed)
GI Office Note    Referring Provider: Kathyrn Drown, MD Primary Care Physician:  Austin Drown, MD  Primary Gastroenterologist:  Chief Complaint   Chief Complaint  Patient presents with   Follow-up    States that he is doing better, increased lactulose due to high ammonia level.    History of Present Illness   Austin Morgan is a 66 y.o. male presenting today for urgent follow-up for findings concerning for hepatic encephalopathy.  He was last seen on June 20 for follow-up of his alcohol-related cirrhosis.  He has a history of decompensated disease, with GI bleeding secondary to varices requiring esophageal variceal banding back in April.  Second hospitalization in May with volume overload and symptomatic anemia requiring repeat EGD which showed portal gastropathy and grade 1 varices.  He is due for colonoscopy next year for colonic adenomas.  EGD recommended in February.  Nadolol 20 mg daily advised.  Patient presents today by himself.  Family history obtained from Dr. Wolfgang Morgan and MyChart messages sent by his wife Austin Morgan.  They were on vacation, on June 29 patient woke up confused and belligerent.  EMS activated and took patient to hospital in [place]. He ammonia level was elevated. Wife felt like he was dehydrated with recent changes in diuretic therapy (Lasix increased to 40 mg twice daily and addition of spironolactone 25 mg daily). After receiving 500 mL of fluids he started coming around.  He was also put on lactulose 15 cc twice daily which he continued through the weekend but ran out first last week.  He was only given a few days worth by the outside hospital.  His trazodone was also decreased to 50 mg at bedtime.  He saw PCP as well as nephrology last week.  Labs from December 13, 2021: Ammonia 133, white blood cell count 3200, hemoglobin 8.6, hematocrit 26.3, platelets 93,000, lipase 25, INR 1.4, total bilirubin 1, alk phos 98, AST 28, ALT 18, albumin 3.4, BUN 37, creatinine  2.55 up from 2.08 one month prior.  Today: patient states he was unaware of much of what transpired 12/07/21. He is slowly returning to baseline. No more confusion. Feels better each day. Taking lactulose 71m four times daily and having about 2-3 soft stools. Had better stools today but also feels like this is due to better oral intake. No abdominal pain. No melena, brbpr. No heartburn, vomiting, dysphagia. Continues to have lower extremity edema but today is better than last week.   Follows with nephrology for chronic kidney disease, stage 3b. Referred recently to hematology, for anemia of chronic disease/IDA, appointment July 12. Epogen advised per patient. With increased diuretics patient has dropped 14 pounds.   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 He has not worked since on vacation. Patient states Dr. BTheador Hawthornewants him to go back to work.     Medications   Current Outpatient Medications  Medication Sig Dispense Refill   albuterol (VENTOLIN HFA) 108 (90 Base) MCG/ACT inhaler Inhale 2 puffs into the lungs every 6 (six) hours as needed for wheezing. 1 each 2   blood glucose meter kit and supplies KIT Dispense based on patient and insurance preference. Use up to BID as directed. 1 each 0   furosemide (LASIX) 20 MG tablet Take 1 tablet (20 mg total) by mouth daily. (Patient taking differently: Take 40 mg by mouth 2 (two) times daily.) 30 tablet 1   insulin detemir (LEVEMIR FLEXTOUCH) 100 UNIT/ML  FlexPen Inject 14 Units into the skin every evening.     lactulose (CHRONULAC) 10 GM/15ML solution 15 mL twice daily (Patient taking differently: Take 30 mLs by mouth in the morning, at noon, in the evening, and at bedtime. 30 ml 4 times daily) 946 mL 6   Multiple Vitamins-Minerals (MULTIVITAMIN WITH MINERALS) tablet Take 1 tablet by mouth daily.     nadolol (CORGARD) 40 MG tablet Take 1/2 (one half) tablet po daily 45 tablet 1   naltrexone (DEPADE) 50 MG tablet TAKE ONE TABLET  BY MOUTH ONCE DAILY. 90 tablet 1   ondansetron (ZOFRAN) 8 MG tablet TAKE 1 TABLET BY MOUTH THREE TIMES DAILY AS NEEDED FOR NAUSEA 20 tablet 3   ONETOUCH ULTRA test strip USE AS DIRECTED TO TEST BLOOD GLUCOSE TWICE DAILY. 50 each 1   pantoprazole (PROTONIX) 40 MG tablet Take 1 tablet (40 mg total) by mouth 2 (two) times daily. 60 tablet 3   pramipexole (MIRAPEX) 0.25 MG tablet Take 2 tablets (0.5 mg total) by mouth at bedtime. TAKE 1 TABLET BY MOUTH NIGHTLY AT  LEAST  2  HOURS  BEFORE  BEDTIME  FOR  RESTLESS  LEGS (Patient taking differently: Take 0.25 mg by mouth at bedtime. TAKE 1 TABLET BY MOUTH NIGHTLY AT  LEAST  2  HOURS  BEFORE  BEDTIME  FOR  RESTLESS  LEGS)     traZODone (DESYREL) 100 MG tablet Take 1 tablet (100 mg total) by mouth at bedtime as needed. for sleep (Patient taking differently: Take 50 mg by mouth at bedtime as needed. for sleep) 90 tablet 3   spironolactone (ALDACTONE) 25 MG tablet Take 25 mg by mouth daily.     No current facility-administered medications for this visit.    Allergies   Allergies as of 12/18/2021 - Review Complete 12/18/2021  Allergen Reaction Noted   Codeine Nausea And Vomiting 11/26/2008     Review of Systems   General: Negative for anorexia, weight loss, fever, chills, fatigue, +weakness. ENT: Negative for hoarseness, difficulty swallowing, nasal congestion. CV: Negative for chest pain, angina, palpitations, dyspnea on exertion, ++peripheral edema.  Respiratory: Negative for dyspnea at rest, dyspnea on exertion, cough, sputum, wheezing.  GI: See history of present illness. GU:  Negative for dysuria, hematuria, urinary incontinence, urinary frequency, nocturnal urination.  Endo: Negative for unusual weight change.     Physical Exam   BP 115/70 (BP Location: Left Arm, Patient Position: Sitting, Cuff Size: Normal)   Pulse 64   Temp (!) 97.1 F (36.2 C) (Temporal)   Ht 5' 6"  (1.676 m)   Wt 199 lb 12.8 oz (90.6 kg)   SpO2 99%   BMI 32.25 kg/m     General: Well-nourished, well-developed in no acute distress.  Eyes: No icterus. Mouth: Oropharyngeal mucosa moist and pink , no lesions erythema or exudate. Lungs: Clear to auscultation bilaterally.  Heart: Regular rate and rhythm, no murmurs rubs or gallops.  Abdomen: Bowel sounds are normal, nontender, nondistended, no hepatosplenomegaly or masses,  no abdominal bruits or hernia , no rebound or guarding.  Rectal: not performed Extremities: 2-3+ pitting edema to the knees bilaterally.No clubbing or deformities. Neuro: Alert and oriented x 4. No asterixis. Skin: Warm and dry, no jaundice.   Psych: Alert and cooperative, normal mood and affect.  Labs   Lab Results  Component Value Date   WBC 3.2 (L) 12/13/2021   HGB 8.6 (L) 12/13/2021   HCT 26.3 (L) 12/13/2021   MCV 91.3 12/13/2021  PLT 93 (L) 12/13/2021   Lab Results  Component Value Date   INR 1.4 (H) 12/13/2021   INR 1.3 (H) 11/02/2021   INR 1.4 (H) 11/01/2021   Lab Results  Component Value Date   ALT 18 12/13/2021   AST 28 12/13/2021   GGT 570 (H) 06/01/2020   ALKPHOS 98 12/13/2021   BILITOT 1.0 12/13/2021   Lab Results  Component Value Date   LIPASE 25 12/13/2021   Lab Results  Component Value Date   CREATININE 2.55 (H) 12/13/2021   BUN 37 (H) 12/13/2021   NA 139 12/13/2021   K 3.9 12/13/2021   CL 106 12/13/2021   CO2 26 12/13/2021     Ammonia 133 12/13/21  Imaging Studies   DG Chest 2 View  Result Date: 11/29/2021 CLINICAL DATA:  Acute renal failure. EXAM: CHEST - 2 VIEW COMPARISON:  Chest x-ray 10/30/2021. FINDINGS: Mediastinum and hilar structures normal. Cardiomegaly again noted. Mild pulmonary venous congestion cannot be completely excluded. Mild bilateral interstitial prominence again noted. A component of these changes may be chronic. An active interstitial process including interstitial edema and or pneumonitis cannot be completely excluded. No pleural effusion noted on today's exam. Mild  biapical pleural thickening again noted consistent with scarring. Prior cervical spine fusion. Degenerative changes and scoliosis thoracic spine. IMPRESSION: Cardiomegaly again noted. Mild pulmonary venous congestion cannot be completely excluded. Mild bilateral interstitial prominence again noted. A component these changes may be chronic. An active interstitial process including interstitial edema and or pneumonitis cannot be completely excluded. Interval resolution of previously identified pleural effusions. Electronically Signed   By: Marcello Moores  Register M.D.   On: 11/29/2021 11:35   US RENAL  Result Date: 11/23/2021 CLINICAL DATA:  Acute renal insufficiency EXAM: RENAL / URINARY TRACT ULTRASOUND COMPLETE COMPARISON:  None Available. FINDINGS: Right Kidney: Renal measurements: 11.6 x 5.7 x 6.6 cm = volume: 228 mL. Echogenicity within normal limits. No mass or hydronephrosis visualized. Left Kidney: Renal measurements: 10.5 x 5.4 x 5.0 cm = volume: 148 mL. Echogenicity within normal limits. No mass or hydronephrosis visualized. Bladder: Appears normal for degree of bladder distention. Other: Right pleural effusion.  Trace perihepatic ascites. IMPRESSION: 1. The kidneys and bladder are unremarkable. 2. Right pleural effusion. 3. Trace perihepatic ascites. Electronically Signed   By: Dorise Bullion III M.D.   On: 11/23/2021 18:40   US AORTA  Result Date: 11/23/2021 CLINICAL DATA:  Follow-up for abdominal aortic aneurysm. EXAM: ULTRASOUND OF ABDOMINAL AORTA TECHNIQUE: Ultrasound examination of the abdominal aorta and proximal common iliac arteries was performed to evaluate for aneurysm. Additional color and Doppler images of the distal aorta were obtained to document patency. COMPARISON:  Ultrasound of abdomen December 07, 2020 FINDINGS: Abdominal aortic measurements as follows: Proximal:  2.6 AP/1.9 transverse cm Mid:  1.5 AP/1.5 transverse cm Distal:  1.9 AP/1.9 transverse cm Patent: Yes, peak systolic velocity  is 837 cm/s Right common iliac artery: 1.1 AP/1 transverse cm Left common iliac artery: 0.8 AP/0.9 transverse cm IMPRESSION: No evidence of abdominal aortic aneurysm. No follow-up is recommended. This recommendation follows ACR consensus guidelines: White Paper of the ACR Incidental Findings Committee II on Vascular Findings. J Am Coll Radiol 2013; 10:789-794. Electronically Signed   By: Abelardo Diesel M.D.   On: 11/23/2021 09:22    Assessment   Cirrhosis: etoh related, sober since 03/2021. Has had several set backs in the past few months. Decompensated disease as noted by variceal bleeding, anasarca and now suspected hepatic encephalopathy. He has  also had noted decline in renal function and being follow closely by nephrology who is also managing his diuretics.   With regards to recent hepatic encephalopathy (HE), likely exacerbated by dehydration. No signs of infection or GI bleeding. He responded quickly with fluids and now on lactulose. Discussed goal of 3-4 soft stools daily. Need to avoid too frequent or too loose of stools as this could drive dehydration and recurrent HE.   During 10/2021 hospitalization, he voiced that he is not interested in liver transplant but it would be helpful for him to at this be seen in consultation so he can know what his options are moving forward which would help him in making informed decisions.   We also discussed possibility of recurrent HE. It is too soon to tell if his symptoms will be well controlled with lactulose. He may require addition of Xifaxan based on clinical course. I cautioned him regarding driving due to his episode of confusion.    PLAN   Continue lactulose 8m up to four times daily to maintain 3 soft/mushy stools daily. Titrate dose up or down according to needs.  He will update labs for MELD and AFP when he goes for future labs.  Continue nadolol 20 mg daily. Continue pantoprazole 40 mg twice daily. Management of diuretics per  nephrology. Consider liver transplant center consultation. He will discuss with his wife, RSuanne Morgan and let me know what he decides.  Return to see Dr. RGala Romneyin 3 months.  He will be due for repeat upper endoscopy at that time.     LLaureen Ochs LBobby Rumpf MMoenkopi [facility]ict[facility]

## 2021-12-19 NOTE — Progress Notes (Unsigned)
[facility] [address], [place] [address]   CLINIC:  Medical Oncology/Hematology  CONSULT NOTE  Patient Care Team: Kathyrn Drown, MD as PCP - General (Family Medicine)  CHIEF COMPLAINTS/PURPOSE OF CONSULTATION:  Evaluation of pancytopenia   HISTORY OF PRESENTING ILLNESS:  Austin Morgan 66 y.o. male is here at the request of his nephrologist (Dr. Theador Hawthorne) for evaluation of pancytopenia.  Patient has had intermittent mild leukopenia since April 2023.  Most recent WBC (12/13/2021) 3.2 with normal differential.  He has had mild thrombocytopenia since June 2022, with most recent platelets (12/13/2021) at 93.  He has had normocytic anemia since 2021, with significant worsening of his anemia in April 2023 (Hgb 5.6) during admission to hospital for GI bleeding.  Most recent CBC (12/13/2021) shows Hgb 8.6 with MCV 91.3.  Iron panel checked by nephrologist (11/15/2021) shows ferritin 56, iron saturation 14%, and TIBC 359.  Per nephrology note, patient was started on oral iron supplementation.  He is not on any EPO injections at this time.  Patient was hospitalized in April 2023 for GI bleeding secondary to varices, requiring esophageal variceal banding and PRBC transfusion x 3.  He was hospitalized again in May 2023 with volume overload and symptomatic anemia requiring repeat EGD (10/31/2021) which showed portal gastropathy and grade 1 varices - he received Venofer 300 mg during hospitalization in May.  He follows with [facility] Neil Crouch, PA-C and Dr. Gala Romney) for his alcohol-related liver cirrhosis and GI bleeding.  Most recent abdominal ultrasound (12/07/2020) showed splenomegaly measuring 14 cm with volume 678 cc.  Patient denies any hematemesis, melena, or hematochezia since his most recent hospital stay.  He admits to easy bruising but denies petechial rash.  No frequent infections or B symptoms such as fever, chills, night sweats, unintentional  weight loss.  He reports significant fatigue as well as ice pica and restless legs.  He denies any headaches, chest pain, dyspnea on exertion, palpitations, lightheadedness, or syncope.  He is taking daily iron supplement for the past month along with vitamin C and multivitamin.  He denies any use of blood thinners or NSAIDs, but takes Protonix twice daily at the same time as his iron tablets.  He was also noted by nephrologist to have some abnormalities in his free light chains, which are thought to be secondary to his CKD stage IIIb.  He denies any new bone pain or B symptoms such as fever, chills, night sweats or unintentional weight loss.  His past medical history significant for CKD stage 3b, type 2 diabetes mellitus, anemia of chronic disease, hypertension, alcoholic liver cirrhosis, history of GI bleed from esophageal varices, restless legs, sleep apnea, and chronic diastolic CHF.  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  He admits to history of heavy alcohol use for 20 years, but has been sober since October 2022.  He smoked 0.5 PPD cigarettes for 30 years, but quit 10+ years ago.  He has history of drug use many years ago, but denies any IV drug use.  He denies any family history of blood abnormalities or cancer.   MEDICAL HISTORY:  Past Medical History:  Diagnosis Date   Anxiety    Arthritis    Depression    Diabetes mellitus without complication ([place])    ED (erectile dysfunction)    Gout    H/O ETOH abuse    Hyperlipidemia  Hypertension    Hypertriglyceridemia    PONV (postoperative nausea and vomiting)    Sleep apnea     SURGICAL HISTORY: Past Surgical History:  Procedure Laterality Date   CARPAL TUNNEL RELEASE Bilateral    COLONOSCOPY  2010   Dr. Delfin Edis: mild diverticulosis, next colonoscopy 2020   COLONOSCOPY WITH PROPOFOL N/A 01/21/2020   Rourk: 9 polyps removed,  multiple tubular adenomas.  Next colonoscopy in 3 years.   ELBOW SURGERY     ESOPHAGEAL BANDING  09/28/2021   Procedure: ESOPHAGEAL BANDING;  Surgeon: [name], MD;  [facility];  Service: Endoscopy;;   ESOPHAGOGASTRODUODENOSCOPY (EGD) WITH PROPOFOL N/A 01/21/2020   Rourk: normal   ESOPHAGOGASTRODUODENOSCOPY (EGD) WITH PROPOFOL N/A 09/28/2021   Procedure: ESOPHAGOGASTRODUODENOSCOPY (EGD) WITH PROPOFOL;  Surgeon: [name], MD;  [facility];  Service: Endoscopy;  Laterality: N/A;  3:00pm   ESOPHAGOGASTRODUODENOSCOPY (EGD) WITH PROPOFOL N/A 10/31/2021   Procedure: ESOPHAGOGASTRODUODENOSCOPY (EGD) WITH PROPOFOL;  Surgeon: Harvel Quale, MD;  [facility];  Service: Gastroenterology;  Laterality: N/A;   NECK SURGERY     c4   POLYPECTOMY  01/21/2020   Procedure: POLYPECTOMY;  Surgeon: [name], MD;  [facility];  Service: Endoscopy;;  colon    SOCIAL HISTORY: Social History   Socioeconomic History   Marital status: Married    Spouse name: Not on file   Number of children: Not on file   Years of education: Not on file   Highest education level: Not on file  Occupational History   Not on file  Tobacco Use   Smoking status: Former    Packs/day: 1.00    Years: 36.00    Total pack years: 36.00    Types: Cigarettes    Quit date: 03/11/2016    Years since quitting: 5.7    Passive exposure: Past   Smokeless tobacco: Never  Vaping Use   Vaping Use: Never used  Substance and Sexual Activity   Alcohol use: Not Currently    Comment: drinks a fifth of liquor per week; inpatient rehab 08/2019. denied 10/14/19. No etoh since 03/2021 (as of 09/22/21)   Drug use: No   Sexual activity: Yes  Other Topics Concern   Not on file  Social History Narrative   Wife, Jayant Kriz NP.   Social Determinants of Health   Financial Resource Strain: Not on file  Food Insecurity: Not on file  Transportation Needs: Not on file   Physical Activity: Not on file  Stress: Not on file  Social Connections: Not on file  Intimate Partner Violence: Not on file    FAMILY HISTORY: Family History  Problem Relation Age of Onset   Heart attack Father    Colon cancer Neg Hx     ALLERGIES:  is allergic to codeine.  MEDICATIONS:  Current Outpatient Medications  Medication Sig Dispense Refill   albuterol (VENTOLIN HFA) 108 (90 Base) MCG/ACT inhaler Inhale 2 puffs into the lungs every 6 (six) hours as needed for wheezing. 1 each 2   blood glucose meter kit and supplies KIT Dispense based on patient and insurance preference. Use up to BID as directed. 1 each 0   furosemide (LASIX) 20 MG tablet Take 1 tablet (20 mg total) by mouth daily. (Patient taking differently: Take 40 mg by mouth 2 (two) times daily.) 30 tablet 1   insulin detemir (LEVEMIR FLEXTOUCH) 100 UNIT/ML FlexPen Inject 14 Units into the skin every evening.  lactulose (CHRONULAC) 10 GM/15ML solution 15 mL twice daily (Patient taking differently: Take 30 mLs by mouth in the morning, at noon, in the evening, and at bedtime. 30 ml 4 times daily) 946 mL 6   Multiple Vitamins-Minerals (MULTIVITAMIN WITH MINERALS) tablet Take 1 tablet by mouth daily.     nadolol (CORGARD) 40 MG tablet Take 1/2 (one half) tablet po daily 45 tablet 1   naltrexone (DEPADE) 50 MG tablet TAKE ONE TABLET BY MOUTH ONCE DAILY. 90 tablet 1   ondansetron (ZOFRAN) 8 MG tablet TAKE 1 TABLET BY MOUTH THREE TIMES DAILY AS NEEDED FOR NAUSEA 20 tablet 3   ONETOUCH ULTRA test strip USE AS DIRECTED TO TEST BLOOD GLUCOSE TWICE DAILY. 50 each 1   pantoprazole (PROTONIX) 40 MG tablet Take 1 tablet (40 mg total) by mouth 2 (two) times daily. 60 tablet 3   pramipexole (MIRAPEX) 0.25 MG tablet Take 2 tablets (0.5 mg total) by mouth at bedtime. TAKE 1 TABLET BY MOUTH NIGHTLY AT  LEAST  2  HOURS  BEFORE  BEDTIME  FOR  RESTLESS  LEGS (Patient taking differently: Take 0.25 mg by mouth at bedtime. TAKE 1 TABLET BY  MOUTH NIGHTLY AT  LEAST  2  HOURS  BEFORE  BEDTIME  FOR  RESTLESS  LEGS)     spironolactone (ALDACTONE) 25 MG tablet Take 25 mg by mouth daily.     traZODone (DESYREL) 100 MG tablet Take 1 tablet (100 mg total) by mouth at bedtime as needed. for sleep (Patient taking differently: Take 50 mg by mouth at bedtime as needed. for sleep) 90 tablet 3   No current facility-administered medications for this visit.    REVIEW OF SYSTEMS:   Review of Systems  Constitutional:  Positive for fatigue. Negative for appetite change, chills, diaphoresis, fever and unexpected weight change.  HENT:   Negative for lump/mass and nosebleeds.   Eyes:  Negative for eye problems.  Respiratory:  Negative for cough, hemoptysis and shortness of breath.   Cardiovascular:  Negative for chest pain, leg swelling and palpitations.  Gastrointestinal:  Positive for nausea. Negative for abdominal pain, blood in stool, constipation, diarrhea and vomiting.  Genitourinary:  Negative for hematuria.   Skin: Negative.   Neurological:  Positive for dizziness. Negative for headaches and light-headedness.  Hematological:  Does not bruise/bleed easily.      PHYSICAL EXAMINATION: ECOG PERFORMANCE STATUS: 1 - Symptomatic but completely ambulatory  There were no vitals filed for this visit. There were no vitals filed for this visit.  Physical Exam Constitutional:      Appearance: Normal appearance. He is obese.  HENT:     Head: Normocephalic and atraumatic.     Mouth/Throat:     Mouth: Mucous membranes are moist.  Eyes:     Extraocular Movements: Extraocular movements intact.     Pupils: Pupils are equal, round, and reactive to light.  Cardiovascular:     Rate and Rhythm: Normal rate and regular rhythm.     Pulses: Normal pulses.     Heart sounds: Normal heart sounds.  Pulmonary:     Effort: Pulmonary effort is normal.     Breath sounds: Normal breath sounds.  Abdominal:     General: Bowel sounds are normal.      Palpations: Abdomen is soft.     Tenderness: There is no abdominal tenderness.  Musculoskeletal:        General: No swelling.     Right lower leg: Edema (3+) present.  Left lower leg: Edema (3+) present.  Lymphadenopathy:     Cervical: No cervical adenopathy.  Skin:    General: Skin is warm and dry.  Neurological:     General: No focal deficit present.     Mental Status: He is alert and oriented to person, place, and time.  Psychiatric:        Mood and Affect: Mood normal.        Behavior: Behavior normal.       LABORATORY DATA:  I have reviewed the data as listed Recent Results (from the past 2160 hour(s))  CBC     Status: Abnormal   Collection Time: 09/21/21  6:00 AM  Result Value Ref Range   WBC 4.1 4.0 - 10.5 K/uL   RBC 2.46 (L) 4.22 - 5.81 MIL/uL   Hemoglobin 7.0 (L) 13.0 - 17.0 g/dL    Comment: POST TRANSFUSION SPECIMEN DELTA CHECK NOTED    HCT 21.7 (L) 39.0 - 52.0 %   MCV 88.2 80.0 - 100.0 fL   MCH 28.5 26.0 - 34.0 pg   MCHC 32.3 30.0 - 36.0 g/dL   RDW 14.3 11.5 - 15.5 %   Platelets 127 (L) 150 - 400 K/uL    Comment: Immature Platelet Fraction may be clinically indicated, consider ordering this additional test [id number]    nRBC 0.0 0.0 - 0.2 %    Comment: Performed at [facility], [address], [place], [place] [address]  Basic metabolic panel     Status: Abnormal   Collection Time: 09/21/21  6:00 AM  Result Value Ref Range   Sodium 137 135 - 145 mmol/L   Potassium 4.2 3.5 - 5.1 mmol/L   Chloride 109 98 - 111 mmol/L   CO2 22 22 - 32 mmol/L   Glucose, Bld 112 (H) 70 - 99 mg/dL    Comment: Glucose reference range applies only to samples taken after fasting for at least 8 hours.   BUN 28 (H) 8 - 23 mg/dL   Creatinine, Ser 1.65 (H) 0.61 - 1.24 mg/dL   Calcium 8.8 (L) 8.9 - 10.3 mg/dL   GFR, Estimated 46 (L) >60 mL/min    Comment: (NOTE) Calculated using the CKD-EPI Creatinine Equation (2021)    Anion gap 6 5 - 15    Comment: Performed at [facility], [address] [place], [place] [address]  HIV Antibody (routine testing w rflx)     Status: None   Collection Time: 09/21/21  6:00 AM  Result Value Ref Range   HIV Screen 4th Generation wRfx Non Reactive Non Reactive    Comment: Performed at [facility], [place] [address], [place], [place] [address]  Glucose, capillary     Status: Abnormal   Collection Time: 09/21/21  7:13 AM  Result Value Ref Range   Glucose-Capillary 129 (H) 70 - 99 mg/dL    Comment: Glucose reference range applies only to samples taken after fasting for at least 8 hours.  Glucose, capillary     Status: Abnormal   Collection Time: 09/21/21 11:08 AM  Result Value Ref Range   Glucose-Capillary 160 (H) 70 - 99 mg/dL    Comment: Glucose reference range applies only to samples taken after fasting for at least 8 hours.  Glucose, capillary     Status: None   Collection Time: 09/21/21  4:00 PM  Result Value Ref Range   Glucose-Capillary 82 70 - 99 mg/dL    Comment: Glucose reference range applies only to  samples taken after fasting for at least 8 hours.  CBC with Differential/Platelet     Status: Abnormal   Collection Time: 09/22/21 11:08 AM  Result Value Ref Range   WBC 4.2 3.4 - 10.8 x10E3/uL   RBC 2.93 (L) 4.14 - 5.80 x10E6/uL   Hemoglobin 8.7 (L) 13.0 - 17.7 g/dL   Hematocrit 25.1 (L) 37.5 - 51.0 %   MCV 86 79 - 97 fL   MCH 29.7 26.6 - 33.0 pg   MCHC 34.7 31.5 - 35.7 g/dL   RDW 13.5 11.6 - 15.4 %   Platelets 124 (L) 150 - 450 x10E3/uL   Neutrophils 59 Not Estab. %   Lymphs 23 Not Estab. %   Monocytes 10 Not Estab. %   Eos 7 Not Estab. %   Basos 1 Not Estab. %   Neutrophils Absolute 2.5 1.4 - 7.0 x10E3/uL   Lymphocytes Absolute 1.0 0.7 - 3.1 x10E3/uL   Monocytes Absolute 0.4 0.1 - 0.9 x10E3/uL   EOS (ABSOLUTE) 0.3 0.0 - 0.4 x10E3/uL   Basophils Absolute 0.0 0.0 - 0.2 x10E3/uL   Immature Granulocytes 0 Not Estab. %   Immature Grans (Abs) 0.0 0.0 - 0.1 x10E3/uL  TSH + free T4     Status: None    Collection Time: 09/22/21 11:08 AM  Result Value Ref Range   TSH 3.290 0.450 - 4.500 uIU/mL   Free T4 1.12 0.82 - 1.77 ng/dL  Basic metabolic panel     Status: Abnormal   Collection Time: 09/22/21 11:08 AM  Result Value Ref Range   Glucose 85 70 - 99 mg/dL   BUN 23 8 - 27 mg/dL   Creatinine, Ser 1.33 (H) 0.76 - 1.27 mg/dL   eGFR 59 (L) >59 mL/min/1.73   BUN/Creatinine Ratio 17 10 - 24   Sodium 139 134 - 144 mmol/L   Potassium 4.2 3.5 - 5.2 mmol/L   Chloride 107 (H) 96 - 106 mmol/L   CO2 21 20 - 29 mmol/L   Calcium 9.1 8.6 - 10.2 mg/dL  AFP tumor marker     Status: None   Collection Time: 09/22/21 11:08 AM  Result Value Ref Range   AFP, Serum, Tumor Marker <1.8 0.0 - 8.4 ng/mL    Comment: [employer] Electrochemiluminescence Immunoassay (ECLIA) Values obtained with different assay methods or kits cannot be used interchangeably.  Results cannot be interpreted as absolute evidence of the presence or absence of malignant disease. This test is not interpretable in pregnant females.   Specimen status report     Status: None   Collection Time: 09/22/21 11:08 AM  Result Value Ref Range   specimen status report Comment     Comment: Written Authorization Written Authorization Written Authorization Received. Authorization received from [place] 09-25-2021 Logged by Summer Kincheloe   POCT hemoglobin     Status: Abnormal   Collection Time: 09/25/21  8:49 AM  Result Value Ref Range   Hemoglobin 7.8 (A) 11 - 14.6 g/dL  Comprehensive metabolic panel     Status: Abnormal   Collection Time: 09/25/21  9:48 AM  Result Value Ref Range   Sodium 134 (L) 135 - 145 mmol/L   Potassium 4.1 3.5 - 5.1 mmol/L   Chloride 105 98 - 111 mmol/L   CO2 21 (L) 22 - 32 mmol/L   Glucose, Bld 311 (H) 70 - 99 mg/dL    Comment: Glucose reference range applies only to samples taken after fasting for at least 8 hours.   BUN  20 8 - 23 mg/dL   Creatinine, Ser 1.45 (H) 0.61 - 1.24 mg/dL   Calcium 8.8  (L) 8.9 - 10.3 mg/dL   Total Protein 6.8 6.5 - 8.1 g/dL   Albumin 3.0 (L) 3.5 - 5.0 g/dL   AST 31 15 - 41 U/L   ALT 19 0 - 44 U/L   Alkaline Phosphatase 114 38 - 126 U/L   Total Bilirubin 0.8 0.3 - 1.2 mg/dL   GFR, Estimated 53 (L) >60 mL/min    Comment: (NOTE) Calculated using the CKD-EPI Creatinine Equation (2021)    Anion gap 8 5 - 15    Comment: Performed at [facility], [address], [place], [place] [address]  CBC with Differential/Platelet     Status: Abnormal   Collection Time: 09/25/21  9:48 AM  Result Value Ref Range   WBC 3.2 (L) 4.0 - 10.5 K/uL   RBC 2.72 (L) 4.22 - 5.81 MIL/uL   Hemoglobin 7.9 (L) 13.0 - 17.0 g/dL   HCT 24.6 (L) 39.0 - 52.0 %   MCV 90.4 80.0 - 100.0 fL   MCH 29.0 26.0 - 34.0 pg   MCHC 32.1 30.0 - 36.0 g/dL   RDW 14.2 11.5 - 15.5 %   Platelets 110 (L) 150 - 400 K/uL    Comment: Immature Platelet Fraction may be clinically indicated, consider ordering this additional test [id number]    nRBC 0.0 0.0 - 0.2 %   Neutrophils Relative % 62 %   Neutro Abs 2.0 1.7 - 7.7 K/uL   Lymphocytes Relative 20 %   Lymphs Abs 0.7 0.7 - 4.0 K/uL   Monocytes Relative 10 %   Monocytes Absolute 0.3 0.1 - 1.0 K/uL   Eosinophils Relative 7 %   Eosinophils Absolute 0.2 0.0 - 0.5 K/uL   Basophils Relative 1 %   Basophils Absolute 0.0 0.0 - 0.1 K/uL   Immature Granulocytes 0 %   Abs Immature Granulocytes 0.01 0.00 - 0.07 K/uL    Comment: Performed at [facility], [address] [place], [place] [address]  CBC with Differential/Platelet     Status: Abnormal   Collection Time: 09/27/21  9:51 AM  Result Value Ref Range   WBC 4.1 4.0 - 10.5 K/uL   RBC 2.95 (L) 4.22 - 5.81 MIL/uL   Hemoglobin 8.2 (L) 13.0 - 17.0 g/dL   HCT 26.1 (L) 39.0 - 52.0 %   MCV 88.5 80.0 - 100.0 fL   MCH 27.8 26.0 - 34.0 pg   MCHC 31.4 30.0 - 36.0 g/dL   RDW 14.0 11.5 - 15.5 %   Platelets 114 (L) 150 - 400 K/uL    Comment: SPECIMEN CHECKED FOR CLOTS Immature Platelet Fraction may  be clinically indicated, consider ordering this additional test [id number] CONSISTENT WITH PREVIOUS RESULT REPEATED TO VERIFY    nRBC 0.0 0.0 - 0.2 %   Neutrophils Relative % 59 %   Neutro Abs 2.4 1.7 - 7.7 K/uL   Lymphocytes Relative 23 %   Lymphs Abs 1.0 0.7 - 4.0 K/uL   Monocytes Relative 10 %   Monocytes Absolute 0.4 0.1 - 1.0 K/uL   Eosinophils Relative 7 %   Eosinophils Absolute 0.3 0.0 - 0.5 K/uL   Basophils Relative 1 %   Basophils Absolute 0.0 0.0 - 0.1 K/uL   WBC Morphology MORPHOLOGY UNREMARKABLE    Immature Granulocytes 0 %   Abs Immature Granulocytes 0.01 0.00 - 0.07 K/uL   Ovalocytes PRESENT     Comment: Performed  at [facility], [address] [place], [place] [address]  Comprehensive metabolic panel     Status: Abnormal   Collection Time: 09/27/21  9:51 AM  Result Value Ref Range   Sodium 136 135 - 145 mmol/L   Potassium 3.8 3.5 - 5.1 mmol/L   Chloride 103 98 - 111 mmol/L   CO2 24 22 - 32 mmol/L   Glucose, Bld 131 (H) 70 - 99 mg/dL    Comment: Glucose reference range applies only to samples taken after fasting for at least 8 hours.   BUN 23 8 - 23 mg/dL   Creatinine, Ser 1.45 (H) 0.61 - 1.24 mg/dL   Calcium 9.0 8.9 - 10.3 mg/dL   Total Protein 7.2 6.5 - 8.1 g/dL   Albumin 3.2 (L) 3.5 - 5.0 g/dL   AST 28 15 - 41 U/L   ALT 18 0 - 44 U/L   Alkaline Phosphatase 115 38 - 126 U/L   Total Bilirubin 0.9 0.3 - 1.2 mg/dL   GFR, Estimated 53 (L) >60 mL/min    Comment: (NOTE) Calculated using the CKD-EPI Creatinine Equation (2021)    Anion gap 9 5 - 15    Comment: Performed at [facility], [address], [place], [place] [address]  Type and screen [facility]     Status: None   Collection Time: 09/27/21 11:09 AM  Result Value Ref Range   ABO/RH(D) O POS    Antibody Screen NEG    Sample Expiration      09/30/2021,2359 Performed at [facility], [address], [place], [place] [address]   Glucose, capillary     Status: Abnormal   Collection Time:  09/28/21  8:03 AM  Result Value Ref Range   Glucose-Capillary 112 (H) 70 - 99 mg/dL    Comment: Glucose reference range applies only to samples taken after fasting for at least 8 hours.  ECHOCARDIOGRAM COMPLETE     Status: None   Collection Time: 09/29/21  2:37 PM  Result Value Ref Range   AR max vel 1.52 cm2   AV Area VTI 1.52 cm2   AV Mean grad 11.0 mmHg   AV Peak grad 19.0 mmHg   Ao pk vel 2.18 m/s   AV Area mean vel 1.63 cm2   Area-P 1/2 2.76 cm2   S' Lateral 3.50 cm   MV M vel 5.43 m/s   MV Peak grad 117.9 mmHg  CBC with Differential     Status: Abnormal   Collection Time: 10/03/21  9:33 AM  Result Value Ref Range   WBC 4.3 3.4 - 10.8 x10E3/uL   RBC 3.19 (L) 4.14 - 5.80 x10E6/uL   Hemoglobin 9.1 (L) 13.0 - 17.7 g/dL   Hematocrit 27.7 (L) 37.5 - 51.0 %   MCV 87 79 - 97 fL   MCH 28.5 26.6 - 33.0 pg   MCHC 32.9 31.5 - 35.7 g/dL   RDW 13.1 11.6 - 15.4 %   Platelets 123 (L) 150 - 450 x10E3/uL   Neutrophils 54 Not Estab. %   Lymphs 25 Not Estab. %   Monocytes 10 Not Estab. %   Eos 10 Not Estab. %   Basos 1 Not Estab. %   Neutrophils Absolute 2.3 1.4 - 7.0 x10E3/uL   Lymphocytes Absolute 1.1 0.7 - 3.1 x10E3/uL   Monocytes Absolute 0.4 0.1 - 0.9 x10E3/uL   EOS (ABSOLUTE) 0.4 0.0 - 0.4 x10E3/uL   Basophils Absolute 0.0 0.0 - 0.2 x10E3/uL   Immature Granulocytes  0 Not Estab. %   Immature Grans (Abs) 0.0 0.0 - 0.1 x10E3/uL  CBC with Differential     Status: Abnormal   Collection Time: 10/24/21  8:59 AM  Result Value Ref Range   WBC 4.6 3.4 - 10.8 x10E3/uL   RBC 2.76 (L) 4.14 - 5.80 x10E6/uL   Hemoglobin 7.8 (L) 13.0 - 17.7 g/dL   Hematocrit 23.9 (L) 37.5 - 51.0 %   MCV 87 79 - 97 fL   MCH 28.3 26.6 - 33.0 pg   MCHC 32.6 31.5 - 35.7 g/dL   RDW 14.5 11.6 - 15.4 %   Platelets 103 (L) 150 - 450 x10E3/uL   Neutrophils 62 Not Estab. %   Lymphs 21 Not Estab. %   Monocytes 9 Not Estab. %   Eos 7 Not Estab. %   Basos 1 Not Estab. %   Neutrophils Absolute 2.8 1.4 - 7.0  x10E3/uL   Lymphocytes Absolute 1.0 0.7 - 3.1 x10E3/uL   Monocytes Absolute 0.4 0.1 - 0.9 x10E3/uL   EOS (ABSOLUTE) 0.3 0.0 - 0.4 x10E3/uL   Basophils Absolute 0.0 0.0 - 0.2 x10E3/uL   Immature Granulocytes 0 Not Estab. %   Immature Grans (Abs) 0.0 0.0 - 0.1 x10E3/uL  Iron Binding Cap (TIBC)([facility])     Status: Abnormal   Collection Time: 10/24/21  8:59 AM  Result Value Ref Range   Total Iron Binding Capacity 368 250 - 450 ug/dL   UIBC 337 111 - 343 ug/dL   Iron 31 (L) 38 - 169 ug/dL   Iron Saturation 8 (LL) 15 - 55 %  Ferritin     Status: Abnormal   Collection Time: 10/24/21  8:59 AM  Result Value Ref Range   Ferritin 25 (L) 30 - 400 ng/mL  Hepatic function panel     Status: Abnormal   Collection Time: 10/24/21  8:59 AM  Result Value Ref Range   Total Protein 7.2 6.0 - 8.5 g/dL   Albumin 3.7 (L) 3.8 - 4.8 g/dL   Bilirubin Total 0.5 0.0 - 1.2 mg/dL   Bilirubin, Direct 0.21 0.00 - 0.40 mg/dL   Alkaline Phosphatase 111 44 - 121 IU/L   AST 25 0 - 40 IU/L   ALT 16 0 - 44 IU/L  Basic Metabolic Panel (BMET)     Status: Abnormal   Collection Time: 10/24/21  8:59 AM  Result Value Ref Range   Glucose 68 (L) 70 - 99 mg/dL   BUN 34 (H) 8 - 27 mg/dL   Creatinine, Ser 2.53 (H) 0.76 - 1.27 mg/dL   eGFR 27 (L) >59 mL/min/1.73   BUN/Creatinine Ratio 13 10 - 24   Sodium 139 134 - 144 mmol/L   Potassium 4.7 3.5 - 5.2 mmol/L   Chloride 102 96 - 106 mmol/L   CO2 21 20 - 29 mmol/L   Calcium 9.0 8.6 - 10.2 mg/dL  Brain natriuretic peptide     Status: Abnormal   Collection Time: 10/30/21 10:51 AM  Result Value Ref Range   B Natriuretic Peptide 709.0 (H) 0.0 - 100.0 pg/mL    Comment: Performed at [facility], [address], [place], [place] [address]  Lipase, blood     Status: None   Collection Time: 10/30/21 10:51 AM  Result Value Ref Range   Lipase 21 11 - 51 U/L    Comment: Performed at [facility], [address], [place], [place] [address]  Comprehensive metabolic panel      Status: Abnormal  Collection Time: 10/30/21 10:51 AM  Result Value Ref Range   Sodium 135 135 - 145 mmol/L   Potassium 4.6 3.5 - 5.1 mmol/L   Chloride 107 98 - 111 mmol/L   CO2 24 22 - 32 mmol/L   Glucose, Bld 88 70 - 99 mg/dL    Comment: Glucose reference range applies only to samples taken after fasting for at least 8 hours.   BUN 32 (H) 8 - 23 mg/dL   Creatinine, Ser 2.02 (H) 0.61 - 1.24 mg/dL   Calcium 9.1 8.9 - 10.3 mg/dL   Total Protein 7.5 6.5 - 8.1 g/dL   Albumin 3.4 (L) 3.5 - 5.0 g/dL   AST 27 15 - 41 U/L   ALT 21 0 - 44 U/L   Alkaline Phosphatase 82 38 - 126 U/L   Total Bilirubin 0.8 0.3 - 1.2 mg/dL   GFR, Estimated 36 (L) >60 mL/min    Comment: (NOTE) Calculated using the CKD-EPI Creatinine Equation (2021)    Anion gap 4 (L) 5 - 15    Comment: Performed at [facility], [address], [place], [place] [address]  CBC with Differential/Platelet     Status: Abnormal   Collection Time: 10/30/21 10:51 AM  Result Value Ref Range   WBC 4.3 4.0 - 10.5 K/uL   RBC 2.70 (L) 4.22 - 5.81 MIL/uL   Hemoglobin 7.5 (L) 13.0 - 17.0 g/dL   HCT 24.8 (L) 39.0 - 52.0 %   MCV 91.9 80.0 - 100.0 fL   MCH 27.8 26.0 - 34.0 pg   MCHC 30.2 30.0 - 36.0 g/dL   RDW 15.7 (H) 11.5 - 15.5 %   Platelets 105 (L) 150 - 400 K/uL    Comment: Immature Platelet Fraction may be clinically indicated, consider ordering this additional test [id number]    nRBC 0.0 0.0 - 0.2 %   Neutrophils Relative % 60 %   Neutro Abs 2.6 1.7 - 7.7 K/uL   Lymphocytes Relative 22 %   Lymphs Abs 0.9 0.7 - 4.0 K/uL   Monocytes Relative 10 %   Monocytes Absolute 0.4 0.1 - 1.0 K/uL   Eosinophils Relative 7 %   Eosinophils Absolute 0.3 0.0 - 0.5 K/uL   Basophils Relative 1 %   Basophils Absolute 0.0 0.0 - 0.1 K/uL   Immature Granulocytes 0 %   Abs Immature Granulocytes 0.01 0.00 - 0.07 K/uL    Comment: Performed at [facility], [address] [place], [place] [address]  CBC     Status: Abnormal   Collection Time:  10/30/21  7:17 PM  Result Value Ref Range   WBC 4.5 4.0 - 10.5 K/uL   RBC 2.70 (L) 4.22 - 5.81 MIL/uL   Hemoglobin 7.7 (L) 13.0 - 17.0 g/dL   HCT 24.2 (L) 39.0 - 52.0 %   MCV 89.6 80.0 - 100.0 fL   MCH 28.5 26.0 - 34.0 pg   MCHC 31.8 30.0 - 36.0 g/dL   RDW 15.7 (H) 11.5 - 15.5 %   Platelets 112 (L) 150 - 400 K/uL    Comment: SPECIMEN CHECKED FOR CLOTS Immature Platelet Fraction may be clinically indicated, consider ordering this additional test [id number] CONSISTENT WITH PREVIOUS RESULT REPEATED TO VERIFY    nRBC 0.0 0.0 - 0.2 %    Comment: Performed at [facility], [address], [place], [place] [address]  Type and screen [facility]     Status: None   Collection Time: 10/30/21  7:17 PM  Result  Value Ref Range   ABO/RH(D) O POS    Antibody Screen NEG    Sample Expiration 11/02/2021,2359    Unit Number [id number]    Blood Component Type RED CELLS,LR    Unit division 00    Status of Unit ISSUED,FINAL    Transfusion Status OK TO TRANSFUSE    Crossmatch Result      Compatible Performed at [facility], [address] [place], [place] [address]   Magnesium     Status: None   Collection Time: 10/30/21  7:17 PM  Result Value Ref Range   Magnesium 1.8 1.7 - 2.4 mg/dL    Comment: Performed at [facility], [address], [place], [place] [address]  Prepare RBC (crossmatch)     Status: None   Collection Time: 10/30/21  7:17 PM  Result Value Ref Range   Order Confirmation      ORDER PROCESSED BY BLOOD BANK Performed at [facility], [address], [place], [place] [address]   BPAM RBC     Status: None   Collection Time: 10/30/21  7:17 PM  Result Value Ref Range   ISSUE DATE / TIME [id number]    Blood Product Unit Number [id number]    PRODUCT CODE [id number]    Unit Type and Rh 5100    Blood Product Expiration Date 062694854627   TSH     Status: None   Collection Time: 10/30/21  7:18 PM  Result Value Ref Range   TSH 3.704 0.350 - 4.500 uIU/mL    Comment:  Performed by a 3rd Generation assay with a functional sensitivity of <=0.01 uIU/mL. Performed at [facility], [address], [place], [place] [address]   Protime-INR     Status: Abnormal   Collection Time: 10/30/21  8:17 PM  Result Value Ref Range   Prothrombin Time 15.9 (H) 11.4 - 15.2 seconds   INR 1.3 (H) 0.8 - 1.2    Comment: (NOTE) INR goal varies based on device and disease states. Performed at [facility], [address], [place], [place] [address]   Glucose, capillary     Status: None   Collection Time: 10/30/21 10:05 PM  Result Value Ref Range   Glucose-Capillary 87 70 - 99 mg/dL    Comment: Glucose reference range applies only to samples taken after fasting for at least 8 hours.  Urinalysis, Routine w reflex microscopic     Status: Abnormal   Collection Time: 10/30/21 10:45 PM  Result Value Ref Range   Color, Urine STRAW (A) YELLOW   APPearance CLEAR CLEAR   Specific Gravity, Urine 1.004 (L) 1.005 - 1.030   pH 6.0 5.0 - 8.0   Glucose, UA NEGATIVE NEGATIVE mg/dL   Hgb urine dipstick NEGATIVE NEGATIVE   Bilirubin Urine NEGATIVE NEGATIVE   Ketones, ur NEGATIVE NEGATIVE mg/dL   Protein, ur NEGATIVE NEGATIVE mg/dL   Nitrite NEGATIVE NEGATIVE   Leukocytes,Ua NEGATIVE NEGATIVE    Comment: Performed at [facility], [address], [place],  [address]  Respiratory (~20 pathogens) panel by PCR     Status: None   Collection Time: 10/30/21 11:30 PM   Specimen: Nasopharyngeal Swab; Respiratory  Result Value Ref Range   Adenovirus NOT DETECTED NOT DETECTED   Coronavirus 229E NOT DETECTED NOT DETECTED    Comment: (NOTE) The Coronavirus on the Respiratory Panel, DOES NOT test for the novel  Coronavirus (2019 nCoV)    Coronavirus HKU1 NOT DETECTED NOT DETECTED   Coronavirus NL63 NOT DETECTED NOT DETECTED  Coronavirus OC43 NOT DETECTED NOT DETECTED   Metapneumovirus NOT DETECTED NOT DETECTED   Rhinovirus / Enterovirus NOT DETECTED NOT DETECTED   Influenza A NOT  DETECTED NOT DETECTED   Influenza B NOT DETECTED NOT DETECTED   Parainfluenza Virus 1 NOT DETECTED NOT DETECTED   Parainfluenza Virus 2 NOT DETECTED NOT DETECTED   Parainfluenza Virus 3 NOT DETECTED NOT DETECTED   Parainfluenza Virus 4 NOT DETECTED NOT DETECTED   Respiratory Syncytial Virus NOT DETECTED NOT DETECTED   Bordetella pertussis NOT DETECTED NOT DETECTED   Bordetella Parapertussis NOT DETECTED NOT DETECTED   Chlamydophila pneumoniae NOT DETECTED NOT DETECTED   Mycoplasma pneumoniae NOT DETECTED NOT DETECTED    Comment: Performed at [facility], [place] [address], [place], [place] [address]  CBC     Status: Abnormal   Collection Time: 10/31/21  1:25 AM  Result Value Ref Range   WBC 4.2 4.0 - 10.5 K/uL   RBC 2.48 (L) 4.22 - 5.81 MIL/uL   Hemoglobin 7.1 (L) 13.0 - 17.0 g/dL   HCT 22.5 (L) 39.0 - 52.0 %   MCV 90.7 80.0 - 100.0 fL   MCH 28.6 26.0 - 34.0 pg   MCHC 31.6 30.0 - 36.0 g/dL   RDW 15.7 (H) 11.5 - 15.5 %   Platelets 96 (L) 150 - 400 K/uL    Comment: SPECIMEN CHECKED FOR CLOTS Immature Platelet Fraction may be clinically indicated, consider ordering this additional test [id number] CONSISTENT WITH PREVIOUS RESULT    nRBC 0.0 0.0 - 0.2 %    Comment: Performed at [facility], [address] [place], [place] [address]  Comprehensive metabolic panel     Status: Abnormal   Collection Time: 10/31/21  1:25 AM  Result Value Ref Range   Sodium 135 135 - 145 mmol/L   Potassium 4.1 3.5 - 5.1 mmol/L   Chloride 107 98 - 111 mmol/L   CO2 23 22 - 32 mmol/L   Glucose, Bld 80 70 - 99 mg/dL    Comment: Glucose reference range applies only to samples taken after fasting for at least 8 hours.   BUN 29 (H) 8 - 23 mg/dL   Creatinine, Ser 1.99 (H) 0.61 - 1.24 mg/dL   Calcium 8.7 (L) 8.9 - 10.3 mg/dL   Total Protein 6.9 6.5 - 8.1 g/dL   Albumin 3.2 (L) 3.5 - 5.0 g/dL   AST 28 15 - 41 U/L   ALT 19 0 - 44 U/L   Alkaline Phosphatase 80 38 - 126 U/L   Total Bilirubin 0.6 0.3 -  1.2 mg/dL   GFR, Estimated 37 (L) >60 mL/min    Comment: (NOTE) Calculated using the CKD-EPI Creatinine Equation (2021)    Anion gap 5 5 - 15    Comment: Performed at [facility], [address] [place], [place] [address]  CBC     Status: Abnormal   Collection Time: 10/31/21  7:19 AM  Result Value Ref Range   WBC 3.7 (L) 4.0 - 10.5 K/uL   RBC 2.50 (L) 4.22 - 5.81 MIL/uL   Hemoglobin 7.0 (L) 13.0 - 17.0 g/dL   HCT 22.6 (L) 39.0 - 52.0 %   MCV 90.4 80.0 - 100.0 fL   MCH 28.0 26.0 - 34.0 pg   MCHC 31.0 30.0 - 36.0 g/dL   RDW 15.8 (H) 11.5 - 15.5 %   Platelets 98 (L) 150 - 400 K/uL    Comment: Immature Platelet Fraction may be clinically indicated, consider ordering  this additional test [id number] CONSISTENT WITH PREVIOUS RESULT    nRBC 0.0 0.0 - 0.2 %    Comment: Performed at [facility], [address] [place], [place] [address]  Glucose, capillary     Status: None   Collection Time: 10/31/21  7:31 AM  Result Value Ref Range   Glucose-Capillary 82 70 - 99 mg/dL    Comment: Glucose reference range applies only to samples taken after fasting for at least 8 hours.  Glucose, capillary     Status: None   Collection Time: 10/31/21 11:56 AM  Result Value Ref Range   Glucose-Capillary 93 70 - 99 mg/dL    Comment: Glucose reference range applies only to samples taken after fasting for at least 8 hours.  Glucose, capillary     Status: None   Collection Time: 10/31/21  2:07 PM  Result Value Ref Range   Glucose-Capillary 88 70 - 99 mg/dL    Comment: Glucose reference range applies only to samples taken after fasting for at least 8 hours.  Glucose, capillary     Status: None   Collection Time: 10/31/21  3:43 PM  Result Value Ref Range   Glucose-Capillary 80 70 - 99 mg/dL    Comment: Glucose reference range applies only to samples taken after fasting for at least 8 hours.  Hemoglobin and hematocrit, blood     Status: Abnormal   Collection Time: 10/31/21  4:48 PM  Result Value Ref  Range   Hemoglobin 8.1 (L) 13.0 - 17.0 g/dL   HCT 25.4 (L) 39.0 - 52.0 %    Comment: Performed at [facility], [address], [place], [place] [address]  Glucose, capillary     Status: Abnormal   Collection Time: 10/31/21  5:09 PM  Result Value Ref Range   Glucose-Capillary 146 (H) 70 - 99 mg/dL    Comment: Glucose reference range applies only to samples taken after fasting for at least 8 hours.  Glucose, capillary     Status: Abnormal   Collection Time: 10/31/21 10:12 PM  Result Value Ref Range   Glucose-Capillary 128 (H) 70 - 99 mg/dL    Comment: Glucose reference range applies only to samples taken after fasting for at least 8 hours.  Comprehensive metabolic panel     Status: Abnormal   Collection Time: 11/01/21  4:46 AM  Result Value Ref Range   Sodium 133 (L) 135 - 145 mmol/L   Potassium 4.9 3.5 - 5.1 mmol/L   Chloride 103 98 - 111 mmol/L   CO2 23 22 - 32 mmol/L   Glucose, Bld 108 (H) 70 - 99 mg/dL    Comment: Glucose reference range applies only to samples taken after fasting for at least 8 hours.   BUN 32 (H) 8 - 23 mg/dL   Creatinine, Ser 2.29 (H) 0.61 - 1.24 mg/dL   Calcium 8.7 (L) 8.9 - 10.3 mg/dL   Total Protein 7.0 6.5 - 8.1 g/dL   Albumin 3.2 (L) 3.5 - 5.0 g/dL   AST 28 15 - 41 U/L   ALT 19 0 - 44 U/L   Alkaline Phosphatase 76 38 - 126 U/L   Total Bilirubin 1.2 0.3 - 1.2 mg/dL   GFR, Estimated 31 (L) >60 mL/min    Comment: (NOTE) Calculated using the CKD-EPI Creatinine Equation (2021)    Anion gap 7 5 - 15    Comment: Performed at [facility], [address], [place], [place] [address]  Magnesium     Status:  Abnormal   Collection Time: 11/01/21  4:46 AM  Result Value Ref Range   Magnesium 1.6 (L) 1.7 - 2.4 mg/dL    Comment: Performed at [facility], [address] [place], [place] [address]  Hemoglobin and hematocrit, blood     Status: Abnormal   Collection Time: 11/01/21  4:46 AM  Result Value Ref Range   Hemoglobin 8.0 (L) 13.0 - 17.0 g/dL   HCT 24.6  (L) 39.0 - 52.0 %    Comment: Performed at [facility], [address] [place], [place] [address]  Glucose, capillary     Status: Abnormal   Collection Time: 11/01/21  7:36 AM  Result Value Ref Range   Glucose-Capillary 110 (H) 70 - 99 mg/dL    Comment: Glucose reference range applies only to samples taken after fasting for at least 8 hours.  Protime-INR     Status: Abnormal   Collection Time: 11/01/21 10:01 AM  Result Value Ref Range   Prothrombin Time 17.1 (H) 11.4 - 15.2 seconds   INR 1.4 (H) 0.8 - 1.2    Comment: (NOTE) INR goal varies based on device and disease states. Performed at [facility], [address] [place], [place] [address]   Glucose, capillary     Status: Abnormal   Collection Time: 11/01/21 11:19 AM  Result Value Ref Range   Glucose-Capillary 143 (H) 70 - 99 mg/dL    Comment: Glucose reference range applies only to samples taken after fasting for at least 8 hours.  Glucose, capillary     Status: Abnormal   Collection Time: 11/01/21  4:10 PM  Result Value Ref Range   Glucose-Capillary 149 (H) 70 - 99 mg/dL    Comment: Glucose reference range applies only to samples taken after fasting for at least 8 hours.  Hemoglobin and hematocrit, blood     Status: Abnormal   Collection Time: 11/01/21  5:52 PM  Result Value Ref Range   Hemoglobin 8.6 (L) 13.0 - 17.0 g/dL   HCT 26.7 (L) 39.0 - 52.0 %    Comment: Performed at [facility], [address] [place], [place] [address]  Glucose, capillary     Status: Abnormal   Collection Time: 11/01/21  9:09 PM  Result Value Ref Range   Glucose-Capillary 169 (H) 70 - 99 mg/dL    Comment: Glucose reference range applies only to samples taken after fasting for at least 8 hours.  Hemoglobin and hematocrit, blood     Status: Abnormal   Collection Time: 11/02/21  4:52 AM  Result Value Ref Range   Hemoglobin 8.1 (L) 13.0 - 17.0 g/dL   HCT 25.3 (L) 39.0 - 52.0 %    Comment: Performed at [facility], [address],  [place], [place] [address]  Renal function panel     Status: Abnormal   Collection Time: 11/02/21  4:52 AM  Result Value Ref Range   Sodium 132 (L) 135 - 145 mmol/L   Potassium 4.4 3.5 - 5.1 mmol/L   Chloride 101 98 - 111 mmol/L   CO2 25 22 - 32 mmol/L   Glucose, Bld 139 (H) 70 - 99 mg/dL    Comment: Glucose reference range applies only to samples taken after fasting for at least 8 hours.   BUN 31 (H) 8 - 23 mg/dL   Creatinine, Ser 2.30 (H) 0.61 - 1.24 mg/dL   Calcium 8.9 8.9 - 10.3 mg/dL   Phosphorus 4.3 2.5 - 4.6 mg/dL   Albumin 3.2 (L) 3.5 -  5.0 g/dL   GFR, Estimated 31 (L) >60 mL/min    Comment: (NOTE) Calculated using the CKD-EPI Creatinine Equation (2021)    Anion gap 6 5 - 15    Comment: Performed at [facility], [address] [place], [place] [address]  Magnesium     Status: None   Collection Time: 11/02/21  4:52 AM  Result Value Ref Range   Magnesium 2.0 1.7 - 2.4 mg/dL    Comment: Performed at [facility], [address] [place], [place] [address]  Hepatic function panel     Status: Abnormal   Collection Time: 11/02/21  4:59 AM  Result Value Ref Range   Total Protein 6.9 6.5 - 8.1 g/dL   Albumin 3.2 (L) 3.5 - 5.0 g/dL   AST 25 15 - 41 U/L   ALT 18 0 - 44 U/L   Alkaline Phosphatase 78 38 - 126 U/L   Total Bilirubin 0.7 0.3 - 1.2 mg/dL   Bilirubin, Direct 0.1 0.0 - 0.2 mg/dL   Indirect Bilirubin 0.6 0.3 - 0.9 mg/dL    Comment: Performed at [facility], [address], [place], [place] [address]  Glucose, capillary     Status: Abnormal   Collection Time: 11/02/21  7:39 AM  Result Value Ref Range   Glucose-Capillary 125 (H) 70 - 99 mg/dL    Comment: Glucose reference range applies only to samples taken after fasting for at least 8 hours.  Protime-INR     Status: Abnormal   Collection Time: 11/02/21 10:19 AM  Result Value Ref Range   Prothrombin Time 15.8 (H) 11.4 - 15.2 seconds   INR 1.3 (H) 0.8 - 1.2    Comment: (NOTE) INR goal varies based on device and disease  states. Performed at [facility], [address], [place], [place] [address]   Glucose, capillary     Status: Abnormal   Collection Time: 11/02/21 11:15 AM  Result Value Ref Range   Glucose-Capillary 208 (H) 70 - 99 mg/dL    Comment: Glucose reference range applies only to samples taken after fasting for at least 8 hours.  Glucose, capillary     Status: None   Collection Time: 11/02/21  4:25 PM  Result Value Ref Range   Glucose-Capillary 92 70 - 99 mg/dL    Comment: Glucose reference range applies only to samples taken after fasting for at least 8 hours.  Basic metabolic panel     Status: Abnormal   Collection Time: 11/07/21  8:54 AM  Result Value Ref Range   Sodium 139 135 - 145 mmol/L   Potassium 4.2 3.5 - 5.1 mmol/L   Chloride 108 98 - 111 mmol/L   CO2 25 22 - 32 mmol/L   Glucose, Bld 69 (L) 70 - 99 mg/dL    Comment: Glucose reference range applies only to samples taken after fasting for at least 8 hours.   BUN 24 (H) 8 - 23 mg/dL   Creatinine, Ser 1.87 (H) 0.61 - 1.24 mg/dL   Calcium 9.4 8.9 - 10.3 mg/dL   GFR, Estimated 39 (L) >60 mL/min    Comment: (NOTE) Calculated using the CKD-EPI Creatinine Equation (2021)    Anion gap 6 5 - 15    Comment: Performed at [facility], [address] [place], [place] [address]  Hepatic function panel     Status: Abnormal   Collection Time: 11/07/21  8:54 AM  Result Value Ref Range   Total Protein 7.5 6.5 - 8.1 g/dL  Albumin 3.4 (L) 3.5 - 5.0 g/dL   AST 33 15 - 41 U/L   ALT 20 0 - 44 U/L   Alkaline Phosphatase 90 38 - 126 U/L   Total Bilirubin 0.7 0.3 - 1.2 mg/dL   Bilirubin, Direct 0.1 0.0 - 0.2 mg/dL   Indirect Bilirubin 0.6 0.3 - 0.9 mg/dL    Comment: Performed at [facility], [address], [place], [place] [address]  CBC with Differential/Platelet     Status: Abnormal   Collection Time: 11/07/21  8:54 AM  Result Value Ref Range   WBC 4.0 4.0 - 10.5 K/uL   RBC 3.02 (L) 4.22 - 5.81 MIL/uL   Hemoglobin 8.8 (L) 13.0 - 17.0  g/dL   HCT 27.7 (L) 39.0 - 52.0 %   MCV 91.7 80.0 - 100.0 fL   MCH 29.1 26.0 - 34.0 pg   MCHC 31.8 30.0 - 36.0 g/dL   RDW 16.9 (H) 11.5 - 15.5 %   Platelets 109 (L) 150 - 400 K/uL    Comment: Immature Platelet Fraction may be clinically indicated, consider ordering this additional test [id number]    nRBC 0.0 0.0 - 0.2 %   Neutrophils Relative % 57 %   Neutro Abs 2.2 1.7 - 7.7 K/uL   Lymphocytes Relative 26 %   Lymphs Abs 1.1 0.7 - 4.0 K/uL   Monocytes Relative 10 %   Monocytes Absolute 0.4 0.1 - 1.0 K/uL   Eosinophils Relative 6 %   Eosinophils Absolute 0.3 0.0 - 0.5 K/uL   Basophils Relative 1 %   Basophils Absolute 0.0 0.0 - 0.1 K/uL   WBC Morphology MORPHOLOGY UNREMARKABLE    RBC Morphology MORPHOLOGY UNREMARKABLE    Smear Review MORPHOLOGY UNREMARKABLE    Immature Granulocytes 0 %   Abs Immature Granulocytes 0.01 0.00 - 0.07 K/uL    Comment: Performed at [facility], [address], [place], [place] [address]  Ammonia     Status: Abnormal   Collection Time: 12/13/21  1:03 PM  Result Value Ref Range   Ammonia 133 (H) 9 - 35 umol/L    Comment: Performed at [facility], [address] [place], [place] [address]  CBC with Differential/Platelet     Status: Abnormal   Collection Time: 12/13/21  1:04 PM  Result Value Ref Range   WBC 3.2 (L) 4.0 - 10.5 K/uL   RBC 2.88 (L) 4.22 - 5.81 MIL/uL   Hemoglobin 8.6 (L) 13.0 - 17.0 g/dL   HCT 26.3 (L) 39.0 - 52.0 %   MCV 91.3 80.0 - 100.0 fL   MCH 29.9 26.0 - 34.0 pg   MCHC 32.7 30.0 - 36.0 g/dL   RDW 17.5 (H) 11.5 - 15.5 %   Platelets 93 (L) 150 - 400 K/uL    Comment: SPECIMEN CHECKED FOR CLOTS Immature Platelet Fraction may be clinically indicated, consider ordering this additional test [id number] REPEATED TO VERIFY PLATELET COUNT CONFIRMED BY SMEAR    nRBC 0.0 0.0 - 0.2 %   Neutrophils Relative % 59 %   Neutro Abs 1.9 1.7 - 7.7 K/uL   Lymphocytes Relative 24 %   Lymphs Abs 0.8 0.7 - 4.0 K/uL   Monocytes Relative 10 %    Monocytes Absolute 0.3 0.1 - 1.0 K/uL   Eosinophils Relative 6 %   Eosinophils Absolute 0.2 0.0 - 0.5 K/uL   Basophils Relative 1 %   Basophils Absolute 0.0 0.0 - 0.1 K/uL   WBC Morphology MORPHOLOGY UNREMARKABLE    RBC  Morphology MORPHOLOGY UNREMARKABLE    Smear Review PLATELET COUNT CONFIRMED BY SMEAR    Immature Granulocytes 0 %   Abs Immature Granulocytes 0.00 0.00 - 0.07 K/uL    Comment: Performed at [facility], [address], [place], [place] [address]  Lipase, blood     Status: None   Collection Time: 12/13/21  1:04 PM  Result Value Ref Range   Lipase 25 11 - 51 U/L    Comment: Performed at [facility], [address] [place], [place] [address]  Protime-INR     Status: Abnormal   Collection Time: 12/13/21  1:04 PM  Result Value Ref Range   Prothrombin Time 16.7 (H) 11.4 - 15.2 seconds   INR 1.4 (H) 0.8 - 1.2    Comment: (NOTE) INR goal varies based on device and disease states. Performed at [facility], [address], [place], [place] [address]   Hepatic function panel     Status: Abnormal   Collection Time: 12/13/21  1:04 PM  Result Value Ref Range   Total Protein 7.6 6.5 - 8.1 g/dL   Albumin 3.4 (L) 3.5 - 5.0 g/dL   AST 28 15 - 41 U/L   ALT 18 0 - 44 U/L   Alkaline Phosphatase 98 38 - 126 U/L   Total Bilirubin 1.0 0.3 - 1.2 mg/dL   Bilirubin, Direct 0.2 0.0 - 0.2 mg/dL   Indirect Bilirubin 0.8 0.3 - 0.9 mg/dL    Comment: Performed at [facility], [address] [place], [place] [address]  Basic metabolic panel     Status: Abnormal   Collection Time: 12/13/21  1:04 PM  Result Value Ref Range   Sodium 139 135 - 145 mmol/L   Potassium 3.9 3.5 - 5.1 mmol/L   Chloride 106 98 - 111 mmol/L   CO2 26 22 - 32 mmol/L   Glucose, Bld 175 (H) 70 - 99 mg/dL    Comment: Glucose reference range applies only to samples taken after fasting for at least 8 hours.   BUN 37 (H) 8 - 23 mg/dL   Creatinine, Ser 2.55 (H) 0.61 - 1.24 mg/dL   Calcium 9.2 8.9 - 10.3 mg/dL   GFR,  Estimated 27 (L) >60 mL/min    Comment: (NOTE) Calculated using the CKD-EPI Creatinine Equation (2021)    Anion gap 7 5 - 15    Comment: Performed at [facility], [address] [place], [place] [address]  Phosphorus     Status: None   Collection Time: 12/13/21  1:04 PM  Result Value Ref Range   Phosphorus 3.8 2.5 - 4.6 mg/dL    Comment: Performed at [facility], [address], [place], [place] [address]    RADIOGRAPHIC STUDIES: I have personally reviewed the radiological images as listed and agreed with the findings in the report. DG Chest 2 View  Result Date: 11/29/2021 CLINICAL DATA:  Acute renal failure. EXAM: CHEST - 2 VIEW COMPARISON:  Chest x-ray 10/30/2021. FINDINGS: Mediastinum and hilar structures normal. Cardiomegaly again noted. Mild pulmonary venous congestion cannot be completely excluded. Mild bilateral interstitial prominence again noted. A component of these changes may be chronic. An active interstitial process including interstitial edema and or pneumonitis cannot be completely excluded. No pleural effusion noted on today's exam. Mild biapical pleural thickening again noted consistent with scarring. Prior cervical spine fusion. Degenerative changes and scoliosis thoracic spine. IMPRESSION: Cardiomegaly again noted. Mild pulmonary venous congestion cannot be completely excluded. Mild bilateral interstitial prominence again noted. A component  these changes may be chronic. An active interstitial process including interstitial edema and or pneumonitis cannot be completely excluded. Interval resolution of previously identified pleural effusions. Electronically Signed   By: Marcello Moores  Register M.D.   On: 11/29/2021 11:35   US RENAL  Result Date: 11/23/2021 CLINICAL DATA:  Acute renal insufficiency EXAM: RENAL / URINARY TRACT ULTRASOUND COMPLETE COMPARISON:  None Available. FINDINGS: Right Kidney: Renal measurements: 11.6 x 5.7 x 6.6 cm = volume: 228 mL. Echogenicity within normal  limits. No mass or hydronephrosis visualized. Left Kidney: Renal measurements: 10.5 x 5.4 x 5.0 cm = volume: 148 mL. Echogenicity within normal limits. No mass or hydronephrosis visualized. Bladder: Appears normal for degree of bladder distention. Other: Right pleural effusion.  Trace perihepatic ascites. IMPRESSION: 1. The kidneys and bladder are unremarkable. 2. Right pleural effusion. 3. Trace perihepatic ascites. Electronically Signed   By: Dorise Bullion III M.D.   On: 11/23/2021 18:40   US AORTA  Result Date: 11/23/2021 CLINICAL DATA:  Follow-up for abdominal aortic aneurysm. EXAM: ULTRASOUND OF ABDOMINAL AORTA TECHNIQUE: Ultrasound examination of the abdominal aorta and proximal common iliac arteries was performed to evaluate for aneurysm. Additional color and Doppler images of the distal aorta were obtained to document patency. COMPARISON:  Ultrasound of abdomen December 07, 2020 FINDINGS: Abdominal aortic measurements as follows: Proximal:  2.6 AP/1.9 transverse cm Mid:  1.5 AP/1.5 transverse cm Distal:  1.9 AP/1.9 transverse cm Patent: Yes, peak systolic velocity is 740 cm/s Right common iliac artery: 1.1 AP/1 transverse cm Left common iliac artery: 0.8 AP/0.9 transverse cm IMPRESSION: No evidence of abdominal aortic aneurysm. No follow-up is recommended. This recommendation follows ACR consensus guidelines: White Paper of the ACR Incidental Findings Committee II on Vascular Findings. J Am Coll Radiol 2013; 10:789-794. Electronically Signed   By: Abelardo Diesel M.D.   On: 11/23/2021 09:22    ASSESSMENT & PLAN: 1.  Iron deficiency anemia -  Normocytic anemia since 2021, with significant worsening of his anemia in April 2023 (Hgb 5.6) during admission to hospital for GI bleeding.  Most recent CBC (12/13/2021) shows Hgb 8.6 with MCV 91.3. - Iron panel checked by nephrologist (11/15/2021) shows ferritin 56, iron saturation 14%, and TIBC 359. - Hospitalized in April 2023 for GI bleeding secondary to varices,  requiring esophageal variceal banding and PRBC transfusion x 3.  He was hospitalized again in May 2023 with volume overload and symptomatic anemia requiring repeat EGD (10/31/2021) which showed portal gastropathy and grade 1 varices - he received Venofer 300 mg during hospitalization in May. - Follows with [facility] Neil Crouch, PA-C and Dr. Gala Romney) for his alcohol-related liver cirrhosis and GI bleeding. - He has been taking oral iron supplementation x1 month.  He has not received any EPO injections. - Denies any recent hematemesis, melena, or hematochezia. - Symptomatic with fatigue, pica, restless legs - Differential diagnosis favors iron deficiency anemia secondary to GI blood loss, but he may also have some anemia secondary to his CKD stage IIIb - PLAN: Recommend IV iron supplementation with Venofer 300 mg x 3. - Patient can continue to take oral iron supplementation, recommended to take this at least 4 hours separate from his Protonix. - We will consider starting EPO/ESA if he has persistent anemia despite adequate iron repletion - We will check additional labs with nutritional panel, SPEP, immunofixation, reticulocytes, and LDH - Repeat CBC and iron panel with RTC in 2 months  2.  Thrombocytopenia and leukopenia - Intermittent mild leukopenia since April  2023.  Most recent WBC (12/13/2021) 3.2 with normal differential.  He has had mild thrombocytopenia since June 2022, with most recent platelets (12/13/2021) at 73.   - Nephrology testing for hepatitis B, hepatitis C, and HIV in June 2023 was negative - No history of autoimmune or connective tissue disorder. - Patient has liver cirrhosis and splenomegaly - Follows with [facility] Neil Crouch, PA-C and Dr. Gala Romney) for his alcohol-related liver cirrhosis and GI bleeding. - Most recent abdominal ultrasound (12/07/2020) showed splenomegaly measuring 14 cm with volume 678 cc. - Admits to easy  bruising, no petechial rash. - PLAN: We will check additional labs with nutritional panel, SPEP, immunofixation, reticulocytes, and LDH. - RTC in 2 months with repeat CBC. - No strong indication for treatment of leukopenia or thrombocytopenia at this time.  We will continue to monitor.  3.  Elevated free light chains - Labs obtained by nephrologist (11/15/2021) show elevated free serum light chains with elevated kappa 168.3, elevated lambda 92.6, and elevated ratio 1.82. - 24-hour urine (11/15/2021) showed elevated urine light chains with kappa 96.75 and lambda 9.02 with ratio 10.73.  Urine immunofixation showed polyclonal light chains but was negative for monoclonal immunoglobulin or Bence-Jones protein. - SPEP and serum immunofixation not yet checked - He denies any new bone pain or B symptoms.   - PLAN: We will complete work-up of plasma cell dyscrasias with SPEP and immunofixation.  4.  Other history - PMH: CKD stage 3b, type 2 diabetes mellitus, anemia of chronic disease, hypertension, alcoholic liver cirrhosis, history of GI bleed from esophageal varices, restless legs, sleep apnea, and chronic diastolic CHF. 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 - SUBSTANCE: History of heavy alcohol use for 20 years, but has been sober since October 2022.  He smoked 0.5 PPD cigarettes for 30 years, but quit 10+ years ago.  He has history of drug use many years ago, but denies any IV drug use. - FAMILY: No family history of blood abnormalities or cancer.   PLAN SUMMARY & DISPOSITION: Labs today Venofer 300 mg x 3 Same-day labs (CBC, CMP, ferritin, iron/TIBC) and office visit in 2 months  All questions were answered. The patient knows to call the clinic with any problems, questions or concerns.   Medical decision making: Moderate  Time spent on visit: I spent 30 minutes counseling the patient face to face. The  total time spent in the appointment was 55 minutes and more than 50% was on counseling.  I, Tarri Abernethy PA-C, have seen this patient in conjunction with Dr. Derek Jack.  Greater than 50% of visit was performed by Dr. Delton Coombes.   Harriett Rush, PA-C 12/20/2021 4:20 PM  DR. Jacoby Zanni: I have independently evaluated this patient and formulated my assessment and plan.  I agree with H&P, assessment and plan written by Casey Burkitt, PA-C.  Patient with history of cirrhosis and splenomegaly and mild leukopenia and mild to moderate thrombocytopenia.  He has normocytic anemia from CKD and iron deficiency.  He was recently given parenteral iron therapy.  I have recommended 1 g of parenteral iron therapy and reevaluate in 2 months.  We will also check for other nutritional deficiencies.

## 2021-12-20 ENCOUNTER — Inpatient Hospital Stay (HOSPITAL_COMMUNITY): Payer: Medicare HMO | Attending: Hematology | Admitting: Hematology

## 2021-12-20 ENCOUNTER — Encounter (HOSPITAL_COMMUNITY): Payer: Self-pay | Admitting: Hematology

## 2021-12-20 ENCOUNTER — Inpatient Hospital Stay (HOSPITAL_COMMUNITY): Payer: Medicare HMO

## 2021-12-20 VITALS — BP 130/76 | HR 70 | Temp 98.0°F | Resp 16 | Ht 66.0 in | Wt 192.2 lb

## 2021-12-20 DIAGNOSIS — D509 Iron deficiency anemia, unspecified: Secondary | ICD-10-CM | POA: Insufficient documentation

## 2021-12-20 DIAGNOSIS — R161 Splenomegaly, not elsewhere classified: Secondary | ICD-10-CM | POA: Insufficient documentation

## 2021-12-20 DIAGNOSIS — D500 Iron deficiency anemia secondary to blood loss (chronic): Secondary | ICD-10-CM

## 2021-12-20 DIAGNOSIS — I5032 Chronic diastolic (congestive) heart failure: Secondary | ICD-10-CM | POA: Insufficient documentation

## 2021-12-20 DIAGNOSIS — I130 Hypertensive heart and chronic kidney disease with heart failure and stage 1 through stage 4 chronic kidney disease, or unspecified chronic kidney disease: Secondary | ICD-10-CM | POA: Diagnosis not present

## 2021-12-20 DIAGNOSIS — E1122 Type 2 diabetes mellitus with diabetic chronic kidney disease: Secondary | ICD-10-CM | POA: Diagnosis not present

## 2021-12-20 DIAGNOSIS — D708 Other neutropenia: Secondary | ICD-10-CM

## 2021-12-20 DIAGNOSIS — K7030 Alcoholic cirrhosis of liver without ascites: Secondary | ICD-10-CM | POA: Diagnosis not present

## 2021-12-20 DIAGNOSIS — D61818 Other pancytopenia: Secondary | ICD-10-CM | POA: Diagnosis not present

## 2021-12-20 DIAGNOSIS — D696 Thrombocytopenia, unspecified: Secondary | ICD-10-CM | POA: Insufficient documentation

## 2021-12-20 DIAGNOSIS — N1832 Chronic kidney disease, stage 3b: Secondary | ICD-10-CM | POA: Diagnosis not present

## 2021-12-20 DIAGNOSIS — R69 Illness, unspecified: Secondary | ICD-10-CM | POA: Diagnosis not present

## 2021-12-20 DIAGNOSIS — D72819 Decreased white blood cell count, unspecified: Secondary | ICD-10-CM | POA: Insufficient documentation

## 2021-12-20 DIAGNOSIS — D631 Anemia in chronic kidney disease: Secondary | ICD-10-CM | POA: Diagnosis not present

## 2021-12-20 DIAGNOSIS — Z87891 Personal history of nicotine dependence: Secondary | ICD-10-CM | POA: Insufficient documentation

## 2021-12-20 HISTORY — DX: Iron deficiency anemia secondary to blood loss (chronic): D50.0

## 2021-12-20 LAB — CBC WITH DIFFERENTIAL/PLATELET
Abs Immature Granulocytes: 0.01 10*3/uL (ref 0.00–0.07)
Basophils Absolute: 0 10*3/uL (ref 0.0–0.1)
Basophils Relative: 1 %
Eosinophils Absolute: 0.2 10*3/uL (ref 0.0–0.5)
Eosinophils Relative: 5 %
HCT: 28.5 % — ABNORMAL LOW (ref 39.0–52.0)
Hemoglobin: 9.5 g/dL — ABNORMAL LOW (ref 13.0–17.0)
Immature Granulocytes: 0 %
Lymphocytes Relative: 21 %
Lymphs Abs: 0.9 10*3/uL (ref 0.7–4.0)
MCH: 30.4 pg (ref 26.0–34.0)
MCHC: 33.3 g/dL (ref 30.0–36.0)
MCV: 91.1 fL (ref 80.0–100.0)
Monocytes Absolute: 0.5 10*3/uL (ref 0.1–1.0)
Monocytes Relative: 11 %
Neutro Abs: 2.5 10*3/uL (ref 1.7–7.7)
Neutrophils Relative %: 62 %
Platelets: 105 10*3/uL — ABNORMAL LOW (ref 150–400)
RBC: 3.13 MIL/uL — ABNORMAL LOW (ref 4.22–5.81)
RDW: 17.1 % — ABNORMAL HIGH (ref 11.5–15.5)
WBC: 4.1 10*3/uL (ref 4.0–10.5)
nRBC: 0 % (ref 0.0–0.2)

## 2021-12-20 LAB — COMPREHENSIVE METABOLIC PANEL
ALT: 23 U/L (ref 0–44)
AST: 30 U/L (ref 15–41)
Albumin: 3.3 g/dL — ABNORMAL LOW (ref 3.5–5.0)
Alkaline Phosphatase: 124 U/L (ref 38–126)
Anion gap: 7 (ref 5–15)
BUN: 32 mg/dL — ABNORMAL HIGH (ref 8–23)
CO2: 26 mmol/L (ref 22–32)
Calcium: 9.3 mg/dL (ref 8.9–10.3)
Chloride: 101 mmol/L (ref 98–111)
Creatinine, Ser: 2.32 mg/dL — ABNORMAL HIGH (ref 0.61–1.24)
GFR, Estimated: 30 mL/min — ABNORMAL LOW (ref >60)
Glucose, Bld: 278 mg/dL — ABNORMAL HIGH (ref 70–99)
Potassium: 3.9 mmol/L (ref 3.5–5.1)
Sodium: 134 mmol/L — ABNORMAL LOW (ref 135–145)
Total Bilirubin: 1 mg/dL (ref 0.3–1.2)
Total Protein: 7.4 g/dL (ref 6.5–8.1)

## 2021-12-20 LAB — FOLATE: Folate: >40 ng/mL (ref >5.9)

## 2021-12-20 LAB — LACTATE DEHYDROGENASE: LDH: 127 U/L (ref 98–192)

## 2021-12-20 LAB — RETICULOCYTES
Immature Retic Fract: 8.9 % (ref 2.3–15.9)
RBC.: 3.06 MIL/uL — ABNORMAL LOW (ref 4.22–5.81)
Retic Count, Absolute: 45.3 10*3/uL (ref 19.0–186.0)
Retic Ct Pct: 1.5 % (ref 0.4–3.1)

## 2021-12-20 LAB — VITAMIN B12: Vitamin B-12: 781 pg/mL (ref 180–914)

## 2021-12-20 LAB — IRON AND TIBC
Iron: 75 ug/dL (ref 45–182)
Saturation Ratios: 21 % (ref 17.9–39.5)
TIBC: 364 ug/dL (ref 250–450)
UIBC: 289 ug/dL

## 2021-12-20 LAB — FERRITIN: Ferritin: 31 ng/mL (ref 24–336)

## 2021-12-20 NOTE — Patient Instructions (Signed)
[place] at [facility] Discharge Instructions  You were seen today by Dr. Delton Coombes and Tarri Abernethy PA-C for your low blood counts.  LOW PLATELETS & LOW WHITE BLOOD CELLS: As we discussed, this is most likely related to your liver cirrhosis and enlarged spleen.  Having an enlarged spleen (which is caused by your liver cirrhosis) causes your platelets and white blood cells to be "sucked up" by the spleen instead of circulating in your regular blood. We will check additional labs to make sure that you do not have any other vitamin or mineral deficiencies that could be causing low platelets and low white blood cells. Otherwise, we will continue to monitor these levels periodically.  You do not need any treatment for your low platelets or low white blood cells at this level.  They are only mildly low, and are not posing a significant danger to you at this time.  ANEMIA: Your anemia is most likely related to your recent GI bleeding from your esophageal varices.  This has caused your iron levels to be low.  Since iron is the main "building block" of blood cells, your body does not have enough "building materials" to make more blood cells. We will treat you with IV iron x3 doses to improve your iron and help your body to make more blood cells. You can continue to take your iron tablet once daily, but this should be taken at lunchtime, so that it is separated from your Protonix by at least 4 hours. We will recheck your blood counts and iron levels in 2 months.  If your blood levels are still low despite having enough iron in your body, we will consider starting you on injections (Retacrit shots) which help to treat anemia that related to chronic kidney disease.  ABNORMAL PROTEINS: The labs checked by Dr. Theador Hawthorne show mild abnormal protein levels (elevated free light chains).  This is most likely related to chronic kidney disease.  We will check additional labs today to make  sure there is no abnormal precancerous protein associated with these levels.  LABS: Check labs today on the first floor of the hospital.  FOLLOW-UP APPOINTMENT: Same-day labs and office visit in 2 months.   - - - - - - - - - - - - - - - - - -    Thank you for choosing [place] at [facility] to provide your oncology and hematology care.  To afford each patient quality time with our provider, please arrive at least 15 minutes before your scheduled appointment time.   If you have a lab appointment with the [facility] please come in thru the Main Entrance and check in at the main information desk.  You need to re-schedule your appointment should you arrive 10 or more minutes late.  We strive to give you quality time with our providers, and arriving late affects you and other patients whose appointments are after yours.  Also, if you no show three or more times for appointments you may be dismissed from the clinic at the providers discretion.     Again, thank you for choosing [facility].  Our hope is that these requests will decrease the amount of time that you wait before being seen by our physicians.       _____________________________________________________________  Should you have questions after your visit to [facility], please contact our office at [phone] and follow the prompts.  Our office hours are 8:00 a.m. and 4:30 p.m. Monday - Friday.  Please note that voicemails left after 4:00 p.m. may not be returned until the following business day.  We are closed weekends and major holidays.  You do have access to a nurse 24-7, just call the main number to the clinic [phone] and do not press any options, hold on the line and a nurse will answer the phone.    For prescription refill requests, have your pharmacy contact our office and allow 72 hours.    Due to Covid, you will need to wear a mask upon entering the hospital. If you do  not have a mask, a mask will be given to you at the Main Entrance upon arrival. For doctor visits, patients may have 1 support person age 65 or older with them. For treatment visits, patients can not have anyone with them due to social distancing guidelines and our immunocompromised population.

## 2021-12-21 DIAGNOSIS — K7030 Alcoholic cirrhosis of liver without ascites: Secondary | ICD-10-CM | POA: Diagnosis not present

## 2021-12-21 DIAGNOSIS — I129 Hypertensive chronic kidney disease with stage 1 through stage 4 chronic kidney disease, or unspecified chronic kidney disease: Secondary | ICD-10-CM | POA: Diagnosis not present

## 2021-12-21 DIAGNOSIS — D6959 Other secondary thrombocytopenia: Secondary | ICD-10-CM | POA: Diagnosis not present

## 2021-12-21 DIAGNOSIS — R601 Generalized edema: Secondary | ICD-10-CM | POA: Diagnosis not present

## 2021-12-21 DIAGNOSIS — K7460 Unspecified cirrhosis of liver: Secondary | ICD-10-CM | POA: Diagnosis not present

## 2021-12-21 DIAGNOSIS — E1129 Type 2 diabetes mellitus with other diabetic kidney complication: Secondary | ICD-10-CM | POA: Diagnosis not present

## 2021-12-21 DIAGNOSIS — E7220 Disorder of urea cycle metabolism, unspecified: Secondary | ICD-10-CM | POA: Diagnosis not present

## 2021-12-21 DIAGNOSIS — N170 Acute kidney failure with tubular necrosis: Secondary | ICD-10-CM | POA: Diagnosis not present

## 2021-12-21 DIAGNOSIS — R809 Proteinuria, unspecified: Secondary | ICD-10-CM | POA: Diagnosis not present

## 2021-12-21 DIAGNOSIS — D638 Anemia in other chronic diseases classified elsewhere: Secondary | ICD-10-CM | POA: Diagnosis not present

## 2021-12-21 DIAGNOSIS — N189 Chronic kidney disease, unspecified: Secondary | ICD-10-CM | POA: Diagnosis not present

## 2021-12-21 DIAGNOSIS — K7682 Hepatic encephalopathy: Secondary | ICD-10-CM | POA: Diagnosis not present

## 2021-12-21 DIAGNOSIS — E1122 Type 2 diabetes mellitus with diabetic chronic kidney disease: Secondary | ICD-10-CM | POA: Diagnosis not present

## 2021-12-21 DIAGNOSIS — R69 Illness, unspecified: Secondary | ICD-10-CM | POA: Diagnosis not present

## 2021-12-21 LAB — KAPPA/LAMBDA LIGHT CHAINS
Kappa free light chain: 138 mg/L — ABNORMAL HIGH (ref 3.3–19.4)
Kappa, lambda light chain ratio: 1.76 — ABNORMAL HIGH (ref 0.26–1.65)
Lambda free light chains: 78.5 mg/L — ABNORMAL HIGH (ref 5.7–26.3)

## 2021-12-23 LAB — COPPER, SERUM: Copper: 118 ug/dL (ref 69–132)

## 2021-12-23 LAB — METHYLMALONIC ACID, SERUM: Methylmalonic Acid, Quantitative: 411 nmol/L — ABNORMAL HIGH (ref 0–378)

## 2021-12-25 ENCOUNTER — Encounter (HOSPITAL_COMMUNITY): Payer: Self-pay

## 2021-12-25 ENCOUNTER — Inpatient Hospital Stay (HOSPITAL_COMMUNITY): Payer: Medicare HMO

## 2021-12-25 VITALS — BP 128/59 | HR 66 | Temp 96.2°F | Resp 18

## 2021-12-25 DIAGNOSIS — Z87891 Personal history of nicotine dependence: Secondary | ICD-10-CM | POA: Diagnosis not present

## 2021-12-25 DIAGNOSIS — I130 Hypertensive heart and chronic kidney disease with heart failure and stage 1 through stage 4 chronic kidney disease, or unspecified chronic kidney disease: Secondary | ICD-10-CM | POA: Diagnosis not present

## 2021-12-25 DIAGNOSIS — R69 Illness, unspecified: Secondary | ICD-10-CM | POA: Diagnosis not present

## 2021-12-25 DIAGNOSIS — D696 Thrombocytopenia, unspecified: Secondary | ICD-10-CM | POA: Diagnosis not present

## 2021-12-25 DIAGNOSIS — D509 Iron deficiency anemia, unspecified: Secondary | ICD-10-CM | POA: Diagnosis not present

## 2021-12-25 DIAGNOSIS — D61818 Other pancytopenia: Secondary | ICD-10-CM | POA: Diagnosis not present

## 2021-12-25 DIAGNOSIS — I5032 Chronic diastolic (congestive) heart failure: Secondary | ICD-10-CM | POA: Diagnosis not present

## 2021-12-25 DIAGNOSIS — N1832 Chronic kidney disease, stage 3b: Secondary | ICD-10-CM | POA: Diagnosis not present

## 2021-12-25 DIAGNOSIS — D500 Iron deficiency anemia secondary to blood loss (chronic): Secondary | ICD-10-CM

## 2021-12-25 DIAGNOSIS — D631 Anemia in chronic kidney disease: Secondary | ICD-10-CM | POA: Diagnosis not present

## 2021-12-25 DIAGNOSIS — R161 Splenomegaly, not elsewhere classified: Secondary | ICD-10-CM | POA: Diagnosis not present

## 2021-12-25 DIAGNOSIS — D72819 Decreased white blood cell count, unspecified: Secondary | ICD-10-CM | POA: Diagnosis not present

## 2021-12-25 DIAGNOSIS — E1122 Type 2 diabetes mellitus with diabetic chronic kidney disease: Secondary | ICD-10-CM | POA: Diagnosis not present

## 2021-12-25 LAB — COMPREHENSIVE METABOLIC PANEL
AG Ratio: 1 (calc) (ref 1.0–2.5)
ALT: 18 U/L (ref 9–46)
AST: 28 U/L (ref 10–35)
Albumin: 3.4 g/dL — ABNORMAL LOW (ref 3.6–5.1)
Alkaline phosphatase (APISO): 135 U/L (ref 35–144)
BUN/Creatinine Ratio: 14 (calc) (ref 6–22)
BUN: 31 mg/dL — ABNORMAL HIGH (ref 7–25)
CO2: 24 mmol/L (ref 20–32)
Calcium: 9.2 mg/dL (ref 8.6–10.3)
Chloride: 100 mmol/L (ref 98–110)
Creat: 2.19 mg/dL — ABNORMAL HIGH (ref 0.70–1.35)
Globulin: 3.5 g/dL (calc) (ref 1.9–3.7)
Glucose, Bld: 155 mg/dL — ABNORMAL HIGH (ref 65–99)
Potassium: 4.2 mmol/L (ref 3.5–5.3)
Sodium: 136 mmol/L (ref 135–146)
Total Bilirubin: 0.7 mg/dL (ref 0.2–1.2)
Total Protein: 6.9 g/dL (ref 6.1–8.1)

## 2021-12-25 LAB — IMMUNOFIXATION ELECTROPHORESIS
IgA: 486 mg/dL — ABNORMAL HIGH (ref 61–437)
IgG (Immunoglobin G), Serum: 1850 mg/dL — ABNORMAL HIGH (ref 603–1613)
IgM (Immunoglobulin M), Srm: 190 mg/dL — ABNORMAL HIGH (ref 20–172)
Total Protein ELP: 7.3 g/dL (ref 6.0–8.5)

## 2021-12-25 LAB — AFP TUMOR MARKER: AFP-Tumor Marker: 0.9 ng/mL (ref <6.1)

## 2021-12-25 LAB — PROTEIN ELECTROPHORESIS, SERUM
A/G Ratio: 0.8 (ref 0.7–1.7)
Albumin ELP: 3.2 g/dL (ref 2.9–4.4)
Alpha-1-Globulin: 0.3 g/dL (ref 0.0–0.4)
Alpha-2-Globulin: 0.6 g/dL (ref 0.4–1.0)
Beta Globulin: 1 g/dL (ref 0.7–1.3)
Gamma Globulin: 2 g/dL — ABNORMAL HIGH (ref 0.4–1.8)
Globulin, Total: 3.9 g/dL (ref 2.2–3.9)
Total Protein ELP: 7.1 g/dL (ref 6.0–8.5)

## 2021-12-25 LAB — PROTIME-INR
INR: 1.1
Prothrombin Time: 11.9 s — ABNORMAL HIGH (ref 9.0–11.5)

## 2021-12-25 MED ORDER — LORATADINE 10 MG PO TABS
10.0000 mg | ORAL_TABLET | Freq: Once | ORAL | Status: AC
  Administered 2021-12-25: 10 mg via ORAL
  Filled 2021-12-25: qty 1

## 2021-12-25 MED ORDER — LACTULOSE 10 GM/15ML PO SOLN: 20.0000 g | Freq: Three times a day (TID) | ORAL | 5 refills | Status: DC

## 2021-12-25 MED ORDER — SODIUM CHLORIDE 0.9 % IV SOLN
300.0000 mg | Freq: Once | INTRAVENOUS | Status: AC
  Administered 2021-12-25: 300 mg via INTRAVENOUS
  Filled 2021-12-25: qty 300

## 2021-12-25 MED ORDER — SODIUM CHLORIDE 0.9 % IV SOLN: Freq: Once | INTRAVENOUS | Status: AC

## 2021-12-25 NOTE — Progress Notes (Signed)
Patient tolerated iron infusion with no complaints voiced.  Peripheral IV site clean and dry with good blood return noted before and after infusion.  Band aid applied.  VSS with discharge and left in satisfactory condition with no s/s of distress noted.   

## 2021-12-25 NOTE — Patient Instructions (Signed)
[facility]  Discharge Instructions: Thank you for choosing [facility] to provide your oncology and hematology care.  If you have a lab appointment with the [facility], please come in thru the Main Entrance and check in at the main information desk.  Wear comfortable clothing and clothing appropriate for easy access to any Portacath or PICC line.   We strive to give you quality time with your provider. You may need to reschedule your appointment if you arrive late (15 or more minutes).  Arriving late affects you and other patients whose appointments are after yours.  Also, if you miss three or more appointments without notifying the office, you may be dismissed from the clinic at the provider's discretion.      For prescription refill requests, have your pharmacy contact our office and allow 72 hours for refills to be completed.    Today you received the following Venofer, return as scheduled.  Iron Sucrose Injection What is this medication? IRON SUCROSE (EYE ern SOO krose) treats low levels of iron (iron deficiency anemia) in people with kidney disease. Iron is a mineral that plays an important role in making red blood cells, which carry oxygen from your lungs to the rest of your body. This medicine may be used for other purposes; ask your health care provider or pharmacist if you have questions. COMMON BRAND NAME(S): Venofer What should I tell my care team before I take this medication? They need to know if you have any of these conditions: Anemia not caused by low iron levels Heart disease High levels of iron in the blood Kidney disease Liver disease An unusual or allergic reaction to iron, other medications, foods, dyes, or preservatives Pregnant or trying to get pregnant Breast-feeding How should I use this medication? This medication is for infusion into a vein. It is given in a hospital or clinic setting. Talk to your care team about the use of this  medication in children. While this medication may be prescribed for children as young as 2 years for selected conditions, precautions do apply. Overdosage: If you think you have taken too much of this medicine contact a poison control center or emergency room at once. NOTE: This medicine is only for you. Do not share this medicine with others. What if I miss a dose? It is important not to miss your dose. Call your care team if you are unable to keep an appointment. What may interact with this medication? Do not take this medication with any of the following: Deferoxamine Dimercaprol Other iron products This medication may also interact with the following: Chloramphenicol Deferasirox This list may not describe all possible interactions. Give your health care provider a list of all the medicines, herbs, non-prescription drugs, or dietary supplements you use. Also tell them if you smoke, drink alcohol, or use illegal drugs. Some items may interact with your medicine. What should I watch for while using this medication? Visit your care team regularly. Tell your care team if your symptoms do not start to get better or if they get worse. You may need blood work done while you are taking this medication. You may need to follow a special diet. Talk to your care team. Foods that contain iron include: whole grains/cereals, dried fruits, beans, or peas, leafy green vegetables, and organ meats (liver, kidney). What side effects may I notice from receiving this medication? Side effects that you should report to your care team as soon as possible: Allergic  reactions--skin rash, itching, hives, swelling of the face, lips, tongue, or throat Low blood pressure--dizziness, feeling faint or lightheaded, blurry vision Shortness of breath Side effects that usually do not require medical attention (report to your care team if they continue or are bothersome): Flushing Headache Joint pain Muscle  pain Nausea Pain, redness, or irritation at injection site This list may not describe all possible side effects. Call your doctor for medical advice about side effects. You may report side effects to FDA at 1-800-FDA-1088. Where should I keep my medication? This medication is given in a hospital or clinic and will not be stored at home. NOTE: This sheet is a summary. It may not cover all possible information. If you have questions about this medicine, talk to your doctor, pharmacist, or health care provider.  2023 Elsevier/Gold Standard (2020-10-21 00:00:00)    To help prevent nausea and vomiting after your treatment, we encourage you to take your nausea medication as directed.  BELOW ARE SYMPTOMS THAT SHOULD BE REPORTED IMMEDIATELY: *FEVER GREATER THAN 100.4 F (38 C) OR HIGHER *CHILLS OR SWEATING *NAUSEA AND VOMITING THAT IS NOT CONTROLLED WITH YOUR NAUSEA MEDICATION *UNUSUAL SHORTNESS OF BREATH *UNUSUAL BRUISING OR BLEEDING *URINARY PROBLEMS (pain or burning when urinating, or frequent urination) *BOWEL PROBLEMS (unusual diarrhea, constipation, pain near the anus) TENDERNESS IN MOUTH AND THROAT WITH OR WITHOUT PRESENCE OF ULCERS (sore throat, sores in mouth, or a toothache) UNUSUAL RASH, SWELLING OR PAIN  UNUSUAL VAGINAL DISCHARGE OR ITCHING   Items with * indicate a potential emergency and should be followed up as soon as possible or go to the Emergency Department if any problems should occur.  Please show the CHEMOTHERAPY ALERT CARD or IMMUNOTHERAPY ALERT CARD at check-in to the Emergency Department and triage nurse.  Should you have questions after your visit or need to cancel or reschedule your appointment, please contact [facility] [phone]  and follow the prompts.  Office hours are 8:00 a.m. to 4:30 p.m. Monday - Friday. Please note that voicemails left after 4:00 p.m. may not be returned until the following business day.  We are closed weekends and major  holidays. You have access to a nurse at all times for urgent questions. Please call the main number to the clinic [phone] and follow the prompts.  For any non-urgent questions, you may also contact your provider using MyChart. We now offer e-Visits for anyone 72 and older to request care online for non-urgent symptoms. For details visit [email or web address].   Also download the MyChart app! Go to the app store, search "MyChart", open the app, select [facility], and log in with your MyChart username and password.  Masks are optional in the cancer centers. If you would like for your care team to wear a mask while they are taking care of you, please let them know. For doctor visits, patients may have with them one support person who is at least 65 years old. At this time, visitors are not allowed in the infusion area.

## 2021-12-28 ENCOUNTER — Other Ambulatory Visit: Payer: Self-pay | Admitting: *Deleted

## 2021-12-28 DIAGNOSIS — K7460 Unspecified cirrhosis of liver: Secondary | ICD-10-CM

## 2022-01-01 ENCOUNTER — Encounter (HOSPITAL_COMMUNITY): Payer: Self-pay

## 2022-01-01 ENCOUNTER — Inpatient Hospital Stay (HOSPITAL_COMMUNITY): Payer: Medicare HMO

## 2022-01-01 VITALS — BP 126/66 | HR 66 | Temp 97.7°F | Resp 18

## 2022-01-01 DIAGNOSIS — R161 Splenomegaly, not elsewhere classified: Secondary | ICD-10-CM | POA: Diagnosis not present

## 2022-01-01 DIAGNOSIS — R69 Illness, unspecified: Secondary | ICD-10-CM | POA: Diagnosis not present

## 2022-01-01 DIAGNOSIS — E1122 Type 2 diabetes mellitus with diabetic chronic kidney disease: Secondary | ICD-10-CM | POA: Diagnosis not present

## 2022-01-01 DIAGNOSIS — D72819 Decreased white blood cell count, unspecified: Secondary | ICD-10-CM | POA: Diagnosis not present

## 2022-01-01 DIAGNOSIS — D696 Thrombocytopenia, unspecified: Secondary | ICD-10-CM | POA: Diagnosis not present

## 2022-01-01 DIAGNOSIS — Z87891 Personal history of nicotine dependence: Secondary | ICD-10-CM | POA: Diagnosis not present

## 2022-01-01 DIAGNOSIS — D61818 Other pancytopenia: Secondary | ICD-10-CM | POA: Diagnosis not present

## 2022-01-01 DIAGNOSIS — I5032 Chronic diastolic (congestive) heart failure: Secondary | ICD-10-CM | POA: Diagnosis not present

## 2022-01-01 DIAGNOSIS — D509 Iron deficiency anemia, unspecified: Secondary | ICD-10-CM | POA: Diagnosis not present

## 2022-01-01 DIAGNOSIS — D500 Iron deficiency anemia secondary to blood loss (chronic): Secondary | ICD-10-CM

## 2022-01-01 DIAGNOSIS — D631 Anemia in chronic kidney disease: Secondary | ICD-10-CM | POA: Diagnosis not present

## 2022-01-01 DIAGNOSIS — N1832 Chronic kidney disease, stage 3b: Secondary | ICD-10-CM | POA: Diagnosis not present

## 2022-01-01 DIAGNOSIS — I130 Hypertensive heart and chronic kidney disease with heart failure and stage 1 through stage 4 chronic kidney disease, or unspecified chronic kidney disease: Secondary | ICD-10-CM | POA: Diagnosis not present

## 2022-01-01 MED ORDER — SODIUM CHLORIDE 0.9 % IV SOLN: Freq: Once | INTRAVENOUS | Status: AC

## 2022-01-01 MED ORDER — SODIUM CHLORIDE 0.9 % IV SOLN
300.0000 mg | Freq: Once | INTRAVENOUS | Status: AC
  Administered 2022-01-01: 300 mg via INTRAVENOUS
  Filled 2022-01-01: qty 300

## 2022-01-01 NOTE — Progress Notes (Signed)
Patient presents today for Venofer infusion, patient reports taking Zyrtec this morning before coming to clinic, pharmacy aware. Patient tolerated iron infusion with no complaints voiced. Peripheral IV site clean and dry with good blood return noted before and after infusion. Band aid applied. VSS with discharge and left in satisfactory condition with no s/s of distress noted.

## 2022-01-01 NOTE — Patient Instructions (Signed)
[facility]  Discharge Instructions: Thank you for choosing [facility] to provide your oncology and hematology care.  If you have a lab appointment with the [facility], please come in thru the Main Entrance and check in at the main information desk.  Wear comfortable clothing and clothing appropriate for easy access to any Portacath or PICC line.   We strive to give you quality time with your provider. You may need to reschedule your appointment if you arrive late (15 or more minutes).  Arriving late affects you and other patients whose appointments are after yours.  Also, if you miss three or more appointments without notifying the office, you may be dismissed from the clinic at the provider's discretion.      For prescription refill requests, have your pharmacy contact our office and allow 72 hours for refills to be completed.    Today you received the following Venofer, return as scheduled. Iron Sucrose Injection What is this medication? IRON SUCROSE (EYE ern SOO krose) treats low levels of iron (iron deficiency anemia) in people with kidney disease. Iron is a mineral that plays an important role in making red blood cells, which carry oxygen from your lungs to the rest of your body. This medicine may be used for other purposes; ask your health care provider or pharmacist if you have questions. COMMON BRAND NAME(S): Venofer What should I tell my care team before I take this medication? They need to know if you have any of these conditions: Anemia not caused by low iron levels Heart disease High levels of iron in the blood Kidney disease Liver disease An unusual or allergic reaction to iron, other medications, foods, dyes, or preservatives Pregnant or trying to get pregnant Breast-feeding How should I use this medication? This medication is for infusion into a vein. It is given in a hospital or clinic setting. Talk to your care team about the use of this  medication in children. While this medication may be prescribed for children as young as 2 years for selected conditions, precautions do apply. Overdosage: If you think you have taken too much of this medicine contact a poison control center or emergency room at once. NOTE: This medicine is only for you. Do not share this medicine with others. What if I miss a dose? It is important not to miss your dose. Call your care team if you are unable to keep an appointment. What may interact with this medication? Do not take this medication with any of the following: Deferoxamine Dimercaprol Other iron products This medication may also interact with the following: Chloramphenicol Deferasirox This list may not describe all possible interactions. Give your health care provider a list of all the medicines, herbs, non-prescription drugs, or dietary supplements you use. Also tell them if you smoke, drink alcohol, or use illegal drugs. Some items may interact with your medicine. What should I watch for while using this medication? Visit your care team regularly. Tell your care team if your symptoms do not start to get better or if they get worse. You may need blood work done while you are taking this medication. You may need to follow a special diet. Talk to your care team. Foods that contain iron include: whole grains/cereals, dried fruits, beans, or peas, leafy green vegetables, and organ meats (liver, kidney). What side effects may I notice from receiving this medication? Side effects that you should report to your care team as soon as possible: Allergic reactions--skin  rash, itching, hives, swelling of the face, lips, tongue, or throat Low blood pressure--dizziness, feeling faint or lightheaded, blurry vision Shortness of breath Side effects that usually do not require medical attention (report to your care team if they continue or are bothersome): Flushing Headache Joint pain Muscle  pain Nausea Pain, redness, or irritation at injection site This list may not describe all possible side effects. Call your doctor for medical advice about side effects. You may report side effects to FDA at 1-800-FDA-1088. Where should I keep my medication? This medication is given in a hospital or clinic and will not be stored at home. NOTE: This sheet is a summary. It may not cover all possible information. If you have questions about this medicine, talk to your doctor, pharmacist, or health care provider.  2023 Elsevier/Gold Standard (2020-10-21 00:00:00)     To help prevent nausea and vomiting after your treatment, we encourage you to take your nausea medication as directed.  BELOW ARE SYMPTOMS THAT SHOULD BE REPORTED IMMEDIATELY: *FEVER GREATER THAN 100.4 F (38 C) OR HIGHER *CHILLS OR SWEATING *NAUSEA AND VOMITING THAT IS NOT CONTROLLED WITH YOUR NAUSEA MEDICATION *UNUSUAL SHORTNESS OF BREATH *UNUSUAL BRUISING OR BLEEDING *URINARY PROBLEMS (pain or burning when urinating, or frequent urination) *BOWEL PROBLEMS (unusual diarrhea, constipation, pain near the anus) TENDERNESS IN MOUTH AND THROAT WITH OR WITHOUT PRESENCE OF ULCERS (sore throat, sores in mouth, or a toothache) UNUSUAL RASH, SWELLING OR PAIN  UNUSUAL VAGINAL DISCHARGE OR ITCHING   Items with * indicate a potential emergency and should be followed up as soon as possible or go to the Emergency Department if any problems should occur.  Please show the CHEMOTHERAPY ALERT CARD or IMMUNOTHERAPY ALERT CARD at check-in to the Emergency Department and triage nurse.  Should you have questions after your visit or need to cancel or reschedule your appointment, please contact [facility] [phone]  and follow the prompts.  Office hours are 8:00 a.m. to 4:30 p.m. Monday - Friday. Please note that voicemails left after 4:00 p.m. may not be returned until the following business day.  We are closed weekends and major  holidays. You have access to a nurse at all times for urgent questions. Please call the main number to the clinic [phone] and follow the prompts.  For any non-urgent questions, you may also contact your provider using MyChart. We now offer e-Visits for anyone 74 and older to request care online for non-urgent symptoms. For details visit [email or web address].   Also download the MyChart app! Go to the app store, search "MyChart", open the app, select [facility], and log in with your MyChart username and password.  Masks are optional in the cancer centers. If you would like for your care team to wear a mask while they are taking care of you, please let them know. For doctor visits, patients may have with them one support person who is at least 65 years old. At this time, visitors are not allowed in the infusion area.

## 2022-01-04 ENCOUNTER — Telehealth: Payer: Self-pay | Admitting: Gastroenterology

## 2022-01-04 MED ORDER — RIFAXIMIN 550 MG PO TABS: 550.0000 mg | ORAL_TABLET | Freq: Two times a day (BID) | ORAL | 5 refills | Status: DC

## 2022-01-04 NOTE — Telephone Encounter (Signed)
PA for Xifaxan 550 mg tablets has been submitted to the insurance. Waiting on approval/denial.

## 2022-01-04 NOTE — Telephone Encounter (Signed)
Tammy, I am sending in RX for Xifaxan. Keep eye out for any possible P.A.

## 2022-01-04 NOTE — Telephone Encounter (Signed)
PA for Xifaxan 550 mg tablets has been approved from 01/04/2022 through 07/03/2022. Approval letter will be scanned in patient's chart.

## 2022-01-05 ENCOUNTER — Encounter: Payer: Self-pay | Admitting: Family Medicine

## 2022-01-05 ENCOUNTER — Other Ambulatory Visit: Payer: Self-pay | Admitting: *Deleted

## 2022-01-05 ENCOUNTER — Ambulatory Visit (INDEPENDENT_AMBULATORY_CARE_PROVIDER_SITE_OTHER): Payer: Medicare HMO | Admitting: Family Medicine

## 2022-01-05 ENCOUNTER — Telehealth: Payer: Self-pay

## 2022-01-05 VITALS — BP 108/58 | HR 66 | Temp 98.1°F | Ht 66.0 in | Wt 184.0 lb

## 2022-01-05 DIAGNOSIS — K7460 Unspecified cirrhosis of liver: Secondary | ICD-10-CM | POA: Diagnosis not present

## 2022-01-05 DIAGNOSIS — E119 Type 2 diabetes mellitus without complications: Secondary | ICD-10-CM

## 2022-01-05 DIAGNOSIS — N1832 Chronic kidney disease, stage 3b: Secondary | ICD-10-CM

## 2022-01-05 DIAGNOSIS — R188 Other ascites: Secondary | ICD-10-CM | POA: Diagnosis not present

## 2022-01-05 DIAGNOSIS — K7030 Alcoholic cirrhosis of liver without ascites: Secondary | ICD-10-CM | POA: Insufficient documentation

## 2022-01-05 MED ORDER — LEVEMIR FLEXTOUCH 100 UNIT/ML ~~LOC~~ SOPN: PEN_INJECTOR | SUBCUTANEOUS | Status: DC

## 2022-01-05 MED ORDER — DEXCOM G6 SENSOR MISC: 1.0000 [IU] | 1 refills | Status: AC

## 2022-01-05 MED ORDER — DEXCOM G6 SENSOR MISC: 1.0000 [IU] | 1 refills | Status: DC

## 2022-01-05 MED ORDER — NOVOLOG FLEXPEN 100 UNIT/ML ~~LOC~~ SOPN: PEN_INJECTOR | SUBCUTANEOUS | 2 refills | Status: DC

## 2022-01-05 MED ORDER — TADALAFIL 20 MG PO TABS: 10.0000 mg | ORAL_TABLET | ORAL | 11 refills | Status: DC | PRN

## 2022-01-05 MED ORDER — DEXCOM G6 RECEIVER DEVI: 1.0000 | 1 refills | Status: DC

## 2022-01-05 MED ORDER — PEN NEEDLES 31G X 6 MM MISC: 0 refills | Status: DC

## 2022-01-05 MED ORDER — DEXCOM G6 RECEIVER DEVI: 1.0000 | 1 refills | Status: AC

## 2022-01-05 NOTE — Telephone Encounter (Signed)
Pt's wife called in stating that the Xifaxan is $1000 a month and there is no way she is going to pay that and they won't qualify for patient assistance. Please advise.

## 2022-01-05 NOTE — Patient Instructions (Addendum)
Hi Austin Morgan It was good to see you today.  With your glucose running significantly elevated we need to ramp up your insulin.  For long-acting insulin I would recommend advancing that to 36 units each evening. In 3 to 4 days if your fasting sugars are still significantly elevated you will need to ramp up the long-acting insulin anywhere from 4-6 more units.  This can be adjusted every 3 days.  I would also recommend short acting insulin with your breakfast and supper.  Starting off I would recommend 5 units with meals.  As you stated today you are uncertain if the lactulose needs to be stopped when you start the new medicine.  As they start the new medication (Xifaxin) I will send a message to gastroenterology to clarify whether or not they want you to stop taking the lactulose.  We will let you know what we find out.  We are also ordering the Dexcom monitoring system.  It is important that you keep [place] updated on how you are doing.  I would recommend that you send [place] frequent updates.  More than likely we will have to actively keep bumping up the dose of your insulin to get your numbers down to a more effective level.  The goal is to see your morning sugars between 100-130 and later in the day below 170  We will see you back in 4 weeks but we need updates on a regular basis between now and then.  Feel free to reach out if any problems thanks-Dr. Nicki Reaper

## 2022-01-05 NOTE — Progress Notes (Unsigned)
   Subjective:    Patient ID: Austin Morgan, male    DOB: 1956-05-18, 66 y.o.   MRN: [id number]  HPI 4 week follow up chronic medical condition Taking lactulose 4 times per day  Fasting blood sugar 500's this morning at home  Unfortunately the newer medicine for his liver failure is extremely expensive he states he is not going to be able to afford it His sugars have been running quite high family has adjusted the insulin just a small amount He does have underlying diabetes obesity cirrhosis with liver failure  Review of Systems     Objective:   Physical Exam  Lungs clear no crackles heart regular extremities some edema consistent with his underlying cirrhosis      Assessment & Plan:  1. Diabetes mellitus without complication ([place]) With significant hyperglycemia bump up dose of Levemir Aggressively bump this up every 3 to 4 days to get fasting glucose near 100 Also Dexcom In addition to this short acting insulin 5 units twice daily with meals  - insulin detemir (LEVEMIR FLEXTOUCH) 100 UNIT/ML FlexPen; Inject 36 units into the skin every evening may titrate up to 54 units  Dispense: 15 mL - insulin aspart (NOVOLOG FLEXPEN) 100 UNIT/ML FlexPen; Inject 5 units into the skin with meals twice a day  Dispense: 15 mL; Refill: 2 - Continuous Blood Gluc Receiver (DEXCOM G6 RECEIVER) DEVI; 1 Device by Does not apply route continuous.  Dispense: 1 each; Refill: 1 - Continuous Blood Gluc Sensor (DEXCOM G6 SENSOR) MISC; 1 Units by Does not apply route continuous.  Dispense: 3 each; Refill: 1 - Insulin Pen Needle (PEN NEEDLES) 31G X 6 MM MISC; Please dispense as directed  Dispense: 100 each; Refill: 0  2. Stage 3b chronic kidney disease (CKD) ([place]) Under the care nephrology creatinine stable  3. Cirrhosis of liver with ascites, unspecified hepatic cirrhosis type ([place]) Under the care of GI on lactulose which is probably driving his significantly elevated glucose  I did speak with his wife  they will send [place] updates on a regular basis we will work closely with them follow-up in 1 month

## 2022-01-05 NOTE — Telephone Encounter (Signed)
Would be worth checking with drug rep to see about assistance or see if we can get tier exception?

## 2022-01-05 NOTE — Telephone Encounter (Signed)
Unable to reach rep by phone, sent email, waiting on response.

## 2022-01-07 ENCOUNTER — Encounter: Payer: Self-pay | Admitting: Family Medicine

## 2022-01-08 ENCOUNTER — Inpatient Hospital Stay (HOSPITAL_COMMUNITY): Payer: Medicare HMO

## 2022-01-08 VITALS — BP 110/69 | HR 67 | Temp 97.6°F | Resp 18

## 2022-01-08 DIAGNOSIS — D500 Iron deficiency anemia secondary to blood loss (chronic): Secondary | ICD-10-CM

## 2022-01-08 DIAGNOSIS — D631 Anemia in chronic kidney disease: Secondary | ICD-10-CM | POA: Diagnosis not present

## 2022-01-08 DIAGNOSIS — I5032 Chronic diastolic (congestive) heart failure: Secondary | ICD-10-CM | POA: Diagnosis not present

## 2022-01-08 DIAGNOSIS — N1832 Chronic kidney disease, stage 3b: Secondary | ICD-10-CM | POA: Diagnosis not present

## 2022-01-08 DIAGNOSIS — Z87891 Personal history of nicotine dependence: Secondary | ICD-10-CM | POA: Diagnosis not present

## 2022-01-08 DIAGNOSIS — I130 Hypertensive heart and chronic kidney disease with heart failure and stage 1 through stage 4 chronic kidney disease, or unspecified chronic kidney disease: Secondary | ICD-10-CM | POA: Diagnosis not present

## 2022-01-08 DIAGNOSIS — E1122 Type 2 diabetes mellitus with diabetic chronic kidney disease: Secondary | ICD-10-CM | POA: Diagnosis not present

## 2022-01-08 DIAGNOSIS — R161 Splenomegaly, not elsewhere classified: Secondary | ICD-10-CM | POA: Diagnosis not present

## 2022-01-08 DIAGNOSIS — D72819 Decreased white blood cell count, unspecified: Secondary | ICD-10-CM | POA: Diagnosis not present

## 2022-01-08 DIAGNOSIS — D509 Iron deficiency anemia, unspecified: Secondary | ICD-10-CM | POA: Diagnosis not present

## 2022-01-08 DIAGNOSIS — D696 Thrombocytopenia, unspecified: Secondary | ICD-10-CM | POA: Diagnosis not present

## 2022-01-08 DIAGNOSIS — D61818 Other pancytopenia: Secondary | ICD-10-CM | POA: Diagnosis not present

## 2022-01-08 DIAGNOSIS — R69 Illness, unspecified: Secondary | ICD-10-CM | POA: Diagnosis not present

## 2022-01-08 MED ORDER — LORATADINE 10 MG PO TABS: 10.0000 mg | ORAL_TABLET | Freq: Once | ORAL | Status: DC

## 2022-01-08 MED ORDER — SODIUM CHLORIDE 0.9 % IV SOLN
300.0000 mg | Freq: Once | INTRAVENOUS | Status: AC
  Administered 2022-01-08: 300 mg via INTRAVENOUS
  Filled 2022-01-08: qty 300

## 2022-01-08 MED ORDER — SODIUM CHLORIDE 0.9 % IV SOLN: Freq: Once | INTRAVENOUS | Status: AC

## 2022-01-08 MED ORDER — ONETOUCH ULTRA VI STRP: ORAL_STRIP | 0 refills | Status: DC

## 2022-01-08 NOTE — Patient Instructions (Signed)
[facility]  Discharge Instructions: Thank you for choosing [facility] to provide your oncology and hematology care.  If you have a lab appointment with the Cancer Center, please come in thru the Main Entrance and check in at the main information desk.  Wear comfortable clothing and clothing appropriate for easy access to any Portacath or PICC line.   We strive to give you quality time with your provider. You may need to reschedule your appointment if you arrive late (15 or more minutes).  Arriving late affects you and other patients whose appointments are after yours.  Also, if you miss three or more appointments without notifying the office, you may be dismissed from the clinic at the provider's discretion.      For prescription refill requests, have your pharmacy contact our office and allow 72 hours for refills to be completed.         To help prevent nausea and vomiting after your treatment, we encourage you to take your nausea medication as directed.  BELOW ARE SYMPTOMS THAT SHOULD BE REPORTED IMMEDIATELY: *FEVER GREATER THAN 100.4 F (38 C) OR HIGHER *CHILLS OR SWEATING *NAUSEA AND VOMITING THAT IS NOT CONTROLLED WITH YOUR NAUSEA MEDICATION *UNUSUAL SHORTNESS OF BREATH *UNUSUAL BRUISING OR BLEEDING *URINARY PROBLEMS (pain or burning when urinating, or frequent urination) *BOWEL PROBLEMS (unusual diarrhea, constipation, pain near the anus) TENDERNESS IN MOUTH AND THROAT WITH OR WITHOUT PRESENCE OF ULCERS (sore throat, sores in mouth, or a toothache) UNUSUAL RASH, SWELLING OR PAIN  UNUSUAL VAGINAL DISCHARGE OR ITCHING   Items with * indicate a potential emergency and should be followed up as soon as possible or go to the Emergency Department if any problems should occur.  Please show the CHEMOTHERAPY ALERT CARD or IMMUNOTHERAPY ALERT CARD at check-in to the Emergency Department and triage nurse.  Should you have questions after your visit or need to  cancel or reschedule your appointment, please contact [facility] [phone]  and follow the prompts.  Office hours are 8:00 a.m. to 4:30 p.m. Monday - Friday. Please note that voicemails left after 4:00 p.m. may not be returned until the following business day.  We are closed weekends and major holidays. You have access to a nurse at all times for urgent questions. Please call the main number to the clinic [phone] and follow the prompts.  For any non-urgent questions, you may also contact your provider using MyChart. We now offer e-Visits for anyone 18 and older to request care online for non-urgent symptoms. For details visit [email or web address].   Also download the MyChart app! Go to the app store, search "MyChart", open the app, select [facility], and log in with your MyChart username and password.  Masks are optional in the cancer centers. If you would like for your care team to wear a mask while they are taking care of you, please let them know. For doctor visits, patients may have with them one support person who is at least 65 years old. At this time, visitors are not allowed in the infusion area.  

## 2022-01-08 NOTE — Telephone Encounter (Signed)
Nurses-May send in order for glucometer strips due to severely elevated glucose to check 4 to 5 times per day for 1 month  Also you may share this message with [name]  As for the insulin I would recommend going up on the long-acting insulin to 42 units each evening In a couple days time if you are not seeing the numbers into the low 100s you may go up to 44 to 48 depending on where the glucoses are running.  Feel free to keep [place] updated.  As for the short acting 5 units with meals was a first step.  It does sound with his room numbers running in the 300s-500s that he may well need to go with a base of 8 units with meals and adjust upward depending on carbohydrate intake.  Certainly we do not want low sugars but we do not want glucoses in the 300s, 400s, 500s.  As for driving I would not recommend doing that until his sugars are consistently in the 100-200 range.  As for disability I certainly feel [place] has all the criteria to qualify for full disability should you all choose to pursue that.  If pursuing that then it would be through [employer] application for disability.  Plus also letting us know that you are doing that so we can make sure that our notes adequately reflect his current situation.  Please send [place] an update coming Wednesday thank you

## 2022-01-08 NOTE — Telephone Encounter (Signed)
Called and lmom for rep to return my call.

## 2022-01-08 NOTE — Progress Notes (Signed)
Patient presents today for iron infusion.  Patient is in satisfactory condition with no complaints voiced. Vital signs are stable.  Zyrtec 10 mg was taken at home prior to visit at [place].  We will proceed with treatment per MD orders.   Patient tolerated treatment well with no complaints voiced.  Patient left ambulatory in stable condition.  Vital signs stable at discharge.  Follow up as scheduled.

## 2022-01-09 NOTE — Telephone Encounter (Signed)
Austin Morgan from [facility] called and stated that the only option this patient would have would be the patient assistance route but due to patient's income level he would not be approved.

## 2022-01-11 ENCOUNTER — Other Ambulatory Visit (HOSPITAL_COMMUNITY)
Admission: RE | Admit: 2022-01-11 | Discharge: 2022-01-11 | Disposition: A | Discharge status: Home / Self Care | Payer: Medicare HMO | Source: Ambulatory Visit | Attending: Nephrology | Admitting: Nephrology

## 2022-01-11 DIAGNOSIS — D72819 Decreased white blood cell count, unspecified: Secondary | ICD-10-CM | POA: Insufficient documentation

## 2022-01-11 DIAGNOSIS — J918 Pleural effusion in other conditions classified elsewhere: Secondary | ICD-10-CM | POA: Insufficient documentation

## 2022-01-11 DIAGNOSIS — N189 Chronic kidney disease, unspecified: Secondary | ICD-10-CM | POA: Insufficient documentation

## 2022-01-11 DIAGNOSIS — I129 Hypertensive chronic kidney disease with stage 1 through stage 4 chronic kidney disease, or unspecified chronic kidney disease: Secondary | ICD-10-CM | POA: Insufficient documentation

## 2022-01-11 DIAGNOSIS — K769 Liver disease, unspecified: Secondary | ICD-10-CM | POA: Diagnosis not present

## 2022-01-11 DIAGNOSIS — D6959 Other secondary thrombocytopenia: Secondary | ICD-10-CM | POA: Insufficient documentation

## 2022-01-11 DIAGNOSIS — R778 Other specified abnormalities of plasma proteins: Secondary | ICD-10-CM | POA: Diagnosis not present

## 2022-01-11 DIAGNOSIS — R809 Proteinuria, unspecified: Secondary | ICD-10-CM | POA: Diagnosis not present

## 2022-01-11 DIAGNOSIS — I952 Hypotension due to drugs: Secondary | ICD-10-CM | POA: Diagnosis not present

## 2022-01-11 DIAGNOSIS — E1122 Type 2 diabetes mellitus with diabetic chronic kidney disease: Secondary | ICD-10-CM | POA: Diagnosis not present

## 2022-01-11 DIAGNOSIS — N170 Acute kidney failure with tubular necrosis: Secondary | ICD-10-CM | POA: Insufficient documentation

## 2022-01-11 DIAGNOSIS — K7460 Unspecified cirrhosis of liver: Secondary | ICD-10-CM | POA: Insufficient documentation

## 2022-01-11 DIAGNOSIS — D638 Anemia in other chronic diseases classified elsewhere: Secondary | ICD-10-CM | POA: Diagnosis not present

## 2022-01-11 DIAGNOSIS — Z8679 Personal history of other diseases of the circulatory system: Secondary | ICD-10-CM | POA: Diagnosis not present

## 2022-01-11 DIAGNOSIS — E1129 Type 2 diabetes mellitus with other diabetic kidney complication: Secondary | ICD-10-CM | POA: Insufficient documentation

## 2022-01-11 LAB — RENAL FUNCTION PANEL
Albumin: 3.5 g/dL (ref 3.5–5.0)
Anion gap: 8 (ref 5–15)
BUN: 51 mg/dL — ABNORMAL HIGH (ref 8–23)
CO2: 25 mmol/L (ref 22–32)
Calcium: 9.8 mg/dL (ref 8.9–10.3)
Chloride: 103 mmol/L (ref 98–111)
Creatinine, Ser: 2.85 mg/dL — ABNORMAL HIGH (ref 0.61–1.24)
GFR, Estimated: 24 mL/min — ABNORMAL LOW (ref >60)
Glucose, Bld: 180 mg/dL — ABNORMAL HIGH (ref 70–99)
Phosphorus: 4.4 mg/dL (ref 2.5–4.6)
Potassium: 4.1 mmol/L (ref 3.5–5.1)
Sodium: 136 mmol/L (ref 135–145)

## 2022-01-11 LAB — PROTEIN / CREATININE RATIO, URINE
Creatinine, Urine: 163 mg/dL
Protein Creatinine Ratio: 0.11 mg/mg{Cre} (ref 0.00–0.15)
Total Protein, Urine: 18 mg/dL

## 2022-01-11 LAB — CBC
HCT: 29.1 % — ABNORMAL LOW (ref 39.0–52.0)
Hemoglobin: 9.8 g/dL — ABNORMAL LOW (ref 13.0–17.0)
MCH: 31.1 pg (ref 26.0–34.0)
MCHC: 33.7 g/dL (ref 30.0–36.0)
MCV: 92.4 fL (ref 80.0–100.0)
Platelets: 138 10*3/uL — ABNORMAL LOW (ref 150–400)
RBC: 3.15 MIL/uL — ABNORMAL LOW (ref 4.22–5.81)
RDW: 16 % — ABNORMAL HIGH (ref 11.5–15.5)
WBC: 6.8 10*3/uL (ref 4.0–10.5)
nRBC: 0 % (ref 0.0–0.2)

## 2022-01-17 NOTE — Telephone Encounter (Signed)
Noted. See mychart message. He is going to stay on lactulose.

## 2022-01-22 ENCOUNTER — Encounter: Payer: Self-pay | Admitting: Family Medicine

## 2022-01-22 DIAGNOSIS — E119 Type 2 diabetes mellitus without complications: Secondary | ICD-10-CM

## 2022-01-22 NOTE — Telephone Encounter (Signed)
Nurses Because he has had some low glucose spells we do not want to bump up the dose of the long-acting currently.  I would recommend sticking with the 50 units may titrate up to 60 under Dr.'s direction.  Please do a new prescription for them [name] and Eris can keep [place] updated thank you

## 2022-01-23 MED ORDER — LEVEMIR FLEXTOUCH 100 UNIT/ML ~~LOC~~ SOPN: PEN_INJECTOR | SUBCUTANEOUS | 1 refills | Status: DC

## 2022-02-01 DIAGNOSIS — K7460 Unspecified cirrhosis of liver: Secondary | ICD-10-CM | POA: Diagnosis not present

## 2022-02-01 DIAGNOSIS — E1122 Type 2 diabetes mellitus with diabetic chronic kidney disease: Secondary | ICD-10-CM | POA: Diagnosis not present

## 2022-02-01 DIAGNOSIS — R809 Proteinuria, unspecified: Secondary | ICD-10-CM | POA: Diagnosis not present

## 2022-02-01 DIAGNOSIS — E1129 Type 2 diabetes mellitus with other diabetic kidney complication: Secondary | ICD-10-CM | POA: Diagnosis not present

## 2022-02-01 DIAGNOSIS — Z8679 Personal history of other diseases of the circulatory system: Secondary | ICD-10-CM | POA: Diagnosis not present

## 2022-02-01 DIAGNOSIS — D6959 Other secondary thrombocytopenia: Secondary | ICD-10-CM | POA: Diagnosis not present

## 2022-02-01 DIAGNOSIS — I952 Hypotension due to drugs: Secondary | ICD-10-CM | POA: Diagnosis not present

## 2022-02-01 DIAGNOSIS — D638 Anemia in other chronic diseases classified elsewhere: Secondary | ICD-10-CM | POA: Diagnosis not present

## 2022-02-01 DIAGNOSIS — N170 Acute kidney failure with tubular necrosis: Secondary | ICD-10-CM | POA: Diagnosis not present

## 2022-02-01 DIAGNOSIS — N189 Chronic kidney disease, unspecified: Secondary | ICD-10-CM | POA: Diagnosis not present

## 2022-02-05 ENCOUNTER — Ambulatory Visit (INDEPENDENT_AMBULATORY_CARE_PROVIDER_SITE_OTHER): Payer: Medicare HMO | Admitting: Family Medicine

## 2022-02-05 ENCOUNTER — Encounter: Payer: Self-pay | Admitting: Family Medicine

## 2022-02-05 VITALS — BP 134/78 | Wt 205.0 lb

## 2022-02-05 DIAGNOSIS — E1169 Type 2 diabetes mellitus with other specified complication: Secondary | ICD-10-CM

## 2022-02-05 DIAGNOSIS — E785 Hyperlipidemia, unspecified: Secondary | ICD-10-CM | POA: Diagnosis not present

## 2022-02-05 DIAGNOSIS — F5101 Primary insomnia: Secondary | ICD-10-CM

## 2022-02-05 DIAGNOSIS — I10 Essential (primary) hypertension: Secondary | ICD-10-CM

## 2022-02-05 DIAGNOSIS — K7460 Unspecified cirrhosis of liver: Secondary | ICD-10-CM | POA: Diagnosis not present

## 2022-02-05 DIAGNOSIS — Z125 Encounter for screening for malignant neoplasm of prostate: Secondary | ICD-10-CM

## 2022-02-05 DIAGNOSIS — R188 Other ascites: Secondary | ICD-10-CM | POA: Diagnosis not present

## 2022-02-05 DIAGNOSIS — R69 Illness, unspecified: Secondary | ICD-10-CM | POA: Diagnosis not present

## 2022-02-05 DIAGNOSIS — N1832 Chronic kidney disease, stage 3b: Secondary | ICD-10-CM

## 2022-02-05 NOTE — Progress Notes (Addendum)
Subjective:    Patient ID: Austin Morgan, male    DOB: 1955/12/07, 66 y.o.   MRN: [id number]  HPI Pt here for following up on diabetes. Pt states he has some numbers under 100; decreased Levemir to 44 units. Pt having bilateral ankle swelling. Pt has not yet received his Dexcom. Checking sugars every 4 hours; before eating.   Pt states he is not sleeping through the night.  So written tomorrow which Ortho she seen Primary hypertension - Plan: Hemoglobin A1c, PSA  Stage 3b chronic kidney disease (CKD) ([place]) - Plan: Hemoglobin A1c, PSA  Cirrhosis of liver with ascites, unspecified hepatic cirrhosis type ([place])  Hyperlipidemia associated with type 2 diabetes mellitus ([place])  Primary insomnia  Screening PSA (prostate specific antigen) - Plan: Hemoglobin A1c, PSA Patient is dealing with cirrhosis this is causing significant swelling in his legs as well as renal troubles.  He is seen both gastroenterology and nephrologist. Review of Systems     Objective:   Physical Exam  General-in no acute distress Eyes-no discharge Lungs-respiratory rate normal, CTA CV-no murmurs,RRR Extremities skin warm dry 2+ edema Neuro grossly normal Behavior normal, alert  Very nice patient working through difficult issues also having to help out taking care of his mother-in-law     Assessment & Plan:  1. Primary hypertension Blood pressure on the low end but reasonable. - Hemoglobin A1c - PSA  2. Stage 3b chronic kidney disease (CKD) ([facility]) Creatinine elevated healthy diet will be seen by kidney doctor coming up near future - Hemoglobin A1c - PSA  3. Cirrhosis of liver with ascites, unspecified hepatic cirrhosis type [facility]) May need to have fluid removed in the near future depends on how things go with gastroenterology   4. Hyperlipidemia associated with type 2 diabetes mellitus ([place]) Not a statin candidate because of the cirrhosis  5. Primary insomnia We will need to do some research to  figure out what would be the best choice possibly trazodone or something different to help him with rest  6. Screening PSA (prostate specific antigen) Screening - Hemoglobin A1c - PSA  Patient on several shots of insulin per day has difficult to control diabetes would benefit from [facility]  Current Outpatient Medications  Medication Instructions   albuterol (VENTOLIN HFA) 108 (90 Base) MCG/ACT inhaler 2 puffs, Inhalation, Every 6 hours PRN   blood glucose meter kit and supplies KIT Dispense based on patient and insurance preference. Use up to BID as directed.   Continuous Blood Gluc Receiver (DEXCOM G6 RECEIVER) DEVI 1 Device, Does not apply, Continuous   Continuous Blood Gluc Sensor (DEXCOM G6 SENSOR) MISC 1 Units, Does not apply, Continuous   ferrous sulfate 325 (65 FE) MG EC tablet 1 tablet, Oral, Every morning   furosemide (LASIX) 40 mg, Oral, 2 times daily, 40 mg twice day on Mon, Wed, Fri 40 mg in the morning and 20 mg in the evening on Tues, Thurs, Sat and Sun   glucose blood (ONETOUCH ULTRA) test strip Use to check blood sugars 4-5 times per day   insulin aspart (NOVOLOG FLEXPEN) 100 UNIT/ML FlexPen Inject 5 units into the skin with meals twice a day   insulin detemir (LEVEMIR FLEXTOUCH) 100 UNIT/ML FlexPen Inject 50 units into the skin every evening may titrate up to 60 units   Insulin Pen Needle (PEN NEEDLES) 31G X 6 MM MISC Please dispense as directed   lactulose (CHRONULAC) 20 g, Oral, 3 times daily   Multiple Vitamins-Minerals (MULTIVITAMIN WITH MINERALS) tablet  1 tablet, Oral, Daily   nadolol (CORGARD) 40 MG tablet Take 1/2 (one half) tablet po daily   ondansetron (ZOFRAN) 8 MG tablet TAKE 1 TABLET BY MOUTH THREE TIMES DAILY AS NEEDED FOR NAUSEA   pantoprazole (PROTONIX) 40 mg, Oral, 2 times daily   pramipexole (MIRAPEX) 0.5 mg, Oral, Daily at bedtime, TAKE 1 TABLET BY MOUTH NIGHTLY AT  LEAST  2  HOURS  BEFORE  BEDTIME  FOR  RESTLESS  LEGS   spironolactone (ALDACTONE) 25 mg,  Oral, Daily   tadalafil (CIALIS) 10-20 mg, Oral, Every 48 hours PRN   traZODone (DESYREL) 100 mg, Oral, At bedtime PRN, for sleep

## 2022-02-06 ENCOUNTER — Encounter: Payer: Self-pay | Admitting: Internal Medicine

## 2022-02-06 ENCOUNTER — Ambulatory Visit (INDEPENDENT_AMBULATORY_CARE_PROVIDER_SITE_OTHER): Payer: Medicare HMO | Admitting: Internal Medicine

## 2022-02-06 VITALS — BP 124/66 | HR 76 | Temp 98.4°F | Ht 66.0 in | Wt 213.2 lb

## 2022-02-06 DIAGNOSIS — Z86010 Personal history of colonic polyps: Secondary | ICD-10-CM

## 2022-02-06 DIAGNOSIS — K7460 Unspecified cirrhosis of liver: Secondary | ICD-10-CM | POA: Diagnosis not present

## 2022-02-06 DIAGNOSIS — D649 Anemia, unspecified: Secondary | ICD-10-CM

## 2022-02-06 DIAGNOSIS — K219 Gastro-esophageal reflux disease without esophagitis: Secondary | ICD-10-CM | POA: Diagnosis not present

## 2022-02-06 NOTE — Progress Notes (Unsigned)
Primary Care Physician:  Kathyrn Drown, MD Primary Gastroenterologist:  Dr. Gala Romney  Pre-Procedure History & Physical: HPI:  Austin Morgan is a 66 y.o. male here for follow-up EtOH related cirrhosis.  MELD sodium 17 back in July.  Having 2-3 semiformed bowel members daily on lactulose.  No bleeding.  Will be due for a EGD in the next 1 to 2 months for surveillance.  Continues on nadolol.  He is gained 13 pounds since his last visit.  He attributes this to having a very good appetite.  He continues to be sober.  He does have some lower extremity edema.  On Lasix and Aldactone.  Seeing Dr. Theador Hawthorne tomorrow.  There has been ongoing modification his diuretic regimen.  He is fairly well versed on a 2 g sodium diet.  Up-to-date on hepatoma screening: Normal AFP.  History multiple colonic adenomas; due for colonoscopy 2024. Followed by Dr. Raliegh Ip in the hematology clinic.  On iron.  GERD well controlled on twice daily PPI.  No dysphagia.  Immune to hepatitis a and B based on prior serologies.  Past Medical History:  Diagnosis Date   Anxiety    Arthritis    Depression    Diabetes mellitus without complication ([place])    ED (erectile dysfunction)    Gout    H/O ETOH abuse    Hyperlipidemia    Hypertension    Hypertriglyceridemia    Iron deficiency anemia due to chronic blood loss 12/20/2021   PONV (postoperative nausea and vomiting)    Sleep apnea     Past Surgical History:  Procedure Laterality Date   CARPAL TUNNEL RELEASE Bilateral    COLONOSCOPY  2010   Dr. Delfin Edis: mild diverticulosis, next colonoscopy 2020   COLONOSCOPY WITH PROPOFOL N/A 01/21/2020   Josslynn Mentzer: 9 polyps removed, multiple tubular adenomas.  Next colonoscopy in 3 years.   ELBOW SURGERY     ESOPHAGEAL BANDING  09/28/2021   Procedure: ESOPHAGEAL BANDING;  Surgeon: [name], MD;  [facility];  Service: Endoscopy;;   ESOPHAGOGASTRODUODENOSCOPY (EGD) WITH PROPOFOL N/A 01/21/2020   Babyboy Loya: normal    ESOPHAGOGASTRODUODENOSCOPY (EGD) WITH PROPOFOL N/A 09/28/2021   Procedure: ESOPHAGOGASTRODUODENOSCOPY (EGD) WITH PROPOFOL;  Surgeon: [name], MD;  [facility];  Service: Endoscopy;  Laterality: N/A;  3:00pm   ESOPHAGOGASTRODUODENOSCOPY (EGD) WITH PROPOFOL N/A 10/31/2021   Procedure: ESOPHAGOGASTRODUODENOSCOPY (EGD) WITH PROPOFOL;  Surgeon: Harvel Quale, MD;  [facility];  Service: Gastroenterology;  Laterality: N/A;   NECK SURGERY     c4   POLYPECTOMY  01/21/2020   Procedure: POLYPECTOMY;  Surgeon: [name], MD;  [facility]: [facility];  Service: Endoscopy;;  colon    Prior to Admission medications   Medication Sig Start Date End Date Taking? Authorizing Provider  albuterol (VENTOLIN HFA) 108 (90 Base) MCG/ACT inhaler Inhale 2 puffs into the lungs every 6 (six) hours as needed for wheezing. 10/24/21  Yes Kathyrn Drown, MD  blood glucose meter kit and supplies KIT Dispense based on patient and insurance preference. Use up to BID as directed. 03/20/19  Yes Nilda Simmer, NP  ferrous sulfate 325 (65 FE) MG EC tablet Take 1 tablet by mouth every morning. 11/27/21  Yes [provider]  furosemide (LASIX) 20 MG tablet Take 40 mg by mouth 2 (two) times daily. 40 mg twice day on Mon, Wed, Fri 40 mg in the morning and 20 mg in the evening on Tues, Thurs,  Sat and Sun 11/27/21 11/27/22 Yes [provider]  glucose blood (ONETOUCH ULTRA) test strip Use to check blood sugars 4-5 times per day 01/08/22  Yes Luking, Elayne Snare, MD  insulin aspart (NOVOLOG FLEXPEN) 100 UNIT/ML FlexPen Inject 5 units into the skin with meals twice a day 01/05/22  Yes Luking, Scott A, MD  insulin detemir (LEVEMIR FLEXTOUCH) 100 UNIT/ML FlexPen Inject 50 units into the skin every evening may titrate up to 60 units 01/23/22  Yes Luking, Scott A, MD  Insulin Pen Needle (PEN NEEDLES) 31G X 6 MM MISC Please dispense as directed 01/05/22  Yes Luking, Elayne Snare, MD  lactulose  (CHRONULAC) 10 GM/15ML solution Take 30 mLs (20 g total) by mouth 3 (three) times daily. 12/25/21  Yes Mahala Menghini, PA-C  Multiple Vitamins-Minerals (MULTIVITAMIN WITH MINERALS) tablet Take 1 tablet by mouth daily.   Yes [provider]  nadolol (CORGARD) 40 MG tablet Take 1/2 (one half) tablet po daily 10/24/21  Yes Luking, Scott A, MD  ondansetron (ZOFRAN) 8 MG tablet TAKE 1 TABLET BY MOUTH THREE TIMES DAILY AS NEEDED FOR NAUSEA 12/15/21  Yes Luking, Scott A, MD  pantoprazole (PROTONIX) 40 MG tablet Take 1 tablet (40 mg total) by mouth 2 (two) times daily. 09/25/21  Yes Kathyrn Drown, MD  pramipexole (MIRAPEX) 0.25 MG tablet Take 2 tablets (0.5 mg total) by mouth at bedtime. TAKE 1 TABLET BY MOUTH NIGHTLY AT  LEAST  2  HOURS  BEFORE  BEDTIME  FOR  RESTLESS  LEGS Patient taking differently: Take 0.25 mg by mouth at bedtime. TAKE 1 TABLET BY MOUTH NIGHTLY AT  LEAST  2  HOURS  BEFORE  BEDTIME  FOR  RESTLESS  LEGS 11/02/21  Yes Johnson, Clanford L, MD  spironolactone (ALDACTONE) 25 MG tablet Take 25 mg by mouth daily. 11/27/21  Yes [provider]  tadalafil (CIALIS) 20 MG tablet Take 0.5-1 tablets (10-20 mg total) by mouth every other day as needed for erectile dysfunction. 01/05/22  Yes Kathyrn Drown, MD  traZODone (DESYREL) 100 MG tablet Take 1 tablet (100 mg total) by mouth at bedtime as needed. for sleep 08/15/21  Yes Luking, Elayne Snare, MD  Continuous Blood Gluc Receiver (DEXCOM G6 RECEIVER) DEVI 1 Device by Does not apply route continuous. Patient not taking: Reported on 02/05/2022 01/05/22   Kathyrn Drown, MD  Continuous Blood Gluc Sensor (DEXCOM G6 SENSOR) MISC 1 Units by Does not apply route continuous. Patient not taking: Reported on 02/05/2022 01/05/22   Kathyrn Drown, MD    Allergies as of 02/06/2022 - Review Complete 02/06/2022  Allergen Reaction Noted   Codeine Nausea And Vomiting 11/26/2008    Family History  Problem Relation Age of Onset   Heart attack Father     Heart Problems Brother        Had open heart surgery   Colon cancer Neg Hx     Social History   Socioeconomic History   Marital status: Married    Spouse name: Not on file   Number of children: Not on file   Years of education: Not on file   Highest education level: Not on file  Occupational History   Not on file  Tobacco Use   Smoking status: Former    Packs/day: 1.00    Years: 36.00    Total pack years: 36.00    Types: Cigarettes    Quit date: 03/11/2016    Years since quitting: 5.9  Passive exposure: Past   Smokeless tobacco: Never  Vaping Use   Vaping Use: Never used  Substance and Sexual Activity   Alcohol use: Not Currently    Comment: drinks a fifth of liquor per week; inpatient rehab 08/2019. denied 10/14/19. No etoh since 03/2021 (as of 09/22/21)   Drug use: No   Sexual activity: Yes  Other Topics Concern   Not on file  Social History Narrative   Wife, Ulysse Siemen NP.   Social Determinants of Health   Financial Resource Strain: Not on file  Food Insecurity: Not on file  Transportation Needs: Not on file  Physical Activity: Not on file  Stress: Not on file  Social Connections: Not on file  Intimate Partner Violence: Not on file    Review of Systems: See HPI, otherwise negative ROS  Physical Exam: BP 124/66 (BP Location: Right Arm, Patient Position: Sitting, Cuff Size: Normal)   Pulse 76   Temp 98.4 F (36.9 C) (Temporal)   Ht 5' 6"  (1.676 m)   Wt 213 lb 3.2 oz (96.7 kg)   SpO2 93%   BMI 34.41 kg/m  General:   Alert,  Well-developed, well-nourished, pleasant and cooperative in NAD no flap. Neck:  Supple; no masses or thyromegaly. No significant cervical adenopathy. Lungs:  Clear throughout to auscultation.   No wheezes, crackles, or rhonchi. No acute distress. Heart:  Regular rate and rhythm; no murmurs, clicks, rubs,  or gallops. Abdomen: Non-distended, normal bowel sounds.  Soft and nontender without appreciable mass or hepatosplenomegaly.   Pulses:  Normal pulses noted. Extremities: 1-2+ lower extremity edema.  Impression/Plan: 66 year old gentleman EtOH related cirrhosis.  Ongoing sobriety. Diuretic therapy directed by Dr. Theador Hawthorne.   He needs a surveillance EGD in the next 1 to 2 months.  Needs a surveillance colonoscopy next year.  Ongoing hepatoma screening with ultrasound every 6 months Need to check back and see where we stand on referral to the transplant clinic for initial consultation.  Recommendations:  We will planned a surveillance EGD to check on esophageal varices between now and mid October.  ASA 3  Decrease her pantoprazole to 40 mg once daily 30 minutes before breakfast  Titrate your lactulose so that she have 3-4 semiformed stools daily  Glad you are following up with Dr. Theador Hawthorne tomorrow.  Grams sodium diet reviewed.  Office visit here in 6 months  Plan for surveillance colonoscopy 2024.       Notice: This dictation was prepared with Dragon dictation along with smaller phrase technology. Any transcriptional errors that result from this process are unintentional and may not be corrected upon review.

## 2022-02-06 NOTE — Patient Instructions (Signed)
It was good to see you again today!  We will planned a surveillance EGD to check on esophageal varices between now and mid October.  ASA 3  Decrease her pantoprazole to 40 mg once daily 30 minutes before breakfast  Titrate your lactulose so that she have 3-4 semiformed stools daily  Glad you are following up with Dr. Theador Hawthorne tomorrow.  Grams sodium diet reviewed.  Office visit here in 6 months  Plan for surveillance colonoscopy 2024.

## 2022-02-07 DIAGNOSIS — K7460 Unspecified cirrhosis of liver: Secondary | ICD-10-CM | POA: Diagnosis not present

## 2022-02-07 DIAGNOSIS — I5033 Acute on chronic diastolic (congestive) heart failure: Secondary | ICD-10-CM | POA: Diagnosis not present

## 2022-02-07 DIAGNOSIS — D6959 Other secondary thrombocytopenia: Secondary | ICD-10-CM | POA: Diagnosis not present

## 2022-02-07 DIAGNOSIS — E1122 Type 2 diabetes mellitus with diabetic chronic kidney disease: Secondary | ICD-10-CM | POA: Diagnosis not present

## 2022-02-07 DIAGNOSIS — N189 Chronic kidney disease, unspecified: Secondary | ICD-10-CM | POA: Diagnosis not present

## 2022-02-07 DIAGNOSIS — E1129 Type 2 diabetes mellitus with other diabetic kidney complication: Secondary | ICD-10-CM | POA: Diagnosis not present

## 2022-02-07 DIAGNOSIS — I129 Hypertensive chronic kidney disease with stage 1 through stage 4 chronic kidney disease, or unspecified chronic kidney disease: Secondary | ICD-10-CM | POA: Diagnosis not present

## 2022-02-07 DIAGNOSIS — N170 Acute kidney failure with tubular necrosis: Secondary | ICD-10-CM | POA: Diagnosis not present

## 2022-02-07 DIAGNOSIS — D638 Anemia in other chronic diseases classified elsewhere: Secondary | ICD-10-CM | POA: Diagnosis not present

## 2022-02-07 DIAGNOSIS — R809 Proteinuria, unspecified: Secondary | ICD-10-CM | POA: Diagnosis not present

## 2022-02-14 DIAGNOSIS — K7460 Unspecified cirrhosis of liver: Secondary | ICD-10-CM | POA: Diagnosis not present

## 2022-02-14 DIAGNOSIS — E1165 Type 2 diabetes mellitus with hyperglycemia: Secondary | ICD-10-CM | POA: Diagnosis not present

## 2022-02-14 DIAGNOSIS — E1122 Type 2 diabetes mellitus with diabetic chronic kidney disease: Secondary | ICD-10-CM | POA: Diagnosis not present

## 2022-02-14 DIAGNOSIS — D638 Anemia in other chronic diseases classified elsewhere: Secondary | ICD-10-CM | POA: Diagnosis not present

## 2022-02-14 DIAGNOSIS — R809 Proteinuria, unspecified: Secondary | ICD-10-CM | POA: Diagnosis not present

## 2022-02-14 DIAGNOSIS — R110 Nausea: Secondary | ICD-10-CM | POA: Diagnosis not present

## 2022-02-14 DIAGNOSIS — N189 Chronic kidney disease, unspecified: Secondary | ICD-10-CM | POA: Diagnosis not present

## 2022-02-14 DIAGNOSIS — I5033 Acute on chronic diastolic (congestive) heart failure: Secondary | ICD-10-CM | POA: Diagnosis not present

## 2022-02-14 DIAGNOSIS — E1129 Type 2 diabetes mellitus with other diabetic kidney complication: Secondary | ICD-10-CM | POA: Diagnosis not present

## 2022-02-15 ENCOUNTER — Encounter (HOSPITAL_COMMUNITY)
Admission: RE | Admit: 2022-02-15 | Discharge: 2022-02-15 | Disposition: A | Discharge status: Home / Self Care | Payer: Medicare HMO | Source: Ambulatory Visit | Attending: Nephrology | Admitting: Nephrology

## 2022-02-15 DIAGNOSIS — N289 Disorder of kidney and ureter, unspecified: Secondary | ICD-10-CM

## 2022-02-19 NOTE — Progress Notes (Unsigned)
[place], [place] [address]   CLINIC:  Medical Oncology/Hematology  PCP:  Kathyrn Drown, MD [address] [place] [address] [phone]   REASON FOR VISIT:  Follow-up for normocytic anemia (iron deficiency + CKD), thrombocytopenia, and leukopenia  CURRENT THERAPY: Intermittent iron infusions  INTERVAL HISTORY:  Austin Morgan 66 y.o. male returns for routine follow-up of his iron deficiency anemia, anemia of CKD, thrombocytopenia, and leukopenia.  He was seen for initial consultation by Dr. Delton Coombes and Tarri Abernethy PA-C on 12/20/2021.  At today's visit, he reports feeling fair.  No recent hospitalizations, surgeries, or changes in baseline health status.  He did not notice any change in his symptoms after IV iron in July 2023.  He continues to report significant fatigue as well as ice pica and restless legs.  He denies any headaches, chest pain, dyspnea on exertion, palpitations, lightheadedness, or syncope.  Patient denies any hematemesis, melena, or hematochezia since his last visit.  He has occasional nosebleeds.  He admits to easy bruising but denies petechial rash.  No frequent infections or B symptoms such as fever, chills, night sweats, unintentional weight loss.  He denies any new bone pain or neurologic changes.  He is taking daily iron supplement/vitamin C, takes it at separate times in his Protonix.     He has 40% energy and 100% appetite. He endorses that he is maintaining a stable weight.   REVIEW OF SYSTEMS:  Review of Systems  Constitutional:  Positive for fatigue. Negative for appetite change, chills, diaphoresis, fever and unexpected weight change.  HENT:   Negative for lump/mass and nosebleeds.   Eyes:  Negative for eye problems.  Respiratory:  Negative for cough, hemoptysis and shortness of breath.   Cardiovascular:  Positive for leg swelling. Negative for chest pain and palpitations.  Gastrointestinal:   Negative for abdominal pain, blood in stool, constipation, diarrhea, nausea and vomiting.  Genitourinary:  Negative for hematuria.   Skin: Negative.   Neurological:  Positive for numbness. Negative for dizziness, headaches and light-headedness.  Hematological:  Does not bruise/bleed easily.      PAST MEDICAL/SURGICAL HISTORY:  Past Medical History:  Diagnosis Date   Anxiety    Arthritis    Depression    Diabetes mellitus without complication ([place])    ED (erectile dysfunction)    Gout    H/O ETOH abuse    Hyperlipidemia    Hypertension    Hypertriglyceridemia    Iron deficiency anemia due to chronic blood loss 12/20/2021   PONV (postoperative nausea and vomiting)    Sleep apnea    Past Surgical History:  Procedure Laterality Date   CARPAL TUNNEL RELEASE Bilateral    COLONOSCOPY  2010   Dr. Delfin Edis: mild diverticulosis, next colonoscopy 2020   COLONOSCOPY WITH PROPOFOL N/A 01/21/2020   Rourk: 9 polyps removed, multiple tubular adenomas.  Next colonoscopy in 3 years.   ELBOW SURGERY     ESOPHAGEAL BANDING  09/28/2021   Procedure: ESOPHAGEAL BANDING;  Surgeon: [name], MD;  [facility];  Service: Endoscopy;;   ESOPHAGOGASTRODUODENOSCOPY (EGD) WITH PROPOFOL N/A 01/21/2020   [facility]k: normal   ESOPHAGOGASTRODUODENOSCOPY (EGD) WITH PROPOFOL N/A 09/28/2021   Procedure: ESOPHAGOGASTRODUODENOSCOPY (EGD) WITH PROPOFOL;  Surgeon: [name], MD;  [facility];  Service: Endoscopy;  Laterality: N/A;  3:00pm   ESOPHAGOGASTRODUODENOSCOPY (EGD) WITH PROPOFOL N/A 10/31/2021   Procedure: ESOPHAGOGASTRODUODENOSCOPY (EGD) WITH PROPOFOL;  Surgeon: Montez Morita,  Quillian Quince, MD;  [facility];  Service: Gastroenterology;  Laterality: N/A;   NECK SURGERY     c4   POLYPECTOMY  01/21/2020   Procedure: POLYPECTOMY;  Surgeon: [name], MD;  [facility];  Service: Endoscopy;;  colon     SOCIAL HISTORY:  Social History    Socioeconomic History   Marital status: Married    Spouse name: Not on file   Number of children: Not on file   Years of education: Not on file   Highest education level: Not on file  Occupational History   Not on file  Tobacco Use   Smoking status: Former    Packs/day: 1.00    Years: 36.00    Total pack years: 36.00    Types: Cigarettes    Quit date: 03/11/2016    Years since quitting: 5.9    Passive exposure: Past   Smokeless tobacco: Never  Vaping Use   Vaping Use: Never used  Substance and Sexual Activity   Alcohol use: Not Currently    Comment: drinks a fifth of liquor per week; inpatient rehab 08/2019. denied 10/14/19. No etoh since 03/2021 (as of 09/22/21)   Drug use: No   Sexual activity: Yes  Other Topics Concern   Not on file  Social History Narrative   Wife, Bakary Bramer NP.   Social Determinants of Health   Financial Resource Strain: Not on file  Food Insecurity: Not on file  Transportation Needs: Not on file  Physical Activity: Not on file  Stress: Not on file  Social Connections: Not on file  Intimate Partner Violence: Not on file    FAMILY HISTORY:  Family History  Problem Relation Age of Onset   Heart attack Father    Heart Problems Brother        Had open heart surgery   Colon cancer Neg Hx     CURRENT MEDICATIONS:  Outpatient Encounter Medications as of 02/20/2022  Medication Sig Note   albuterol (VENTOLIN HFA) 108 (90 Base) MCG/ACT inhaler Inhale 2 puffs into the lungs every 6 (six) hours as needed for wheezing.    blood glucose meter kit and supplies KIT Dispense based on patient and insurance preference. Use up to BID as directed.    Continuous Blood Gluc Receiver (DEXCOM G6 RECEIVER) DEVI 1 Device by Does not apply route continuous. (Patient not taking: Reported on 02/05/2022)    Continuous Blood Gluc Sensor (DEXCOM G6 SENSOR) MISC 1 Units by Does not apply route continuous. (Patient not taking: Reported on 02/05/2022)    ferrous sulfate 325  (65 FE) MG EC tablet Take 1 tablet by mouth every morning.    furosemide (LASIX) 20 MG tablet Take 40 mg by mouth 2 (two) times daily. 40 mg twice day on Mon, Wed, Fri 40 mg in the morning and 20 mg in the evening on Tues, Thurs, Sat and Sun    glucose blood (ONETOUCH ULTRA) test strip Use to check blood sugars 4-5 times per day    insulin aspart (NOVOLOG FLEXPEN) 100 UNIT/ML FlexPen Inject 5 units into the skin with meals twice a day    insulin detemir (LEVEMIR FLEXTOUCH) 100 UNIT/ML FlexPen Inject 50 units into the skin every evening may titrate up to 60 units    Insulin Pen Needle (PEN NEEDLES) 31G X 6 MM MISC Please dispense as directed    lactulose (CHRONULAC) 10 GM/15ML solution Take 30 mLs (20 g total) by mouth 3 (three) times daily.  Multiple Vitamins-Minerals (MULTIVITAMIN WITH MINERALS) tablet Take 1 tablet by mouth daily.    nadolol (CORGARD) 40 MG tablet Take 1/2 (one half) tablet po daily    ondansetron (ZOFRAN) 8 MG tablet TAKE 1 TABLET BY MOUTH THREE TIMES DAILY AS NEEDED FOR NAUSEA    pantoprazole (PROTONIX) 40 MG tablet Take 1 tablet (40 mg total) by mouth 2 (two) times daily.    pramipexole (MIRAPEX) 0.25 MG tablet Take 2 tablets (0.5 mg total) by mouth at bedtime. TAKE 1 TABLET BY MOUTH NIGHTLY AT  LEAST  2  HOURS  BEFORE  BEDTIME  FOR  RESTLESS  LEGS (Patient taking differently: Take 0.25 mg by mouth at bedtime. TAKE 1 TABLET BY MOUTH NIGHTLY AT  LEAST  2  HOURS  BEFORE  BEDTIME  FOR  RESTLESS  LEGS) 12/18/2021: Takes one 0.25 mg tablet daily   spironolactone (ALDACTONE) 25 MG tablet Take 25 mg by mouth daily.    tadalafil (CIALIS) 20 MG tablet Take 0.5-1 tablets (10-20 mg total) by mouth every other day as needed for erectile dysfunction.    traZODone (DESYREL) 100 MG tablet Take 1 tablet (100 mg total) by mouth at bedtime as needed. for sleep    No facility-administered encounter medications on file as of 02/20/2022.    ALLERGIES:  Allergies  Allergen Reactions   Codeine  Nausea And Vomiting     PHYSICAL EXAM:  ECOG PERFORMANCE STATUS: 1 - Symptomatic but completely ambulatory  There were no vitals filed for this visit. There were no vitals filed for this visit. Physical Exam Constitutional:      Appearance: Normal appearance. He is obese.  HENT:     Head: Normocephalic and atraumatic.     Mouth/Throat:     Mouth: Mucous membranes are moist.  Eyes:     Extraocular Movements: Extraocular movements intact.     Pupils: Pupils are equal, round, and reactive to light.  Cardiovascular:     Rate and Rhythm: Normal rate and regular rhythm.     Pulses: Normal pulses.     Heart sounds: Normal heart sounds.  Pulmonary:     Effort: Pulmonary effort is normal.     Breath sounds: Normal breath sounds.  Abdominal:     General: Bowel sounds are normal.     Palpations: Abdomen is soft.     Tenderness: There is no abdominal tenderness.  Musculoskeletal:        General: No swelling.     Right lower leg: Edema (3+) present.     Left lower leg: Edema (3+) present.  Lymphadenopathy:     Cervical: No cervical adenopathy.  Skin:    General: Skin is warm and dry.  Neurological:     General: No focal deficit present.     Mental Status: He is alert and oriented to person, place, and time.  Psychiatric:        Mood and Affect: Mood normal.        Behavior: Behavior normal.      LABORATORY DATA:  I have reviewed the labs as listed.  CBC    Component Value Date/Time   WBC 6.8 01/11/2022 1200   RBC 3.15 (L) 01/11/2022 1200   HGB 9.8 (L) 01/11/2022 1200   HGB 7.8 (L) 10/24/2021 0859   HCT 29.1 (L) 01/11/2022 1200   HCT 23.9 (L) 10/24/2021 0859   PLT 138 (L) 01/11/2022 1200   PLT 103 (L) 10/24/2021 0859   MCV 92.4 01/11/2022 1200   MCV  87 10/24/2021 0859   MCH 31.1 01/11/2022 1200   MCHC 33.7 01/11/2022 1200   RDW 16.0 (H) 01/11/2022 1200   RDW 14.5 10/24/2021 0859   LYMPHSABS 0.9 12/20/2021 1005   LYMPHSABS 1.0 10/24/2021 0859   MONOABS 0.5  12/20/2021 1005   EOSABS 0.2 12/20/2021 1005   EOSABS 0.3 10/24/2021 0859   BASOSABS 0.0 12/20/2021 1005   BASOSABS 0.0 10/24/2021 0859      Latest Ref Rng & Units 01/11/2022   12:00 PM 12/21/2021    8:43 AM 12/20/2021   10:05 AM  CMP  Glucose 70 - 99 mg/dL 180  155  278   BUN 8 - 23 mg/dL 51  31  32   Creatinine 0.61 - 1.24 mg/dL 2.85  2.19  2.32   Sodium 135 - 145 mmol/L 136  136  134   Potassium 3.5 - 5.1 mmol/L 4.1  4.2  3.9   Chloride 98 - 111 mmol/L 103  100  101   CO2 22 - 32 mmol/L 25  24  26    Calcium 8.9 - 10.3 mg/dL 9.8  9.2  9.3   Total Protein 6.1 - 8.1 g/dL  6.9  7.4   Total Bilirubin 0.2 - 1.2 mg/dL  0.7  1.0   Alkaline Phos 38 - 126 U/L   124   AST 10 - 35 U/L  28  30   ALT 9 - 46 U/L  18  23     DIAGNOSTIC IMAGING:  I have independently reviewed the relevant imaging and discussed with the patient.  ASSESSMENT & PLAN: 1.  Normocytic anemia secondary to iron deficiency and CKD stage IIIb/IV -  Normocytic anemia since 2021, with significant worsening of his anemia in April 2023 (Hgb 5.6) during admission to hospital for GI bleeding.  Most recent CBC (12/13/2021) shows Hgb 8.6 with MCV 91.3. - Iron panel checked by nephrologist (11/15/2021) shows ferritin 56, iron saturation 14%, and TIBC 359. - Hematology work-up (12/20/2021): Reticulocytes 1.5% (hypoproliferative in the setting of moderate anemia) LDH normal. Ferritin 31, iron saturation 21%. Normal folate, copper. Normal B12 781 with mildly elevated methylmalonic acid 411. SPEP negative.  Immunofixation shows polyclonal gammopathy.   - Hospitalized in April 2023 for GI bleeding secondary to varices, requiring esophageal variceal banding and PRBC transfusion x 3.  He was hospitalized again in May 2023 with volume overload and symptomatic anemia requiring repeat EGD (10/31/2021) which showed portal gastropathy and grade 1 varices - he received Venofer 300 mg during hospitalization in May. - Follows with [facility] Neil Crouch, PA-C and Dr. Gala Romney) for his alcohol-related liver cirrhosis and GI bleeding. - He has been taking oral iron supplementation since June 2023 - Received IV Venofer 300 mg x 3 from 12/25/2021 through 01/08/2022 - Dr. Theador Hawthorne started patient on Procrit, with first dose scheduled for 02/22/2022 - Denies any recent hematemesis, melena, or hematochezia.   - Symptomatic with fatigue, pica, restless legs  - Labs today (02/20/2022): Hgb 8.4/MCV 95.0, ferritin 63, serum iron 36, iron saturation 12%.  Baseline CKD stage IIIb/IV with creatinine 2.35/GFR 30 - Differential diagnosis favors iron deficiency anemia secondary to GI blood loss as well as anemia secondary to his CKD stage IIIb - PLAN: Recommend additional IV iron with Venofer 300 mg x 3 - Patient can continue to take oral iron supplementation, recommended to take this at least 4 hours separate from his Protonix  - Start taking vitamin B12 500 mcg 3 times weekly (due  to elevated MMA as above)  - Procrit as per nephrologist (first dose scheduled for 02/22/2022) - Repeat labs and RTC in 2 months   2.  Thrombocytopenia and leukopenia (cirrhosis and splenomegaly) - Intermittent mild leukopenia since April 2023.  Most recent WBC (12/13/2021) 3.2 with normal differential.  He has had mild thrombocytopenia since June 2022, with most recent platelets (12/13/2021) at 45.   - Nephrology testing for hepatitis B, hepatitis C, and HIV in June 2023 was negative - Hematology work-up (12/20/2021): Reticulocytes 1.5% (hypoproliferative in the setting of moderate anemia) LDH normal. Ferritin 31, iron saturation 21%. Normal folate, copper. Normal B12 781 with mildly elevated methylmalonic acid 411. SPEP negative for M spike.  Immunofixation shows polyclonal gammopathy.  - No history of autoimmune or connective tissue disorder. - Patient has liver cirrhosis and splenomegaly - Follows with [facility]  Neil Crouch, PA-C and Dr. Gala Romney) for his alcohol-related liver cirrhosis and GI bleeding. - Most recent abdominal ultrasound (12/07/2020) showed splenomegaly measuring 14 cm with volume 678 cc. - Admits to easy bruising, no petechial rash.  Occasional mild epistaxis. - Labs today (02/20/2022): Platelets 102, at baseline - PLAN: Start taking vitamin B12 500 mcg 3 times weekly (due to elevated MMA as above)  - RTC in 2 months with repeat CBC. - No strong indication for treatment of leukopenia or thrombocytopenia at this time.  We will continue to monitor.   3.  Elevated free light chains - Labs obtained by nephrologist (11/15/2021) show elevated free serum light chains with elevated kappa 168.3, elevated lambda 92.6, and elevated ratio 1.82. - 24-hour urine (11/15/2021) showed elevated urine light chains with kappa 96.75 and lambda 9.02 with ratio 10.73.  Urine immunofixation showed polyclonal light chains but was negative for monoclonal immunoglobulin or Bence-Jones protein. - Hematology work-up (12/20/2021):  SPEP negative for M spike.  Immunofixation shows polyclonal gammopathy.  Elevated kappa light chains 138.0, elevated lambda 78.5, with elevated ratio 1.76 (in keeping with CKD) - He denies any new bone pain or B symptoms.   - PLAN: Elevated light chains secondary to CKD.  No further work-up of plasma cell dyscrasias at this time.   4.  Other history - PMH: CKD stage 3b, type 2 diabetes mellitus, anemia of chronic disease, hypertension, alcoholic liver cirrhosis, history of GI bleed from esophageal varices, restless legs, sleep apnea, and chronic diastolic CHF. 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 - SUBSTANCE: History of heavy alcohol use for 20 years, but has been sober since October 2022.  He smoked 0.5 PPD cigarettes for 30 years, but quit 10+ years ago.  He has history of drug use many years ago, but  denies any IV drug use. - FAMILY: No family history of blood abnormalities or cancer.  PLAN SUMMARY & DISPOSITION: Venofer 300 mg x 3 Labs and RTC in 2 months with same-day OV  All questions were answered. The patient knows to call the clinic with any problems, questions or concerns.  Medical decision making: Moderate  Time spent on visit: I spent 20 minutes counseling the patient face to face. The total time spent in the appointment was 30 minutes and more than 50% was on counseling.   Harriett Rush, PA-C  02/20/2022 11:44 AM

## 2022-02-20 ENCOUNTER — Encounter (HOSPITAL_COMMUNITY): Payer: Self-pay | Admitting: Hematology

## 2022-02-20 ENCOUNTER — Inpatient Hospital Stay (HOSPITAL_COMMUNITY): Payer: Medicare HMO | Attending: Physician Assistant | Admitting: Physician Assistant

## 2022-02-20 ENCOUNTER — Other Ambulatory Visit (HOSPITAL_COMMUNITY)
Admission: RE | Admit: 2022-02-20 | Discharge: 2022-02-20 | Disposition: A | Discharge status: Home / Self Care | Payer: Medicare HMO | Attending: Nephrology | Admitting: Nephrology

## 2022-02-20 ENCOUNTER — Inpatient Hospital Stay: Payer: Medicare HMO

## 2022-02-20 VITALS — BP 102/58 | HR 70 | Temp 98.0°F | Resp 18 | Ht 66.0 in | Wt 197.3 lb

## 2022-02-20 DIAGNOSIS — I130 Hypertensive heart and chronic kidney disease with heart failure and stage 1 through stage 4 chronic kidney disease, or unspecified chronic kidney disease: Secondary | ICD-10-CM | POA: Diagnosis not present

## 2022-02-20 DIAGNOSIS — N170 Acute kidney failure with tubular necrosis: Secondary | ICD-10-CM | POA: Insufficient documentation

## 2022-02-20 DIAGNOSIS — I5032 Chronic diastolic (congestive) heart failure: Secondary | ICD-10-CM | POA: Insufficient documentation

## 2022-02-20 DIAGNOSIS — E1122 Type 2 diabetes mellitus with diabetic chronic kidney disease: Secondary | ICD-10-CM | POA: Insufficient documentation

## 2022-02-20 DIAGNOSIS — D696 Thrombocytopenia, unspecified: Secondary | ICD-10-CM | POA: Diagnosis not present

## 2022-02-20 DIAGNOSIS — N1832 Chronic kidney disease, stage 3b: Secondary | ICD-10-CM

## 2022-02-20 DIAGNOSIS — D509 Iron deficiency anemia, unspecified: Secondary | ICD-10-CM | POA: Insufficient documentation

## 2022-02-20 DIAGNOSIS — D500 Iron deficiency anemia secondary to blood loss (chronic): Secondary | ICD-10-CM

## 2022-02-20 DIAGNOSIS — D72819 Decreased white blood cell count, unspecified: Secondary | ICD-10-CM | POA: Insufficient documentation

## 2022-02-20 DIAGNOSIS — D631 Anemia in chronic kidney disease: Secondary | ICD-10-CM | POA: Diagnosis not present

## 2022-02-20 DIAGNOSIS — Z87891 Personal history of nicotine dependence: Secondary | ICD-10-CM | POA: Insufficient documentation

## 2022-02-20 DIAGNOSIS — R69 Illness, unspecified: Secondary | ICD-10-CM | POA: Diagnosis not present

## 2022-02-20 DIAGNOSIS — N184 Chronic kidney disease, stage 4 (severe): Secondary | ICD-10-CM | POA: Insufficient documentation

## 2022-02-20 DIAGNOSIS — K7030 Alcoholic cirrhosis of liver without ascites: Secondary | ICD-10-CM | POA: Insufficient documentation

## 2022-02-20 LAB — COMPREHENSIVE METABOLIC PANEL
ALT: 18 U/L (ref 0–44)
AST: 25 U/L (ref 15–41)
Albumin: 2.9 g/dL — ABNORMAL LOW (ref 3.5–5.0)
Alkaline Phosphatase: 152 U/L — ABNORMAL HIGH (ref 38–126)
Anion gap: 11 (ref 5–15)
BUN: 43 mg/dL — ABNORMAL HIGH (ref 8–23)
CO2: 25 mmol/L (ref 22–32)
Calcium: 8.6 mg/dL — ABNORMAL LOW (ref 8.9–10.3)
Chloride: 98 mmol/L (ref 98–111)
Creatinine, Ser: 2.35 mg/dL — ABNORMAL HIGH (ref 0.61–1.24)
GFR, Estimated: 30 mL/min — ABNORMAL LOW (ref >60)
Glucose, Bld: 194 mg/dL — ABNORMAL HIGH (ref 70–99)
Potassium: 3.9 mmol/L (ref 3.5–5.1)
Sodium: 134 mmol/L — ABNORMAL LOW (ref 135–145)
Total Bilirubin: 0.8 mg/dL (ref 0.3–1.2)
Total Protein: 6.6 g/dL (ref 6.5–8.1)

## 2022-02-20 LAB — CBC WITH DIFFERENTIAL/PLATELET
Abs Immature Granulocytes: 0.03 10*3/uL (ref 0.00–0.07)
Basophils Absolute: 0 10*3/uL (ref 0.0–0.1)
Basophils Relative: 1 %
Eosinophils Absolute: 0.3 10*3/uL (ref 0.0–0.5)
Eosinophils Relative: 6 %
HCT: 24.8 % — ABNORMAL LOW (ref 39.0–52.0)
Hemoglobin: 8.4 g/dL — ABNORMAL LOW (ref 13.0–17.0)
Immature Granulocytes: 1 %
Lymphocytes Relative: 16 %
Lymphs Abs: 0.7 10*3/uL (ref 0.7–4.0)
MCH: 32.2 pg (ref 26.0–34.0)
MCHC: 33.9 g/dL (ref 30.0–36.0)
MCV: 95 fL (ref 80.0–100.0)
Monocytes Absolute: 0.6 10*3/uL (ref 0.1–1.0)
Monocytes Relative: 13 %
Neutro Abs: 2.8 10*3/uL (ref 1.7–7.7)
Neutrophils Relative %: 63 %
Platelets: 102 10*3/uL — ABNORMAL LOW (ref 150–400)
RBC: 2.61 MIL/uL — ABNORMAL LOW (ref 4.22–5.81)
RDW: 13.7 % (ref 11.5–15.5)
WBC: 4.3 10*3/uL (ref 4.0–10.5)
nRBC: 0 % (ref 0.0–0.2)

## 2022-02-20 LAB — IRON AND TIBC
Iron: 36 ug/dL — ABNORMAL LOW (ref 45–182)
Saturation Ratios: 12 % — ABNORMAL LOW (ref 17.9–39.5)
TIBC: 305 ug/dL (ref 250–450)
UIBC: 269 ug/dL

## 2022-02-20 LAB — FERRITIN: Ferritin: 63 ng/mL (ref 24–336)

## 2022-02-20 NOTE — Patient Instructions (Signed)
[facility] **   You were seen today by Tarri Abernethy PA-C for your follow-up visit.    ANEMIA: Your anemia is due to your iron deficiency and your chronic kidney disease. We will schedule you for IV iron x3 doses. Dr. Theador Hawthorne has started you on Procrit injections.  Continue these injections in addition to the IV iron.  LOW PLATELETS: Your low platelets are most likely due to your cirrhosis. Your labs did show mild abnormality of your B12 labs (elevated methylmalonic acid). You should start taking low-dose vitamin B12 500 mcg 3 days weekly (Monday/Wednesday/Friday)  FOLLOW-UP APPOINTMENT: Same-day labs and office visit in 2 months  ** Thank you for trusting me with your healthcare!  I strive to provide all of my patients with quality care at each visit.  If you receive a survey for this visit, I would be so grateful to you for taking the time to provide feedback.  Thank you in advance!  ~ Nakeysha Pasqual                   Dr. Derek Jack   &   Tarri Abernethy, PA-C   - - - - - - - - - - - - - - - - - -    Thank you for choosing [facility] to provide your oncology and hematology care.  To afford each patient quality time with our provider, please arrive at least 15 minutes before your scheduled appointment time.   If you have a lab appointment with the [facility] please come in thru the Main Entrance and check in at the main information desk.  You need to re-schedule your appointment should you arrive 10 or more minutes late.  We strive to give you quality time with our providers, and arriving late affects you and other patients whose appointments are after yours.  Also, if you no show three or more times for appointments you may be dismissed from the clinic at the providers discretion.     Again, thank you for choosing [facility].  Our hope is that  these requests will decrease the amount of time that you wait before being seen by our physicians.       _____________________________________________________________  Should you have questions after your visit to [facility], please contact our office at [phone] and follow the prompts.  Our office hours are 8:00 a.m. and 4:30 p.m. Monday - Friday.  Please note that voicemails left after 4:00 p.m. may not be returned until the following business day.  We are closed weekends and major holidays.  You do have access to a nurse 24-7, just call the main number to the clinic [phone] and do not press any options, hold on the line and a nurse will answer the phone.    For prescription refill requests, have your pharmacy contact our office and allow 72 hours.

## 2022-02-22 ENCOUNTER — Encounter (HOSPITAL_COMMUNITY): Payer: Self-pay

## 2022-02-22 ENCOUNTER — Encounter (HOSPITAL_COMMUNITY)
Admission: RE | Admit: 2022-02-22 | Discharge: 2022-02-22 | Disposition: A | Discharge status: Home / Self Care | Payer: Medicare HMO | Source: Ambulatory Visit | Attending: Nephrology | Admitting: Nephrology

## 2022-02-22 DIAGNOSIS — R410 Disorientation, unspecified: Secondary | ICD-10-CM | POA: Diagnosis not present

## 2022-02-22 DIAGNOSIS — D508 Other iron deficiency anemias: Secondary | ICD-10-CM | POA: Diagnosis not present

## 2022-02-22 DIAGNOSIS — E1129 Type 2 diabetes mellitus with other diabetic kidney complication: Secondary | ICD-10-CM | POA: Diagnosis not present

## 2022-02-22 DIAGNOSIS — N289 Disorder of kidney and ureter, unspecified: Secondary | ICD-10-CM

## 2022-02-22 DIAGNOSIS — E1122 Type 2 diabetes mellitus with diabetic chronic kidney disease: Secondary | ICD-10-CM | POA: Diagnosis not present

## 2022-02-22 DIAGNOSIS — K7460 Unspecified cirrhosis of liver: Secondary | ICD-10-CM | POA: Diagnosis not present

## 2022-02-22 DIAGNOSIS — R9431 Abnormal electrocardiogram [ECG] [EKG]: Secondary | ICD-10-CM | POA: Diagnosis not present

## 2022-02-22 DIAGNOSIS — R809 Proteinuria, unspecified: Secondary | ICD-10-CM | POA: Diagnosis not present

## 2022-02-22 DIAGNOSIS — R69 Illness, unspecified: Secondary | ICD-10-CM | POA: Diagnosis not present

## 2022-02-22 DIAGNOSIS — R4182 Altered mental status, unspecified: Secondary | ICD-10-CM | POA: Diagnosis not present

## 2022-02-22 DIAGNOSIS — D638 Anemia in other chronic diseases classified elsewhere: Secondary | ICD-10-CM | POA: Diagnosis not present

## 2022-02-22 DIAGNOSIS — K7682 Hepatic encephalopathy: Secondary | ICD-10-CM | POA: Diagnosis not present

## 2022-02-22 DIAGNOSIS — N189 Chronic kidney disease, unspecified: Secondary | ICD-10-CM | POA: Diagnosis not present

## 2022-02-22 DIAGNOSIS — I5033 Acute on chronic diastolic (congestive) heart failure: Secondary | ICD-10-CM | POA: Diagnosis not present

## 2022-02-22 LAB — POCT HEMOGLOBIN-HEMACUE: Hemoglobin: 9.2 g/dL — ABNORMAL LOW (ref 13.0–17.0)

## 2022-02-22 MED ORDER — EPOETIN ALFA 3000 UNIT/ML IJ SOLN
3000.0000 [IU] | Freq: Once | INTRAMUSCULAR | Status: AC
  Administered 2022-02-22: 3000 [IU] via SUBCUTANEOUS

## 2022-02-22 MED ORDER — EPOETIN ALFA 3000 UNIT/ML IJ SOLN
INTRAMUSCULAR | Status: AC
  Filled 2022-02-22: qty 1

## 2022-02-24 ENCOUNTER — Emergency Department (HOSPITAL_COMMUNITY): Payer: Medicare HMO

## 2022-02-24 ENCOUNTER — Other Ambulatory Visit: Payer: Self-pay

## 2022-02-24 ENCOUNTER — Inpatient Hospital Stay (HOSPITAL_COMMUNITY)
Admission: EM | Admit: 2022-02-24 | Discharge: 2022-02-27 | DRG: 442 | Disposition: A | Discharge status: Home / Self Care | Payer: Medicare HMO | Attending: Family Medicine | Admitting: Family Medicine

## 2022-02-24 ENCOUNTER — Encounter (HOSPITAL_COMMUNITY): Payer: Self-pay | Admitting: Emergency Medicine

## 2022-02-24 DIAGNOSIS — N1832 Chronic kidney disease, stage 3b: Secondary | ICD-10-CM | POA: Diagnosis present

## 2022-02-24 DIAGNOSIS — Z743 Need for continuous supervision: Secondary | ICD-10-CM | POA: Diagnosis not present

## 2022-02-24 DIAGNOSIS — I10 Essential (primary) hypertension: Secondary | ICD-10-CM | POA: Diagnosis present

## 2022-02-24 DIAGNOSIS — I129 Hypertensive chronic kidney disease with stage 1 through stage 4 chronic kidney disease, or unspecified chronic kidney disease: Secondary | ICD-10-CM | POA: Diagnosis present

## 2022-02-24 DIAGNOSIS — E119 Type 2 diabetes mellitus without complications: Secondary | ICD-10-CM

## 2022-02-24 DIAGNOSIS — Z79899 Other long term (current) drug therapy: Secondary | ICD-10-CM

## 2022-02-24 DIAGNOSIS — R9431 Abnormal electrocardiogram [ECG] [EKG]: Secondary | ICD-10-CM | POA: Diagnosis not present

## 2022-02-24 DIAGNOSIS — F1021 Alcohol dependence, in remission: Secondary | ICD-10-CM | POA: Diagnosis present

## 2022-02-24 DIAGNOSIS — Z8249 Family history of ischemic heart disease and other diseases of the circulatory system: Secondary | ICD-10-CM

## 2022-02-24 DIAGNOSIS — Z794 Long term (current) use of insulin: Secondary | ICD-10-CM

## 2022-02-24 DIAGNOSIS — R69 Illness, unspecified: Secondary | ICD-10-CM | POA: Diagnosis not present

## 2022-02-24 DIAGNOSIS — K7682 Hepatic encephalopathy: Principal | ICD-10-CM | POA: Diagnosis present

## 2022-02-24 DIAGNOSIS — I959 Hypotension, unspecified: Secondary | ICD-10-CM | POA: Diagnosis not present

## 2022-02-24 DIAGNOSIS — Z87891 Personal history of nicotine dependence: Secondary | ICD-10-CM

## 2022-02-24 DIAGNOSIS — R404 Transient alteration of awareness: Secondary | ICD-10-CM | POA: Diagnosis not present

## 2022-02-24 DIAGNOSIS — R410 Disorientation, unspecified: Principal | ICD-10-CM

## 2022-02-24 DIAGNOSIS — R55 Syncope and collapse: Secondary | ICD-10-CM | POA: Diagnosis not present

## 2022-02-24 DIAGNOSIS — E785 Hyperlipidemia, unspecified: Secondary | ICD-10-CM | POA: Diagnosis present

## 2022-02-24 DIAGNOSIS — N189 Chronic kidney disease, unspecified: Secondary | ICD-10-CM | POA: Diagnosis present

## 2022-02-24 DIAGNOSIS — E781 Pure hyperglyceridemia: Secondary | ICD-10-CM | POA: Diagnosis present

## 2022-02-24 DIAGNOSIS — N179 Acute kidney failure, unspecified: Secondary | ICD-10-CM | POA: Diagnosis present

## 2022-02-24 DIAGNOSIS — R4182 Altered mental status, unspecified: Secondary | ICD-10-CM | POA: Diagnosis not present

## 2022-02-24 DIAGNOSIS — G9341 Metabolic encephalopathy: Secondary | ICD-10-CM | POA: Diagnosis present

## 2022-02-24 DIAGNOSIS — E876 Hypokalemia: Secondary | ICD-10-CM | POA: Diagnosis present

## 2022-02-24 DIAGNOSIS — E1122 Type 2 diabetes mellitus with diabetic chronic kidney disease: Secondary | ICD-10-CM | POA: Diagnosis present

## 2022-02-24 DIAGNOSIS — E860 Dehydration: Secondary | ICD-10-CM | POA: Diagnosis present

## 2022-02-24 DIAGNOSIS — K7030 Alcoholic cirrhosis of liver without ascites: Secondary | ICD-10-CM | POA: Diagnosis present

## 2022-02-24 DIAGNOSIS — R062 Wheezing: Secondary | ICD-10-CM

## 2022-02-24 DIAGNOSIS — R609 Edema, unspecified: Secondary | ICD-10-CM | POA: Diagnosis not present

## 2022-02-24 DIAGNOSIS — E1165 Type 2 diabetes mellitus with hyperglycemia: Secondary | ICD-10-CM | POA: Diagnosis present

## 2022-02-24 LAB — COMPREHENSIVE METABOLIC PANEL
ALT: 19 U/L (ref 0–44)
AST: 25 U/L (ref 15–41)
Albumin: 3.2 g/dL — ABNORMAL LOW (ref 3.5–5.0)
Alkaline Phosphatase: 132 U/L — ABNORMAL HIGH (ref 38–126)
Anion gap: 12 (ref 5–15)
BUN: 41 mg/dL — ABNORMAL HIGH (ref 8–23)
CO2: 25 mmol/L (ref 22–32)
Calcium: 8.9 mg/dL (ref 8.9–10.3)
Chloride: 97 mmol/L — ABNORMAL LOW (ref 98–111)
Creatinine, Ser: 2.77 mg/dL — ABNORMAL HIGH (ref 0.61–1.24)
GFR, Estimated: 25 mL/min — ABNORMAL LOW (ref >60)
Glucose, Bld: 188 mg/dL — ABNORMAL HIGH (ref 70–99)
Potassium: 3.2 mmol/L — ABNORMAL LOW (ref 3.5–5.1)
Sodium: 134 mmol/L — ABNORMAL LOW (ref 135–145)
Total Bilirubin: 1 mg/dL (ref 0.3–1.2)
Total Protein: 7.1 g/dL (ref 6.5–8.1)

## 2022-02-24 LAB — BLOOD GAS, ARTERIAL
Acid-Base Excess: 8.1 mmol/L — ABNORMAL HIGH (ref 0.0–2.0)
Bicarbonate: 30.2 mmol/L — ABNORMAL HIGH (ref 20.0–28.0)
Drawn by: 10555
FIO2: 21 %
O2 Saturation: 98 %
Patient temperature: 37
pCO2 arterial: 33 mmHg (ref 32–48)
pH, Arterial: 7.57 — ABNORMAL HIGH (ref 7.35–7.45)
pO2, Arterial: 80 mmHg — ABNORMAL LOW (ref 83–108)

## 2022-02-24 LAB — CBC WITH DIFFERENTIAL/PLATELET
Abs Immature Granulocytes: 0.06 10*3/uL (ref 0.00–0.07)
Basophils Absolute: 0 10*3/uL (ref 0.0–0.1)
Basophils Relative: 1 %
Eosinophils Absolute: 0.2 10*3/uL (ref 0.0–0.5)
Eosinophils Relative: 4 %
HCT: 26.7 % — ABNORMAL LOW (ref 39.0–52.0)
Hemoglobin: 9.4 g/dL — ABNORMAL LOW (ref 13.0–17.0)
Immature Granulocytes: 1 %
Lymphocytes Relative: 13 %
Lymphs Abs: 0.8 10*3/uL (ref 0.7–4.0)
MCH: 32.2 pg (ref 26.0–34.0)
MCHC: 35.2 g/dL (ref 30.0–36.0)
MCV: 91.4 fL (ref 80.0–100.0)
Monocytes Absolute: 0.7 10*3/uL (ref 0.1–1.0)
Monocytes Relative: 11 %
Neutro Abs: 4.3 10*3/uL (ref 1.7–7.7)
Neutrophils Relative %: 70 %
Platelets: 129 10*3/uL — ABNORMAL LOW (ref 150–400)
RBC: 2.92 MIL/uL — ABNORMAL LOW (ref 4.22–5.81)
RDW: 13.6 % (ref 11.5–15.5)
WBC: 6.2 10*3/uL (ref 4.0–10.5)
nRBC: 0 % (ref 0.0–0.2)

## 2022-02-24 LAB — AMMONIA: Ammonia: 259 umol/L — ABNORMAL HIGH (ref 9–35)

## 2022-02-24 LAB — PROTIME-INR
INR: 1.3 — ABNORMAL HIGH (ref 0.8–1.2)
Prothrombin Time: 15.6 seconds — ABNORMAL HIGH (ref 11.4–15.2)

## 2022-02-24 LAB — ETHANOL: Alcohol, Ethyl (B): <10 mg/dL (ref <10)

## 2022-02-24 LAB — CBG MONITORING, ED: Glucose-Capillary: 174 mg/dL — ABNORMAL HIGH (ref 70–99)

## 2022-02-24 NOTE — ED Notes (Signed)
Resp called about abg

## 2022-02-24 NOTE — ED Notes (Signed)
Dexcom removed from right arm

## 2022-02-24 NOTE — ED Provider Notes (Signed)
[facility] EMERGENCY DEPARTMENT Provider Note   CSN: [id number] Arrival date & time: 02/24/22  2305     History {Add pertinent medical, surgical, social history, OB history to HPI:1} Chief Complaint  Patient presents with   Altered Mental Status   Level 5 caveat due to altered mental status Rohil Lesch is a 66 y.o. male.   Altered Mental Status Patient presents from home via EMS.  It is reported patient was altered and delirious.  There is a concern that patient has an elevated ammonia level due to history of cirrhosis.  Patient has been intermittently combative No other details on arrival     Home Medications Prior to Admission medications   Medication Sig Start Date End Date Taking? Authorizing Provider  albuterol (VENTOLIN HFA) 108 (90 Base) MCG/ACT inhaler Inhale 2 puffs into the lungs every 6 (six) hours as needed for wheezing. 10/24/21   Kathyrn Drown, MD  blood glucose meter kit and supplies KIT Dispense based on patient and insurance preference. Use up to BID as directed. 03/20/19   Nilda Simmer, NP  Continuous Blood Gluc Receiver (DEXCOM G6 RECEIVER) DEVI 1 Device by Does not apply route continuous. 01/05/22   Kathyrn Drown, MD  Continuous Blood Gluc Sensor (DEXCOM G6 SENSOR) MISC 1 Units by Does not apply route continuous. 01/05/22   Kathyrn Drown, MD  epoetin alfa (EPOGEN) 3000 UNIT/ML injection Inject into the skin. 02/07/22   [provider]  ferrous sulfate 325 (65 FE) MG EC tablet Take 1 tablet by mouth every morning. 11/27/21   [provider]  furosemide (LASIX) 20 MG tablet Take 40 mg by mouth 2 (two) times daily. 40 mg twice day on Mon, Wed, Fri 40 mg in the morning and 20 mg in the evening on Tues, Thurs, Sat and Sun 11/27/21 11/27/22  [provider]  glucose blood (ONETOUCH ULTRA) test strip Use to check blood sugars 4-5 times per day 01/08/22   Kathyrn Drown, MD  insulin aspart (NOVOLOG FLEXPEN) 100 UNIT/ML FlexPen Inject 5  units into the skin with meals twice a day 01/05/22   Kathyrn Drown, MD  insulin detemir (LEVEMIR FLEXTOUCH) 100 UNIT/ML FlexPen Inject 50 units into the skin every evening may titrate up to 60 units 01/23/22   Luking, Elayne Snare, MD  Insulin Pen Needle (PEN NEEDLES) 31G X 6 MM MISC Please dispense as directed 01/05/22   Kathyrn Drown, MD  insulin regular (NOVOLIN R) 100 units/mL injection Inject into the skin.    [provider]  lactulose (CHRONULAC) 10 GM/15ML solution Take 30 mLs (20 g total) by mouth 3 (three) times daily. 12/25/21   Mahala Menghini, PA-C  metolazone (ZAROXOLYN) 5 MG tablet Take by mouth. 02/14/22 05/15/22  [provider]  Multiple Vitamins-Minerals (MULTIVITAMIN WITH MINERALS) tablet Take 1 tablet by mouth daily.    [provider]  nadolol (CORGARD) 40 MG tablet Take 1/2 (one half) tablet po daily 10/24/21   Luking, Scott A, MD  ondansetron (ZOFRAN) 8 MG tablet TAKE 1 TABLET BY MOUTH THREE TIMES DAILY AS NEEDED FOR NAUSEA 12/15/21   Luking, Elayne Snare, MD  pantoprazole (PROTONIX) 40 MG tablet Take 1 tablet (40 mg total) by mouth 2 (two) times daily. 09/25/21   Kathyrn Drown, MD  pramipexole (MIRAPEX) 0.25 MG tablet Take 2 tablets (0.5 mg total) by mouth at bedtime. TAKE 1 TABLET BY MOUTH NIGHTLY AT  LEAST  2  HOURS  BEFORE  BEDTIME  FOR  RESTLESS  LEGS Patient taking differently: Take 0.25 mg by mouth at bedtime. TAKE 1 TABLET BY MOUTH NIGHTLY AT  LEAST  2  HOURS  BEFORE  BEDTIME  FOR  RESTLESS  LEGS 11/02/21   Johnson, Clanford L, MD  spironolactone (ALDACTONE) 25 MG tablet Take 25 mg by mouth daily. 11/27/21   [provider]  tadalafil (CIALIS) 20 MG tablet Take 0.5-1 tablets (10-20 mg total) by mouth every other day as needed for erectile dysfunction. 01/05/22   Kathyrn Drown, MD  traZODone (DESYREL) 100 MG tablet Take 1 tablet (100 mg total) by mouth at bedtime as needed. for sleep 08/15/21   Kathyrn Drown, MD      Allergies    Codeine     Review of Systems   Review of Systems  Unable to perform ROS: Mental status change    Physical Exam Updated Vital Signs BP 105/70   Pulse 66   Temp (!) 95.7 F (35.4 C) (Rectal)   Resp 11   Ht 1.676 m (5' 6" )   Wt 89.5 kg   SpO2 99%   BMI 31.85 kg/m  Physical Exam CONSTITUTIONAL: Ill-appearing HEAD: Normocephalic/atraumatic, no visible trauma EYES: EOMI/PERRL, no nystagmus Small bruise noted left eyelid ENMT: Food in mouth NECK: supple no meningeal signs CV: S1/S2 noted, no murmurs/rubs/gallops noted LUNGS: Lungs are clear to auscultation bilaterally, no apparent distress ABDOMEN: soft, obese GU: Normal external genitalia NEURO: Pt is unresponsive but will become combative to voice and pain He appears to move all extremities x4 EXTREMITIES: pulses normal/equal, no deformities SKIN: warm, color normal PSYCH: unable to assess  ED Results / Procedures / Treatments   Labs (all labs ordered are listed, but only abnormal results are displayed) Labs Reviewed  CBC WITH DIFFERENTIAL/PLATELET - Abnormal; Notable for the following components:      Result Value   RBC 2.92 (*)    Hemoglobin 9.4 (*)    HCT 26.7 (*)    Platelets 129 (*)    All other components within normal limits  CBG MONITORING, ED - Abnormal; Notable for the following components:   Glucose-Capillary 174 (*)    All other components within normal limits  COMPREHENSIVE METABOLIC PANEL  URINALYSIS, ROUTINE W REFLEX MICROSCOPIC  AMMONIA  BLOOD GAS, ARTERIAL  RAPID URINE DRUG SCREEN, HOSP PERFORMED  PROTIME-INR  ETHANOL    EKG EKG Interpretation  Date/Time:  Saturday February 24 2022 23:23:47 EDT Ventricular Rate:  72 PR Interval:  61 QRS Duration: 134 QT Interval:  486 QTC Calculation: 521 R Axis:   -24 Text Interpretation: Sinus rhythm Ventricular premature complex Short PR interval Left atrial enlargement Left bundle branch block Baseline wander in lead(s) V3 Interpretation limited secondary  to artifact Abnormal ekg Confirmed by Ripley Fraise [phone]) on 02/24/2022 11:40:35 PM  Radiology No results found.  Procedures .Critical Care  Performed by: Ripley Fraise, MD Authorized by: Ripley Fraise, MD   Critical care provider statement:    Critical care start time:  02/24/2022 11:43 PM   Critical care end time:  02/24/2022 11:43 PM   Critical care time was exclusive of:  Separately billable procedures and treating other patients   Critical care was necessary to treat or prevent imminent or life-threatening deterioration of the following conditions:  CNS failure or compromise, metabolic crisis and hepatic failure   Critical care was time spent personally by me on the following activities:  Re-evaluation of patient's condition, pulse oximetry, ordering and review of  radiographic studies, ordering and review of laboratory studies, ordering and performing treatments and interventions and review of old charts   I assumed direction of critical care for this patient from another provider in my specialty: no     Care discussed with: admitting provider     {Document cardiac monitor, telemetry assessment procedure when appropriate:1}  Medications Ordered in ED Medications - No data to display  ED Course/ Medical Decision Making/ A&P                           Medical Decision Making Amount and/or Complexity of Data Reviewed Labs: ordered. Radiology: ordered.   This patient presents to the ED for concern of altered mental status, this involves an extensive number of treatment options, and is a complaint that carries with it a high risk of complications and morbidity.  The differential diagnosis includes but is not limited to hepatic encephalopathy, UTI, sepsis, meningitis, pneumonia, CVA, intracranial hemorrhage  Comorbidities that complicate the patient evaluation: Patient's presentation is complicated by their history of cirrhosis and chronic kidney disease  Social  Determinants of Health: Patient's  history of alcohol use disorder   increases the complexity of managing their presentation  Additional history obtained: Records reviewed  oncology records reviewed  Lab Tests: I Ordered, and personally interpreted labs.  The pertinent results include:  ***  Imaging Studies ordered: I ordered imaging studies including CT scan head and X-ray chest   I independently visualized and interpreted imaging which showed *** I agree with the radiologist interpretation  Cardiac Monitoring: The patient was maintained on a cardiac monitor.  I personally viewed and interpreted the cardiac monitor which showed an underlying rhythm of:  {cardiac monitor:26849}  Medicines ordered and prescription drug management: I ordered medication including ***  for ***  Reevaluation of the patient after these medicines showed that the patient    {resolved/improved/worsened:23923::"improved"}  Test Considered: Patient is low risk / negative by ***, therefore do not feel that *** is indicated.  Critical Interventions:  ***  Consultations Obtained: I requested consultation with the {consultation:26851}, and discussed  findings as well as pertinent plan - they recommend: ***  Reevaluation: After the interventions noted above, I reevaluated the patient and found that they have :{resolved/improved/worsened:23923::"improved"}  Complexity of problems addressed: Patient's presentation is most consistent with  {MKLK:91791}  Disposition: After consideration of the diagnostic results and the patient's response to treatment,  I feel that the patent would benefit from {disposition:26850}.     {Document critical care time when appropriate:1} {Document review of labs and clinical decision tools ie heart score, Chads2Vasc2 etc:1}  {Document your independent review of radiology images, and any outside records:1} {Document your discussion with family members, caretakers, and with  consultants:1} {Document social determinants of health affecting pt's care:1} {Document your decision making why or why not admission, treatments were needed:1} Final Clinical Impression(s) / ED Diagnoses Final diagnoses:  Acute delirium    Rx / DC Orders ED Discharge Orders     None

## 2022-02-24 NOTE — ED Notes (Signed)
Warm blankets applied

## 2022-02-24 NOTE — ED Triage Notes (Signed)
Pt BIB [facility] from home due to being calle [place] for AMS. Wife concerned that pts ammonia level is elevated. Pt combative on arrival.

## 2022-02-25 ENCOUNTER — Encounter: Payer: Self-pay | Admitting: Family Medicine

## 2022-02-25 ENCOUNTER — Other Ambulatory Visit: Payer: Self-pay

## 2022-02-25 ENCOUNTER — Inpatient Hospital Stay (HOSPITAL_COMMUNITY): Payer: Medicare HMO

## 2022-02-25 ENCOUNTER — Encounter: Payer: Self-pay | Admitting: Internal Medicine

## 2022-02-25 DIAGNOSIS — N179 Acute kidney failure, unspecified: Secondary | ICD-10-CM | POA: Diagnosis not present

## 2022-02-25 DIAGNOSIS — E781 Pure hyperglyceridemia: Secondary | ICD-10-CM | POA: Diagnosis not present

## 2022-02-25 DIAGNOSIS — N189 Chronic kidney disease, unspecified: Secondary | ICD-10-CM | POA: Diagnosis not present

## 2022-02-25 DIAGNOSIS — R69 Illness, unspecified: Secondary | ICD-10-CM | POA: Diagnosis not present

## 2022-02-25 DIAGNOSIS — R4020 Unspecified coma: Secondary | ICD-10-CM | POA: Diagnosis not present

## 2022-02-25 DIAGNOSIS — E785 Hyperlipidemia, unspecified: Secondary | ICD-10-CM | POA: Diagnosis not present

## 2022-02-25 DIAGNOSIS — N1832 Chronic kidney disease, stage 3b: Secondary | ICD-10-CM | POA: Diagnosis not present

## 2022-02-25 DIAGNOSIS — R062 Wheezing: Secondary | ICD-10-CM | POA: Diagnosis not present

## 2022-02-25 DIAGNOSIS — E119 Type 2 diabetes mellitus without complications: Secondary | ICD-10-CM | POA: Diagnosis not present

## 2022-02-25 DIAGNOSIS — E876 Hypokalemia: Secondary | ICD-10-CM

## 2022-02-25 DIAGNOSIS — K7030 Alcoholic cirrhosis of liver without ascites: Secondary | ICD-10-CM

## 2022-02-25 DIAGNOSIS — I129 Hypertensive chronic kidney disease with stage 1 through stage 4 chronic kidney disease, or unspecified chronic kidney disease: Secondary | ICD-10-CM | POA: Diagnosis not present

## 2022-02-25 DIAGNOSIS — Z4682 Encounter for fitting and adjustment of non-vascular catheter: Secondary | ICD-10-CM | POA: Diagnosis not present

## 2022-02-25 DIAGNOSIS — Z87891 Personal history of nicotine dependence: Secondary | ICD-10-CM | POA: Diagnosis not present

## 2022-02-25 DIAGNOSIS — G9341 Metabolic encephalopathy: Secondary | ICD-10-CM | POA: Diagnosis not present

## 2022-02-25 DIAGNOSIS — R918 Other nonspecific abnormal finding of lung field: Secondary | ICD-10-CM | POA: Diagnosis not present

## 2022-02-25 DIAGNOSIS — E1122 Type 2 diabetes mellitus with diabetic chronic kidney disease: Secondary | ICD-10-CM | POA: Diagnosis not present

## 2022-02-25 DIAGNOSIS — R188 Other ascites: Secondary | ICD-10-CM | POA: Diagnosis not present

## 2022-02-25 DIAGNOSIS — K7682 Hepatic encephalopathy: Secondary | ICD-10-CM | POA: Diagnosis not present

## 2022-02-25 DIAGNOSIS — Z8249 Family history of ischemic heart disease and other diseases of the circulatory system: Secondary | ICD-10-CM | POA: Diagnosis not present

## 2022-02-25 DIAGNOSIS — E1165 Type 2 diabetes mellitus with hyperglycemia: Secondary | ICD-10-CM | POA: Diagnosis not present

## 2022-02-25 DIAGNOSIS — I517 Cardiomegaly: Secondary | ICD-10-CM | POA: Diagnosis not present

## 2022-02-25 DIAGNOSIS — F1021 Alcohol dependence, in remission: Secondary | ICD-10-CM | POA: Diagnosis not present

## 2022-02-25 DIAGNOSIS — Z794 Long term (current) use of insulin: Secondary | ICD-10-CM | POA: Diagnosis not present

## 2022-02-25 DIAGNOSIS — Z79899 Other long term (current) drug therapy: Secondary | ICD-10-CM | POA: Diagnosis not present

## 2022-02-25 DIAGNOSIS — E860 Dehydration: Secondary | ICD-10-CM | POA: Diagnosis not present

## 2022-02-25 DIAGNOSIS — R410 Disorientation, unspecified: Secondary | ICD-10-CM | POA: Diagnosis not present

## 2022-02-25 DIAGNOSIS — R4182 Altered mental status, unspecified: Secondary | ICD-10-CM | POA: Diagnosis not present

## 2022-02-25 LAB — URINALYSIS, ROUTINE W REFLEX MICROSCOPIC
Bilirubin Urine: NEGATIVE
Glucose, UA: NEGATIVE mg/dL
Hgb urine dipstick: NEGATIVE
Ketones, ur: NEGATIVE mg/dL
Leukocytes,Ua: NEGATIVE
Nitrite: NEGATIVE
Protein, ur: NEGATIVE mg/dL
Specific Gravity, Urine: 1.009 (ref 1.005–1.030)
pH: 7 (ref 5.0–8.0)

## 2022-02-25 LAB — HEMOGLOBIN A1C
Hgb A1c MFr Bld: 6.5 % — ABNORMAL HIGH (ref 4.8–5.6)
Mean Plasma Glucose: 139.85 mg/dL

## 2022-02-25 LAB — COMPREHENSIVE METABOLIC PANEL
ALT: 18 U/L (ref 0–44)
AST: 25 U/L (ref 15–41)
Albumin: 3.1 g/dL — ABNORMAL LOW (ref 3.5–5.0)
Alkaline Phosphatase: 125 U/L (ref 38–126)
Anion gap: 11 (ref 5–15)
BUN: 43 mg/dL — ABNORMAL HIGH (ref 8–23)
CO2: 25 mmol/L (ref 22–32)
Calcium: 8.9 mg/dL (ref 8.9–10.3)
Chloride: 97 mmol/L — ABNORMAL LOW (ref 98–111)
Creatinine, Ser: 2.7 mg/dL — ABNORMAL HIGH (ref 0.61–1.24)
GFR, Estimated: 25 mL/min — ABNORMAL LOW (ref >60)
Glucose, Bld: 239 mg/dL — ABNORMAL HIGH (ref 70–99)
Potassium: 3.2 mmol/L — ABNORMAL LOW (ref 3.5–5.1)
Sodium: 133 mmol/L — ABNORMAL LOW (ref 135–145)
Total Bilirubin: 1.5 mg/dL — ABNORMAL HIGH (ref 0.3–1.2)
Total Protein: 7.1 g/dL (ref 6.5–8.1)

## 2022-02-25 LAB — AMMONIA
Ammonia: 268 umol/L — ABNORMAL HIGH (ref 9–35)
Ammonia: 238 umol/L — ABNORMAL HIGH (ref 9–35)

## 2022-02-25 LAB — RAPID URINE DRUG SCREEN, HOSP PERFORMED
Amphetamines: NOT DETECTED
Barbiturates: NOT DETECTED
Benzodiazepines: NOT DETECTED
Cocaine: NOT DETECTED
Opiates: NOT DETECTED
Tetrahydrocannabinol: POSITIVE — AB

## 2022-02-25 LAB — HEMOGLOBIN AND HEMATOCRIT, BLOOD
HCT: 25.6 % — ABNORMAL LOW (ref 39.0–52.0)
Hemoglobin: 9 g/dL — ABNORMAL LOW (ref 13.0–17.0)

## 2022-02-25 LAB — CBC WITH DIFFERENTIAL/PLATELET
Abs Immature Granulocytes: 0.06 10*3/uL (ref 0.00–0.07)
Basophils Absolute: 0.1 10*3/uL (ref 0.0–0.1)
Basophils Relative: 1 %
Eosinophils Absolute: 0.2 10*3/uL (ref 0.0–0.5)
Eosinophils Relative: 3 %
HCT: 26.9 % — ABNORMAL LOW (ref 39.0–52.0)
Hemoglobin: 9.5 g/dL — ABNORMAL LOW (ref 13.0–17.0)
Immature Granulocytes: 1 %
Lymphocytes Relative: 12 %
Lymphs Abs: 0.8 10*3/uL (ref 0.7–4.0)
MCH: 32 pg (ref 26.0–34.0)
MCHC: 35.3 g/dL (ref 30.0–36.0)
MCV: 90.6 fL (ref 80.0–100.0)
Monocytes Absolute: 0.8 10*3/uL (ref 0.1–1.0)
Monocytes Relative: 12 %
Neutro Abs: 4.9 10*3/uL (ref 1.7–7.7)
Neutrophils Relative %: 71 %
Platelets: 145 10*3/uL — ABNORMAL LOW (ref 150–400)
RBC: 2.97 MIL/uL — ABNORMAL LOW (ref 4.22–5.81)
RDW: 13.4 % (ref 11.5–15.5)
WBC: 6.8 10*3/uL (ref 4.0–10.5)
nRBC: 0 % (ref 0.0–0.2)

## 2022-02-25 LAB — GLUCOSE, CAPILLARY
Glucose-Capillary: 235 mg/dL — ABNORMAL HIGH (ref 70–99)
Glucose-Capillary: 177 mg/dL — ABNORMAL HIGH (ref 70–99)
Glucose-Capillary: 155 mg/dL — ABNORMAL HIGH (ref 70–99)
Glucose-Capillary: 134 mg/dL — ABNORMAL HIGH (ref 70–99)

## 2022-02-25 LAB — MRSA NEXT GEN BY PCR, NASAL: MRSA by PCR Next Gen: NOT DETECTED

## 2022-02-25 LAB — MAGNESIUM: Magnesium: 1.7 mg/dL (ref 1.7–2.4)

## 2022-02-25 MED ORDER — LACTULOSE ENEMA
300.0000 mL | Freq: Two times a day (BID) | ORAL | Status: DC
  Filled 2022-02-25 (×2): qty 300

## 2022-02-25 MED ORDER — ORAL CARE MOUTH RINSE
15.0000 mL | OROMUCOSAL | Status: DC
  Administered 2022-02-25 – 2022-02-27 (×10): 15 mL via OROMUCOSAL

## 2022-02-25 MED ORDER — CHLORHEXIDINE GLUCONATE CLOTH 2 % EX PADS
6.0000 | MEDICATED_PAD | Freq: Every day | CUTANEOUS | Status: DC
  Administered 2022-02-25 – 2022-02-27 (×3): 6 via TOPICAL

## 2022-02-25 MED ORDER — POTASSIUM CHLORIDE 10 MEQ/100ML IV SOLN
10.0000 meq | INTRAVENOUS | Status: AC
  Administered 2022-02-25 (×2): 10 meq via INTRAVENOUS
  Filled 2022-02-25 (×2): qty 100

## 2022-02-25 MED ORDER — LACTULOSE ENEMA
300.0000 mL | Freq: Once | ORAL | Status: AC
  Administered 2022-02-25: 300 mL via RECTAL
  Filled 2022-02-25: qty 300

## 2022-02-25 MED ORDER — ALBUMIN HUMAN 25 % IV SOLN
25.0000 g | Freq: Four times a day (QID) | INTRAVENOUS | Status: AC
  Administered 2022-02-25 (×2): 25 g via INTRAVENOUS
  Filled 2022-02-25 (×2): qty 100

## 2022-02-25 MED ORDER — ORAL CARE MOUTH RINSE: 15.0000 mL | OROMUCOSAL | Status: DC | PRN

## 2022-02-25 MED ORDER — INSULIN ASPART 100 UNIT/ML IJ SOLN: 0.0000 [IU] | Freq: Every day | INTRAMUSCULAR | Status: DC

## 2022-02-25 MED ORDER — SPIRONOLACTONE 25 MG PO TABS
25.0000 mg | ORAL_TABLET | Freq: Every day | ORAL | Status: DC
  Administered 2022-02-26 – 2022-02-27 (×2): 25 mg via ORAL
  Filled 2022-02-25 (×2): qty 1

## 2022-02-25 MED ORDER — INSULIN DETEMIR 100 UNIT/ML ~~LOC~~ SOLN
40.0000 [IU] | Freq: Every day | SUBCUTANEOUS | Status: DC
  Administered 2022-02-25: 40 [IU] via SUBCUTANEOUS
  Filled 2022-02-25: qty 0.4

## 2022-02-25 MED ORDER — ALBUTEROL SULFATE HFA 108 (90 BASE) MCG/ACT IN AERS: 2.0000 | INHALATION_SPRAY | Freq: Four times a day (QID) | RESPIRATORY_TRACT | Status: DC | PRN

## 2022-02-25 MED ORDER — INSULIN ASPART 100 UNIT/ML IJ SOLN
0.0000 [IU] | Freq: Three times a day (TID) | INTRAMUSCULAR | Status: DC
  Administered 2022-02-25: 3 [IU] via SUBCUTANEOUS
  Administered 2022-02-25: 5 [IU] via SUBCUTANEOUS
  Administered 2022-02-25: 3 [IU] via SUBCUTANEOUS
  Administered 2022-02-27: 5 [IU] via SUBCUTANEOUS
  Administered 2022-02-27: 2 [IU] via SUBCUTANEOUS

## 2022-02-25 MED ORDER — LACTULOSE 10 GM/15ML PO SOLN: 30.0000 g | Freq: Three times a day (TID) | ORAL | Status: DC

## 2022-02-25 MED ORDER — OXYCODONE HCL 5 MG PO TABS: 5.0000 mg | ORAL_TABLET | ORAL | Status: DC | PRN

## 2022-02-25 MED ORDER — IPRATROPIUM-ALBUTEROL 0.5-2.5 (3) MG/3ML IN SOLN: 3.0000 mL | RESPIRATORY_TRACT | Status: DC | PRN

## 2022-02-25 MED ORDER — LACTULOSE ENEMA
300.0000 mL | Freq: Three times a day (TID) | ORAL | Status: DC
  Administered 2022-02-25 – 2022-02-26 (×3): 300 mL via RECTAL
  Filled 2022-02-25 (×9): qty 300

## 2022-02-25 MED ORDER — NADOLOL 20 MG PO TABS
20.0000 mg | ORAL_TABLET | Freq: Every day | ORAL | Status: DC
  Administered 2022-02-26 – 2022-02-27 (×2): 20 mg via ORAL
  Filled 2022-02-25 (×4): qty 1

## 2022-02-25 MED ORDER — IPRATROPIUM-ALBUTEROL 0.5-2.5 (3) MG/3ML IN SOLN
3.0000 mL | Freq: Four times a day (QID) | RESPIRATORY_TRACT | Status: DC
  Filled 2022-02-25: qty 3

## 2022-02-25 MED ORDER — SODIUM CHLORIDE 0.9 % IV SOLN: INTRAVENOUS | Status: DC

## 2022-02-25 MED ORDER — ONDANSETRON HCL 4 MG/2ML IJ SOLN
4.0000 mg | Freq: Once | INTRAMUSCULAR | Status: AC
  Administered 2022-02-25: 4 mg via INTRAVENOUS
  Filled 2022-02-25: qty 2

## 2022-02-25 MED ORDER — ONDANSETRON HCL 4 MG/2ML IJ SOLN: 4.0000 mg | Freq: Four times a day (QID) | INTRAMUSCULAR | Status: DC | PRN

## 2022-02-25 MED ORDER — POTASSIUM CHLORIDE 2 MEQ/ML IV SOLN
INTRAVENOUS | Status: DC
  Filled 2022-02-25 (×4): qty 1000

## 2022-02-25 MED ORDER — ONDANSETRON HCL 4 MG PO TABS: 4.0000 mg | ORAL_TABLET | Freq: Four times a day (QID) | ORAL | Status: DC | PRN

## 2022-02-25 MED ORDER — SODIUM CHLORIDE 0.9 % IV BOLUS
250.0000 mL | Freq: Once | INTRAVENOUS | Status: AC
  Administered 2022-02-25: 250 mL via INTRAVENOUS

## 2022-02-25 MED ORDER — ACETAMINOPHEN 650 MG RE SUPP: 650.0000 mg | Freq: Four times a day (QID) | RECTAL | Status: DC | PRN

## 2022-02-25 MED ORDER — ACETAMINOPHEN 325 MG PO TABS: 650.0000 mg | ORAL_TABLET | Freq: Four times a day (QID) | ORAL | Status: DC | PRN

## 2022-02-25 MED ORDER — HEPARIN SODIUM (PORCINE) 5000 UNIT/ML IJ SOLN
5000.0000 [IU] | Freq: Three times a day (TID) | INTRAMUSCULAR | Status: DC
  Administered 2022-02-25 – 2022-02-27 (×7): 5000 [IU] via SUBCUTANEOUS
  Filled 2022-02-25 (×7): qty 1

## 2022-02-25 MED ORDER — LACTULOSE 10 GM/15ML PO SOLN: 20.0000 g | Freq: Three times a day (TID) | ORAL | Status: DC

## 2022-02-25 MED ORDER — PRAMIPEXOLE DIHYDROCHLORIDE 0.25 MG PO TABS
0.2500 mg | ORAL_TABLET | Freq: Every day | ORAL | Status: DC
  Filled 2022-02-25 (×3): qty 1

## 2022-02-25 NOTE — Hospital Course (Signed)
Austin Morgan is a 66 y.o. male with medical history significant of alcohol cirrhosis, anxiety, depression, diabetes mellitus type 2, hyperlipidemia, hypertension .Marland Kitchen  Presents to the ED with a chief complaint of altered mental status.   Patient is lethargic at the time of my exam is not able to provide any history.  Earlier wife, who is a Designer, jewellery was here, and reported that the patient had become altered described as delirium.  He has been taking his lactulose as prescribed but there was concern about his ammonia level because of his altered mental status. Wife was also concerned that he was intravascularly depleted and requested fluid bolus.  In the ED he was given a 250 mill bolus.  His mentation did not improve.  Admission rectal lactulose ordered.  Patient is some peripheral edema, but no other complaints or alarming physical findings.   Of note patient is documented to have alcohol dependence in remission.  ED Temp 95.7, HR 66-71, RR,  10-17, blood pressure 105/69-122/71, satting at 100% pH 7.57, PCO2 33, PO2 80 No leukocytosis with a white blood cell count of 6.2, hemoglobin 9.4 Chemistry reveals a hypokalemia 3.2, elevated BUN of 41, elevated creatinine 2.77, hyperglycemia and elevated ammonia 259 CT head shows no acute intracranial process Chest x-ray shows cardiomegaly with mild distended pulmonary vasculature, interstitial prominence bilateral, possible edema or infiltrate EKG shows a heart rate of 72, sinus rhythm, QTc 521 Alcohol levels undetectable UDS positive for marijuana Admission requested for acute metabolic encephalopathy secondary to elevated ammonia

## 2022-02-25 NOTE — Plan of Care (Signed)
Patient remains in CCU. GCS = 5. Patient still protecting airway for now. Stem reflexes grossly intact. Irregular respiratory pattern unchanged from this morning. High risk for intubation.   Problem: Education: Goal: Ability to describe self-care measures that may prevent or decrease complications (Diabetes Survival Skills Education) will improve 02/25/2022 1939 by Jesse Sans, RN Outcome: Not Progressing 02/25/2022 [phone] by Jesse Sans, RN Outcome: Not Progressing Goal: Individualized Educational Video(s) 02/25/2022 1939 by Jesse Sans, RN Outcome: Not Progressing 02/25/2022 0642 by Jesse Sans, RN Outcome: Not Progressing   Problem: Coping: Goal: Ability to adjust to condition or change in health will improve 02/25/2022 1939 by Jesse Sans, RN Outcome: Not Progressing 02/25/2022 0642 by Jesse Sans, RN Outcome: Not Progressing   Problem: Fluid Volume: Goal: Ability to maintain a balanced intake and output will improve 02/25/2022 1939 by Jesse Sans, RN Outcome: Not Progressing 02/25/2022 0642 by Jesse Sans, RN Outcome: Not Progressing   Problem: Health Behavior/Discharge Planning: Goal: Ability to identify and utilize available resources and services will improve 02/25/2022 1939 by Jesse Sans, RN Outcome: Not Progressing 02/25/2022 0642 by Jesse Sans, RN Outcome: Not Progressing Goal: Ability to manage health-related needs will improve 02/25/2022 1939 by Jesse Sans, RN Outcome: Not Progressing 02/25/2022 0642 by Jesse Sans, RN Outcome: Not Progressing   Problem: Metabolic: Goal: Ability to maintain appropriate glucose levels will improve 02/25/2022 1939 by Jesse Sans, RN Outcome: Not Progressing 02/25/2022 0642 by Jesse Sans, RN Outcome: Not Progressing   Problem: Nutritional: Goal: Maintenance of adequate nutrition will improve 02/25/2022 1939 by Jesse Sans, RN Outcome: Not Progressing 02/25/2022 0642 by Jesse Sans, RN Outcome: Not Progressing Goal: Progress toward achieving an optimal weight will improve 02/25/2022 1939 by Jesse Sans, RN Outcome: Not Progressing 02/25/2022 0642 by Jesse Sans, RN Outcome: Not Progressing   Problem: Skin Integrity: Goal: Risk for impaired skin integrity will decrease 02/25/2022 1939 by Jesse Sans, RN Outcome: Not Progressing 02/25/2022 0642 by Jesse Sans, RN Outcome: Not Progressing   Problem: Tissue Perfusion: Goal: Adequacy of tissue perfusion will improve 02/25/2022 1939 by Jesse Sans, RN Outcome: Not Progressing 02/25/2022 0642 by Jesse Sans, RN Outcome: Not Progressing   Problem: Education: Goal: Knowledge of General Education information will improve Description: Including pain rating scale, medication(s)/side effects and non-pharmacologic comfort measures 02/25/2022 1939 by Jesse Sans, RN Outcome: Not Progressing 02/25/2022 0642 by Jesse Sans, RN Outcome: Not Progressing   Problem: Health Behavior/Discharge Planning: Goal: Ability to manage health-related needs will improve 02/25/2022 1939 by Jesse Sans, RN Outcome: Not Progressing 02/25/2022 0642 by Jesse Sans, RN Outcome: Not Progressing   Problem: Clinical Measurements: Goal: Ability to maintain clinical measurements within normal limits will improve 02/25/2022 1939 by Jesse Sans, RN Outcome: Not Progressing 02/25/2022 0642 by Jesse Sans, RN Outcome: Not Progressing Goal: Will remain free from infection 02/25/2022 1939 by Jesse Sans, RN Outcome: Not Progressing 02/25/2022 0642 by Jesse Sans, RN Outcome: Not Progressing Goal: Diagnostic test results will improve 02/25/2022 1939 by Jesse Sans, RN Outcome: Not Progressing 02/25/2022 0642 by Jesse Sans, RN Outcome: Not Progressing Goal: Respiratory complications will improve 02/25/2022 1939 by Jesse Sans, RN Outcome: Not Progressing 02/25/2022 0642 by Jesse Sans, RN Outcome: Not Progressing Goal: Cardiovascular complication will be avoided 02/25/2022 1939 by Jesse Sans, RN Outcome: Not Progressing 02/25/2022 [phone]  by Jesse Sans, RN Outcome: Not Progressing   Problem: Activity: Goal: Risk for activity intolerance will decrease 02/25/2022 1939 by Jesse Sans, RN Outcome: Not Progressing 02/25/2022 [phone] by Jesse Sans, RN Outcome: Not Progressing   Problem: Nutrition: Goal: Adequate nutrition will be maintained 02/25/2022 1939 by Jesse Sans, RN Outcome: Not Progressing 02/25/2022 0642 by Jesse Sans, RN Outcome: Not Progressing   Problem: Coping: Goal: Level of anxiety will decrease 02/25/2022 1939 by Jesse Sans, RN Outcome: Not Progressing 02/25/2022 0642 by Jesse Sans, RN Outcome: Not Progressing   Problem: Elimination: Goal: Will not experience complications related to bowel motility 02/25/2022 1939 by Jesse Sans, RN Outcome: Not Progressing 02/25/2022 0642 by Jesse Sans, RN Outcome: Not Progressing Goal: Will not experience complications related to urinary retention 02/25/2022 1939 by Jesse Sans, RN Outcome: Not Progressing 02/25/2022 0642 by Jesse Sans, RN Outcome: Not Progressing   Problem: Pain Managment: Goal: General experience of comfort will improve 02/25/2022 1939 by Jesse Sans, RN Outcome: Not Progressing 02/25/2022 0642 by Jesse Sans, RN Outcome: Not Progressing   Problem: Safety: Goal: Ability to remain free from injury will improve 02/25/2022 1939 by Jesse Sans, RN Outcome: Not Progressing 02/25/2022 0642 by Jesse Sans, RN Outcome: Not Progressing   Problem: Skin Integrity: Goal: Risk for impaired skin integrity will decrease 02/25/2022 1939 by Jesse Sans, RN Outcome: Not Progressing 02/25/2022 0642 by Jesse Sans, RN Outcome: Not Progressing

## 2022-02-25 NOTE — ED Notes (Signed)
Flexiseal unclamped

## 2022-02-25 NOTE — Plan of Care (Signed)
Patient admitted to CCU from ED. GCS = 9. Known hepatic disease.   Problem: Education: Goal: Ability to describe self-care measures that may prevent or decrease complications (Diabetes Survival Skills Education) will improve Outcome: Not Progressing Goal: Individualized Educational Video(s) Outcome: Not Progressing   Problem: Coping: Goal: Ability to adjust to condition or change in health will improve Outcome: Not Progressing   Problem: Fluid Volume: Goal: Ability to maintain a balanced intake and output will improve Outcome: Not Progressing   Problem: Health Behavior/Discharge Planning: Goal: Ability to identify and utilize available resources and services will improve Outcome: Not Progressing Goal: Ability to manage health-related needs will improve Outcome: Not Progressing   Problem: Metabolic: Goal: Ability to maintain appropriate glucose levels will improve Outcome: Not Progressing   Problem: Nutritional: Goal: Maintenance of adequate nutrition will improve Outcome: Not Progressing Goal: Progress toward achieving an optimal weight will improve Outcome: Not Progressing   Problem: Skin Integrity: Goal: Risk for impaired skin integrity will decrease Outcome: Not Progressing   Problem: Tissue Perfusion: Goal: Adequacy of tissue perfusion will improve Outcome: Not Progressing   Problem: Education: Goal: Knowledge of General Education information will improve Description: Including pain rating scale, medication(s)/side effects and non-pharmacologic comfort measures Outcome: Not Progressing   Problem: Health Behavior/Discharge Planning: Goal: Ability to manage health-related needs will improve Outcome: Not Progressing   Problem: Clinical Measurements: Goal: Ability to maintain clinical measurements within normal limits will improve Outcome: Not Progressing Goal: Will remain free from infection Outcome: Not Progressing Goal: Diagnostic test results will  improve Outcome: Not Progressing Goal: Respiratory complications will improve Outcome: Not Progressing Goal: Cardiovascular complication will be avoided Outcome: Not Progressing   Problem: Activity: Goal: Risk for activity intolerance will decrease Outcome: Not Progressing   Problem: Nutrition: Goal: Adequate nutrition will be maintained Outcome: Not Progressing   Problem: Coping: Goal: Level of anxiety will decrease Outcome: Not Progressing   Problem: Elimination: Goal: Will not experience complications related to bowel motility Outcome: Not Progressing Goal: Will not experience complications related to urinary retention Outcome: Not Progressing   Problem: Pain Managment: Goal: General experience of comfort will improve Outcome: Not Progressing   Problem: Safety: Goal: Ability to remain free from injury will improve Outcome: Not Progressing   Problem: Skin Integrity: Goal: Risk for impaired skin integrity will decrease Outcome: Not Progressing

## 2022-02-25 NOTE — Assessment & Plan Note (Signed)
Continue albuterol as needed 

## 2022-02-25 NOTE — Assessment & Plan Note (Addendum)
-  History of cirrhosis with normal INR at 1.3, elevated ammonia at 259 >>> 268 -Initiating lactulose rectally -We will monitor mental status, and ammonia level -Continue spironolactone -Continue to monitor

## 2022-02-25 NOTE — Progress Notes (Addendum)
PROGRESS NOTE    Patient: Austin Morgan                            PCP: Kathyrn Drown, MD                    DOB: 1956-04-11            DOA: 02/24/2022 [id number]             DOS: 02/25/2022, 2:27 PM   LOS: 0 days   Date of Service: The patient was seen and examined on 02/25/2022  Subjective:   The patient was seen and examined this morning. Hemodynamically stable, satting 95% on room air Sleeping deeply comfortably unable to arouse at this point  Patient was seen and examined with nursing staff at bedside  Brief Narrative:    Austin Morgan is a 66 y.o. male with medical history significant of alcohol cirrhosis, anxiety, depression, diabetes mellitus type 2, hyperlipidemia, hypertension .Marland Kitchen  Presents to the ED with a chief complaint of altered mental status.   Patient is lethargic at the time of my exam is not able to provide any history.  Earlier wife, who is a Designer, jewellery was here, and reported that the patient had become altered described as delirium.  He has been taking his lactulose as prescribed but there was concern about his ammonia level because of his altered mental status. Wife was also concerned that he was intravascularly depleted and requested fluid bolus.  In the ED he was given a 250 mill bolus.  His mentation did not improve.  Admission rectal lactulose ordered.  Patient is some peripheral edema, but no other complaints or alarming physical findings.   Of note patient is documented to have alcohol dependence in remission.  ED Temp 95.7, HR 66-71, RR,  10-17, blood pressure 105/69-122/71, satting at 100% pH 7.57, PCO2 33, PO2 80 No leukocytosis with a white blood cell count of 6.2, hemoglobin 9.4 Chemistry reveals a hypokalemia 3.2, elevated BUN of 41, elevated creatinine 2.77, hyperglycemia and elevated ammonia 259 CT head shows no acute intracranial process Chest x-ray shows cardiomegaly with mild distended pulmonary vasculature, interstitial prominence  bilateral, possible edema or infiltrate EKG shows a heart rate of 72, sinus rhythm, QTc 521 Alcohol levels undetectable UDS positive for marijuana Admission requested for acute metabolic encephalopathy secondary to elevated ammonia    Assessment & Plan:   Principal Problem:   Hepatic encephalopathy (HCC) Active Problems:   Acute kidney injury superimposed on chronic kidney disease stage 3b   Diabetes mellitus without complication (HCC)   Hypertension   Alcoholic cirrhosis (HCC)   Hypokalemia   Wheeze     Assessment and Plan: * Hepatic encephalopathy (HCC)  - Ammonia at 259 >>> 268 -Initiating lactulose rectally -We will monitor mental status, and ammonia level -Continue spironolactone -  - INR normal 1.3  - Anticipate mental status improvement with treatment of hyperammonemia - Continue to monitor  Acute kidney injury superimposed on chronic kidney disease stage 3b - History of CKD stage IIIb --acute on chronic kidney disease -  Increased from 2.35>> 2.77 >> 2.70 - Avoid nephrotoxic agents when possible - It is possible the patient is intravascularly depleted so we will continue very gentle maintenance fluids. -   If creatinine trends up then may need to reassess plan and start with Lasix -Continue IV fluid hydration for now  Diabetes mellitus without complication ([place]) -  Last hemoglobin A1c 6.0 - Patient takes 50 units of basal insulin at baseline - Continue with reduced dose of basal insulin and sliding scale coverage - Continue to monitor  Wheeze Start DuoNeb scheduled Continue albuterol as needed  Hypokalemia - Potassium 3.2 - In the setting of AKI we will do gentle replacement -Repleted with IV fluid - Trending   Alcoholic cirrhosis ([place]) -History of cirrhosis with normal INR at 1.3, elevated ammonia at 259 >>> 268 -Initiating lactulose rectally -We will monitor mental status, and ammonia level -Continue spironolactone -Continue to  monitor  Hypertension - Continue nadolol and spironolactone    ---------------------------------------------------------------------------------------------------------------------------------------------  DVT prophylaxis:  heparin injection 5,000 Units Start: 02/25/22 0600 SCDs Start: 02/25/22 0315   Code Status:   Code Status: Full Code  Family Communication:  The above findings and plan of care has been discussed with patient (and family)  in detail,  they expressed understanding and agreement of above. -Advance care planning has been discussed.   Admission status:   Status is: Inpatient Remains inpatient appropriate because: Continue treatment with significant encephalopathy, unresponsiveness, needing IV fluid, rectal lactulose,     Procedures:   No admission procedures for hospital encounter.   Antimicrobials:  Anti-infectives (From admission, onward)    None        Medication:   Chlorhexidine Gluconate Cloth  6 each Topical Q0600   heparin  5,000 Units Subcutaneous Q8H   insulin aspart  0-15 Units Subcutaneous TID WC   insulin aspart  0-5 Units Subcutaneous QHS   insulin detemir  40 Units Subcutaneous QHS   lactulose  300 mL Rectal TID   nadolol  20 mg Oral Daily   mouth rinse  15 mL Mouth Rinse 4 times per day   pramipexole  0.25 mg Oral QHS   spironolactone  25 mg Oral Daily    acetaminophen **OR** acetaminophen, ipratropium-albuterol, ondansetron **OR** ondansetron (ZOFRAN) IV, mouth rinse, oxyCODONE   Objective:   Vitals:   02/25/22 1100 02/25/22 1200 02/25/22 1300 02/25/22 1400  BP: (!) 110/51 (!) 124/59 (!) 121/50 (!) 133/58  Pulse: 80 80 81 77  Resp: '17 19 16 '$ (!) 21  Temp:      TempSrc:      SpO2: 97% 100% 100% 99%  Weight:      Height:        Intake/Output Summary (Last 24 hours) at 02/25/2022 1427 Last data filed at 02/25/2022 1343 Gross per 24 hour  Intake 835.31 ml  Output 2425 ml  Net -1589.69 ml   Filed Weights    02/24/22 2316  Weight: 89.5 kg     Examination:   Physical Exam  Constitution: Sleeping comfortably, not able to arouse at this point, hemodynamically stable HEENT:        Normocephalic, PERRL, otherwise with in Normal limits  Chest:         Chest symmetric Cardio vascular:  S1/S2, RRR, No murmure, No Rubs or Gallops  pulmonary: Clear to auscultation bilaterally, respirations unlabored, negative wheezes / crackles Abdomen: Soft, non-tender, non-distended, bowel sounds,no masses, no organomegaly Muscular skeletal: Limited exam - in bed,  Neuro: Limited exam, sleeping deeply,-comfortable  Extremities: No pitting edema lower extremities, +2 pulses  Skin: Dry, warm to touch, negative for any Rashes, No open wounds Wounds: per nursing documentation   ------------------------------------------------------------------------------------------------------------------------------------------    LABs:     Latest Ref Rng & Units 02/25/2022   11:54 AM 02/25/2022    6:14 AM 02/24/2022   11:00 PM  CBC  WBC 4.0 - 10.5 K/uL  6.8  6.2   Hemoglobin 13.0 - 17.0 g/dL 9.0  9.5  9.4   Hematocrit 39.0 - 52.0 % 25.6  26.9  26.7   Platelets 150 - 400 K/uL  145  129       Latest Ref Rng & Units 02/25/2022    6:14 AM 02/24/2022   11:00 PM 02/20/2022    8:57 AM  CMP  Glucose 70 - 99 mg/dL 239  188  194   BUN 8 - 23 mg/dL 43  41  43   Creatinine 0.61 - 1.24 mg/dL 2.70  2.77  2.35   Sodium 135 - 145 mmol/L 133  134  134   Potassium 3.5 - 5.1 mmol/L 3.2  3.2  3.9   Chloride 98 - 111 mmol/L 97  97  98   CO2 22 - 32 mmol/L '25  25  25   '$ Calcium 8.9 - 10.3 mg/dL 8.9  8.9  8.6   Total Protein 6.5 - 8.1 g/dL 7.1  7.1  6.6   Total Bilirubin 0.3 - 1.2 mg/dL 1.5  1.0  0.8   Alkaline Phos 38 - 126 U/L 125  132  152   AST 15 - 41 U/L '25  25  25   '$ ALT 0 - 44 U/L '18  19  18        '$ Micro Results No results found for this or any previous visit (from the past 240 hour(s)).  Radiology Reports DG CHEST PORT  1 VIEW  Result Date: 02/25/2022 CLINICAL DATA:  NG tube placement EXAM: PORTABLE CHEST 1 VIEW COMPARISON:  02/24/2022, 11:37 a.m. FINDINGS: No esophagogastric tube is identified within the chest or abdomen. Coiled tubing material projects over the lower neck, presumably within the oropharynx. Cardiomegaly with diffuse bilateral interstitial pulmonary opacity. IMPRESSION: No esophagogastric tube is identified within the chest or abdomen. Coiled tubing material projects over the lower neck, presumably within the oropharynx. Recommend repositioning. These results will be called to the ordering clinician or representative by the Radiologist Assistant, and communication documented in the PACS or [employer]. Electronically Signed   By: Delanna Ahmadi M.D.   On: 02/25/2022 14:10   CT HEAD WO CONTRAST  Result Date: 02/25/2022 CLINICAL DATA:  Delirium, altered mental status. EXAM: CT HEAD WITHOUT CONTRAST TECHNIQUE: Contiguous axial images were obtained from the base of the skull through the vertex without intravenous contrast. RADIATION DOSE REDUCTION: This exam was performed according to the departmental dose-optimization program which includes automated exposure control, adjustment of the mA and/or kV according to patient size and/or use of iterative reconstruction technique. COMPARISON:  None Available. FINDINGS: Brain: No acute intracranial hemorrhage, midline shift or mass effect. No extra-axial fluid collection. Diffuse atrophy is noted. Periventricular white matter hypodensities are noted bilaterally. No hydrocephalus. Hyperdense bands are present in the cerebellar hemispheres bilaterally, likely calcifications. Vascular: No hyperdense vessel or unexpected calcification. Skull: Normal. Negative for fracture or focal lesion. Sinuses/Orbits: No acute finding. Other: None. IMPRESSION: 1. No acute intracranial process. 2. Atrophy with chronic microvascular ischemic changes. Electronically Signed   By: Brett Fairy M.D.   On: 02/25/2022 00:35   DG Chest Port 1 View  Result Date: 02/24/2022 CLINICAL DATA:  Altered mental status. EXAM: PORTABLE CHEST 1 VIEW COMPARISON:  11/29/2021. FINDINGS: Heart is enlarged the mediastinal contour is stable. There is atherosclerotic calcification of the aorta. Pulmonary vasculature is mildly distended. Interstitial prominence is noted bilaterally. No effusion or pneumothorax. Cervical spinal  fusion hardware is noted. No acute osseous abnormality. IMPRESSION: 1. Cardiomegaly with mildly distended pulmonary vasculature. 2. Interstitial prominence bilaterally, possible edema or infiltrate. Electronically Signed   By: Brett Fairy M.D.   On: 02/24/2022 23:49    SIGNED: Deatra James, MD, FHM. [facility],  Pager (please use [email or web address] to page/text) Please use Epic Secure Chat for non-urgent communication (7AM-7PM)  If 7PM-7AM, please contact night-coverage [email or web address], 02/25/2022, 2:27 PM

## 2022-02-25 NOTE — Progress Notes (Signed)
Bedside nursing staff and [facility] attempted 3 times to place an NG tube in patient for potential of lactulose being given that way. Each time unsuccessful as either the tube kinks, meets resistance, or isn't visible on x-ray. MD made aware and stated to hold off for now since patient is at risk for bleeding. Will continue to monitor.

## 2022-02-25 NOTE — Assessment & Plan Note (Addendum)
-   Ammonia at 259 >>> 268 -Initiating lactulose rectally -We will monitor mental status, and ammonia level -Continue spironolactone -  - INR normal 1.3  - Anticipate mental status improvement with treatment of hyperammonemia - Continue to monitor

## 2022-02-25 NOTE — Assessment & Plan Note (Addendum)
-   Improving  - history of CKD stage IIIb --acute on chronic kidney disease -  Increased from 2.35>> 2.77 >> 2.70 >> 2.41 >> 2.10 - Avoid nephrotoxic agents when possible - Status post gentle IV fluid hydration - Home medication of Lasix has been adjusted reduced from 80 to 40 mg daily, continue Zaroxolyn -Follow-up with primary nephrologist to monitor kidney function closely, monitor fluid balance

## 2022-02-25 NOTE — Assessment & Plan Note (Signed)
-   Continue nadolol and spironolactone

## 2022-02-25 NOTE — Assessment & Plan Note (Signed)
-   Last hemoglobin A1c 6.0 - Patient takes 50 units of basal insulin at baseline -As p.o. intake improves, resuming home regimen insulin

## 2022-02-25 NOTE — Assessment & Plan Note (Addendum)
-   Was monitored and repleted IV and orally

## 2022-02-25 NOTE — H&P (Signed)
History and Physical    Patient: Austin Morgan MRN:[id number] DOB: 03/03/1956 DOA: 02/24/2022 DOS: the patient was seen and examined on 02/25/2022 PCP: Luking, Scott A, MD  Patient coming from: Home  Chief Complaint:  Chief Complaint  Patient presents with   Altered Mental Status   HPI: Austin Morgan is a 66 y.o. male with medical history significant of alcohol cirrhosis, anxiety, depression, diabetes mellitus type 2, hyperlipidemia, hypertension and more presents to the ED with a chief complaint of altered mental status.  Patient is lethargic at the time of my exam is not able to provide any history.  Earlier wife, who is a nurse practitioner was here, and reported that the patient had become altered described as delirium.  He has been taking his lactulose as prescribed but there was concern about his ammonia level because of his altered mental status.  Wife was also concerned that he was intravascularly depleted and requested fluid bolus.  In the ED he was given a 250 mill bolus.  His mentation did not improve.  Admission rectal lactulose ordered.  Patient is some peripheral edema, but no other complaints or alarming physical findings.  Of note patient is documented to have alcohol dependence in remission. Review of Systems: unable to review all systems due to the inability of the patient to answer questions. Past Medical History:  Diagnosis Date   Anxiety    Arthritis    Depression    Diabetes mellitus without complication (HCC)    ED (erectile dysfunction)    Gout    H/O ETOH abuse    Hyperlipidemia    Hypertension    Hypertriglyceridemia    Iron deficiency anemia due to chronic blood loss 12/20/2021   PONV (postoperative nausea and vomiting)    Sleep apnea    Past Surgical History:  Procedure Laterality Date   CARPAL TUNNEL RELEASE Bilateral    COLONOSCOPY  2010   Dr. Dora Brodie: mild diverticulosis, next colonoscopy 2020   COLONOSCOPY WITH PROPOFOL N/A 01/21/2020   Rourk: 9  polyps removed, multiple tubular adenomas.  Next colonoscopy in 3 years.   ELBOW SURGERY     ESOPHAGEAL BANDING  09/28/2021   Procedure: ESOPHAGEAL BANDING;  Surgeon: Rourk, Kirke M, MD;  [facility]: [facility];  Service: Endoscopy;;   ESOPHAGOGASTRODUODENOSCOPY (EGD) WITH PROPOFOL N/A 01/21/2020   Rourk: normal   ESOPHAGOGASTRODUODENOSCOPY (EGD) WITH PROPOFOL N/A 09/28/2021   Procedure: ESOPHAGOGASTRODUODENOSCOPY (EGD) WITH PROPOFOL;  Surgeon: Rourk, Justyn M, MD;  Location: [facility];  Service: Endoscopy;  Laterality: N/A;  3:00pm   ESOPHAGOGASTRODUODENOSCOPY (EGD) WITH PROPOFOL N/A 10/31/2021   Procedure: ESOPHAGOGASTRODUODENOSCOPY (EGD) WITH PROPOFOL;  Surgeon: Castaneda Mayorga, Daniel, MD;  Location: [facility];  Service: Gastroenterology;  Laterality: N/A;   NECK SURGERY     c4   POLYPECTOMY  01/21/2020   Procedure: POLYPECTOMY;  Surgeon: Rourk, Daryon M, MD;  Location: [facility];  Service: Endoscopy;;  colon   Social History:  reports that he quit smoking about 5 years ago. His smoking use included cigarettes. He has a 36.00 pack-year smoking history. He has been exposed to tobacco smoke. He has never used smokeless tobacco. He reports that he does not currently use alcohol. He reports that he does not use drugs.  Allergies  Allergen Reactions   Codeine Nausea And Vomiting    Family History  Problem Relation Age of Onset   Heart attack Father    Heart Problems Brother        Had   open heart surgery   Colon cancer Neg Hx     Prior to Admission medications   Medication Sig Start Date End Date Taking? Authorizing Provider  albuterol (VENTOLIN HFA) 108 (90 Base) MCG/ACT inhaler Inhale 2 puffs into the lungs every 6 (six) hours as needed for wheezing. 10/24/21   Kathyrn Drown, MD  blood glucose meter kit and supplies KIT Dispense based on patient and insurance preference. Use up to BID as directed. 03/20/19   Nilda Simmer, NP  Continuous Blood Gluc Receiver (DEXCOM  G6 RECEIVER) DEVI 1 Device by Does not apply route continuous. 01/05/22   Kathyrn Drown, MD  Continuous Blood Gluc Sensor (DEXCOM G6 SENSOR) MISC 1 Units by Does not apply route continuous. 01/05/22   Kathyrn Drown, MD  epoetin alfa (EPOGEN) 3000 UNIT/ML injection Inject into the skin. 02/07/22   [provider]  ferrous sulfate 325 (65 FE) MG EC tablet Take 1 tablet by mouth every morning. 11/27/21   [provider]  furosemide (LASIX) 20 MG tablet Take 40 mg by mouth 2 (two) times daily. 40 mg twice day on Mon, Wed, Fri 40 mg in the morning and 20 mg in the evening on Tues, Thurs, Sat and Sun 11/27/21 11/27/22  [provider]  glucose blood (ONETOUCH ULTRA) test strip Use to check blood sugars 4-5 times per day 01/08/22   Kathyrn Drown, MD  insulin aspart (NOVOLOG FLEXPEN) 100 UNIT/ML FlexPen Inject 5 units into the skin with meals twice a day 01/05/22   Kathyrn Drown, MD  insulin detemir (LEVEMIR FLEXTOUCH) 100 UNIT/ML FlexPen Inject 50 units into the skin every evening may titrate up to 60 units 01/23/22   Luking, Elayne Snare, MD  Insulin Pen Needle (PEN NEEDLES) 31G X 6 MM MISC Please dispense as directed 01/05/22   Kathyrn Drown, MD  insulin regular (NOVOLIN R) 100 units/mL injection Inject into the skin.    [provider]  lactulose (CHRONULAC) 10 GM/15ML solution Take 30 mLs (20 g total) by mouth 3 (three) times daily. 12/25/21   Mahala Menghini, PA-C  metolazone (ZAROXOLYN) 5 MG tablet Take by mouth. 02/14/22 05/15/22  [provider]  Multiple Vitamins-Minerals (MULTIVITAMIN WITH MINERALS) tablet Take 1 tablet by mouth daily.    [provider]  nadolol (CORGARD) 40 MG tablet Take 1/2 (one half) tablet po daily 10/24/21   Luking, Scott A, MD  ondansetron (ZOFRAN) 8 MG tablet TAKE 1 TABLET BY MOUTH THREE TIMES DAILY AS NEEDED FOR NAUSEA 12/15/21   Luking, Elayne Snare, MD  pantoprazole (PROTONIX) 40 MG tablet Take 1 tablet (40 mg total) by mouth 2 (two)  times daily. 09/25/21   Kathyrn Drown, MD  pramipexole (MIRAPEX) 0.25 MG tablet Take 2 tablets (0.5 mg total) by mouth at bedtime. TAKE 1 TABLET BY MOUTH NIGHTLY AT  LEAST  2  HOURS  BEFORE  BEDTIME  FOR  RESTLESS  LEGS Patient taking differently: Take 0.25 mg by mouth at bedtime. TAKE 1 TABLET BY MOUTH NIGHTLY AT  LEAST  2  HOURS  BEFORE  BEDTIME  FOR  RESTLESS  LEGS 11/02/21   Johnson, Clanford L, MD  spironolactone (ALDACTONE) 25 MG tablet Take 25 mg by mouth daily. 11/27/21   [provider]  tadalafil (CIALIS) 20 MG tablet Take 0.5-1 tablets (10-20 mg total) by mouth every other day as needed for erectile dysfunction. 01/05/22   Kathyrn Drown, MD  traZODone (DESYREL) 100 MG tablet  Take 1 tablet (100 mg total) by mouth at bedtime as needed. for sleep 08/15/21   Luking, Scott A, MD    Physical Exam: Vitals:   02/25/22 0200 02/25/22 0215 02/25/22 0230 02/25/22 0300  BP: 111/64  110/61 109/62  Pulse: 66 66 64 71  Resp:   10 12  Temp:    (!) 96 F (35.6 C)  TempSrc:      SpO2: 99% 97% 98% 95%  Weight:      Height:       1.  General: Patient lying supine in bed,  no acute distress   2. Psychiatric: Lethargic and oriented not answering questions or following commands   3. Neurologic: Not answering questions or following commands, protecting airway   4. HEENMT:  Head is atraumatic, normocephalic, pupils reactive to light, neck is supple, trachea is midline, mucous membranes are moist   5. Respiratory : Some wheezing on auscultation without rhonchi, rales, no cyanosis   6. Cardiovascular : Heart rate normal, rhythm is regular, murmur present, rubs or gallops, peripheral edema present, peripheral pulses palpated   7. Gastrointestinal:  Abdomen is soft minimally distended, nontender to palpation bowel sounds active, no masses or organomegaly palpated   8. Skin:  Skin is warm, dry and intact without rashes, acute lesions, or ulcers on limited exam   9.Musculoskeletal:   No acute deformities or trauma, no asymmetry in tone, peripheral edema present, peripheral pulses palpated, no tenderness to palpation in the extremities  Data Reviewed: In the ED Temp 95.7, heart rate 66-71, respiratory rate 10-17, blood pressure 105/69-122/71, satting at 100% pH 7.57, PCO2 33, PO2 80 No leukocytosis with a white blood cell count of 6.2, hemoglobin 9.4 Chemistry reveals a hypokalemia 3.2, elevated BUN of 41, elevated creatinine 2.77, hyperglycemia and elevated ammonia 259 CT head shows no acute intracranial process Chest x-ray shows cardiomegaly with mild distended pulmonary vasculature, interstitial prominence bilateral, possible edema or infiltrate EKG shows a heart rate of 72, sinus rhythm, QTc 521 Alcohol levels undetectable UDS and UA pending Admission requested for acute metabolic encephalopathy secondary to elevated ammonia  Assessment and Plan: * Hepatic encephalopathy (HCC) - Ammonia 259 - INR normal 1.3 - Rectal lactulose for tonight, hopefully patient will be able to take home dose of p.o. lactulose in the a.m. - Anticipate mental status improvement with treatment of hyperammonemia - Continue to monitor  Acute kidney injury superimposed on chronic kidney disease stage 3b - Increased from 2.35>> 3.77 - Avoid nephrotoxic agents when possible - It is possible the patient is intravascularly depleted so we will continue very gentle maintenance fluids.  If creatinine trends up then may need to reassess plan and start with Lasix  Diabetes mellitus without complication (HCC) - Last hemoglobin A1c 6.0 - Patient takes 50 units of basal insulin at baseline - Continue with reduced dose of basal insulin and sliding scale coverage - Continue to monitor  Wheeze Start DuoNeb scheduled Continue albuterol as needed  Hypokalemia - Potassium 3.2 - In the setting of AKI we will do gentle replacement -2 rounds of IV potassium ordered - Trend in the a.m.  Alcoholic  cirrhosis (HCC) -History of cirrhosis with normal INR at 1.3, elevated ammonia at 259 -Continue spironolactone -Continue to monitor  Hypertension - Continue nadolol and spironolactone      Advance Care Planning:   Code Status: Full Code   Consults: None  Family Communication: No family at bedside  Severity of Illness: The appropriate patient status for this   patient is INPATIENT. Inpatient status is judged to be reasonable and necessary in order to provide the required intensity of service to ensure the patient's safety. The patient's presenting symptoms, physical exam findings, and initial radiographic and laboratory data in the context of their chronic comorbidities is felt to place them at high risk for further clinical deterioration. Furthermore, it is not anticipated that the patient will be medically stable for discharge from the hospital within 2 midnights of admission.   * I certify that at the point of admission it is my clinical judgment that the patient will require inpatient hospital care spanning beyond 2 midnights from the point of admission due to high intensity of service, high risk for further deterioration and high frequency of surveillance required.*  Author: Asia B Zierle-Ghosh, DO 02/25/2022 5:19 AM  For on call review [email or web address].  

## 2022-02-26 ENCOUNTER — Inpatient Hospital Stay (HOSPITAL_COMMUNITY): Payer: Medicare HMO

## 2022-02-26 ENCOUNTER — Inpatient Hospital Stay: Payer: Medicare HMO

## 2022-02-26 DIAGNOSIS — K7682 Hepatic encephalopathy: Secondary | ICD-10-CM | POA: Diagnosis not present

## 2022-02-26 LAB — COMPREHENSIVE METABOLIC PANEL
ALT: 17 U/L (ref 0–44)
AST: 32 U/L (ref 15–41)
Albumin: 3.2 g/dL — ABNORMAL LOW (ref 3.5–5.0)
Alkaline Phosphatase: 101 U/L (ref 38–126)
Anion gap: 10 (ref 5–15)
BUN: 39 mg/dL — ABNORMAL HIGH (ref 8–23)
CO2: 22 mmol/L (ref 22–32)
Calcium: 8.9 mg/dL (ref 8.9–10.3)
Chloride: 108 mmol/L (ref 98–111)
Creatinine, Ser: 2.41 mg/dL — ABNORMAL HIGH (ref 0.61–1.24)
GFR, Estimated: 29 mL/min — ABNORMAL LOW (ref >60)
Glucose, Bld: 65 mg/dL — ABNORMAL LOW (ref 70–99)
Potassium: 2.8 mmol/L — ABNORMAL LOW (ref 3.5–5.1)
Sodium: 140 mmol/L (ref 135–145)
Total Bilirubin: 1.1 mg/dL (ref 0.3–1.2)
Total Protein: 6.6 g/dL (ref 6.5–8.1)

## 2022-02-26 LAB — GLUCOSE, CAPILLARY
Glucose-Capillary: 140 mg/dL — ABNORMAL HIGH (ref 70–99)
Glucose-Capillary: 48 mg/dL — ABNORMAL LOW (ref 70–99)
Glucose-Capillary: 97 mg/dL (ref 70–99)
Glucose-Capillary: 56 mg/dL — ABNORMAL LOW (ref 70–99)
Glucose-Capillary: 84 mg/dL (ref 70–99)
Glucose-Capillary: 195 mg/dL — ABNORMAL HIGH (ref 70–99)

## 2022-02-26 LAB — CBC
HCT: 24.6 % — ABNORMAL LOW (ref 39.0–52.0)
Hemoglobin: 8.4 g/dL — ABNORMAL LOW (ref 13.0–17.0)
MCH: 31.8 pg (ref 26.0–34.0)
MCHC: 34.1 g/dL (ref 30.0–36.0)
MCV: 93.2 fL (ref 80.0–100.0)
Platelets: 115 10*3/uL — ABNORMAL LOW (ref 150–400)
RBC: 2.64 MIL/uL — ABNORMAL LOW (ref 4.22–5.81)
RDW: 13.7 % (ref 11.5–15.5)
WBC: 6.9 10*3/uL (ref 4.0–10.5)
nRBC: 0 % (ref 0.0–0.2)

## 2022-02-26 LAB — PROTIME-INR
INR: 1.3 — ABNORMAL HIGH (ref 0.8–1.2)
Prothrombin Time: 16.1 seconds — ABNORMAL HIGH (ref 11.4–15.2)

## 2022-02-26 LAB — AMMONIA: Ammonia: 56 umol/L — ABNORMAL HIGH (ref 9–35)

## 2022-02-26 MED ORDER — MAGNESIUM SULFATE 2 GM/50ML IV SOLN
2.0000 g | Freq: Once | INTRAVENOUS | Status: AC
  Administered 2022-02-26: 2 g via INTRAVENOUS
  Filled 2022-02-26: qty 50

## 2022-02-26 MED ORDER — POTASSIUM CHLORIDE IN NACL 40-0.9 MEQ/L-% IV SOLN: INTRAVENOUS | Status: DC

## 2022-02-26 MED ORDER — PRAMIPEXOLE DIHYDROCHLORIDE 0.25 MG PO TABS
0.2500 mg | ORAL_TABLET | Freq: Three times a day (TID) | ORAL | Status: DC
  Administered 2022-02-26 – 2022-02-27 (×2): 0.25 mg via ORAL
  Filled 2022-02-26 (×5): qty 1

## 2022-02-26 MED ORDER — PRAMIPEXOLE DIHYDROCHLORIDE 0.25 MG PO TABS
0.2500 mg | ORAL_TABLET | Freq: Every day | ORAL | Status: DC
  Administered 2022-02-26: 0.25 mg via ORAL
  Filled 2022-02-26 (×2): qty 1

## 2022-02-26 MED ORDER — DEXTROSE 50 % IV SOLN
25.0000 g | INTRAVENOUS | Status: AC
  Administered 2022-02-26: 25 g via INTRAVENOUS

## 2022-02-26 MED ORDER — DEXTROSE 50 % IV SOLN
INTRAVENOUS | Status: AC
  Filled 2022-02-26: qty 50

## 2022-02-26 MED ORDER — LACTULOSE 10 GM/15ML PO SOLN
30.0000 g | Freq: Three times a day (TID) | ORAL | Status: DC
  Administered 2022-02-26 – 2022-02-27 (×3): 30 g via ORAL
  Filled 2022-02-26 (×3): qty 60

## 2022-02-26 MED ORDER — ORAL CARE MOUTH RINSE: 15.0000 mL | OROMUCOSAL | Status: DC | PRN

## 2022-02-26 MED ORDER — INSULIN DETEMIR 100 UNIT/ML ~~LOC~~ SOLN
20.0000 [IU] | Freq: Every day | SUBCUTANEOUS | Status: DC
  Administered 2022-02-26: 20 [IU] via SUBCUTANEOUS
  Filled 2022-02-26 (×2): qty 0.2

## 2022-02-26 MED ORDER — POTASSIUM CHLORIDE 10 MEQ/100ML IV SOLN
10.0000 meq | INTRAVENOUS | Status: AC
  Administered 2022-02-26 (×4): 10 meq via INTRAVENOUS
  Filled 2022-02-26 (×4): qty 100

## 2022-02-26 NOTE — Progress Notes (Signed)
PROGRESS NOTE    Patient: Austin Morgan                            PCP: Kathyrn Drown, MD                    DOB: May 28, 1956            DOA: 02/24/2022 ZES:[id number]             DOS: 02/26/2022, 11:19 AM   LOS: 1 day   Date of Service: The patient was seen and examined on 02/26/2022  Subjective:   The patient was seen and examined this morning, stable much more awake, following some commands, Hemodynamically stable.  Failed multiple attempts of NG tube placement yesterday in ICU -Continue lactulose enema 3 times daily--ammonia level down to 56   Brief Narrative:    Farron Watrous is a 66 y.o. male with medical history significant of alcohol cirrhosis, anxiety, depression, diabetes mellitus type 2, hyperlipidemia, hypertension .Marland Kitchen  Presents to the ED with a chief complaint of altered mental status.   Patient is lethargic at the time of my exam is not able to provide any history.  Earlier wife, who is a Designer, jewellery was here, and reported that the patient had become altered described as delirium.  He has been taking his lactulose as prescribed but there was concern about his ammonia level because of his altered mental status. Wife was also concerned that he was intravascularly depleted and requested fluid bolus.  In the ED he was given a 250 mill bolus.  His mentation did not improve.  Admission rectal lactulose ordered.  Patient is some peripheral edema, but no other complaints or alarming physical findings.   Of note patient is documented to have alcohol dependence in remission.  ED Temp 95.7, HR 66-71, RR,  10-17, blood pressure 105/69-122/71, satting at 100% pH 7.57, PCO2 33, PO2 80 No leukocytosis with a white blood cell count of 6.2, hemoglobin 9.4 Chemistry reveals a hypokalemia 3.2, elevated BUN of 41, elevated creatinine 2.77, hyperglycemia and elevated ammonia 259 CT head shows no acute intracranial process Chest x-ray shows cardiomegaly with mild distended pulmonary  vasculature, interstitial prominence bilateral, possible edema or infiltrate EKG shows a heart rate of 72, sinus rhythm, QTc 521 Alcohol levels undetectable UDS positive for marijuana Admission requested for acute metabolic encephalopathy secondary to elevated ammonia    Assessment & Plan:   Principal Problem:   Hepatic encephalopathy (HCC) Active Problems:   Acute kidney injury superimposed on chronic kidney disease stage 3b   Diabetes mellitus without complication (HCC)   Hypertension   Alcoholic cirrhosis (HCC)   Hypokalemia   Wheeze     Assessment and Plan: * Hepatic encephalopathy (HCC)  - Ammonia at 259 >>> 268 >> 56 -Initiating lactulose rectally--continuing lactulose enema 3 mg 3 times daily -Failed NG tube placement x3 on 02/25/2022 -Mentation is improving -as ammonia level trending down  -We will monitor mental status, and ammonia level >> -Continue spironolactone   - INR normal 1.3 - Continue to monitor  Acute kidney injury superimposed on chronic kidney disease stage 3b - Improving  - history of CKD stage IIIb --acute on chronic kidney disease -  Increased from 2.35>> 2.77 >> 2.70 >> 2.41 - Avoid nephrotoxic agents when possible - continue very gentle maintenance fluids. -   If creatinine trends up then may need to reassess plan and  start with Lasix -Continue IV fluid hydration for now  Diabetes mellitus without complication (HCC) - Last hemoglobin A1c 6.0 - Patient takes 50 units of basal insulin at baseline - Continue with reduced dose of basal insulin and sliding scale coverage - Continue to monitor  Wheeze Start DuoNeb scheduled Continue albuterol as needed  Hypokalemia - Potassium 3.2 >> repleting with IV fluids, and 4 runs of 10 metabolic KCl - In the setting of AKI we will do gentle replacement -Monitoring magnesium levels  - Trending   Alcoholic cirrhosis ([place]) -History of cirrhosis with normal INR at 1.3, elevated ammonia at 259 >>>  268 >>> 56 -More awake today -Initiating lactulose rectally-enema 30 g 3 times daily -Failed NG tube placement x3 on 02/25/2022  -We will monitor mental status, and ammonia level -Continue spironolactone -Continue to monitor  Hypertension - Continue nadolol and spironolactone    ---------------------------------------------------------------------------------------------------------------------------------------------  DVT prophylaxis:  heparin injection 5,000 Units Start: 02/25/22 0600 SCDs Start: 02/25/22 0315   Code Status:   Code Status: Full Code  Family Communication:  The above findings and plan of care has been discussed with patient (and family)  in detail,  they expressed understanding and agreement of above. -Advance care planning has been discussed.   Admission status:   Status is: Inpatient Remains inpatient appropriate because: Continue treatment with significant encephalopathy, unresponsiveness, needing IV fluid, rectal lactulose,     Procedures:   No admission procedures for hospital encounter.   Antimicrobials:  Anti-infectives (From admission, onward)    None        Medication:   Chlorhexidine Gluconate Cloth  6 each Topical Q0600   heparin  5,000 Units Subcutaneous Q8H   insulin aspart  0-15 Units Subcutaneous TID WC   insulin detemir  20 Units Subcutaneous QHS   lactulose  300 mL Rectal TID   nadolol  20 mg Oral Daily   mouth rinse  15 mL Mouth Rinse 4 times per day   pramipexole  0.25 mg Oral QHS   spironolactone  25 mg Oral Daily    acetaminophen **OR** acetaminophen, ipratropium-albuterol, ondansetron **OR** ondansetron (ZOFRAN) IV, oxyCODONE   Objective:   Vitals:   02/26/22 0726 02/26/22 0800 02/26/22 0900 02/26/22 1000  BP:  123/60 (!) 100/50 (!) 110/49  Pulse:  86 85 81  Resp:  (!) '21 20 16  '$ Temp: 99.5 F (37.5 C)     TempSrc: Axillary     SpO2:  98% 99% 99%  Weight:      Height:        Intake/Output Summary  (Last 24 hours) at 02/26/2022 1119 Last data filed at 02/26/2022 1020 Gross per 24 hour  Intake 4296.26 ml  Output 5050 ml  Net -753.74 ml   Filed Weights   02/24/22 2316 02/26/22 0500  Weight: 89.5 kg 96.3 kg     Examination:     General:  More awake this a.m., following,, cooperative still somnolent  AAO x 1,  no distress;   HEENT:  Normocephalic, PERRL, otherwise with in Normal limits   Neuro:  CNII-XII intact. , normal motor and sensation, reflexes intact   Lungs:   Clear to auscultation BL, Respirations unlabored,  No wheezes / crackles  Cardio:    S1/S2, RRR, No murmure, No Rubs or Gallops   Abdomen:  Soft, non-tender, bowel sounds active all four quadrants, no guarding or peritoneal signs.  Muscular  skeletal:  Limited exam -global generalized weaknesses - in bed, able to move all 4  extremities,   2+ pulses,  symmetric, No pitting edema  Skin:  Dry, warm to touch, negative for any Rashes,  Wounds: Please see nursing documentation          ------------------------------------------------------------------------------------------------------------------------------------------    LABs:     Latest Ref Rng & Units 02/26/2022    5:22 AM 02/25/2022   11:54 AM 02/25/2022    6:14 AM  CBC  WBC 4.0 - 10.5 K/uL 6.9   6.8   Hemoglobin 13.0 - 17.0 g/dL 8.4  9.0  9.5   Hematocrit 39.0 - 52.0 % 24.6  25.6  26.9   Platelets 150 - 400 K/uL 115   145       Latest Ref Rng & Units 02/26/2022    5:22 AM 02/25/2022    6:14 AM 02/24/2022   11:00 PM  CMP  Glucose 70 - 99 mg/dL 65  239  188   BUN 8 - 23 mg/dL 39  43  41   Creatinine 0.61 - 1.24 mg/dL 2.41  2.70  2.77   Sodium 135 - 145 mmol/L 140  133  134   Potassium 3.5 - 5.1 mmol/L 2.8  3.2  3.2   Chloride 98 - 111 mmol/L 108  97  97   CO2 22 - 32 mmol/L '22  25  25   '$ Calcium 8.9 - 10.3 mg/dL 8.9  8.9  8.9   Total Protein 6.5 - 8.1 g/dL 6.6  7.1  7.1   Total Bilirubin 0.3 - 1.2 mg/dL 1.1  1.5  1.0   Alkaline Phos 38 - 126  U/L 101  125  132   AST 15 - 41 U/L 32  25  25   ALT 0 - 44 U/L '17  18  19        '$ Micro Results Recent Results (from the past 240 hour(s))  MRSA Next Gen by PCR, Nasal     Status: None   Collection Time: 02/25/22  5:50 AM   Specimen: Nasal Mucosa; Nasal Swab  Result Value Ref Range Status   MRSA by PCR Next Gen NOT DETECTED NOT DETECTED Final    Comment: (NOTE) The GeneXpert MRSA Assay (FDA approved for NASAL specimens only), is one component of a comprehensive MRSA colonization surveillance program. It is not intended to diagnose MRSA infection nor to guide or monitor treatment for MRSA infections. Test performance is not FDA approved in patients less than 51 years old. Performed at [facility], [address] [place], [place] [address]     Radiology Reports [place] ASCITES (ABDOMEN LIMITED)  Result Date: 02/26/2022 CLINICAL DATA:  Request for paracentesis EXAM: LIMITED ABDOMEN ULTRASOUND FOR ASCITES TECHNIQUE: Limited ultrasound survey for ascites was performed in all four abdominal quadrants. COMPARISON:  10/12/2021 FINDINGS: No evidence of significant ascites on limited ultrasound evaluation of all 4 abdominopelvic quadrants. IMPRESSION: No evidence of ascites. Electronically Signed   By: Abigail Miyamoto M.D.   On: 02/26/2022 10:22   DG CHEST PORT 1 VIEW  Result Date: 02/25/2022 CLINICAL DATA:  Altered level of consciousness EXAM: PORTABLE CHEST 1 VIEW COMPARISON:  02/25/2022 FINDINGS: Single frontal view of the chest demonstrates enteric catheter overlying the upper mediastinum. Tip of the catheter overlies the region of the carina, and could be within the midthoracic esophagus or the airway. Recommend removal and replacement. Cardiac silhouette is unremarkable. Stable vascular congestion without airspace disease, effusion, or pneumothorax. No acute bony abnormalities. IMPRESSION: 1. Indeterminate position of the enteric catheter, which projects over the  upper mediastinum at the level  of the carina. Catheter could be within the airway or in the midthoracic esophagus. Recommend removal and replacement. These results will be called to the ordering clinician or representative by the Radiologist Assistant, and communication documented in the PACS or [employer]. Electronically Signed   By: Randa Ngo M.D.   On: 02/25/2022 15:07   DG CHEST PORT 1 VIEW  Result Date: 02/25/2022 CLINICAL DATA:  NG tube placement EXAM: PORTABLE CHEST 1 VIEW COMPARISON:  02/24/2022, 11:37 a.m. FINDINGS: No esophagogastric tube is identified within the chest or abdomen. Coiled tubing material projects over the lower neck, presumably within the oropharynx. Cardiomegaly with diffuse bilateral interstitial pulmonary opacity. IMPRESSION: No esophagogastric tube is identified within the chest or abdomen. Coiled tubing material projects over the lower neck, presumably within the oropharynx. Recommend repositioning. These results will be called to the ordering clinician or representative by the Radiologist Assistant, and communication documented in the PACS or [employer]. Electronically Signed   By: Delanna Ahmadi M.D.   On: 02/25/2022 14:10    SIGNED: Deatra James, MD, FHM. [facility],  Pager (please use [email or web address] to page/text) Please use Epic Secure Chat for non-urgent communication (7AM-7PM)  If 7PM-7AM, please contact night-coverage [email or web address], 02/26/2022, 11:19 AM

## 2022-02-26 NOTE — Consult Note (Signed)
Gastroenterology Consult   Referring Provider: Dr. Roger Shelter Primary Care Physician:  Kathyrn Drown, MD Primary Gastroenterologist:  Dr. Gala Romney   Patient ID: Austin Morgan; [id number]; March 28, 1956   Admit date: 02/24/2022  LOS: 1 day   Date of Consultation: 02/26/2022  Reason for Consultation:  Hepatic Encephalopathy  History of Present Illness   Austin Morgan is a 66 y.o. year old male with history of cirrhosis secondary to prior alcohol use, encephalopathy, variceal bleeding in the past, IDA, CKD followed by Nephrology, history of multiple adenomas with next surveillance in 2024, presenting this admission with altered mental status in setting of recurrent hepatic encephalopathy and ammonia level of 259.   No evidence for acute infection. Worsening renal function with creatinine 2.77 on admission, now improving. CT head without contrast without acute findings. Lactulose enemas were started. Attempts at NG tube multiple times but unable to pass into stomach. Patient was obtunded over the weekend but protecting airway. [place] limited this morning ordered with no ascites.   This morning, patient is resting with eyes closed but easily awakens to verbal stimuli and maintains eye contact. He is unable to answer questions other than saying "good". Difficulty following commands. Remains alert while in room. No overt GI bleeding.  Discussed with wife, Austin Morgan, who is an NP by profession. Austin Morgan has been taking 80 mg BID lasix, spironolactone 25 mg in morning, and metolazone 5 mg in afternoon. Nephrology has been modifying diuretics in setting of CKD.   Last EGD May 2023 with Grade 1 varices, stigmata of bleeding possible, portal gastropathy  Transplant Hepatology appointment had been planned for today in [place]. Will need to reschedule this.     Past Medical History:  Diagnosis Date   Anxiety    Arthritis    Depression    Diabetes mellitus without complication ([place])    ED (erectile  dysfunction)    Gout    H/O ETOH abuse    Hyperlipidemia    Hypertension    Hypertriglyceridemia    Iron deficiency anemia due to chronic blood loss 12/20/2021   PONV (postoperative nausea and vomiting)    Sleep apnea     Past Surgical History:  Procedure Laterality Date   CARPAL TUNNEL RELEASE Bilateral    COLONOSCOPY  2010   Dr. Delfin Edis: mild diverticulosis, next colonoscopy 2020   COLONOSCOPY WITH PROPOFOL N/A 01/21/2020   [place]k: 9 polyps removed, multiple tubular adenomas.  Next colonoscopy in 3 years.   ELBOW SURGERY     ESOPHAGEAL BANDING  09/28/2021   Procedure: ESOPHAGEAL BANDING;  Surgeon: [name], MD;  [facility];  Service: Endoscopy;;   ESOPHAGOGASTRODUODENOSCOPY (EGD) WITH PROPOFOL N/A 01/21/2020   [place]: normal   ESOPHAGOGASTRODUODENOSCOPY (EGD) WITH PROPOFOL N/A 09/28/2021   Procedure: ESOPHAGOGASTRODUODENOSCOPY (EGD) WITH PROPOFOL;  Surgeon: [name], MD;  [facility];  Service: Endoscopy;  Laterality: N/A;  3:00pm   ESOPHAGOGASTRODUODENOSCOPY (EGD) WITH PROPOFOL N/A 10/31/2021   Procedure: ESOPHAGOGASTRODUODENOSCOPY (EGD) WITH PROPOFOL;  Surgeon: Harvel Quale, MD;  [facility];  Service: Gastroenterology;  Laterality: N/A;   NECK SURGERY     c4   POLYPECTOMY  01/21/2020   Procedure: POLYPECTOMY;  Surgeon: [name], MD;  [facility];  Service: Endoscopy;;  colon    Prior to Admission medications   Medication Sig Start Date End Date Taking? Authorizing Provider  albuterol (VENTOLIN HFA) 108 (90 Base) MCG/ACT inhaler Inhale 2 puffs into the lungs every  6 (six) hours as needed for wheezing. 10/24/21  Yes Kathyrn Drown, MD  cyanocobalamin (VITAMIN B12) 500 MCG tablet Take 500 mcg by mouth every Monday, Wednesday, and Friday.   Yes [provider]  ferrous sulfate 325 (65 FE) MG EC tablet Take 325 mg by mouth every morning. 11/27/21  Yes [provider]  furosemide  (LASIX) 40 MG tablet Take 80 mg by mouth 2 (two) times daily. 11/27/21 11/27/22 Yes [provider]  insulin aspart (NOVOLOG FLEXPEN) 100 UNIT/ML FlexPen Inject 5 units into the skin with meals twice a day 01/05/22  Yes Luking, Scott A, MD  insulin detemir (LEVEMIR FLEXTOUCH) 100 UNIT/ML FlexPen Inject 50 units into the skin every evening may titrate up to 60 units Patient taking differently: Inject 20-22 Units into the skin at bedtime. Inject 50 units into the skin every evening may titrate up to 60 units 01/23/22  Yes Luking, Elayne Snare, MD  lactulose (CHRONULAC) 10 GM/15ML solution Take 30 mLs (20 g total) by mouth 3 (three) times daily. 12/25/21  Yes Mahala Menghini, PA-C  metolazone (ZAROXOLYN) 5 MG tablet Take 5 mg by mouth daily. 02/14/22 05/15/22 Yes [provider]  Multiple Vitamins-Minerals (MULTIVITAMIN WITH MINERALS) tablet Take 1 tablet by mouth daily.   Yes [provider]  nadolol (CORGARD) 40 MG tablet Take 1/2 (one half) tablet po daily Patient taking differently: Take 20 mg by mouth daily. 10/24/21  Yes Luking, Scott A, MD  ondansetron (ZOFRAN) 8 MG tablet TAKE 1 TABLET BY MOUTH THREE TIMES DAILY AS NEEDED FOR NAUSEA Patient taking differently: Take 8 mg by mouth every 8 (eight) hours as needed for nausea. 12/15/21  Yes Luking, Elayne Snare, MD  pantoprazole (PROTONIX) 40 MG tablet Take 1 tablet (40 mg total) by mouth 2 (two) times daily. 09/25/21  Yes Kathyrn Drown, MD  pramipexole (MIRAPEX) 0.25 MG tablet Take 2 tablets (0.5 mg total) by mouth at bedtime. TAKE 1 TABLET BY MOUTH NIGHTLY AT  LEAST  2  HOURS  BEFORE  BEDTIME  FOR  RESTLESS  LEGS Patient taking differently: Take 0.25 mg by mouth at bedtime. 11/02/21  Yes Johnson, Clanford L, MD  spironolactone (ALDACTONE) 25 MG tablet Take 25 mg by mouth daily. 11/27/21  Yes [provider]  tadalafil (CIALIS) 20 MG tablet Take 0.5-1 tablets (10-20 mg total) by mouth every other day as needed for erectile dysfunction.  01/05/22  Yes Kathyrn Drown, MD  traZODone (DESYREL) 100 MG tablet Take 1 tablet (100 mg total) by mouth at bedtime as needed. for sleep 08/15/21  Yes Luking, Elayne Snare, MD  Continuous Blood Gluc Receiver (DEXCOM G6 RECEIVER) DEVI 1 Device by Does not apply route continuous. 01/05/22   Kathyrn Drown, MD  Continuous Blood Gluc Sensor (DEXCOM G6 SENSOR) MISC 1 Units by Does not apply route continuous. 01/05/22   Kathyrn Drown, MD    Current Facility-Administered Medications  Medication Dose Route Frequency Provider Last Rate Last Admin   acetaminophen (TYLENOL) tablet 650 mg  650 mg Oral Q6H PRN Zierle-Ghosh, Asia B, DO       Or   acetaminophen (TYLENOL) suppository 650 mg  650 mg Rectal Q6H PRN Zierle-Ghosh, Asia B, DO       Chlorhexidine Gluconate Cloth 2 % PADS 6 each  6 each Topical Q0600 Zierle-Ghosh, Asia B, DO   6 each at 02/26/22 0455   heparin injection 5,000 Units  5,000 Units Subcutaneous Q8H Zierle-Ghosh, Asia B, DO  5,000 Units at 02/26/22 0528   insulin aspart (novoLOG) injection 0-15 Units  0-15 Units Subcutaneous TID WC Zierle-Ghosh, Asia B, DO   3 Units at 02/25/22 1615   insulin detemir (LEVEMIR) injection 20 Units  20 Units Subcutaneous QHS Shahmehdi, Seyed A, MD       ipratropium-albuterol (DUONEB) 0.5-2.5 (3) MG/3ML nebulizer solution 3 mL  3 mL Nebulization Q4H PRN Shahmehdi, Seyed A, MD       lactated ringers 1,000 mL with potassium chloride 20 mEq infusion   Intravenous Continuous Skipper Cliche A, MD 50 mL/hr at 02/26/22 0836 Infusion Verify at 02/26/22 0836   lactulose (CHRONULAC) enema 200 gm  300 mL Rectal TID Skipper Cliche A, MD   300 mL at 02/25/22 2138   nadolol (CORGARD) tablet 20 mg  20 mg Oral Daily Zierle-Ghosh, Asia B, DO       ondansetron (ZOFRAN) tablet 4 mg  4 mg Oral Q6H PRN Zierle-Ghosh, Asia B, DO       Or   ondansetron (ZOFRAN) injection 4 mg  4 mg Intravenous Q6H PRN Zierle-Ghosh, Asia B, DO       Oral care mouth rinse  15 mL Mouth Rinse 4 times  per day Zierle-Ghosh, Asia B, DO   15 mL at 02/26/22 9892   oxyCODONE (Oxy IR/ROXICODONE) immediate release tablet 5 mg  5 mg Oral Q4H PRN Zierle-Ghosh, Asia B, DO       potassium chloride 10 mEq in 100 mL IVPB  10 mEq Intravenous Q1 Hr x 4 Zierle-Ghosh, Asia B, DO 100 mL/hr at 02/26/22 0836 Infusion Verify at 02/26/22 0836   pramipexole (MIRAPEX) tablet 0.25 mg  0.25 mg Oral QHS Zierle-Ghosh, Asia B, DO       spironolactone (ALDACTONE) tablet 25 mg  25 mg Oral Daily Zierle-Ghosh, Asia B, DO        Allergies as of 02/24/2022 - Review Complete 02/24/2022  Allergen Reaction Noted   Codeine Nausea And Vomiting 11/26/2008    Family History  Problem Relation Age of Onset   Heart attack Father    Heart Problems Brother        Had open heart surgery   Colon cancer Neg Hx     Social History   Socioeconomic History   Marital status: Married    Spouse name: Not on file   Number of children: Not on file   Years of education: Not on file   Highest education level: Not on file  Occupational History   Not on file  Tobacco Use   Smoking status: Former    Packs/day: 1.00    Years: 36.00    Total pack years: 36.00    Types: Cigarettes    Quit date: 03/11/2016    Years since quitting: 5.9    Passive exposure: Past   Smokeless tobacco: Never  Vaping Use   Vaping Use: Never used  Substance and Sexual Activity   Alcohol use: Not Currently    Comment: drinks a fifth of liquor per week; inpatient rehab 08/2019. denied 10/14/19. No etoh since 03/2021 (as of 09/22/21)   Drug use: No   Sexual activity: Yes  Other Topics Concern   Not on file  Social History Narrative   Wife, Austin Holness NP.   Social Determinants of Health   Financial Resource Strain: Not on file  Food Insecurity: No Food Insecurity (02/25/2022)   Hunger Vital Sign    Worried About Running Out of Food in the Last Year: Never true  Ran Out of Food in the Last Year: Never true  Transportation Needs: No Transportation Needs  (02/25/2022)   PRAPARE - Hydrologist (Medical): No    Lack of Transportation (Non-Medical): No  Physical Activity: Not on file  Stress: Not on file  Social Connections: Not on file  Intimate Partner Violence: Not At Risk (02/25/2022)   Humiliation, Afraid, Rape, and Kick questionnaire    Fear of Current or Ex-Partner: No    Emotionally Abused: No    Physically Abused: No    Sexually Abused: No     Review of Systems   Unable to obtain due to mental status   Physical Exam   Vital Signs in last 24 hours: Temp:  [97 F (36.1 C)-99.5 F (37.5 C)] 99.5 F (37.5 C) (09/18 0726) Pulse Rate:  [76-87] 86 (09/18 0800) Resp:  [12-24] 21 (09/18 0800) BP: (97-162)/(43-75) 123/60 (09/18 0800) SpO2:  [93 %-100 %] 98 % (09/18 0800) Weight:  [96.3 kg] 96.3 kg (09/18 0500) Last BM Date : 02/25/22  General:   resting with eyes closed but awakens to verbal stimuli. Responds saying "good". Unable to assess orientation. Maintains eye contact.  Head:  Normocephalic and atraumatic. Eyes:  Sclera clear, no icterus.   Ears:  Normal auditory acuity. Lungs:  Clear throughout to auscultation.   Heart:  S1 S2 present with systolic murmur Abdomen:  Soft, nontender and nondistended. No masses, hepatosplenomegaly or hernias noted. Normal bowel sounds, without guarding, and without rebound.   Rectal: deferred   Msk:  Symmetrical without gross deformities. Normal posture. Extremities:  Without edema. Neurologic:  oriented to person only Skin:  Intact without significant lesions or rashes.   Intake/Output from previous day: 09/17 0701 - 09/18 0700 In: 3053.9 [I.V.:1671.6; IV Piggyback:82.3] Out: 6025 [Urine:1250; Stool:4775] Intake/Output this shift: Total I/O In: 294.4 [I.V.:43; IV Piggyback:251.4] Out: -    Labs/Studies   Recent Labs Recent Labs    02/24/22 2300 02/25/22 0614 02/25/22 1154 02/26/22 0522  WBC 6.2 6.8  --  6.9  HGB 9.4* 9.5* 9.0* 8.4*  HCT  26.7* 26.9* 25.6* 24.6*  PLT 129* 145*  --  115*   BMET Recent Labs    02/24/22 2300 02/25/22 0614 02/26/22 0522  NA 134* 133* 140  K 3.2* 3.2* 2.8*  CL 97* 97* 108  CO2 '25 25 22  '$ GLUCOSE 188* 239* 65*  BUN 41* 43* 39*  CREATININE 2.77* 2.70* 2.41*  CALCIUM 8.9 8.9 8.9   LFT Recent Labs    02/24/22 2300 02/25/22 0614 02/26/22 0522  PROT 7.1 7.1 6.6  ALBUMIN 3.2* 3.1* 3.2*  AST 25 25 32  ALT '19 18 17  '$ ALKPHOS 132* 125 101  BILITOT 1.0 1.5* 1.1   PT/INR Recent Labs    02/24/22 2300  LABPROT 15.6*  INR 1.3*     Radiology/Studies DG CHEST PORT 1 VIEW  Result Date: 02/25/2022 CLINICAL DATA:  Altered level of consciousness EXAM: PORTABLE CHEST 1 VIEW COMPARISON:  02/25/2022 FINDINGS: Single frontal view of the chest demonstrates enteric catheter overlying the upper mediastinum. Tip of the catheter overlies the region of the carina, and could be within the midthoracic esophagus or the airway. Recommend removal and replacement. Cardiac silhouette is unremarkable. Stable vascular congestion without airspace disease, effusion, or pneumothorax. No acute bony abnormalities. IMPRESSION: 1. Indeterminate position of the enteric catheter, which projects over the upper mediastinum at the level of the carina. Catheter could be within the airway or in  the midthoracic esophagus. Recommend removal and replacement. These results will be called to the ordering clinician or representative by the Radiologist Assistant, and communication documented in the PACS or [employer]. Electronically Signed   By: Randa Ngo M.D.   On: 02/25/2022 15:07   DG CHEST PORT 1 VIEW  Result Date: 02/25/2022 CLINICAL DATA:  NG tube placement EXAM: PORTABLE CHEST 1 VIEW COMPARISON:  02/24/2022, 11:37 a.m. FINDINGS: No esophagogastric tube is identified within the chest or abdomen. Coiled tubing material projects over the lower neck, presumably within the oropharynx. Cardiomegaly with diffuse bilateral  interstitial pulmonary opacity. IMPRESSION: No esophagogastric tube is identified within the chest or abdomen. Coiled tubing material projects over the lower neck, presumably within the oropharynx. Recommend repositioning. These results will be called to the ordering clinician or representative by the Radiologist Assistant, and communication documented in the PACS or [employer]. Electronically Signed   By: Delanna Ahmadi M.D.   On: 02/25/2022 14:10   CT HEAD WO CONTRAST  Result Date: 02/25/2022 CLINICAL DATA:  Delirium, altered mental status. EXAM: CT HEAD WITHOUT CONTRAST TECHNIQUE: Contiguous axial images were obtained from the base of the skull through the vertex without intravenous contrast. RADIATION DOSE REDUCTION: This exam was performed according to the departmental dose-optimization program which includes automated exposure control, adjustment of the mA and/or kV according to patient size and/or use of iterative reconstruction technique. COMPARISON:  None Available. FINDINGS: Brain: No acute intracranial hemorrhage, midline shift or mass effect. No extra-axial fluid collection. Diffuse atrophy is noted. Periventricular white matter hypodensities are noted bilaterally. No hydrocephalus. Hyperdense bands are present in the cerebellar hemispheres bilaterally, likely calcifications. Vascular: No hyperdense vessel or unexpected calcification. Skull: Normal. Negative for fracture or focal lesion. Sinuses/Orbits: No acute finding. Other: None. IMPRESSION: 1. No acute intracranial process. 2. Atrophy with chronic microvascular ischemic changes. Electronically Signed   By: Brett Fairy M.D.   On: 02/25/2022 00:35   DG Chest Port 1 View  Result Date: 02/24/2022 CLINICAL DATA:  Altered mental status. EXAM: PORTABLE CHEST 1 VIEW COMPARISON:  11/29/2021. FINDINGS: Heart is enlarged the mediastinal contour is stable. There is atherosclerotic calcification of the aorta. Pulmonary vasculature is mildly  distended. Interstitial prominence is noted bilaterally. No effusion or pneumothorax. Cervical spinal fusion hardware is noted. No acute osseous abnormality. IMPRESSION: 1. Cardiomegaly with mildly distended pulmonary vasculature. 2. Interstitial prominence bilaterally, possible edema or infiltrate. Electronically Signed   By: Brett Fairy M.D.   On: 02/24/2022 23:49     Assessment   Eagan Shifflett is a 66 y.o. year old male with history of cirrhosis secondary to prior alcohol use, encephalopathy, variceal bleeding in the past, IDA, CKD followed by Nephrology, history of multiple adenomas with next surveillance in 2024, presenting this admission with altered mental status in setting of recurrent hepatic encephalopathy and ammonia level of 259.   Hepatic encephalopathy: suspect secondary to dehydration in setting of intravascular depletion. Diuretics have been modified by Nephrology and will need close monitoring as outpatient going forward. Unable to pass NG tube despite multiple attempts, but patient has had notable improvement in past 24 hours as previously was obtunded but now alert and maintaining eye contact. He is able to verbalize a few words but still unable to assess orientation. Will transition to oral lactulose, as he is protecting his airway and safe for oral intake. No evidence for infection precipitating event. No overt GI bleeding. No ascites on [place].   Acute on chronic kidney disease: creatinine improving.  Cirrhosis: decompensated. MELD Na 18. Transplant hepatology appt had been previously arranged for today with Roosevelt Locks, NP. We will reschedule this.   Hypokalemia: replacement per attending.   Last EGD May 2023 with Grade 1 varices, stigmata of bleeding possible, portal gastropathy: will need surveillance as outpatient.      Plan / Recommendations    Transition lactulose to oral Continue gentle IV fluids Will need close follow-up with Nephrology as outpatient Will  reschedule Transplant appt as outpatient EGD as outpatient for variceal surveillance Will continue to follow with you     02/26/2022, 8:38 AM  Annitta Needs, PhD, ANP-BC [facility] Gastroenterology

## 2022-02-26 NOTE — TOC Initial Note (Signed)
Transition of Care [facility]) - Initial/Assessment Note    Patient Details  Name: Austin Morgan MRN: [id number] Date of Birth: 1956-02-15  Transition of Care [facility]) CM/SW Contact:    Iona Beard, [place] Phone Number: 02/26/2022, 2:15 PM  Clinical Narrative:                 Pt is high risk for readmission. CSW spoke with pts wife [name] to complete assessment. Pt is independent in completing his ADLs. Pt is able to drive when needed. Pt has not had HH in the past. CSW inquired into interest in this being set up at D/C. Family/pt will consider these services if recommended at D/C however [name] does not feel this will be needed. Pt does not use any DME at this time. TOC to follow.   Expected Discharge Plan: Home/Self Care Barriers to Discharge: Continued Medical Work up   Patient Goals and CMS Choice Patient states their goals for this hospitalization and ongoing recovery are:: return home CMS Medicare.gov Compare Post Acute Care list provided to:: Patient Represenative (must comment) Choice offered to / list presented to : Patient, Spouse  Expected Discharge Plan and Services Expected Discharge Plan: Home/Self Care In-house Referral: Clinical Social Work Discharge Planning Services: CM Consult   Living arrangements for the past 2 months: Single Family Home                                      Prior Living Arrangements/Services Living arrangements for the past 2 months: Single Family Home Lives with:: Spouse Patient language and need for interpreter reviewed:: Yes Do you feel safe going back to the place where you live?: Yes      Need for Family Participation in Patient Care: Yes (Comment) Care giver support system in place?: Yes (comment)   Criminal Activity/Legal Involvement Pertinent to Current Situation/Hospitalization: No - Comment as needed  Activities of Daily Living Home Assistive Devices/Equipment: External Monitoring Devices ADL Screening (condition at time of  admission) Patient's cognitive ability adequate to safely complete daily activities?: Yes Is the patient deaf or have difficulty hearing?: No Does the patient have difficulty seeing, even when wearing glasses/contacts?: No Does the patient have difficulty concentrating, remembering, or making decisions?: No Patient able to express need for assistance with ADLs?: Yes Does the patient have difficulty dressing or bathing?: No Independently performs ADLs?: Yes (appropriate for developmental age) Does the patient have difficulty walking or climbing stairs?: No Weakness of Legs: None Weakness of Arms/Hands: None  Permission Sought/Granted                  Emotional Assessment Appearance:: Appears stated age Attitude/Demeanor/Rapport: Engaged Affect (typically observed): Accepting Orientation: : Oriented to Self, Oriented to Place, Oriented to Situation, Oriented to  Time Alcohol / Substance Use: Not Applicable Psych Involvement: No (comment)  Admission diagnosis:  Hepatic encephalopathy ([place]) [K76.82] Acute delirium [R41.0] Patient Active Problem List   Diagnosis Date Noted   Hepatic encephalopathy ([place]) 02/25/2022   Hypokalemia 02/25/2022   [place] 30/16/0109   Alcoholic cirrhosis ([place]) 32/35/5732   Splenomegaly 12/20/2021   Iron deficiency anemia due to chronic blood loss 12/20/2021   Encephalopathy, hepatic ([place]) 12/18/2021   Stage 3b chronic kidney disease (CKD) ([place]) 11/01/2021   Anasarca    Volume overload 10/30/2021   Edema 09/22/2021   Anemia 09/20/2021   Acute kidney injury superimposed on chronic kidney disease stage 3b  09/20/2021   Cirrhosis of liver with ascites ([place]) 04/19/2021   Elevated alkaline phosphatase level 05/17/2020   Aortic aneurysm ([place]) 10/22/2019   GERD (gastroesophageal reflux disease) 10/14/2019   Constipation 10/14/2019   Colon cancer screening 10/14/2019   Macrocytic anemia 09/19/2019   Recovering alcoholic in remission ([place]) 09/18/2019    Sleep disturbance 09/18/2019   Restless leg syndrome 04/17/2019   Elevated liver enzymes 03/20/2019   Alcohol abuse 01/04/2018   Obstructive sleep apnea 02/26/2017   Erectile dysfunction 07/13/2013   Hypertriglyceridemia 09/26/2012   Diabetes mellitus without complication ([place])    Hypertension    PCP:  Kathyrn Drown, MD Pharmacy:   [facility], [place] - [address] [place] [address] Phone: [phone] Fax: [place], [place] [address] [place] [address] Phone: [phone] Fax: [phone]  [place] [address] [place] - [address] [place] [address] [place] [address] [place] [address] Phone: [phone] Fax: [phone]     Social Determinants of Health (SDOH) Interventions    Readmission Risk Interventions    02/26/2022    2:13 PM  Readmission Risk Prevention Plan  Transportation Screening Complete  HRI or [place] Complete  Social Work Consult for [place] Planning/Counseling Complete  Palliative Care Screening Not Applicable  Medication Review Press photographer) Complete

## 2022-02-27 ENCOUNTER — Telehealth: Payer: Self-pay | Admitting: Gastroenterology

## 2022-02-27 DIAGNOSIS — K7682 Hepatic encephalopathy: Secondary | ICD-10-CM | POA: Diagnosis not present

## 2022-02-27 LAB — COMPREHENSIVE METABOLIC PANEL
ALT: 17 U/L (ref 0–44)
AST: 36 U/L (ref 15–41)
Albumin: 3 g/dL — ABNORMAL LOW (ref 3.5–5.0)
Alkaline Phosphatase: 92 U/L (ref 38–126)
Anion gap: 8 (ref 5–15)
BUN: 34 mg/dL — ABNORMAL HIGH (ref 8–23)
CO2: 20 mmol/L — ABNORMAL LOW (ref 22–32)
Calcium: 8.7 mg/dL — ABNORMAL LOW (ref 8.9–10.3)
Chloride: 110 mmol/L (ref 98–111)
Creatinine, Ser: 2.1 mg/dL — ABNORMAL HIGH (ref 0.61–1.24)
GFR, Estimated: 34 mL/min — ABNORMAL LOW (ref >60)
Glucose, Bld: 118 mg/dL — ABNORMAL HIGH (ref 70–99)
Potassium: 3.4 mmol/L — ABNORMAL LOW (ref 3.5–5.1)
Sodium: 138 mmol/L (ref 135–145)
Total Bilirubin: 1.3 mg/dL — ABNORMAL HIGH (ref 0.3–1.2)
Total Protein: 6.5 g/dL (ref 6.5–8.1)

## 2022-02-27 LAB — PROTIME-INR
INR: 1.3 — ABNORMAL HIGH (ref 0.8–1.2)
Prothrombin Time: 15.7 seconds — ABNORMAL HIGH (ref 11.4–15.2)

## 2022-02-27 LAB — GLUCOSE, CAPILLARY
Glucose-Capillary: 122 mg/dL — ABNORMAL HIGH (ref 70–99)
Glucose-Capillary: 250 mg/dL — ABNORMAL HIGH (ref 70–99)

## 2022-02-27 LAB — CBC
HCT: 24.3 % — ABNORMAL LOW (ref 39.0–52.0)
Hemoglobin: 8.1 g/dL — ABNORMAL LOW (ref 13.0–17.0)
MCH: 31.9 pg (ref 26.0–34.0)
MCHC: 33.3 g/dL (ref 30.0–36.0)
MCV: 95.7 fL (ref 80.0–100.0)
Platelets: 94 10*3/uL — ABNORMAL LOW (ref 150–400)
RBC: 2.54 MIL/uL — ABNORMAL LOW (ref 4.22–5.81)
RDW: 14 % (ref 11.5–15.5)
WBC: 5.6 10*3/uL (ref 4.0–10.5)
nRBC: 0 % (ref 0.0–0.2)

## 2022-02-27 LAB — AMMONIA: Ammonia: 50 umol/L — ABNORMAL HIGH (ref 9–35)

## 2022-02-27 MED ORDER — FUROSEMIDE 40 MG PO TABS: 40.0000 mg | ORAL_TABLET | Freq: Two times a day (BID) | ORAL | 1 refills | Status: AC

## 2022-02-27 MED ORDER — POTASSIUM CHLORIDE CRYS ER 20 MEQ PO TBCR
40.0000 meq | EXTENDED_RELEASE_TABLET | Freq: Once | ORAL | Status: AC
  Administered 2022-02-27: 40 meq via ORAL
  Filled 2022-02-27: qty 2

## 2022-02-27 MED ORDER — SODIUM CHLORIDE 0.9 % IV SOLN
300.0000 mg | Freq: Once | INTRAVENOUS | Status: AC
  Administered 2022-02-27: 300 mg via INTRAVENOUS
  Filled 2022-02-27: qty 15

## 2022-02-27 NOTE — Progress Notes (Signed)
Subjective: 6 BMs documented yesterday. Reports 1 mushy BM this morning. Feeling well this morning aside from legs being restless. Bowels are moving well without overt GI bleeding. No nausea, vomiting, or abdominal pain.   Reports he missed IV iron infusion yesterday that was scheduled outpatient.   Objective: Vital signs in last 24 hours: Temp:  [98.6 F (37 C)-100.8 F (38.2 C)] 99.4 F (37.4 C) (09/19 0405) Pulse Rate:  [74-88] 80 (09/19 0600) Resp:  [13-26] 15 (09/19 0600) BP: (92-123)/(31-63) 101/58 (09/19 0600) SpO2:  [93 %-100 %] 99 % (09/19 0600) Last BM Date : 02/26/22 General:   Alert and oriented, pleasant, NAD.  Head:  Normocephalic and atraumatic. Eyes:  No icterus, sclera clear. Conjuctiva pink.  Abdomen:  Bowel sounds present, soft, non-tender, non-distended. No HSM or hernias noted. No rebound or guarding. No masses appreciated  Msk:  Symmetrical without gross deformities. Normal posture. Extremities:  Without edema. Neurologic:  Alert and  oriented x4;  grossly normal neurologically. Skin:  Warm and dry, intact without significant lesions.  Psych:  Normal mood and affect.  Intake/Output from previous day: 09/18 0701 - 09/19 0700 In: 1752 [P.O.:50; I.V.:243; IV Piggyback:458.9] Out: 1000 [Urine:1000] Intake/Output this shift: No intake/output data recorded.  Lab Results: Recent Labs    02/25/22 0614 02/25/22 1154 02/26/22 0522 02/27/22 0514  WBC 6.8  --  6.9 5.6  HGB 9.5* 9.0* 8.4* 8.1*  HCT 26.9* 25.6* 24.6* 24.3*  PLT 145*  --  115* 94*   BMET Recent Labs    02/25/22 0614 02/26/22 0522 02/27/22 0514  NA 133* 140 138  K 3.2* 2.8* 3.4*  CL 97* 108 110  CO2 25 22 20*  GLUCOSE 239* 65* 118*  BUN 43* 39* 34*  CREATININE 2.70* 2.41* 2.10*  CALCIUM 8.9 8.9 8.7*   LFT Recent Labs    02/25/22 0614 02/26/22 0522 02/27/22 0514  PROT 7.1 6.6 6.5  ALBUMIN 3.1* 3.2* 3.0*  AST 25 32 36  ALT '18 17 17  '$ ALKPHOS 125 101 92  BILITOT 1.5* 1.1  1.3*   PT/INR Recent Labs    02/24/22 2300 02/26/22 1132  LABPROT 15.6* 16.1*  INR 1.3* 1.3*    Studies/Results: [place] ASCITES (ABDOMEN LIMITED)  Result Date: 02/26/2022 CLINICAL DATA:  Request for paracentesis EXAM: LIMITED ABDOMEN ULTRASOUND FOR ASCITES TECHNIQUE: Limited ultrasound survey for ascites was performed in all four abdominal quadrants. COMPARISON:  10/12/2021 FINDINGS: No evidence of significant ascites on limited ultrasound evaluation of all 4 abdominopelvic quadrants. IMPRESSION: No evidence of ascites. Electronically Signed   By: Abigail Miyamoto M.D.   On: 02/26/2022 10:22   DG CHEST PORT 1 VIEW  Result Date: 02/25/2022 CLINICAL DATA:  Altered level of consciousness EXAM: PORTABLE CHEST 1 VIEW COMPARISON:  02/25/2022 FINDINGS: Single frontal view of the chest demonstrates enteric catheter overlying the upper mediastinum. Tip of the catheter overlies the region of the carina, and could be within the midthoracic esophagus or the airway. Recommend removal and replacement. Cardiac silhouette is unremarkable. Stable vascular congestion without airspace disease, effusion, or pneumothorax. No acute bony abnormalities. IMPRESSION: 1. Indeterminate position of the enteric catheter, which projects over the upper mediastinum at the level of the carina. Catheter could be within the airway or in the midthoracic esophagus. Recommend removal and replacement. These results will be called to the ordering clinician or representative by the Radiologist Assistant, and communication documented in the PACS or [employer]. Electronically Signed   By: Diana Eves.D.  On: 02/25/2022 15:07   DG CHEST PORT 1 VIEW  Result Date: 02/25/2022 CLINICAL DATA:  NG tube placement EXAM: PORTABLE CHEST 1 VIEW COMPARISON:  02/24/2022, 11:37 a.m. FINDINGS: No esophagogastric tube is identified within the chest or abdomen. Coiled tubing material projects over the lower neck, presumably within the oropharynx.  Cardiomegaly with diffuse bilateral interstitial pulmonary opacity. IMPRESSION: No esophagogastric tube is identified within the chest or abdomen. Coiled tubing material projects over the lower neck, presumably within the oropharynx. Recommend repositioning. These results will be called to the ordering clinician or representative by the Radiologist Assistant, and communication documented in the PACS or [employer]. Electronically Signed   By: Delanna Ahmadi M.D.   On: 02/25/2022 14:10    Assessment: Austin Morgan is a 66 y.o. year old male with history of cirrhosis secondary to prior alcohol use, encephalopathy, variceal bleeding in the past, IDA, CKD followed by Nephrology, history of multiple adenomas with next surveillance in 2024, presenting this admission with altered mental status in setting of recurrent hepatic encephalopathy and ammonia level of 259.   Hepatic encephalopathy:  Suspected to be secondary to dehydration in setting of intravascular depletion. Diuretics have been modified by Nephrology and will need close monitoring as outpatient going forward. No evidence for infection precipitating event. No overt GI bleeding. No ascites on [place]. Initially treated with lactulose enemas and transitioned to oral lactulose yesterday. Clinically improved, alert and oriented x4 this morning.  Acute on chronic kidney disease: Creatinine improving.   Cirrhosis:  Decompensated. MELD Na 18 yesterday. Will add INR to labs today to recalculate. Transplant hepatology appt had been previously arranged for yesterday with Roosevelt Locks, NP. This will need to be rescheduled.   History of esophageal varices:  Last EGD May 2023 with Grade 1 varices, stigmata of bleeding possible, portal gastropathy. Recommended 6 month surveillance. Currently on Nadolol 20 mg daily.   Hypokalemia:  Improving. Replacement per hospitalist.   IDA:  Chronic. Hemoglobin slowly trending down this admission. No overt GI bleeding.  Following with hematology for IV iron prn. Missed IV iron yesterday. Will order to have this completed prior to discharge.    Plan: INR Continue Lactulose TID with goal of 3-4 BMs daily.  Will need close follow-up with Nephrology as outpatient.  Will need to reschedule Transplant appointment as outpatient. EGD as outpatient for variceal surveillance.  IV iron x 1.  Stable for discharge from GI standpoint.    LOS: 2 days    02/27/2022, 7:25 AM   Aliene Altes, [facility]

## 2022-02-27 NOTE — Discharge Summary (Signed)
Physician Discharge Summary   Patient: Austin Morgan MRN: [id number] DOB: [birth year]  Admit date:     02/24/2022  Discharge date: 02/27/22  Discharge Physician: Deatra James   PCP: Kathyrn Drown, MD   Recommendations at discharge:    Follow-up with a gastroenterologist as an outpatient (ammonia level today 50) Follow-up with nephrologist creatinine today 2.10 Daily weight, monitor your fluid intake Only one change in your home medication Lasix has been reduced from 80 to 40 mg daily Advised to monitor and take sedative medicine such as trazodone as needed  Discharge Diagnoses: Principal Problem:   Hepatic encephalopathy ([place]) Active Problems:   Acute kidney injury superimposed on chronic kidney disease stage 3b   Diabetes mellitus without complication ([place])   Hypertension   Alcoholic cirrhosis ([place])   Hypokalemia   Wheeze  Resolved Problems:   * No resolved hospital problems. *  Hospital Course:  Austin Morgan is a 66 y.o. male with medical history significant of alcohol cirrhosis, anxiety, depression, diabetes mellitus type 2, hyperlipidemia, hypertension .Marland Kitchen  Presents to the ED with a chief complaint of altered mental status.   Patient is lethargic at the time of my exam is not able to provide any history.  Earlier wife, who is a Designer, jewellery was here, and reported that the patient had become altered described as delirium.  He has been taking his lactulose as prescribed but there was concern about his ammonia level because of his altered mental status. Wife was also concerned that he was intravascularly depleted and requested fluid bolus.  In the ED he was given a 250 mill bolus.  His mentation did not improve.  Admission rectal lactulose ordered.  Patient is some peripheral edema, but no other complaints or alarming physical findings.   Of note patient is documented to have alcohol dependence in remission.  ED Temp 95.7, HR 66-71, RR,  10-17, blood pressure  105/69-122/71, satting at 100% pH 7.57, PCO2 33, PO2 80 No leukocytosis with a white blood cell count of 6.2, hemoglobin 9.4 Chemistry reveals a hypokalemia 3.2, elevated BUN of 41, elevated creatinine 2.77, hyperglycemia and elevated ammonia 259 CT head shows no acute intracranial process Chest x-ray shows cardiomegaly with mild distended pulmonary vasculature, interstitial prominence bilateral, possible edema or infiltrate EKG shows a heart rate of 72, sinus rhythm, QTc 521 Alcohol levels undetectable UDS positive for marijuana Admission requested for acute metabolic encephalopathy secondary to elevated ammonia  Assessment and Plan: * Hepatic encephalopathy (HCC)  - Ammonia at 259 >>> 268 >> 56 >>50 today  -Initiating lactulose rectally--continuing lactulose enema 3 mg 3 times daily -Failed NG tube placement x3 on 02/25/2022 -Mentation improved, back to baseline AAO x3 -Continue spironolactone   - INR normal 1.3   Acute kidney injury superimposed on chronic kidney disease stage 3b - Improving  - history of CKD stage IIIb --acute on chronic kidney disease -  Increased from 2.35>> 2.77 >> 2.70 >> 2.41 >> 2.10 - Avoid nephrotoxic agents when possible - Status post gentle IV fluid hydration - Home medication of Lasix has been adjusted reduced from 80 to 40 mg daily, continue Zaroxolyn -Follow-up with primary nephrologist to monitor kidney function closely, monitor fluid balance  Diabetes mellitus without complication (HCC) - Last hemoglobin A1c 6.0 - Patient takes 50 units of basal insulin at baseline -As p.o. intake improves, resuming home regimen insulin  Wheeze  Continue albuterol as needed  Hypokalemia - Was monitored and repleted IV and orally  Alcoholic cirrhosis (  [place]) -History of cirrhosis with normal INR at 1.3, elevated ammonia at 259 >>> 268 >>> 56 >>50 -More awake, awake alert oriented x3 no acute distress -Status post aggressive treatment with lactulose enema  30 g 3 times a day in ICU setting-much improved ammonia level -Resume oral lactulose as prescribed goal weight 3 BMs a day -Follow-up with gastroenterology as an outpatient,  -Continue spironolactone -Continue to monitor  Hypertension - Continue nadolol and spironolactone       Consultants: Gastroenterologist Procedures performed: Lactulose enemas Disposition: Home Diet recommendation:  Discharge Diet Orders (From admission, onward)     Start     Ordered   02/27/22 0000  Diet - low sodium heart healthy        02/27/22 1026           Cardiac diet DISCHARGE MEDICATION: Allergies as of 02/27/2022       Reactions   Codeine Nausea And Vomiting        Medication List     TAKE these medications    albuterol 108 (90 Base) MCG/ACT inhaler Commonly known as: VENTOLIN HFA Inhale 2 puffs into the lungs every 6 (six) hours as needed for wheezing.   cyanocobalamin 500 MCG tablet Commonly known as: VITAMIN B12 Take 500 mcg by mouth every Monday, Wednesday, and Friday.   Dexcom G6 Receiver Devi 1 Device by Does not apply route continuous.   Dexcom G6 Sensor Misc 1 Units by Does not apply route continuous.   ferrous sulfate 325 (65 FE) MG EC tablet Take 325 mg by mouth every morning.   furosemide 40 MG tablet Commonly known as: LASIX Take 1 tablet (40 mg total) by mouth 2 (two) times daily. What changed: how much to take   lactulose 10 GM/15ML solution Commonly known as: CHRONULAC Take 30 mLs (20 g total) by mouth 3 (three) times daily.   Levemir FlexTouch 100 UNIT/ML FlexPen Generic drug: insulin detemir Inject 50 units into the skin every evening may titrate up to 60 units What changed:  how much to take how to take this when to take this   metolazone 5 MG tablet Commonly known as: ZAROXOLYN Take 5 mg by mouth daily.   multivitamin with minerals tablet Take 1 tablet by mouth daily.   nadolol 40 MG tablet Commonly known as: CORGARD Take 1/2 (one  half) tablet po daily What changed:  how much to take how to take this when to take this additional instructions   NovoLOG FlexPen 100 UNIT/ML FlexPen Generic drug: insulin aspart Inject 5 units into the skin with meals twice a day   ondansetron 8 MG tablet Commonly known as: ZOFRAN TAKE 1 TABLET BY MOUTH THREE TIMES DAILY AS NEEDED FOR NAUSEA What changed:  how much to take how to take this when to take this reasons to take this additional instructions   pantoprazole 40 MG tablet Commonly known as: PROTONIX Take 1 tablet (40 mg total) by mouth 2 (two) times daily.   pramipexole 0.25 MG tablet Commonly known as: MIRAPEX Take 2 tablets (0.5 mg total) by mouth at bedtime. TAKE 1 TABLET BY MOUTH NIGHTLY AT  LEAST  2  HOURS  BEFORE  BEDTIME  FOR  RESTLESS  LEGS What changed:  how much to take additional instructions   spironolactone 25 MG tablet Commonly known as: ALDACTONE Take 25 mg by mouth daily.   tadalafil 20 MG tablet Commonly known as: CIALIS Take 0.5-1 tablets (10-20 mg total) by mouth every other day  as needed for erectile dysfunction.   traZODone 100 MG tablet Commonly known as: DESYREL Take 1 tablet (100 mg total) by mouth at bedtime as needed. for sleep        Discharge Exam: Filed Weights   02/24/22 2316 02/26/22 0500  Weight: 89.5 kg 96.3 kg      Physical Exam:   General:  AAO x 3,  cooperative, no distress;   HEENT:  Normocephalic, PERRL, otherwise with in Normal limits   Neuro:  CNII-XII intact. , normal motor and sensation, reflexes intact   Lungs:   Clear to auscultation BL, Respirations unlabored,  No wheezes / crackles  Cardio:    S1/S2, RRR, No murmure, No Rubs or Gallops   Abdomen:  Soft, non-tender, bowel sounds active all four quadrants, no guarding or peritoneal signs.  Muscular  skeletal:  Limited exam -global generalized weaknesses - in bed, able to move all 4 extremities,   2+ pulses,  symmetric, No pitting edema  Skin:   Dry, warm to touch, negative for any Rashes,  Wounds: Please see nursing documentation          Condition at discharge: good  The results of significant diagnostics from this hospitalization (including imaging, microbiology, ancillary and laboratory) are listed below for reference.   Imaging Studies: [place] ASCITES (ABDOMEN LIMITED)  Result Date: 02/26/2022 CLINICAL DATA:  Request for paracentesis EXAM: LIMITED ABDOMEN ULTRASOUND FOR ASCITES TECHNIQUE: Limited ultrasound survey for ascites was performed in all four abdominal quadrants. COMPARISON:  10/12/2021 FINDINGS: No evidence of significant ascites on limited ultrasound evaluation of all 4 abdominopelvic quadrants. IMPRESSION: No evidence of ascites. Electronically Signed   By: Abigail Miyamoto M.D.   On: 02/26/2022 10:22   DG CHEST PORT 1 VIEW  Result Date: 02/25/2022 CLINICAL DATA:  Altered level of consciousness EXAM: PORTABLE CHEST 1 VIEW COMPARISON:  02/25/2022 FINDINGS: Single frontal view of the chest demonstrates enteric catheter overlying the upper mediastinum. Tip of the catheter overlies the region of the carina, and could be within the midthoracic esophagus or the airway. Recommend removal and replacement. Cardiac silhouette is unremarkable. Stable vascular congestion without airspace disease, effusion, or pneumothorax. No acute bony abnormalities. IMPRESSION: 1. Indeterminate position of the enteric catheter, which projects over the upper mediastinum at the level of the carina. Catheter could be within the airway or in the midthoracic esophagus. Recommend removal and replacement. These results will be called to the ordering clinician or representative by the Radiologist Assistant, and communication documented in the PACS or [employer]. Electronically Signed   By: Randa Ngo M.D.   On: 02/25/2022 15:07   DG CHEST PORT 1 VIEW  Result Date: 02/25/2022 CLINICAL DATA:  NG tube placement EXAM: PORTABLE CHEST 1 VIEW COMPARISON:   02/24/2022, 11:37 a.m. FINDINGS: No esophagogastric tube is identified within the chest or abdomen. Coiled tubing material projects over the lower neck, presumably within the oropharynx. Cardiomegaly with diffuse bilateral interstitial pulmonary opacity. IMPRESSION: No esophagogastric tube is identified within the chest or abdomen. Coiled tubing material projects over the lower neck, presumably within the oropharynx. Recommend repositioning. These results will be called to the ordering clinician or representative by the Radiologist Assistant, and communication documented in the PACS or [employer]. Electronically Signed   By: Delanna Ahmadi M.D.   On: 02/25/2022 14:10   CT HEAD WO CONTRAST  Result Date: 02/25/2022 CLINICAL DATA:  Delirium, altered mental status. EXAM: CT HEAD WITHOUT CONTRAST TECHNIQUE: Contiguous axial images were obtained from the base of  the skull through the vertex without intravenous contrast. RADIATION DOSE REDUCTION: This exam was performed according to the departmental dose-optimization program which includes automated exposure control, adjustment of the mA and/or kV according to patient size and/or use of iterative reconstruction technique. COMPARISON:  None Available. FINDINGS: Brain: No acute intracranial hemorrhage, midline shift or mass effect. No extra-axial fluid collection. Diffuse atrophy is noted. Periventricular white matter hypodensities are noted bilaterally. No hydrocephalus. Hyperdense bands are present in the cerebellar hemispheres bilaterally, likely calcifications. Vascular: No hyperdense vessel or unexpected calcification. Skull: Normal. Negative for fracture or focal lesion. Sinuses/Orbits: No acute finding. Other: None. IMPRESSION: 1. No acute intracranial process. 2. Atrophy with chronic microvascular ischemic changes. Electronically Signed   By: Brett Fairy M.D.   On: 02/25/2022 00:35   DG Chest Port 1 View  Result Date: 02/24/2022 CLINICAL DATA:  Altered  mental status. EXAM: PORTABLE CHEST 1 VIEW COMPARISON:  11/29/2021. FINDINGS: Heart is enlarged the mediastinal contour is stable. There is atherosclerotic calcification of the aorta. Pulmonary vasculature is mildly distended. Interstitial prominence is noted bilaterally. No effusion or pneumothorax. Cervical spinal fusion hardware is noted. No acute osseous abnormality. IMPRESSION: 1. Cardiomegaly with mildly distended pulmonary vasculature. 2. Interstitial prominence bilaterally, possible edema or infiltrate. Electronically Signed   By: Brett Fairy M.D.   On: 02/24/2022 23:49    Microbiology: Results for orders placed or performed during the hospital encounter of 02/24/22  MRSA Next Gen by PCR, Nasal     Status: None   Collection Time: 02/25/22  5:50 AM   Specimen: Nasal Mucosa; Nasal Swab  Result Value Ref Range Status   MRSA by PCR Next Gen NOT DETECTED NOT DETECTED Final    Comment: (NOTE) The GeneXpert MRSA Assay (FDA approved for NASAL specimens only), is one component of a comprehensive MRSA colonization surveillance program. It is not intended to diagnose MRSA infection nor to guide or monitor treatment for MRSA infections. Test performance is not FDA approved in patients less than 28 years old. Performed at [facility], [address], [place], [place] [address]     Labs: CBC: Recent Labs  Lab 02/24/22 2300 02/25/22 0614 02/25/22 1154 02/26/22 0522 02/27/22 0514  WBC 6.2 6.8  --  6.9 5.6  NEUTROABS 4.3 4.9  --   --   --   HGB 9.4* 9.5* 9.0* 8.4* 8.1*  HCT 26.7* 26.9* 25.6* 24.6* 24.3*  MCV 91.4 90.6  --  93.2 95.7  PLT 129* 145*  --  115* 94*   Basic Metabolic Panel: Recent Labs  Lab 02/24/22 2300 02/25/22 0614 02/26/22 0522 02/27/22 0514  NA 134* 133* 140 138  K 3.2* 3.2* 2.8* 3.4*  CL 97* 97* 108 110  CO2 '25 25 22 '$ 20*  GLUCOSE 188* 239* 65* 118*  BUN 41* 43* 39* 34*  CREATININE 2.77* 2.70* 2.41* 2.10*  CALCIUM 8.9 8.9 8.9 8.7*  MG  --  1.7  --   --     Liver Function Tests: Recent Labs  Lab 02/24/22 2300 02/25/22 0614 02/26/22 0522 02/27/22 0514  AST 25 25 32 36  ALT '19 18 17 17  '$ ALKPHOS 132* 125 101 92  BILITOT 1.0 1.5* 1.1 1.3*  PROT 7.1 7.1 6.6 6.5  ALBUMIN 3.2* 3.1* 3.2* 3.0*   CBG: Recent Labs  Lab 02/26/22 0724 02/26/22 1159 02/26/22 1549 02/26/22 2028 02/27/22 0737  GLUCAP 97 56* 84 195* 122*    Discharge time spent: greater than 30 minutes.  Signed: Valeria Batman  Roger Shelter, MD [facility] 02/27/2022

## 2022-02-27 NOTE — Care Management Important Message (Signed)
Important Message  Patient Details  Name: Austin Morgan MRN: [id number] Date of Birth: [birth year]   Medicare Important Message Given:  N/A - LOS <3 / Initial given by admissions     Tommy Medal 02/27/2022, 11:21 AM

## 2022-02-27 NOTE — Telephone Encounter (Signed)
Austin Morgan/Austin Morgan: Patient needs in person 2 week hospital follow-up with any available APP.   [name]:  Patient missed his appointment with Roosevelt Locks, NP for transplant evaluation due to being admitted. We need to get his appointment rescheduled ASAP.

## 2022-02-27 NOTE — Telephone Encounter (Signed)
Noted faxed to office to request them to call pt to reschedule

## 2022-02-27 NOTE — Progress Notes (Signed)
Went over discharge instructions with patient and patient wife. All questions answered and both expressed full understanding of instructions. Patient discharged home with all belongings with wife via car.

## 2022-02-28 ENCOUNTER — Telehealth: Payer: Self-pay | Admitting: *Deleted

## 2022-02-28 ENCOUNTER — Encounter: Payer: Self-pay | Admitting: *Deleted

## 2022-02-28 NOTE — Patient Outreach (Signed)
  Care Coordination [facility] Note Transition Care Management Unsuccessful Follow-up Telephone Call  Date of discharge and from where:  02/27/22 from [facility]  Attempts:  1st Attempt  Reason for unsuccessful TCM follow-up call:  Left voice message. 1st attempt reached patient on cell phone but connection was poor and call air dropped. Left VM on home number requesting call back.   Chong Sicilian, BSN, RN-BC RN Care Coordinator [place] Direct Dial: [phone] Main #: [phone]

## 2022-02-28 NOTE — Telephone Encounter (Signed)
I see several openings on all APPs schedules. Would be ok to convert a virtual to follow-up in person in this case.

## 2022-02-28 NOTE — Telephone Encounter (Signed)
Great, thank you!

## 2022-02-28 NOTE — Patient Outreach (Signed)
  Care Coordination [facility] Note Transition Care Management Unsuccessful Follow-up Telephone Call  Date of discharge and from where:  02/27/22 at [facility]  Attempts:  2nd Attempt  Reason for unsuccessful TCM follow-up call:  No answer/busy. Received VM from wife, [name], returning my call. Attempt to return her call was unsuccessful. Unable to reach patient by phone as well.   Chong Sicilian, BSN, RN-BC RN Care Coordinator [place] Direct Dial: [phone] Main #: [phone]

## 2022-02-28 NOTE — Telephone Encounter (Signed)
Nurses Please connect with [name] We certainly would like to see Austin Morgan next week.  Please set him up in one of my open slots for next week. Currently I am on leave the next few days-if for some reason he needs to be seen before next week please work with available providers here at the office thank you  Next week before office visit if possible I would recommend CBC, CMP with estimated GFR, ammonia level Diagnosis cirrhosis, anemia of GI blood loss, hepatic encephalopathy If the lab work can be done the day before the visit that would be great but if that is not possible we can do the lab work on the same day when he comes  Thanks-Dr. Nicki Reaper

## 2022-02-28 NOTE — Telephone Encounter (Signed)
Left message to return call 

## 2022-03-01 ENCOUNTER — Encounter: Payer: Self-pay | Admitting: *Deleted

## 2022-03-01 ENCOUNTER — Telehealth: Payer: Self-pay | Admitting: *Deleted

## 2022-03-01 ENCOUNTER — Other Ambulatory Visit: Payer: Self-pay | Admitting: Family Medicine

## 2022-03-01 DIAGNOSIS — D500 Iron deficiency anemia secondary to blood loss (chronic): Secondary | ICD-10-CM

## 2022-03-01 DIAGNOSIS — R188 Other ascites: Secondary | ICD-10-CM

## 2022-03-01 DIAGNOSIS — K7682 Hepatic encephalopathy: Secondary | ICD-10-CM

## 2022-03-01 NOTE — Patient Outreach (Signed)
  Care Coordination [facility] Note Transition Care Management Follow-up Telephone Call Date of discharge and from where: 02/27/22 from [facility] How have you been since you were released from the hospital? "Doing better" Any questions or concerns? No  Items Reviewed: Did the pt receive and understand the discharge instructions provided? Yes  Medications obtained and verified? Yes  Other? Yes lower extremity edema management and daily weights. Down 3 lbs from yesterday. Continues to have BLE. No worse since discharge. Propping legs up when seated/lying. Encouraged calf pumps and explained. Advised to call PCP with weight gain of >3 lbs in one day or >5lbs in one week. Discussed decrease in lasix from '80mg'$  bid to '40mg'$  bid. Any new allergies since your discharge? No  Dietary orders reviewed? Yes Do you have support at home? Yes   Home Care and Equipment/Supplies: Were home health services ordered? no If so, what is the name of the agency? N/a  Has the agency set up a time to come to the patient's home? not applicable Were any new equipment or medical supplies ordered?  No What is the name of the medical supply agency? N/a Were you able to get the supplies/equipment? not applicable Do you have any questions related to the use of the equipment or supplies? No  Functional Questionnaire: (I = Independent and D = Dependent) ADLs: I  Bathing/Dressing- I  Meal Prep- I  Eating- I  Maintaining continence- I  Transferring/Ambulation- I  Managing Meds- I  Follow up appointments reviewed:  PCP Hospital f/u appt confirmed? Yes  Scheduled to see Pearson Forster, NP on 03/02/22 @ 11:00. [facility] f/u appt confirmed? Yes  Scheduled to see GI- Neil Crouch, PA-C on 03/05/22 @ 1:00 and Dr Theador Hawthorne on 03/15/22 at 3:00. Are transportation arrangements needed? No  If their condition worsens, is the pt aware to call PCP or go to the Emergency Dept.? Yes Was the patient provided with contact  information for the PCP's office or ED? Yes Was to pt encouraged to call back with questions or concerns? Yes  SDOH assessments and interventions completed:   Yes  Care Coordination Interventions Activated:  Yes   Care Coordination Interventions:  Referred for Care Coordination Services:  RN Care Coordinator   Encounter Outcome:  Pt. Visit Completed    Chong Sicilian, BSN, RN-BC RN Care Coordinator [place]: [phone] Main #: [phone]

## 2022-03-02 ENCOUNTER — Ambulatory Visit (INDEPENDENT_AMBULATORY_CARE_PROVIDER_SITE_OTHER): Payer: Medicare HMO | Admitting: Nurse Practitioner

## 2022-03-02 ENCOUNTER — Encounter: Payer: Self-pay | Admitting: Nurse Practitioner

## 2022-03-02 VITALS — BP 106/58 | HR 70 | Temp 97.9°F | Ht 66.0 in | Wt 187.0 lb

## 2022-03-02 DIAGNOSIS — N1832 Chronic kidney disease, stage 3b: Secondary | ICD-10-CM | POA: Diagnosis not present

## 2022-03-02 DIAGNOSIS — Z23 Encounter for immunization: Secondary | ICD-10-CM | POA: Diagnosis not present

## 2022-03-02 DIAGNOSIS — I10 Essential (primary) hypertension: Secondary | ICD-10-CM | POA: Diagnosis not present

## 2022-03-02 DIAGNOSIS — E119 Type 2 diabetes mellitus without complications: Secondary | ICD-10-CM

## 2022-03-02 DIAGNOSIS — R188 Other ascites: Secondary | ICD-10-CM | POA: Diagnosis not present

## 2022-03-02 DIAGNOSIS — K7031 Alcoholic cirrhosis of liver with ascites: Secondary | ICD-10-CM

## 2022-03-02 DIAGNOSIS — R601 Generalized edema: Secondary | ICD-10-CM | POA: Diagnosis not present

## 2022-03-02 DIAGNOSIS — K7682 Hepatic encephalopathy: Secondary | ICD-10-CM | POA: Diagnosis not present

## 2022-03-02 DIAGNOSIS — S40811A Abrasion of right upper arm, initial encounter: Secondary | ICD-10-CM | POA: Diagnosis not present

## 2022-03-02 DIAGNOSIS — D500 Iron deficiency anemia secondary to blood loss (chronic): Secondary | ICD-10-CM | POA: Diagnosis not present

## 2022-03-02 DIAGNOSIS — Z125 Encounter for screening for malignant neoplasm of prostate: Secondary | ICD-10-CM | POA: Diagnosis not present

## 2022-03-02 DIAGNOSIS — K7460 Unspecified cirrhosis of liver: Secondary | ICD-10-CM | POA: Diagnosis not present

## 2022-03-02 DIAGNOSIS — R69 Illness, unspecified: Secondary | ICD-10-CM | POA: Diagnosis not present

## 2022-03-02 MED ORDER — SCOPOLAMINE 1 MG/3DAYS TD PT72: 1.0000 | MEDICATED_PATCH | TRANSDERMAL | 0 refills | Status: DC

## 2022-03-02 NOTE — Progress Notes (Unsigned)
Subjective:    Patient ID: Austin Morgan, male    DOB: 1956-04-22, 66 y.o.   MRN: [id number]  HPI Hospitalization follow up.  Patient was recently hospitalized for acute delirium and hepatic encephalopathy.  Has also had an acute kidney injury in addition to his chronic kidney disease.  Has cirrhosis of the liver with ascites.  Has multiple serious health issues being addressed by his specialist.  Has been monitoring his weight at home due to fluid retention.  States his weight this morning was 179.4 pounds.  Staying within a 2 to 3 pound range overall.  States it is much better from the hospital.  Adherent to his Lasix dosing.  Describes his breathing as "good".  Following a low-sodium diet and guidelines from his nephrologist.  Wearing his Dexcom for continuous blood sugar monitoring.  In the office his sugar is 183.  Unable to pull up his history on his app, states his sugars are doing much better.  Has a few slight abrasions on his right arm related to his hospitalization, requesting getting caught up on his tetanus vaccine.  States he is only listed for a liver transplant.  Had an exam earlier this year.  Request prescription for scopolamine patches for an upcoming cruise in November.  Has been struggling some emotionally dealing with all of his health issues but states he is doing better.   Review of Systems  Constitutional:  Positive for fatigue. Negative for fever.  Respiratory:  Positive for shortness of breath. Negative for chest tightness.        Shortness of breath much improved, ambulating much better.  States he had no difficulty walking from the car into the office.  Cardiovascular:  Positive for leg swelling. Negative for chest pain and palpitations.  Gastrointestinal:  Negative for constipation and diarrhea.       Objective:   Physical Exam NAD.  Alert, oriented.  Calm cheerful affect.  Lungs clear.  Heart regular rate rhythm.  Abdomen distended and nontender, difficulty  assessing masses organomegaly due to distention.  Lower extremities 2-3+ pitting edema. Diabetic Foot Exam - Simple   Simple Foot Form  03/02/2022 11:30 AM  Visual Inspection See comments: Yes Sensation Testing Intact to touch and monofilament testing bilaterally: Yes Pulse Check See comments: Yes Comments DP Pulses strong bilaterally; unable to assess PT due to edema; Edema 2-3+ LE; skin intact    Today's Vitals   03/02/22 1103  BP: (!) 106/58  Pulse: 70  Temp: 97.9 F (36.6 C)  SpO2: 95%  Weight: 187 lb (84.8 kg)  Height: '5\' 6"'$  (1.676 m)   Body mass index is 30.18 kg/m.        Assessment & Plan:   Problem List Items Addressed This Visit       Digestive   Cirrhosis of liver with ascites ([place]) - Primary     Endocrine   Diabetes mellitus without complication (HCC) (Chronic)     Nervous and Auditory   Hepatic encephalopathy (HCC)     Genitourinary   Stage 3b chronic kidney disease (CKD) (HCC) (Chronic)     Other   Edema   Other Visit Diagnoses     Immunization due       Relevant Orders   Flu Vaccine QUAD High Dose(Fluad) (Completed)   Td : Tetanus/diphtheria >7yo Preservative  free (Completed)   Abrasion of right upper extremity, initial encounter       Relevant Orders   Td : Tetanus/diphtheria >7yo Preservative  free (Completed)      Flu vaccine given today.  Updated tetanus vaccine also given.  Patient wants to hold on pneumonia vaccine since he is getting to today.  Recommend Medicare wellness exam and updated pneumonia vaccine at next visit.  Follow-up with gastroenterology on 03/05/2022 and nephrology on 03/15/2022.  Encourage patient to get lab work done as soon as possible that was ordered by Dr. Nicki Reaper. Continue dietary measures and activity as tolerated. Meds ordered this encounter  Medications   scopolamine (TRANSDERM-SCOP) 1 MG/3DAYS    Sig: Place 1 patch (1.5 mg total) onto the skin every 3 (three) days.    Dispense:  4 patch    Refill:  0     Order Specific Question:   Supervising Provider    Answer:   Kathyrn Drown [id number]   Prescription sent in for scopolamine patches as requested. Reminded patient to contact our office if he gains more than 3 pounds in 1 day or more than 5 pounds per week. Return in about 3 months (around 06/01/2022). Contact office sooner if any problems.

## 2022-03-03 ENCOUNTER — Encounter: Payer: Self-pay | Admitting: Nurse Practitioner

## 2022-03-03 LAB — PSA: Prostate Specific Ag, Serum: 1.1 ng/mL (ref 0.0–4.0)

## 2022-03-03 LAB — CBC WITH DIFFERENTIAL/PLATELET
Basophils Absolute: 0 10*3/uL (ref 0.0–0.2)
Basos: 1 %
EOS (ABSOLUTE): 0.4 10*3/uL (ref 0.0–0.4)
Eos: 8 %
Hematocrit: 26.8 % — ABNORMAL LOW (ref 37.5–51.0)
Hemoglobin: 9.1 g/dL — ABNORMAL LOW (ref 13.0–17.7)
Immature Grans (Abs): 0 10*3/uL (ref 0.0–0.1)
Immature Granulocytes: 1 %
Lymphocytes Absolute: 1.1 10*3/uL (ref 0.7–3.1)
Lymphs: 21 %
MCH: 31 pg (ref 26.6–33.0)
MCHC: 34 g/dL (ref 31.5–35.7)
MCV: 91 fL (ref 79–97)
Monocytes Absolute: 0.6 10*3/uL (ref 0.1–0.9)
Monocytes: 13 %
Neutrophils Absolute: 2.8 10*3/uL (ref 1.4–7.0)
Neutrophils: 56 %
Platelets: 128 10*3/uL — ABNORMAL LOW (ref 150–450)
RBC: 2.94 x10E6/uL — ABNORMAL LOW (ref 4.14–5.80)
RDW: 12.2 % (ref 11.6–15.4)
WBC: 5 10*3/uL (ref 3.4–10.8)

## 2022-03-03 LAB — CMP14+EGFR
ALT: 21 IU/L (ref 0–44)
AST: 33 IU/L (ref 0–40)
Albumin/Globulin Ratio: 1.2 (ref 1.2–2.2)
Albumin: 3.9 g/dL (ref 3.9–4.9)
Alkaline Phosphatase: 160 IU/L — ABNORMAL HIGH (ref 44–121)
BUN/Creatinine Ratio: 16 (ref 10–24)
BUN: 31 mg/dL — ABNORMAL HIGH (ref 8–27)
Bilirubin Total: 0.6 mg/dL (ref 0.0–1.2)
CO2: 18 mmol/L — ABNORMAL LOW (ref 20–29)
Calcium: 8.9 mg/dL (ref 8.6–10.2)
Chloride: 100 mmol/L (ref 96–106)
Creatinine, Ser: 1.97 mg/dL — ABNORMAL HIGH (ref 0.76–1.27)
Globulin, Total: 3.2 g/dL (ref 1.5–4.5)
Glucose: 94 mg/dL (ref 70–99)
Potassium: 3.9 mmol/L (ref 3.5–5.2)
Sodium: 137 mmol/L (ref 134–144)
Total Protein: 7.1 g/dL (ref 6.0–8.5)
eGFR: 37 mL/min/{1.73_m2} — ABNORMAL LOW (ref >59)

## 2022-03-03 LAB — HEMOGLOBIN A1C
Est. average glucose Bld gHb Est-mCnc: 137 mg/dL
Hgb A1c MFr Bld: 6.4 % — ABNORMAL HIGH (ref 4.8–5.6)

## 2022-03-03 LAB — AMMONIA: Ammonia: 162 ug/dL (ref 40–200)

## 2022-03-04 NOTE — Progress Notes (Unsigned)
GI Office Note    Referring Provider: Kathyrn Drown, Morgan Primary Care Physician:  Austin Drown, Morgan  Primary Gastroenterologist: Austin Cornea, Morgan   Chief Complaint   No chief complaint on file.   History of Present Illness   Austin Morgan is a 66 y.o. male presenting today for hospital follow-up.      Patient discharged from hospital last week. Admitted with HE felt to be secondary to dehydration in setting of intravascular depletion. Nephrology has been altering diuretics in setting of CKD. He has history of cirrhosis secondary to prior etoh use. His course has been complicated by HE, variceal bleeding. Last EGD 10/2021 with Grade 1 esophageal varcies, stigmata of bleeding possible, portal gastropathy. Recommended six month surveillance. Currently on nadolol '29mg'$  daily. He also has h/o IDA, followed by hematology for IV iron prn.   Attempt for paracentesis 02/26/22 (no evidence of ascites on u/s).  He missed new pt appointment for liver transplant evaluation due to being in the hospital last week.   Due EGD November 2023.    Still driving. I have encouraged him not to drive.  Iron infusion this morning. Gave blood for Austin Morgan.  BM emptied out at 4pm. One big one. Lactulose 30cc 3-4 times per day.    Austin Morgan rescheduled appt*** [place]   2+ pitting     ***Austin Morgan, when appt   Medications   Current Outpatient Medications  Medication Sig Dispense Refill   albuterol (VENTOLIN HFA) 108 (90 Base) MCG/ACT inhaler Inhale 2 puffs into the lungs every 6 (six) hours as needed for wheezing. 1 each 2   Continuous Blood Gluc Receiver (DEXCOM G6 RECEIVER) DEVI 1 Device by Does not apply route continuous. 1 each 1   Continuous Blood Gluc Sensor (DEXCOM G6 SENSOR) MISC 1 Units by Does not apply route continuous. 3 each 1   cyanocobalamin (VITAMIN B12) 500 MCG tablet Take 500 mcg by mouth every Monday, Wednesday, and Friday.     ferrous sulfate 325 (65  FE) MG EC tablet Take 325 mg by mouth every morning.     furosemide (LASIX) 40 MG tablet Take 1 tablet (40 mg total) by mouth 2 (two) times daily. 30 tablet 1   insulin aspart (NOVOLOG FLEXPEN) 100 UNIT/ML FlexPen Inject 5 units into the skin with meals twice a day 15 mL 2   insulin detemir (LEVEMIR FLEXTOUCH) 100 UNIT/ML FlexPen Inject 50 units into the skin every evening may titrate up to 60 units (Patient taking differently: Inject 20-22 Units into the skin at bedtime. Inject 50 units into the skin every evening may titrate up to 60 units) 15 mL 1   lactulose (CHRONULAC) 10 GM/15ML solution Take 30 mLs (20 g total) by mouth 3 (three) times daily. 2700 mL 5   metolazone (ZAROXOLYN) 5 MG tablet Take 5 mg by mouth daily.     Multiple Vitamins-Minerals (MULTIVITAMIN WITH MINERALS) tablet Take 1 tablet by mouth daily.     nadolol (CORGARD) 40 MG tablet Take 1/2 (one half) tablet po daily (Patient taking differently: Take 20 mg by mouth daily.) 45 tablet 1   ondansetron (ZOFRAN) 8 MG tablet TAKE 1 TABLET BY MOUTH THREE TIMES DAILY AS NEEDED FOR NAUSEA (Patient taking differently: Take 8 mg by mouth every 8 (eight) hours as needed for nausea.) 20 tablet 3   pantoprazole (PROTONIX) 40 MG tablet Take 1 tablet (40 mg total) by mouth 2 (two) times daily. 60 tablet 3   pramipexole (MIRAPEX)  0.25 MG tablet Take 2 tablets (0.5 mg total) by mouth at bedtime. TAKE 1 TABLET BY MOUTH NIGHTLY AT  LEAST  2  HOURS  BEFORE  BEDTIME  FOR  RESTLESS  LEGS (Patient taking differently: Take 0.25 mg by mouth at bedtime.)     scopolamine (TRANSDERM-SCOP) 1 MG/3DAYS Place 1 patch (1.5 mg total) onto the skin every 3 (three) days. 4 patch 0   spironolactone (ALDACTONE) 25 MG tablet Take 25 mg by mouth daily.     tadalafil (CIALIS) 20 MG tablet Take 0.5-1 tablets (10-20 mg total) by mouth every other day as needed for erectile dysfunction. 6 tablet 11   traZODone (DESYREL) 100 MG tablet Take 1 tablet (100 mg total) by mouth at  bedtime as needed. for sleep 90 tablet 3   No current facility-administered medications for this visit.    Allergies   Allergies as of 03/05/2022 - Review Complete 03/05/2022  Allergen Reaction Noted   Codeine Nausea And Vomiting 11/26/2008     Past Medical History   Past Medical History:  Diagnosis Date   Anxiety    Arthritis    Depression    Diabetes mellitus without complication ([place])    ED (erectile dysfunction)    Gout    H/O ETOH abuse    Hyperlipidemia    Hypertension    Hypertriglyceridemia    Iron deficiency anemia due to chronic blood loss 12/20/2021   PONV (postoperative nausea and vomiting)    Sleep apnea     Past Surgical History   Past Surgical History:  Procedure Laterality Date   CARPAL TUNNEL RELEASE Bilateral    COLONOSCOPY  2010   Dr. Delfin Morgan: mild diverticulosis, next colonoscopy 2020   COLONOSCOPY WITH PROPOFOL N/A 01/21/2020   Austin Morgan: 9 polyps removed, multiple tubular adenomas.  Next colonoscopy in 3 years.   ELBOW SURGERY     ESOPHAGEAL BANDING  09/28/2021   Procedure: ESOPHAGEAL BANDING;  Surgeon: Austin Morgan;  [facility];  Service: Endoscopy;;   ESOPHAGOGASTRODUODENOSCOPY (EGD) WITH PROPOFOL N/A 01/21/2020   Austin Morgan: normal   ESOPHAGOGASTRODUODENOSCOPY (EGD) WITH PROPOFOL N/A 09/28/2021   Procedure: ESOPHAGOGASTRODUODENOSCOPY (EGD) WITH PROPOFOL;  Surgeon: Austin Morgan;  [facility];  Service: Endoscopy;  Laterality: N/A;  3:00pm   ESOPHAGOGASTRODUODENOSCOPY (EGD) WITH PROPOFOL N/A 10/31/2021   Procedure: ESOPHAGOGASTRODUODENOSCOPY (EGD) WITH PROPOFOL;  Surgeon: Austin Morgan;  [facility];  Service: Gastroenterology;  Laterality: N/A;   NECK SURGERY     c4   POLYPECTOMY  01/21/2020   Procedure: POLYPECTOMY;  Surgeon: Austin Morgan;  [facility];  Service: Endoscopy;;  colon    Past Family History   Family History  Problem Relation Age of Onset    Heart attack Father    Heart Problems Brother        Had open heart surgery   Colon cancer Neg Hx     Past Social History   Social History   Socioeconomic History   Marital status: Married    Spouse name: Not on file   Number of children: Not on file   Years of education: Not on file   Highest education level: Not on file  Occupational History   Not on file  Tobacco Use   Smoking status: Former    Packs/day: 1.00    Years: 36.00    Total pack years: 36.00    Types: Cigarettes    Quit date: 03/11/2016  Years since quitting: 5.9    Passive exposure: Past   Smokeless tobacco: Never  Vaping Use   Vaping Use: Never used  Substance and Sexual Activity   Alcohol use: Not Currently    Comment: drinks a fifth of liquor per week; inpatient rehab 08/2019. denied 10/14/19. No etoh since 03/2021 (as of 09/22/21)   Drug use: No   Sexual activity: Yes  Other Topics Concern   Not on file  Social History Narrative   Wife, Quentez Lober NP.   Social Determinants of Health   Financial Resource Strain: Low Risk  (03/01/2022)   Overall Financial Resource Strain (CARDIA)    Difficulty of Paying Living Expenses: Not very hard  Food Insecurity: No Food Insecurity (02/25/2022)   Hunger Vital Sign    Worried About Running Out of Food in the Last Year: Never true    Ran Out of Food in the Last Year: Never true  Transportation Needs: No Transportation Needs (03/01/2022)   PRAPARE - Hydrologist (Medical): No    Lack of Transportation (Non-Medical): No  Physical Activity: Not on file  Stress: Not on file  Social Connections: Not on file  Intimate Partner Violence: Not At Risk (02/25/2022)   Humiliation, Afraid, Rape, and Kick questionnaire    Fear of Current or Ex-Partner: No    Emotionally Abused: No    Physically Abused: No    Sexually Abused: No    Review of Systems   General: Negative for anorexia, weight loss, fever, chills, fatigue, weakness. ENT:  Negative for hoarseness, difficulty swallowing , nasal congestion. CV: Negative for chest pain, angina, palpitations, dyspnea on exertion, peripheral edema.  Respiratory: Negative for dyspnea at rest, dyspnea on exertion, cough, sputum, wheezing.  GI: See history of present illness. GU:  Negative for dysuria, hematuria, urinary incontinence, urinary frequency, nocturnal urination.  Endo: Negative for unusual weight change.     Physical Exam   There were no vitals taken for this visit.   General: Well-nourished, well-developed in no acute distress.  Eyes: No icterus. Mouth: Oropharyngeal mucosa moist and pink , no lesions erythema or exudate. Lungs: Clear to auscultation bilaterally.  Heart: Regular rate and rhythm, no murmurs rubs or gallops.  Abdomen: Bowel sounds are normal, nontender, nondistended, no hepatosplenomegaly or masses,  no abdominal bruits or hernia , no rebound or guarding.  Rectal: not performed  Extremities: No lower extremity edema. No clubbing or deformities. Neuro: Alert and oriented x 4   Skin: Warm and dry, no jaundice.   Psych: Alert and cooperative, normal mood and affect.  Labs   Lab Results  Component Value Date   CREATININE 1.97 (H) 03/02/2022   BUN 31 (H) 03/02/2022   NA 137 03/02/2022   K 3.9 03/02/2022   CL 100 03/02/2022   CO2 18 (L) 03/02/2022   Lab Results  Component Value Date   ALT 21 03/02/2022   AST 33 03/02/2022        ALKPHOS 160 (H) 03/02/2022   BILITOT 0.6 03/02/2022   Lab Results  Component Value Date   WBC 5.0 03/02/2022   HGB 9.1 (L) 03/02/2022   HCT 26.8 (L) 03/02/2022   MCV 91 03/02/2022   PLT 128 (L) 03/02/2022   Lab Results  Component Value Date   INR 1.3 (H) 02/27/2022   INR 1.3 (H) 02/26/2022   INR 1.3 (H) 02/24/2022   Lab Results  Component Value Date   HGBA1C 6.4 (H) 03/02/2022  Imaging Studies   [place] ASCITES (ABDOMEN LIMITED)  Result Date: 02/26/2022 CLINICAL DATA:  Request for paracentesis EXAM:  LIMITED ABDOMEN ULTRASOUND FOR ASCITES TECHNIQUE: Limited ultrasound survey for ascites was performed in all four abdominal quadrants. COMPARISON:  10/12/2021 FINDINGS: No evidence of significant ascites on limited ultrasound evaluation of all 4 abdominopelvic quadrants. IMPRESSION: No evidence of ascites. Electronically Signed   By: Abigail Miyamoto M.D.   On: 02/26/2022 10:22   DG CHEST PORT 1 VIEW  Result Date: 02/25/2022 CLINICAL DATA:  Altered level of consciousness EXAM: PORTABLE CHEST 1 VIEW COMPARISON:  02/25/2022 FINDINGS: Single frontal view of the chest demonstrates enteric catheter overlying the upper mediastinum. Tip of the catheter overlies the region of the carina, and could be within the midthoracic esophagus or the airway. Recommend removal and replacement. Cardiac silhouette is unremarkable. Stable vascular congestion without airspace disease, effusion, or pneumothorax. No acute bony abnormalities. IMPRESSION: 1. Indeterminate position of the enteric catheter, which projects over the upper mediastinum at the level of the carina. Catheter could be within the airway or in the midthoracic esophagus. Recommend removal and replacement. These results will be called to the ordering clinician or representative by the Radiologist Assistant, and communication documented in the PACS or [employer]. Electronically Signed   By: Randa Ngo M.D.   On: 02/25/2022 15:07   DG CHEST PORT 1 VIEW  Result Date: 02/25/2022 CLINICAL DATA:  NG tube placement EXAM: PORTABLE CHEST 1 VIEW COMPARISON:  02/24/2022, 11:37 a.m. FINDINGS: No esophagogastric tube is identified within the chest or abdomen. Coiled tubing material projects over the lower neck, presumably within the oropharynx. Cardiomegaly with diffuse bilateral interstitial pulmonary opacity. IMPRESSION: No esophagogastric tube is identified within the chest or abdomen. Coiled tubing material projects over the lower neck, presumably within the  oropharynx. Recommend repositioning. These results will be called to the ordering clinician or representative by the Radiologist Assistant, and communication documented in the PACS or [employer]. Electronically Signed   By: Delanna Ahmadi M.D.   On: 02/25/2022 14:10   CT HEAD WO CONTRAST  Result Date: 02/25/2022 CLINICAL DATA:  Delirium, altered mental status. EXAM: CT HEAD WITHOUT CONTRAST TECHNIQUE: Contiguous axial images were obtained from the base of the skull through the vertex without intravenous contrast. RADIATION DOSE REDUCTION: This exam was performed according to the departmental dose-optimization program which includes automated exposure control, adjustment of the mA and/or kV according to patient size and/or use of iterative reconstruction technique. COMPARISON:  None Available. FINDINGS: Brain: No acute intracranial hemorrhage, midline shift or mass effect. No extra-axial fluid collection. Diffuse atrophy is noted. Periventricular white matter hypodensities are noted bilaterally. No hydrocephalus. Hyperdense bands are present in the cerebellar hemispheres bilaterally, likely calcifications. Vascular: No hyperdense vessel or unexpected calcification. Skull: Normal. Negative for fracture or focal lesion. Sinuses/Orbits: No acute finding. Other: None. IMPRESSION: 1. No acute intracranial process. 2. Atrophy with chronic microvascular ischemic changes. Electronically Signed   By: Brett Fairy M.D.   On: 02/25/2022 00:35   DG Chest Port 1 View  Result Date: 02/24/2022 CLINICAL DATA:  Altered mental status. EXAM: PORTABLE CHEST 1 VIEW COMPARISON:  11/29/2021. FINDINGS: Heart is enlarged the mediastinal contour is stable. There is atherosclerotic calcification of the aorta. Pulmonary vasculature is mildly distended. Interstitial prominence is noted bilaterally. No effusion or pneumothorax. Cervical spinal fusion hardware is noted. No acute osseous abnormality. IMPRESSION: 1. Cardiomegaly with  mildly distended pulmonary vasculature. 2. Interstitial prominence bilaterally, possible edema or infiltrate. Electronically Signed  By: Brett Fairy M.D.   On: 02/24/2022 23:49    Assessment       PLAN   ***due u/s in 04/2022 Repeat labs first week of 03/2022 Appt with dawn   Laureen Ochs. Bobby Rumpf, [place], [facility]

## 2022-03-05 ENCOUNTER — Ambulatory Visit (INDEPENDENT_AMBULATORY_CARE_PROVIDER_SITE_OTHER): Payer: Medicare HMO | Admitting: Gastroenterology

## 2022-03-05 ENCOUNTER — Encounter: Payer: Self-pay | Admitting: Gastroenterology

## 2022-03-05 ENCOUNTER — Inpatient Hospital Stay: Payer: Medicare HMO | Attending: Hematology

## 2022-03-05 ENCOUNTER — Telehealth: Payer: Self-pay

## 2022-03-05 VITALS — BP 98/58 | HR 66 | Temp 97.6°F | Resp 18

## 2022-03-05 VITALS — BP 112/64 | HR 83 | Temp 97.7°F | Ht 67.0 in | Wt 185.2 lb

## 2022-03-05 DIAGNOSIS — N189 Chronic kidney disease, unspecified: Secondary | ICD-10-CM | POA: Diagnosis not present

## 2022-03-05 DIAGNOSIS — K7460 Unspecified cirrhosis of liver: Secondary | ICD-10-CM | POA: Diagnosis not present

## 2022-03-05 DIAGNOSIS — E1129 Type 2 diabetes mellitus with other diabetic kidney complication: Secondary | ICD-10-CM | POA: Diagnosis not present

## 2022-03-05 DIAGNOSIS — D631 Anemia in chronic kidney disease: Secondary | ICD-10-CM | POA: Diagnosis not present

## 2022-03-05 DIAGNOSIS — K7030 Alcoholic cirrhosis of liver without ascites: Secondary | ICD-10-CM | POA: Diagnosis not present

## 2022-03-05 DIAGNOSIS — D508 Other iron deficiency anemias: Secondary | ICD-10-CM | POA: Diagnosis not present

## 2022-03-05 DIAGNOSIS — R809 Proteinuria, unspecified: Secondary | ICD-10-CM | POA: Diagnosis not present

## 2022-03-05 DIAGNOSIS — N1832 Chronic kidney disease, stage 3b: Secondary | ICD-10-CM | POA: Insufficient documentation

## 2022-03-05 DIAGNOSIS — D696 Thrombocytopenia, unspecified: Secondary | ICD-10-CM | POA: Insufficient documentation

## 2022-03-05 DIAGNOSIS — I5033 Acute on chronic diastolic (congestive) heart failure: Secondary | ICD-10-CM | POA: Diagnosis not present

## 2022-03-05 DIAGNOSIS — R69 Illness, unspecified: Secondary | ICD-10-CM | POA: Diagnosis not present

## 2022-03-05 DIAGNOSIS — I129 Hypertensive chronic kidney disease with stage 1 through stage 4 chronic kidney disease, or unspecified chronic kidney disease: Secondary | ICD-10-CM | POA: Diagnosis not present

## 2022-03-05 DIAGNOSIS — D509 Iron deficiency anemia, unspecified: Secondary | ICD-10-CM | POA: Insufficient documentation

## 2022-03-05 DIAGNOSIS — D500 Iron deficiency anemia secondary to blood loss (chronic): Secondary | ICD-10-CM

## 2022-03-05 DIAGNOSIS — D638 Anemia in other chronic diseases classified elsewhere: Secondary | ICD-10-CM | POA: Diagnosis not present

## 2022-03-05 DIAGNOSIS — E1122 Type 2 diabetes mellitus with diabetic chronic kidney disease: Secondary | ICD-10-CM | POA: Diagnosis not present

## 2022-03-05 MED ORDER — SODIUM CHLORIDE 0.9 % IV SOLN: Freq: Once | INTRAVENOUS | Status: AC

## 2022-03-05 MED ORDER — SODIUM CHLORIDE 0.9 % IV SOLN
300.0000 mg | Freq: Once | INTRAVENOUS | Status: AC
  Administered 2022-03-05: 300 mg via INTRAVENOUS
  Filled 2022-03-05: qty 300

## 2022-03-05 NOTE — Patient Instructions (Addendum)
Continue lactulose 30cc 3-4 times daily to have 2-3 soft stools. Continue pantoprazole twice daily before a meal. Continue nadolol 1/2 tablet daily. Continue spironolactone 25 mg daily. Continue furosemide 40 mg twice daily. We will submit Xifaxan patient assistance papers. If we are able to get medication for affordable price, it would be ideal to stay on this medication. If you are able to have liver transplant, you would not likely need Xifaxan afterwards.  In 04/2022, you will be due for repeat upper endoscopy to see if your esophageal varices need banding.  Most important item right now is to get you seen by liver transplant team! Return to our office in 6 weeks.

## 2022-03-05 NOTE — Progress Notes (Signed)
Pt presents today for Venofer IV iron infusion per provider's order. Vital signs stable and pt voiced no new complaints at this time.  Pt took pre-meds Tylenol and Zyrtec at home prior to arrival. Peripheral IV started with good blood return pre and post infusion.  Venofer 300 mg given today per MD orders. Tolerated infusion without adverse affects. Vital signs stable. No complaints at this time. Discharged from clinic ambulatory in stable condition. Alert and oriented x 3. F/U with [facility] as scheduled.

## 2022-03-05 NOTE — Telephone Encounter (Signed)
Patient assistance forms for Xifaxan are in your box to be signed.

## 2022-03-05 NOTE — Patient Instructions (Signed)
[facility]  Discharge Instructions: Thank you for choosing [facility] to provide your oncology and hematology care.  If you have a lab appointment with the Cancer Center, please come in thru the Main Entrance and check in at the main information desk.  Wear comfortable clothing and clothing appropriate for easy access to any Portacath or PICC line.   We strive to give you quality time with your provider. You may need to reschedule your appointment if you arrive late (15 or more minutes).  Arriving late affects you and other patients whose appointments are after yours.  Also, if you miss three or more appointments without notifying the office, you may be dismissed from the clinic at the provider's discretion.      For prescription refill requests, have your pharmacy contact our office and allow 72 hours for refills to be completed.    Today you received Venofer IV iron infusion.     BELOW ARE SYMPTOMS THAT SHOULD BE REPORTED IMMEDIATELY: *FEVER GREATER THAN 100.4 F (38 C) OR HIGHER *CHILLS OR SWEATING *NAUSEA AND VOMITING THAT IS NOT CONTROLLED WITH YOUR NAUSEA MEDICATION *UNUSUAL SHORTNESS OF BREATH *UNUSUAL BRUISING OR BLEEDING *URINARY PROBLEMS (pain or burning when urinating, or frequent urination) *BOWEL PROBLEMS (unusual diarrhea, constipation, pain near the anus) TENDERNESS IN MOUTH AND THROAT WITH OR WITHOUT PRESENCE OF ULCERS (sore throat, sores in mouth, or a toothache) UNUSUAL RASH, SWELLING OR PAIN  UNUSUAL VAGINAL DISCHARGE OR ITCHING   Items with * indicate a potential emergency and should be followed up as soon as possible or go to the Emergency Department if any problems should occur.  Please show the CHEMOTHERAPY ALERT CARD or IMMUNOTHERAPY ALERT CARD at check-in to the Emergency Department and triage nurse.  Should you have questions after your visit or need to cancel or reschedule your appointment, please contact [facility] AT  [phone]  and follow the prompts.  Office hours are 8:00 a.m. to 4:30 p.m. Monday - Friday. Please note that voicemails left after 4:00 p.m. may not be returned until the following business day.  We are closed weekends and major holidays. You have access to a nurse at all times for urgent questions. Please call the main number to the clinic [phone] and follow the prompts.  For any non-urgent questions, you may also contact your provider using MyChart. We now offer e-Visits for anyone 18 and older to request care online for non-urgent symptoms. For details visit [email or web address].   Also download the MyChart app! Go to the app store, search "MyChart", open the app, select [place], and log in with your MyChart username and password.  Masks are optional in the cancer centers. If you would like for your care team to wear a mask while they are taking care of you, please let them know. You may have one support person who is at least 65 years old accompany you for your appointments.  

## 2022-03-06 NOTE — Telephone Encounter (Signed)
Signed and placed on your desk.

## 2022-03-06 NOTE — Telephone Encounter (Signed)
Faxed form to [facility].

## 2022-03-08 ENCOUNTER — Encounter (HOSPITAL_COMMUNITY)
Admission: RE | Admit: 2022-03-08 | Discharge: 2022-03-08 | Disposition: A | Discharge status: Home / Self Care | Payer: Medicare HMO | Source: Ambulatory Visit | Attending: Nephrology | Admitting: Nephrology

## 2022-03-08 VITALS — BP 131/70 | HR 65 | Temp 97.7°F

## 2022-03-08 DIAGNOSIS — N1832 Chronic kidney disease, stage 3b: Secondary | ICD-10-CM | POA: Insufficient documentation

## 2022-03-08 DIAGNOSIS — D631 Anemia in chronic kidney disease: Secondary | ICD-10-CM | POA: Insufficient documentation

## 2022-03-08 LAB — POCT HEMOGLOBIN-HEMACUE: Hemoglobin: 10 g/dL — ABNORMAL LOW (ref 13.0–17.0)

## 2022-03-09 NOTE — Telephone Encounter (Signed)
Patient assistance for Xifaxan 550 mg Tablets was approved through June 10, 2022. Approval letter and forms will be scanned into patient's chart. Pt was made aware.

## 2022-03-10 DIAGNOSIS — I9589 Other hypotension: Secondary | ICD-10-CM | POA: Diagnosis not present

## 2022-03-10 DIAGNOSIS — R809 Proteinuria, unspecified: Secondary | ICD-10-CM | POA: Diagnosis not present

## 2022-03-10 DIAGNOSIS — I5033 Acute on chronic diastolic (congestive) heart failure: Secondary | ICD-10-CM | POA: Diagnosis not present

## 2022-03-10 DIAGNOSIS — D6959 Other secondary thrombocytopenia: Secondary | ICD-10-CM | POA: Diagnosis not present

## 2022-03-10 DIAGNOSIS — K7460 Unspecified cirrhosis of liver: Secondary | ICD-10-CM | POA: Diagnosis not present

## 2022-03-10 DIAGNOSIS — E1129 Type 2 diabetes mellitus with other diabetic kidney complication: Secondary | ICD-10-CM | POA: Diagnosis not present

## 2022-03-10 DIAGNOSIS — D638 Anemia in other chronic diseases classified elsewhere: Secondary | ICD-10-CM | POA: Diagnosis not present

## 2022-03-10 DIAGNOSIS — N170 Acute kidney failure with tubular necrosis: Secondary | ICD-10-CM | POA: Diagnosis not present

## 2022-03-10 DIAGNOSIS — E1122 Type 2 diabetes mellitus with diabetic chronic kidney disease: Secondary | ICD-10-CM | POA: Diagnosis not present

## 2022-03-10 DIAGNOSIS — N189 Chronic kidney disease, unspecified: Secondary | ICD-10-CM | POA: Diagnosis not present

## 2022-03-10 DIAGNOSIS — Z8679 Personal history of other diseases of the circulatory system: Secondary | ICD-10-CM | POA: Diagnosis not present

## 2022-03-12 ENCOUNTER — Telehealth: Payer: Self-pay | Admitting: *Deleted

## 2022-03-12 NOTE — Telephone Encounter (Signed)
LMOVM to call back to schedule EGD with Dr. Gala Romney, ASA 3

## 2022-03-13 ENCOUNTER — Inpatient Hospital Stay: Payer: Medicare HMO | Attending: Hematology

## 2022-03-13 VITALS — BP 106/62 | HR 64 | Temp 97.3°F | Resp 18

## 2022-03-13 DIAGNOSIS — D631 Anemia in chronic kidney disease: Secondary | ICD-10-CM | POA: Diagnosis not present

## 2022-03-13 DIAGNOSIS — D509 Iron deficiency anemia, unspecified: Secondary | ICD-10-CM | POA: Insufficient documentation

## 2022-03-13 DIAGNOSIS — D500 Iron deficiency anemia secondary to blood loss (chronic): Secondary | ICD-10-CM

## 2022-03-13 DIAGNOSIS — N184 Chronic kidney disease, stage 4 (severe): Secondary | ICD-10-CM | POA: Diagnosis not present

## 2022-03-13 DIAGNOSIS — D696 Thrombocytopenia, unspecified: Secondary | ICD-10-CM | POA: Diagnosis not present

## 2022-03-13 MED ORDER — SODIUM CHLORIDE 0.9 % IV SOLN: Freq: Once | INTRAVENOUS | Status: AC

## 2022-03-13 MED ORDER — SODIUM CHLORIDE 0.9 % IV SOLN
300.0000 mg | Freq: Once | INTRAVENOUS | Status: AC
  Administered 2022-03-13: 300 mg via INTRAVENOUS
  Filled 2022-03-13: qty 300

## 2022-03-13 NOTE — Progress Notes (Signed)
Patient took tylenol and zyrtec at home.

## 2022-03-13 NOTE — Patient Instructions (Signed)
[facility]  Discharge Instructions: Thank you for choosing [facility] to provide your oncology and hematology care.  If you have a lab appointment with the [facility], please come in thru the Main Entrance and check in at the main information desk.  Wear comfortable clothing and clothing appropriate for easy access to any Portacath or PICC line.   We strive to give you quality time with your provider. You may need to reschedule your appointment if you arrive late (15 or more minutes).  Arriving late affects you and other patients whose appointments are after yours.  Also, if you miss three or more appointments without notifying the office, you may be dismissed from the clinic at the provider's discretion.      For prescription refill requests, have your pharmacy contact our office and allow 72 hours for refills to be completed.    Today you received the following chemotherapy and/or immunotherapy agents Venofer IV iron infusion.   BELOW ARE SYMPTOMS THAT SHOULD BE REPORTED IMMEDIATELY: *FEVER GREATER THAN 100.4 F (38 C) OR HIGHER *CHILLS OR SWEATING *NAUSEA AND VOMITING THAT IS NOT CONTROLLED WITH YOUR NAUSEA MEDICATION *UNUSUAL SHORTNESS OF BREATH *UNUSUAL BRUISING OR BLEEDING *URINARY PROBLEMS (pain or burning when urinating, or frequent urination) *BOWEL PROBLEMS (unusual diarrhea, constipation, pain near the anus) TENDERNESS IN MOUTH AND THROAT WITH OR WITHOUT PRESENCE OF ULCERS (sore throat, sores in mouth, or a toothache) UNUSUAL RASH, SWELLING OR PAIN  UNUSUAL VAGINAL DISCHARGE OR ITCHING   Items with * indicate a potential emergency and should be followed up as soon as possible or go to the Emergency Department if any problems should occur.  Please show the CHEMOTHERAPY ALERT CARD or IMMUNOTHERAPY ALERT CARD at check-in to the Emergency Department and triage nurse.  Should you have questions after your visit or need to cancel or  reschedule your appointment, please contact [facility] [phone]  and follow the prompts.  Office hours are 8:00 a.m. to 4:30 p.m. Monday - Friday. Please note that voicemails left after 4:00 p.m. may not be returned until the following business day.  We are closed weekends and major holidays. You have access to a nurse at all times for urgent questions. Please call the main number to the clinic [phone] and follow the prompts.  For any non-urgent questions, you may also contact your provider using MyChart. We now offer e-Visits for anyone 67 and older to request care online for non-urgent symptoms. For details visit [email or web address].   Also download the MyChart app! Go to the app store, search "MyChart", open the app, select [place], and log in with your MyChart username and password.  Masks are optional in the cancer centers. If you would like for your care team to wear a mask while they are taking care of you, please let them know. You may have one support person who is at least 65 years old accompany you for your appointments.

## 2022-03-13 NOTE — Progress Notes (Signed)
Pt presents today for Venofer IV iron infusion per provider's order. Vital signs stable and pt voiced no new complaints at this time.  Peripheral IV started with good blood return pre and post infusion.  Venofer 300 mg  given today per MD orders. Tolerated infusion without adverse affects. Vital signs stable. No complaints at this time. Discharged from clinic ambulatory in stable condition. Alert and oriented x 3. F/U with [facility] as scheduled.   

## 2022-03-16 ENCOUNTER — Inpatient Hospital Stay: Payer: Medicare HMO

## 2022-03-16 DIAGNOSIS — E1165 Type 2 diabetes mellitus with hyperglycemia: Secondary | ICD-10-CM | POA: Diagnosis not present

## 2022-03-20 ENCOUNTER — Ambulatory Visit: Payer: Self-pay | Admitting: Family Medicine

## 2022-03-22 ENCOUNTER — Ambulatory Visit: Payer: Self-pay | Admitting: *Deleted

## 2022-03-22 NOTE — Patient Outreach (Signed)
  Care Coordination   Initial Visit Note   03/23/2022 Name: [name] MRN: [id number] DOB: 1955-09-20  Austin Morgan is a 66 y.o. year old male who sees Luking, Elayne Snare, MD for primary care. I spoke with  [name] by phone today.  What matters to the patients health and wellness today?  [name] states he doing much better since he lost 30 lbs of fluids recently  He reports he is now 170 lbs  He reports his voice is a little weak  He is reporting he is not needing to use his CPAP as much since the weight loss. He confirms he feels he needs to refitted for his CPAP  During the assessment of his Diabetes the outreach was disconnected  RN CM called back but there was no answer and a voice message was left to include RN CM's office number for a return call.  Received a message from Patient's wife Austin Morgan and returned a call to her  Reviewed with her the details of the outreach to [name] She denies [name] has any coordination needs at this time  [name] reports no social or medical needs at this time Agreed with Austin Morgan that [name] may not benefit from Care coordination as [name] is a [facility] Palliative care Nurse Practitioner who informs RN CM she is familiar with [facility] and [facility] Nurse Practitioner, [name].       Goals Addressed               This Visit's Progress     Patient Stated     COMPLETED: health maintenance (THN) (pt-stated)   On track     Care Coordination Interventions: Patient interviewed about adult health maintenance status including  Depression screen    Falls risk assessment    Fluid removal, CPAP use Spoke with patient's wife after She left RN CM a message and a disconnection during the outreach with [name] Agreed with Austin Morgan that Mr Dibello may not benefit from Care coordination as [name] is a [facility] Palliative care Nurse Practitioner  Case closure per patient/wife preference        SDOH assessments and interventions  completed:  Yes  SDOH Interventions Today    Flowsheet Row Most Recent Value  SDOH Interventions   Food Insecurity Interventions Intervention Not Indicated  Housing Interventions Intervention Not Indicated  Transportation Interventions Intervention Not Indicated  Utilities Interventions Intervention Not Indicated  Financial Strain Interventions Intervention Not Indicated  Stress Interventions Intervention Not Indicated        Care Coordination Interventions Activated:  Yes  Care Coordination Interventions:  No, not indicated   Follow up plan: No further intervention required.  Case closure per the preference of patient and his wife  Encounter Outcome:  Pt. Visit Completed   [name] [name], RN, BSN, [place] Coordinator Office number [phone]

## 2022-03-23 ENCOUNTER — Encounter: Payer: Self-pay | Admitting: *Deleted

## 2022-03-23 NOTE — Patient Instructions (Signed)
Visit Information  Thank you for taking time to visit with me today. Please don't hesitate to contact me if I can be of assistance to you.   Following are the goals we discussed today:   Goals Addressed               This Visit's Progress     Patient Stated     COMPLETED: health maintenance (THN) (pt-stated)   On track     Care Coordination Interventions: Patient interviewed about adult health maintenance status including  Depression screen    Falls risk assessment    Fluid removal, CPAP use Spoke with patient's wife after She left RN CM a message and a disconnection during the outreach with Mr Moch Agreed with [name] that Mr Mamaril may not benefit from Care coordination as Leviticus Harton is a [facility] Palliative care Nurse Practitioner  Case closure per patient/wife preference        Our next appointment is  n/a  on n/a at n/a  Please call the care guide team at [phone] if you need to cancel or reschedule your appointment.   If you are experiencing a Mental Health or [place] or need someone to talk to, please call the Suicide and Crisis Lifeline: 988 call the [place] National Suicide Prevention Lifeline: (631)554-0190 or TTY: (980)822-7577 TTY 260-766-7505) to talk to a trained counselor call 1-800-273-TALK (toll free, 24 hour hotline) call the [facility]: (228)039-4118 call 911   The patient verbalized understanding of instructions, educational materials, and care plan provided today and DECLINED offer to receive copy of patient instructions, educational materials, and care plan.   The patient has been provided with contact information for the care management team and has been advised to call with any health related questions or concerns.   Cheryll Keisler [name], RN, BSN, [place] Coordinator Office number [phone]

## 2022-03-27 ENCOUNTER — Encounter: Payer: Self-pay | Admitting: *Deleted

## 2022-03-27 ENCOUNTER — Encounter (HOSPITAL_COMMUNITY): Payer: Self-pay

## 2022-03-27 ENCOUNTER — Encounter (HOSPITAL_COMMUNITY)
Admission: RE | Admit: 2022-03-27 | Discharge: 2022-03-27 | Disposition: A | Discharge status: Home / Self Care | Payer: Medicare HMO | Source: Ambulatory Visit | Attending: Nephrology | Admitting: Nephrology

## 2022-03-27 VITALS — BP 122/64 | HR 59 | Temp 97.5°F | Resp 18 | Ht 66.0 in | Wt 170.0 lb

## 2022-03-27 DIAGNOSIS — N1832 Chronic kidney disease, stage 3b: Secondary | ICD-10-CM | POA: Insufficient documentation

## 2022-03-27 MED ORDER — EPOETIN ALFA 3000 UNIT/ML IJ SOLN: 3000.0000 [IU] | Freq: Once | INTRAMUSCULAR | Status: DC

## 2022-03-27 NOTE — Telephone Encounter (Signed)
Spoke with pt. Scheduled for 11/29 at 8:30am. Aware will mail instructions/pre-op appt.

## 2022-03-30 ENCOUNTER — Other Ambulatory Visit: Payer: Self-pay | Admitting: Family Medicine

## 2022-03-30 DIAGNOSIS — G2581 Restless legs syndrome: Secondary | ICD-10-CM

## 2022-04-03 DIAGNOSIS — R69 Illness, unspecified: Secondary | ICD-10-CM | POA: Diagnosis not present

## 2022-04-03 DIAGNOSIS — K7682 Hepatic encephalopathy: Secondary | ICD-10-CM | POA: Diagnosis not present

## 2022-04-03 DIAGNOSIS — K7031 Alcoholic cirrhosis of liver with ascites: Secondary | ICD-10-CM | POA: Diagnosis not present

## 2022-04-03 DIAGNOSIS — I8500 Esophageal varices without bleeding: Secondary | ICD-10-CM | POA: Diagnosis not present

## 2022-04-03 DIAGNOSIS — N1832 Chronic kidney disease, stage 3b: Secondary | ICD-10-CM | POA: Diagnosis not present

## 2022-04-03 DIAGNOSIS — M6284 Sarcopenia: Secondary | ICD-10-CM | POA: Diagnosis not present

## 2022-04-04 ENCOUNTER — Telehealth: Payer: Self-pay | Admitting: Family Medicine

## 2022-04-04 DIAGNOSIS — K7682 Hepatic encephalopathy: Secondary | ICD-10-CM | POA: Diagnosis not present

## 2022-04-04 DIAGNOSIS — N1832 Chronic kidney disease, stage 3b: Secondary | ICD-10-CM | POA: Diagnosis not present

## 2022-04-04 DIAGNOSIS — I8500 Esophageal varices without bleeding: Secondary | ICD-10-CM | POA: Diagnosis not present

## 2022-04-04 DIAGNOSIS — M6284 Sarcopenia: Secondary | ICD-10-CM | POA: Diagnosis not present

## 2022-04-04 DIAGNOSIS — R69 Illness, unspecified: Secondary | ICD-10-CM | POA: Diagnosis not present

## 2022-04-04 DIAGNOSIS — K7031 Alcoholic cirrhosis of liver with ascites: Secondary | ICD-10-CM | POA: Diagnosis not present

## 2022-04-04 LAB — POCT HEMOGLOBIN-HEMACUE: Hemoglobin: 10.9 g/dL — ABNORMAL LOW (ref 13.0–17.0)

## 2022-04-04 NOTE — Telephone Encounter (Signed)
Nurses Recently patient was seen by Dr. Zollie Scale [facility]  Please let the patient know that Dr.Zamor sent [place] a copy of the evaluation.  Within that evaluation it was recommended for him to consider having cardiac evaluation although they do not recommend cardiac catheterization at this time but possibly before transplant surgery would need to do so.  Based on all of this with we would recommend a follow-up office visit with ourselves somewhere in the next few weeks then we can discuss this further and help set him up with cardiology for initial consultation to help get established with cardiology  If patient is on board with all of this please have him go ahead and set up appointment accordingly thank you

## 2022-04-05 NOTE — Telephone Encounter (Signed)
Mychart message sent to patient.

## 2022-04-06 ENCOUNTER — Telehealth: Payer: Self-pay

## 2022-04-06 NOTE — Telephone Encounter (Signed)
Pt read mychart message.

## 2022-04-06 NOTE — Telephone Encounter (Signed)
Called to change [facility] appointment from 10/31 to 11/1 at new location on [facility]/ He stated his Hgb was 10.9 and he didn't think he needed anymore. Wants to cancel 10/31 and wait until he sees MD on 11/10 to see if he needs to reschedule.

## 2022-04-09 ENCOUNTER — Other Ambulatory Visit: Payer: Self-pay | Admitting: Family Medicine

## 2022-04-09 DIAGNOSIS — E119 Type 2 diabetes mellitus without complications: Secondary | ICD-10-CM

## 2022-04-10 ENCOUNTER — Encounter (HOSPITAL_COMMUNITY)
Admission: RE | Admit: 2022-04-10 | Discharge: 2022-04-10 | Disposition: A | Discharge status: Home / Self Care | Payer: Medicare HMO | Source: Ambulatory Visit | Attending: Nephrology | Admitting: Nephrology

## 2022-04-10 DIAGNOSIS — N1832 Chronic kidney disease, stage 3b: Secondary | ICD-10-CM

## 2022-04-13 ENCOUNTER — Other Ambulatory Visit: Payer: Self-pay | Admitting: Family Medicine

## 2022-04-15 NOTE — Progress Notes (Unsigned)
GI Office Note    Referring Provider: Kathyrn Drown, MD Primary Care Physician:  Kathyrn Drown, MD  Primary Gastroenterologist: Garfield Cornea, MD   Chief Complaint   No chief complaint on file.   History of Present Illness   Austin Morgan is a 66 y.o. male presenting today for follow up. Last seen in 02/2022.   History of decompensated etoh cirrhosis. Course has been complicated by HE, variceal bleeding. Last EGD 10/2021 with Grade 1 esophageal varcies, stigmata of bleeding possible, portal gastropathy. Recommended six month surveillance. Currently on nadolol '20mg'$  daily. He also has h/o IDA, followed by hematology for IV iron prn. Attempt for paracentesis 02/26/22 (no evidence of ascites on u/s).    Due hepatoma screening 04/2022. H/o multiple adenomatous colon polyps, due for surveillance colonoscopy in 2024.    He was seen by Dr. Zollie Scale at [facility] 04/03/22.   ***cardiac evaluation by pcp, full cardiac evaluation at time of transplant eval  ***update labs document MELD Na 14 MELD 3.0 15  Medications   Current Outpatient Medications  Medication Sig Dispense Refill   albuterol (VENTOLIN HFA) 108 (90 Base) MCG/ACT inhaler Inhale 2 puffs into the lungs every 6 (six) hours as needed for wheezing. 1 each 2   Continuous Blood Gluc Receiver (DEXCOM G6 RECEIVER) DEVI 1 Device by Does not apply route continuous. 1 each 1   Continuous Blood Gluc Sensor (DEXCOM G6 SENSOR) MISC 1 Units by Does not apply route continuous. 3 each 1   cyanocobalamin (VITAMIN B12) 500 MCG tablet Take 500 mcg by mouth every Monday, Wednesday, and Friday.     ferrous sulfate 325 (65 FE) MG EC tablet Take 325 mg by mouth every morning.     furosemide (LASIX) 40 MG tablet Take 1 tablet (40 mg total) by mouth 2 (two) times daily. 30 tablet 1   insulin aspart (NOVOLOG FLEXPEN) 100 UNIT/ML FlexPen Inject 5 units into the skin with meals twice a day 15 mL 2   insulin detemir (LEVEMIR FLEXPEN) 100 UNIT/ML  FlexPen INJECT 50 UNITS INTO THE SKIN EVERY EVENING MAY TITRATE UP TO 60 UNITS 15 mL 0   lactulose (CHRONULAC) 10 GM/15ML solution Take 30 mLs (20 g total) by mouth 3 (three) times daily. 2700 mL 5   metolazone (ZAROXOLYN) 5 MG tablet Take 5 mg by mouth daily.     Multiple Vitamins-Minerals (MULTIVITAMIN WITH MINERALS) tablet Take 1 tablet by mouth daily.     nadolol (CORGARD) 40 MG tablet Take 1/2 (one half) tablet po daily (Patient taking differently: Take 20 mg by mouth daily.) 45 tablet 1   ondansetron (ZOFRAN) 8 MG tablet TAKE 1 TABLET BY MOUTH THREE TIMES DAILY AS NEEDED FOR NAUSEA (Patient taking differently: Take 8 mg by mouth every 8 (eight) hours as needed for nausea.) 20 tablet 3   pantoprazole (PROTONIX) 40 MG tablet Take 1 tablet (40 mg total) by mouth 2 (two) times daily. 60 tablet 3   pramipexole (MIRAPEX) 0.25 MG tablet Take 1 tablet (0.25 mg total) by mouth at bedtime. 90 tablet 1   scopolamine (TRANSDERM-SCOP) 1 MG/3DAYS Place 1 patch (1.5 mg total) onto the skin every 3 (three) days. 4 patch 0   spironolactone (ALDACTONE) 25 MG tablet Take 25 mg by mouth daily.     tadalafil (CIALIS) 20 MG tablet Take 0.5-1 tablets (10-20 mg total) by mouth every other day as needed for erectile dysfunction. 6 tablet 11   traZODone (DESYREL) 100 MG tablet  Take 1 tablet (100 mg total) by mouth at bedtime as needed. for sleep 90 tablet 3   No current facility-administered medications for this visit.    Allergies   Allergies as of 04/16/2022 - Review Complete 03/27/2022  Allergen Reaction Noted   Codeine Nausea And Vomiting 11/26/2008     Past Medical History   Past Medical History:  Diagnosis Date   Anxiety    Arthritis    Depression    Diabetes mellitus without complication ([place])    ED (erectile dysfunction)    Gout    H/O ETOH abuse    Hyperlipidemia    Hypertension    Hypertriglyceridemia    Iron deficiency anemia due to chronic blood loss 12/20/2021   PONV (postoperative  nausea and vomiting)    Sleep apnea     Past Surgical History   Past Surgical History:  Procedure Laterality Date   CARPAL TUNNEL RELEASE Bilateral    COLONOSCOPY  2010   Dr. Delfin Edis: mild diverticulosis, next colonoscopy 2020   COLONOSCOPY WITH PROPOFOL N/A 01/21/2020   Rourk: 9 polyps removed, multiple tubular adenomas.  Next colonoscopy in 3 years.   ELBOW SURGERY     ESOPHAGEAL BANDING  09/28/2021   Procedure: ESOPHAGEAL BANDING;  Surgeon: [name], MD;  [facility];  Service: Endoscopy;;   ESOPHAGOGASTRODUODENOSCOPY (EGD) WITH PROPOFOL N/A 01/21/2020   R[facility]k: normal   ESOPHAGOGASTRODUODENOSCOPY (EGD) WITH PROPOFOL N/A 09/28/2021   Procedure: ESOPHAGOGASTRODUODENOSCOPY (EGD) WITH PROPOFOL;  Surgeon: [name], MD;  [facility];  Service: Endoscopy;  Laterality: N/A;  3:00pm   ESOPHAGOGASTRODUODENOSCOPY (EGD) WITH PROPOFOL N/A 10/31/2021   Procedure: ESOPHAGOGASTRODUODENOSCOPY (EGD) WITH PROPOFOL;  Surgeon: Harvel Quale, MD;  Location: [facility];  Service: Gastroenterology;  Laterality: N/A;   NECK SURGERY     c4   POLYPECTOMY  01/21/2020   Procedure: POLYPECTOMY;  Surgeon: [name], MD;  Location: [facility];  Service: Endoscopy;;  colon    Past Family History   Family History  Problem Relation Age of Onset   Heart attack Father    Heart Problems Brother        Had open heart surgery   Colon cancer Neg Hx     Past Social History   Social History   Socioeconomic History   Marital status: Married    Spouse name: Not on file   Number of children: Not on file   Years of education: Not on file   Highest education level: Not on file  Occupational History   Not on file  Tobacco Use   Smoking status: Former    Packs/day: 1.00    Years: 36.00    Total pack years: 36.00    Types: Cigarettes    Quit date: 03/11/2016    Years since quitting: 6.0    Passive exposure: Past   Smokeless tobacco: Never   Vaping Use   Vaping Use: Never used  Substance and Sexual Activity   Alcohol use: Not Currently    Comment: drinks a fifth of liquor per week; inpatient rehab 08/2019. denied 10/14/19. No etoh since 03/2021 (as of 09/22/21)   Drug use: No   Sexual activity: Yes  Other Topics Concern   Not on file  Social History Narrative   Wife, Rashed Edler NP Palliative care of [place]   Social Determinants of Health   Financial Resource Strain: Low Risk  (03/23/2022)   Overall Financial Resource Strain (CARDIA)  Difficulty of Paying Living Expenses: Not hard at all  Food Insecurity: No Food Insecurity (03/23/2022)   Hunger Vital Sign    Worried About Running Out of Food in the Last Year: Never true    Ran Out of Food in the Last Year: Never true  Transportation Needs: No Transportation Needs (03/23/2022)   PRAPARE - Hydrologist (Medical): No    Lack of Transportation (Non-Medical): No  Physical Activity: Not on file  Stress: No Stress Concern Present (03/23/2022)   [place]    Feeling of Stress : Not at all  Social Connections: Not on file  Intimate Partner Violence: Not At Risk (03/23/2022)   Humiliation, Afraid, Rape, and Kick questionnaire    Fear of Current or Ex-Partner: No    Emotionally Abused: No    Physically Abused: No    Sexually Abused: No    Review of Systems   General: Negative for anorexia, weight loss, fever, chills, fatigue, weakness. ENT: Negative for hoarseness, difficulty swallowing , nasal congestion. CV: Negative for chest pain, angina, palpitations, dyspnea on exertion, peripheral edema.  Respiratory: Negative for dyspnea at rest, dyspnea on exertion, cough, sputum, wheezing.  GI: See history of present illness. GU:  Negative for dysuria, hematuria, urinary incontinence, urinary frequency, nocturnal urination.  Endo: Negative for unusual weight change.      Physical Exam   There were no vitals taken for this visit.   General: Well-nourished, well-developed in no acute distress.  Eyes: No icterus. Mouth: Oropharyngeal mucosa moist and pink , no lesions erythema or exudate. Lungs: Clear to auscultation bilaterally.  Heart: Regular rate and rhythm, no murmurs rubs or gallops.  Abdomen: Bowel sounds are normal, nontender, nondistended, no hepatosplenomegaly or masses,  no abdominal bruits or hernia , no rebound or guarding.  Rectal: ***  Extremities: No lower extremity edema. No clubbing or deformities. Neuro: Alert and oriented x 4   Skin: Warm and dry, no jaundice.   Psych: Alert and cooperative, normal mood and affect.  Labs   *** Imaging Studies   No results found.  Assessment       PLAN   ***   Laureen Ochs. Bobby Rumpf, [place], [facility]

## 2022-04-16 ENCOUNTER — Ambulatory Visit (INDEPENDENT_AMBULATORY_CARE_PROVIDER_SITE_OTHER): Payer: Medicare HMO | Admitting: Gastroenterology

## 2022-04-16 ENCOUNTER — Encounter: Payer: Self-pay | Admitting: Gastroenterology

## 2022-04-16 VITALS — BP 118/73 | HR 65 | Temp 97.3°F | Ht 65.0 in | Wt 184.2 lb

## 2022-04-16 DIAGNOSIS — D500 Iron deficiency anemia secondary to blood loss (chronic): Secondary | ICD-10-CM | POA: Diagnosis not present

## 2022-04-16 DIAGNOSIS — E1165 Type 2 diabetes mellitus with hyperglycemia: Secondary | ICD-10-CM | POA: Diagnosis not present

## 2022-04-16 DIAGNOSIS — R69 Illness, unspecified: Secondary | ICD-10-CM | POA: Diagnosis not present

## 2022-04-16 DIAGNOSIS — K7030 Alcoholic cirrhosis of liver without ascites: Secondary | ICD-10-CM | POA: Diagnosis not present

## 2022-04-16 NOTE — Patient Instructions (Signed)
Looks like Dr. Wolfgang Phoenix is actually working on your refill for nadolol so I did not refill today. Let me know if any issues.  You are due for ultrasound of your liver this month (for your six month follow up). We will call to schedule.  Continue iron, lactulose, nadolol, xifaxan as before. Continue spironolactone and furosemide as before. Try cutting back to pantoprazole once daily as needed for reflux. Return office visit in 2 months.

## 2022-04-18 ENCOUNTER — Telehealth: Payer: Self-pay | Admitting: Family Medicine

## 2022-04-18 NOTE — Telephone Encounter (Signed)
Patient has been made aware of his medication being available at [facility]

## 2022-04-18 NOTE — Telephone Encounter (Signed)
Can you resend nadolol (CORGARD) 40 MG tab  to [facility].. patient just called and stated that didn't have his medication and he is leaving on Friday to go out of counrty.

## 2022-04-19 ENCOUNTER — Encounter: Payer: Self-pay | Admitting: *Deleted

## 2022-04-19 ENCOUNTER — Telehealth: Payer: Self-pay | Admitting: *Deleted

## 2022-04-19 ENCOUNTER — Other Ambulatory Visit: Payer: Self-pay

## 2022-04-19 DIAGNOSIS — D500 Iron deficiency anemia secondary to blood loss (chronic): Secondary | ICD-10-CM

## 2022-04-19 DIAGNOSIS — N1832 Chronic kidney disease, stage 3b: Secondary | ICD-10-CM

## 2022-04-19 DIAGNOSIS — D696 Thrombocytopenia, unspecified: Secondary | ICD-10-CM

## 2022-04-19 DIAGNOSIS — D631 Anemia in chronic kidney disease: Secondary | ICD-10-CM

## 2022-04-19 NOTE — Progress Notes (Signed)
Austin Morgan, [place] [address]   CLINIC:  Medical Oncology/Hematology  PCP:  Kathyrn Drown, MD [address] [place] [address] [phone]   REASON FOR VISIT:  Follow-up for normocytic anemia (iron deficiency + CKD), thrombocytopenia, and leukopenia  CURRENT THERAPY: Intermittent iron infusions  INTERVAL HISTORY:  Austin Morgan 66 y.o. male returns for routine follow-up of his iron deficiency anemia, anemia of CKD, thrombocytopenia, and leukopenia.  He was last seen by Tarri Abernethy PA-C on 02/20/2022.  At today's visit, he reports feeling fair.  Since his last visit he was hospitalized from 02/24/2022 through 02/27/2022 for hepatic encephalopathy and AKI on CKD stage IIIb.  He denies any other hospitalizations, surgeries, or changes in his baseline health status.  He reports some slight improvement in energy after his IV iron in September/October, but has had worsening fatigue following his hospital stay.  He continues to report ice pica and restless legs.  He denies any headaches, chest pain, dyspnea on exertion, palpitations, lightheadedness, or syncope.  Patient denies any hematemesis, melena, or hematochezia since his last visit.   He has occasional nosebleeds. He admits to easy bruising but denies petechial rash.  No frequent infections or B symptoms such as fever, chills, night sweats, unintentional weight loss.  He denies any new bone pain or neurologic changes. He is taking daily iron supplement/vitamin C, takes it at separate times in his Protonix.     He has 70% energy and 100% appetite. He endorses that he is maintaining a stable weight.   REVIEW OF SYSTEMS:  Review of Systems  Constitutional:  Positive for fatigue. Negative for appetite change, chills, diaphoresis, fever and unexpected weight change.  HENT:   Negative for lump/mass and nosebleeds.   Eyes:  Negative for eye problems.  Respiratory:  Negative for cough,  hemoptysis and shortness of breath.   Cardiovascular:  Negative for chest pain, leg swelling and palpitations.  Gastrointestinal:  Negative for abdominal pain, blood in stool, constipation, diarrhea, nausea and vomiting.  Genitourinary:  Negative for hematuria.   Musculoskeletal:  Positive for arthralgias.  Skin: Negative.   Neurological:  Positive for numbness. Negative for dizziness, headaches and light-headedness.  Hematological:  Does not bruise/bleed easily.      PAST MEDICAL/SURGICAL HISTORY:  Past Medical History:  Diagnosis Date   Anxiety    Arthritis    Depression    Diabetes mellitus without complication ([place])    ED (erectile dysfunction)    Gout    H/O ETOH abuse    Hyperlipidemia    Hypertension    Hypertriglyceridemia    Iron deficiency anemia due to chronic blood loss 12/20/2021   PONV (postoperative nausea and vomiting)    Sleep apnea    Past Surgical History:  Procedure Laterality Date   CARPAL TUNNEL RELEASE Bilateral    COLONOSCOPY  2010   Dr. Delfin Edis: mild diverticulosis, next colonoscopy 2020   COLONOSCOPY WITH PROPOFOL N/A 01/21/2020   Rourk: 9 polyps removed, multiple tubular adenomas.  Next colonoscopy in 3 years.   ELBOW SURGERY     ESOPHAGEAL BANDING  09/28/2021   Procedure: ESOPHAGEAL BANDING;  Surgeon: [name], MD;  [facility];  Service: Endoscopy;;   ESOPHAGOGASTRODUODENOSCOPY (EGD) WITH PROPOFOL N/A 01/21/2020   [place]k: normal   ESOPHAGOGASTRODUODENOSCOPY (EGD) WITH PROPOFOL N/A 09/28/2021   Procedure: ESOPHAGOGASTRODUODENOSCOPY (EGD) WITH PROPOFOL;  Surgeon: [name], MD;  [facility];  Service: Endoscopy;  Laterality: N/A;  3:00pm   ESOPHAGOGASTRODUODENOSCOPY (EGD) WITH PROPOFOL N/A 10/31/2021   Procedure: ESOPHAGOGASTRODUODENOSCOPY (EGD) WITH PROPOFOL;  Surgeon: Harvel Quale, MD;  [facility];  Service: Gastroenterology;  Laterality: N/A;   NECK SURGERY     c4   POLYPECTOMY   01/21/2020   Procedure: POLYPECTOMY;  Surgeon: [name], MD;  [facility];  Service: Endoscopy;;  colon     SOCIAL HISTORY:  Social History   Socioeconomic History   Marital status: Married    Spouse name: Not on file   Number of children: Not on file   Years of education: Not on file   Highest education level: Not on file  Occupational History   Not on file  Tobacco Use   Smoking status: Former    Packs/day: 1.00    Years: 36.00    Total pack years: 36.00    Types: Cigarettes    Quit date: 03/11/2016    Years since quitting: 6.1    Passive exposure: Past   Smokeless tobacco: Never  Vaping Use   Vaping Use: Never used  Substance and Sexual Activity   Alcohol use: Not Currently    Comment: drinks a fifth of liquor per week; inpatient rehab 08/2019. denied 10/14/19. No etoh since 03/2021 (as of 09/22/21)   Drug use: No   Sexual activity: Yes  Other Topics Concern   Not on file  Social History Narrative   Wife, Farouk Vivero NP [facility]   Social Determinants of Health   Financial Resource Strain: Low Risk  (03/23/2022)   Overall Financial Resource Strain (CARDIA)    Difficulty of Paying Living Expenses: Not hard at all  Food Insecurity: No Food Insecurity (03/23/2022)   Hunger Vital Sign    Worried About Running Out of Food in the Last Year: Never true    Ran Out of Food in the Last Year: Never true  Transportation Needs: No Transportation Needs (03/23/2022)   PRAPARE - Hydrologist (Medical): No    Lack of Transportation (Non-Medical): No  Physical Activity: Not on file  Stress: No Stress Concern Present (03/23/2022)   [place]    Feeling of Stress : Not at all  Social Connections: Not on file  Intimate Partner Violence: Not At Risk (03/23/2022)   Humiliation, Afraid, Rape, and Kick questionnaire    Fear of Current or  Ex-Partner: No    Emotionally Abused: No    Physically Abused: No    Sexually Abused: No    FAMILY HISTORY:  Family History  Problem Relation Age of Onset   Heart attack Father    Heart Problems Brother        Had open heart surgery   Colon cancer Neg Hx     CURRENT MEDICATIONS:  Outpatient Encounter Medications as of 04/20/2022  Medication Sig   albuterol (VENTOLIN HFA) 108 (90 Base) MCG/ACT inhaler Inhale 2 puffs into the lungs every 6 (six) hours as needed for wheezing.   Continuous Blood Gluc Receiver (DEXCOM G6 RECEIVER) DEVI 1 Device by Does not apply route continuous.   Continuous Blood Gluc Sensor (DEXCOM G6 SENSOR) MISC 1 Units by Does not apply route continuous.   cyanocobalamin (VITAMIN B12) 500 MCG tablet Take 500 mcg by mouth every Monday, Wednesday, and Friday.   ferrous sulfate 325 (65 FE) MG EC tablet Take 325 mg by mouth every morning.  furosemide (LASIX) 40 MG tablet Take 1 tablet (40 mg total) by mouth 2 (two) times daily.   insulin aspart (NOVOLOG FLEXPEN) 100 UNIT/ML FlexPen Inject 5 units into the skin with meals twice a day   insulin detemir (LEVEMIR FLEXPEN) 100 UNIT/ML FlexPen INJECT 50 UNITS INTO THE SKIN EVERY EVENING MAY TITRATE UP TO 60 UNITS   lactulose (CHRONULAC) 10 GM/15ML solution Take 30 mLs (20 g total) by mouth 3 (three) times daily.   metolazone (ZAROXOLYN) 5 MG tablet Take 5 mg by mouth daily. Mon, Wed and Friday   Multiple Vitamins-Minerals (MULTIVITAMIN WITH MINERALS) tablet Take 1 tablet by mouth daily.   nadolol (CORGARD) 40 MG tablet Take 1/2 (one-half) tablet by mouth once daily   ondansetron (ZOFRAN) 8 MG tablet TAKE 1 TABLET BY MOUTH THREE TIMES DAILY AS NEEDED FOR NAUSEA (Patient taking differently: Take 8 mg by mouth every 8 (eight) hours as needed for nausea.)   pantoprazole (PROTONIX) 40 MG tablet Take 1 tablet (40 mg total) by mouth 2 (two) times daily.   pramipexole (MIRAPEX) 0.25 MG tablet Take 1 tablet (0.25 mg total) by mouth at  bedtime.   rifaximin (XIFAXAN) 550 MG TABS tablet Take 550 mg by mouth 2 (two) times daily.   scopolamine (TRANSDERM-SCOP) 1 MG/3DAYS Place 1 patch (1.5 mg total) onto the skin every 3 (three) days.   spironolactone (ALDACTONE) 25 MG tablet Take 25 mg by mouth daily.   tadalafil (CIALIS) 20 MG tablet Take 0.5-1 tablets (10-20 mg total) by mouth every other day as needed for erectile dysfunction.   traZODone (DESYREL) 100 MG tablet Take 1 tablet (100 mg total) by mouth at bedtime as needed. for sleep   No facility-administered encounter medications on file as of 04/20/2022.    ALLERGIES:  Allergies  Allergen Reactions   Codeine Nausea And Vomiting     PHYSICAL EXAM:  ECOG PERFORMANCE STATUS: 1 - Symptomatic but completely ambulatory  There were no vitals filed for this visit. There were no vitals filed for this visit. Physical Exam Constitutional:      Appearance: Normal appearance. He is obese.  HENT:     Head: Normocephalic and atraumatic.     Mouth/Throat:     Mouth: Mucous membranes are moist.  Eyes:     Extraocular Movements: Extraocular movements intact.     Pupils: Pupils are equal, round, and reactive to light.  Cardiovascular:     Rate and Rhythm: Normal rate and regular rhythm.     Pulses: Normal pulses.     Heart sounds: Normal heart sounds.  Pulmonary:     Effort: Pulmonary effort is normal.     Breath sounds: Normal breath sounds.  Abdominal:     General: Bowel sounds are normal.     Palpations: Abdomen is soft.     Tenderness: There is no abdominal tenderness.  Musculoskeletal:        General: No swelling.     Right lower leg: Edema (1+) present.     Left lower leg: Edema (1+) present.  Lymphadenopathy:     Cervical: No cervical adenopathy.  Skin:    General: Skin is warm and dry.  Neurological:     General: No focal deficit present.     Mental Status: He is alert and oriented to person, place, and time.  Psychiatric:        Mood and Affect: Mood  normal.        Behavior: Behavior normal.     LABORATORY  DATA:  I have reviewed the labs as listed.  CBC    Component Value Date/Time   WBC 5.0 03/02/2022 1158   WBC 5.6 02/27/2022 0514   RBC 2.94 (L) 03/02/2022 1158   RBC 2.54 (L) 02/27/2022 0514   HGB 10.9 (L) 03/27/2022 1316   HGB 9.1 (L) 03/02/2022 1158   HCT 26.8 (L) 03/02/2022 1158   PLT 128 (L) 03/02/2022 1158   MCV 91 03/02/2022 1158   MCH 31.0 03/02/2022 1158   MCH 31.9 02/27/2022 0514   MCHC 34.0 03/02/2022 1158   MCHC 33.3 02/27/2022 0514   RDW 12.2 03/02/2022 1158   LYMPHSABS 1.1 03/02/2022 1158   MONOABS 0.8 02/25/2022 0614   EOSABS 0.4 03/02/2022 1158   BASOSABS 0.0 03/02/2022 1158      Latest Ref Rng & Units 03/02/2022   11:58 AM 02/27/2022    5:14 AM 02/26/2022    5:22 AM  CMP  Glucose 70 - 99 mg/dL 94  118  65   BUN 8 - 27 mg/dL 31  34  39   Creatinine 0.76 - 1.27 mg/dL 1.97  2.10  2.41   Sodium 134 - 144 mmol/L 137  138  140   Potassium 3.5 - 5.2 mmol/L 3.9  3.4  2.8   Chloride 96 - 106 mmol/L 100  110  108   CO2 20 - 29 mmol/L '18  20  22   '$ Calcium 8.6 - 10.2 mg/dL 8.9  8.7  8.9   Total Protein 6.0 - 8.5 g/dL 7.1  6.5  6.6   Total Bilirubin 0.0 - 1.2 mg/dL 0.6  1.3  1.1   Alkaline Phos 44 - 121 IU/L 160  92  101   AST 0 - 40 IU/L 33  36  32   ALT 0 - 44 IU/L '21  17  17     '$ DIAGNOSTIC IMAGING:  I have independently reviewed the relevant imaging and discussed with the patient.  ASSESSMENT & PLAN: 1.  Normocytic anemia secondary to iron deficiency and CKD stage IIIb/IV -  Normocytic anemia since 2021, with significant worsening of his anemia in April 2023 (Hgb 5.6) during admission to hospital for GI bleeding.   - Hematology work-up (12/20/2021): Reticulocytes 1.5% (hypoproliferative in the setting of moderate anemia) LDH normal. Ferritin 31, iron saturation 21%. Normal folate, copper. Normal B12 781 with mildly elevated methylmalonic acid 411. SPEP negative.  Immunofixation shows polyclonal  gammopathy.   - Hospitalized in April 2023 for GI bleeding secondary to varices, requiring esophageal variceal banding and PRBC transfusion x 3.  He was hospitalized again in May 2023 with volume overload and symptomatic anemia requiring repeat EGD (10/31/2021) which showed portal gastropathy and grade 1 varices - he received Venofer 300 mg during hospitalization in May. - Follows with [facility] Neil Crouch, PA-C and Dr. Gala [place]) for his alcohol-related liver cirrhosis and GI bleeding. - He has been taking oral iron supplementation since June 2023, although absorption is somewhat limited by his Protonix - Most recent IV Venofer 300 mg x 3 from 02/27/2022 through 03/13/2022 - Receiving Procrit via Dr. Theador Hawthorne, but has not required injection since 02/22/2022 due to Hgb >10.0  - Denies any recent hematemesis, melena, or hematochezia. - Symptomatic with fatigue, pica, restless legs - Labs today (04/20/2022): Hgb 10.7/MCV 92.4, ferritin 154, iron saturation 34%.  Baseline CKD stage IIIb/IV with creatinine 2.25/GFR 32 - Differential diagnosis favors iron deficiency anemia secondary to GI blood loss as well as anemia secondary  to his CKD stage IIIb - PLAN: No indication for IV iron at this time - Patient can continue to take oral iron supplementation, recommended to take this at least 4 hours separate from his Protonix  - Continue taking vitamin B12 500 mcg 3 times weekly (due to elevated MMA as above)  - Procrit as per nephrologist - Labs in 3 months with office visit 1 week after labs   2.  Thrombocytopenia and leukopenia (cirrhosis and splenomegaly) - Intermittent mild leukopenia since April 2023.  He has had mild thrombocytopenia since June 2022, with most recent platelets (12/13/2021) at 41.   - Nephrology testing for hepatitis B, hepatitis C, and HIV in June 2023 was negative - Hematology work-up (12/20/2021): Reticulocytes 1.5% (hypoproliferative in the setting of moderate  anemia) LDH normal. Ferritin 31, iron saturation 21%. Normal folate, copper. Normal B12 781 with mildly elevated methylmalonic acid 411. SPEP negative for M spike.  Immunofixation shows polyclonal gammopathy.  - No history of autoimmune or connective tissue disorder. - Patient has liver cirrhosis and splenomegaly - Follows with [facility] Neil Crouch, PA-C and Dr. Gala Romney) for his alcohol-related liver cirrhosis and GI bleeding. - Most recent abdominal ultrasound (12/07/2020) showed splenomegaly measuring 14 cm with volume 678 cc. - Admits to easy bruising, no petechial rash.  Occasional mild epistaxis. - Labs today (04/20/2022): Platelets 120, WBC 4.3 w/ normal differential; at baseline - PLAN: Continue taking vitamin B12 500 mcg 3 times weekly (due to elevated MMA as above)  - No strong indication for treatment of leukopenia or thrombocytopenia at this time.  We will continue to monitor.   3.  Elevated free light chains - Labs obtained by nephrologist (11/15/2021) show elevated free serum light chains with elevated kappa 168.3, elevated lambda 92.6, and elevated ratio 1.82. - 24-hour urine (11/15/2021) showed elevated urine light chains with kappa 96.75 and lambda 9.02 with ratio 10.73.  Urine immunofixation showed polyclonal light chains but was negative for monoclonal immunoglobulin or Bence-Jones protein. - Hematology work-up (12/20/2021):  SPEP negative for M spike.  Immunofixation shows polyclonal gammopathy.  Elevated kappa light chains 138.0, elevated lambda 78.5, with elevated ratio 1.76 (in keeping with CKD) - He denies any new bone pain or B symptoms.   - PLAN: Elevated light chains secondary to CKD.  No further work-up of plasma cell dyscrasias at this time.   4.  Other history - PMH: CKD stage 3b, type 2 diabetes mellitus, anemia of chronic disease, hypertension, alcoholic liver cirrhosis, history of GI bleed from esophageal varices, restless legs, sleep  apnea, and chronic diastolic CHF. 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 - SUBSTANCE: History of heavy alcohol use for 20 years, but has been sober since October 2022.  He smoked 0.5 PPD cigarettes for 30 years, but quit 10+ years ago.  He has history of drug use many years ago, but denies any IV drug use. - FAMILY: No family history of blood abnormalities or cancer.  PLAN SUMMARY & DISPOSITION: >> Labs in 3 months (CBC/D, CMP, ferritin, iron/TIBC, B12, MMA) >> Office visit 1 week after labs  All questions were answered. The patient knows to call the clinic with any problems, questions or concerns.  Medical decision making: Moderate  Time spent on visit: I spent 20 minutes counseling the patient face to face. The total time spent in the appointment was 30 minutes and more than 50% was on counseling.  Harriett Rush, PA-C  04/20/2022 10:27 AM

## 2022-04-19 NOTE — Telephone Encounter (Signed)
Pt ultrasound scheduled for 05/01/22 at 9:30 am, arrive at 9:15 am, nothing to eat or drink 6-8 hours prior to procedure. Pt informed.

## 2022-04-20 ENCOUNTER — Inpatient Hospital Stay: Payer: Medicare HMO | Attending: Hematology | Admitting: Physician Assistant

## 2022-04-20 ENCOUNTER — Inpatient Hospital Stay: Payer: Medicare HMO

## 2022-04-20 ENCOUNTER — Other Ambulatory Visit: Payer: Self-pay

## 2022-04-20 VITALS — BP 108/64 | HR 62 | Temp 97.5°F | Resp 16 | Wt 184.1 lb

## 2022-04-20 DIAGNOSIS — D696 Thrombocytopenia, unspecified: Secondary | ICD-10-CM | POA: Insufficient documentation

## 2022-04-20 DIAGNOSIS — N1832 Chronic kidney disease, stage 3b: Secondary | ICD-10-CM

## 2022-04-20 DIAGNOSIS — D631 Anemia in chronic kidney disease: Secondary | ICD-10-CM | POA: Insufficient documentation

## 2022-04-20 DIAGNOSIS — D61818 Other pancytopenia: Secondary | ICD-10-CM | POA: Diagnosis not present

## 2022-04-20 DIAGNOSIS — D500 Iron deficiency anemia secondary to blood loss (chronic): Secondary | ICD-10-CM

## 2022-04-20 DIAGNOSIS — R69 Illness, unspecified: Secondary | ICD-10-CM | POA: Diagnosis not present

## 2022-04-20 DIAGNOSIS — R161 Splenomegaly, not elsewhere classified: Secondary | ICD-10-CM | POA: Insufficient documentation

## 2022-04-20 DIAGNOSIS — K7030 Alcoholic cirrhosis of liver without ascites: Secondary | ICD-10-CM | POA: Diagnosis not present

## 2022-04-20 DIAGNOSIS — D72819 Decreased white blood cell count, unspecified: Secondary | ICD-10-CM | POA: Diagnosis not present

## 2022-04-20 DIAGNOSIS — D472 Monoclonal gammopathy: Secondary | ICD-10-CM | POA: Insufficient documentation

## 2022-04-20 DIAGNOSIS — E1122 Type 2 diabetes mellitus with diabetic chronic kidney disease: Secondary | ICD-10-CM | POA: Diagnosis not present

## 2022-04-20 DIAGNOSIS — I129 Hypertensive chronic kidney disease with stage 1 through stage 4 chronic kidney disease, or unspecified chronic kidney disease: Secondary | ICD-10-CM | POA: Diagnosis not present

## 2022-04-20 DIAGNOSIS — Z87891 Personal history of nicotine dependence: Secondary | ICD-10-CM | POA: Insufficient documentation

## 2022-04-20 LAB — IRON AND TIBC
Iron: 104 ug/dL (ref 45–182)
Saturation Ratios: 34 % (ref 17.9–39.5)
TIBC: 302 ug/dL (ref 250–450)
UIBC: 198 ug/dL

## 2022-04-20 LAB — COMPREHENSIVE METABOLIC PANEL
ALT: 28 U/L (ref 0–44)
AST: 34 U/L (ref 15–41)
Albumin: 3.7 g/dL (ref 3.5–5.0)
Alkaline Phosphatase: 138 U/L — ABNORMAL HIGH (ref 38–126)
Anion gap: 10 (ref 5–15)
BUN: 62 mg/dL — ABNORMAL HIGH (ref 8–23)
CO2: 23 mmol/L (ref 22–32)
Calcium: 9.7 mg/dL (ref 8.9–10.3)
Chloride: 103 mmol/L (ref 98–111)
Creatinine, Ser: 2.25 mg/dL — ABNORMAL HIGH (ref 0.61–1.24)
GFR, Estimated: 32 mL/min — ABNORMAL LOW (ref >60)
Glucose, Bld: 270 mg/dL — ABNORMAL HIGH (ref 70–99)
Potassium: 4.1 mmol/L (ref 3.5–5.1)
Sodium: 136 mmol/L (ref 135–145)
Total Bilirubin: 1.1 mg/dL (ref 0.3–1.2)
Total Protein: 7.7 g/dL (ref 6.5–8.1)

## 2022-04-20 LAB — CBC WITH DIFFERENTIAL/PLATELET
Abs Immature Granulocytes: 0.02 10*3/uL (ref 0.00–0.07)
Basophils Absolute: 0 10*3/uL (ref 0.0–0.1)
Basophils Relative: 1 %
Eosinophils Absolute: 0.3 10*3/uL (ref 0.0–0.5)
Eosinophils Relative: 7 %
HCT: 30.6 % — ABNORMAL LOW (ref 39.0–52.0)
Hemoglobin: 10.7 g/dL — ABNORMAL LOW (ref 13.0–17.0)
Immature Granulocytes: 1 %
Lymphocytes Relative: 20 %
Lymphs Abs: 0.9 10*3/uL (ref 0.7–4.0)
MCH: 32.3 pg (ref 26.0–34.0)
MCHC: 35 g/dL (ref 30.0–36.0)
MCV: 92.4 fL (ref 80.0–100.0)
Monocytes Absolute: 0.5 10*3/uL (ref 0.1–1.0)
Monocytes Relative: 11 %
Neutro Abs: 2.7 10*3/uL (ref 1.7–7.7)
Neutrophils Relative %: 60 %
Platelets: 120 10*3/uL — ABNORMAL LOW (ref 150–400)
RBC: 3.31 MIL/uL — ABNORMAL LOW (ref 4.22–5.81)
RDW: 13.4 % (ref 11.5–15.5)
WBC: 4.3 10*3/uL (ref 4.0–10.5)
nRBC: 0 % (ref 0.0–0.2)

## 2022-04-20 LAB — FERRITIN: Ferritin: 154 ng/mL (ref 24–336)

## 2022-04-20 NOTE — Patient Instructions (Signed)
[facility] [place] **   You were seen today by Tarri Abernethy PA-C for your follow-up visit.    ANEMIA: Your anemia is due to your iron deficiency and your chronic kidney disease. You do not need any IV iron at this time.  Continue to take your iron tablet at home daily. Dr. Theador Hawthorne has started you on Procrit injections.  Continue these injections per his instructions.  LOW PLATELETS: Your low platelets are most likely due to your cirrhosis. Your labs did show mild abnormality of your B12 labs (elevated methylmalonic acid). You should start taking low-dose vitamin B12 500 mcg 3 days weekly (Monday/Wednesday/Friday)  FOLLOW-UP APPOINTMENT: We will check labs in 3 months and see you for an office visit 1 week after labs.  ** Thank you for trusting me with your healthcare!  I strive to provide all of my patients with quality care at each visit.  If you receive a survey for this visit, I would be so grateful to you for taking the time to provide feedback.  Thank you in advance!  ~ Terasa Orsini                   Dr. Derek Jack   &   Tarri Abernethy, PA-C   - - - - - - - - - - - - - - - - - -    Thank you for choosing [place] at [facility] to provide your oncology and hematology care.  To afford each patient quality time with our provider, please arrive at least 15 minutes before your scheduled appointment time.   If you have a lab appointment with the [facility] please come in thru the Main Entrance and check in at the main information desk.  You need to re-schedule your appointment should you arrive 10 or more minutes late.  We strive to give you quality time with our providers, and arriving late affects you and other patients whose appointments are after yours.  Also, if you no show three or more times for appointments you may be dismissed from the clinic at the providers discretion.      Again, thank you for choosing [facility].  Our hope is that these requests will decrease the amount of time that you wait before being seen by our physicians.       _____________________________________________________________  Should you have questions after your visit to [facility], please contact our office at [phone] and follow the prompts.  Our office hours are 8:00 a.m. and 4:30 p.m. Monday - Friday.  Please note that voicemails left after 4:00 p.m. may not be returned until the following business day.  We are closed weekends and major holidays.  You do have access to a nurse 24-7, just call the main number to the clinic [phone] and do not press any options, hold on the line and a nurse will answer the phone.    For prescription refill requests, have your pharmacy contact our office and allow 72 hours.

## 2022-04-22 ENCOUNTER — Encounter: Payer: Self-pay | Admitting: Family Medicine

## 2022-04-30 ENCOUNTER — Encounter: Payer: Self-pay | Admitting: *Deleted

## 2022-05-01 ENCOUNTER — Ambulatory Visit (HOSPITAL_COMMUNITY): Payer: Medicare HMO

## 2022-05-01 ENCOUNTER — Encounter: Payer: Self-pay | Admitting: *Deleted

## 2022-05-07 ENCOUNTER — Other Ambulatory Visit: Payer: Self-pay | Admitting: Family Medicine

## 2022-05-07 DIAGNOSIS — E119 Type 2 diabetes mellitus without complications: Secondary | ICD-10-CM

## 2022-05-08 ENCOUNTER — Encounter (HOSPITAL_COMMUNITY): Payer: Medicare HMO

## 2022-05-10 MED ORDER — LACTULOSE 10 GM/15ML PO SOLN: 20.0000 g | Freq: Four times a day (QID) | ORAL | 5 refills | Status: DC

## 2022-05-14 ENCOUNTER — Ambulatory Visit (HOSPITAL_COMMUNITY)
Admission: RE | Admit: 2022-05-14 | Discharge: 2022-05-14 | Disposition: A | Discharge status: Home / Self Care | Payer: Medicare HMO | Source: Ambulatory Visit | Attending: Gastroenterology | Admitting: Gastroenterology

## 2022-05-14 DIAGNOSIS — R69 Illness, unspecified: Secondary | ICD-10-CM | POA: Diagnosis not present

## 2022-05-14 DIAGNOSIS — K7460 Unspecified cirrhosis of liver: Secondary | ICD-10-CM | POA: Diagnosis not present

## 2022-05-14 DIAGNOSIS — D500 Iron deficiency anemia secondary to blood loss (chronic): Secondary | ICD-10-CM | POA: Insufficient documentation

## 2022-05-14 DIAGNOSIS — K7030 Alcoholic cirrhosis of liver without ascites: Secondary | ICD-10-CM | POA: Diagnosis not present

## 2022-05-16 DIAGNOSIS — E1165 Type 2 diabetes mellitus with hyperglycemia: Secondary | ICD-10-CM | POA: Diagnosis not present

## 2022-05-18 ENCOUNTER — Encounter (HOSPITAL_COMMUNITY)
Admission: RE | Admit: 2022-05-18 | Discharge: 2022-05-18 | Disposition: A | Discharge status: Home / Self Care | Payer: Medicare HMO | Source: Ambulatory Visit | Attending: Internal Medicine | Admitting: Internal Medicine

## 2022-05-22 ENCOUNTER — Other Ambulatory Visit: Payer: Self-pay

## 2022-05-22 ENCOUNTER — Other Ambulatory Visit (HOSPITAL_COMMUNITY)
Admission: RE | Admit: 2022-05-22 | Discharge: 2022-05-22 | Disposition: A | Discharge status: Home / Self Care | Payer: Medicare HMO | Source: Ambulatory Visit | Attending: Nephrology | Admitting: Nephrology

## 2022-05-22 ENCOUNTER — Encounter (HOSPITAL_COMMUNITY): Payer: Self-pay

## 2022-05-22 DIAGNOSIS — I5033 Acute on chronic diastolic (congestive) heart failure: Secondary | ICD-10-CM | POA: Insufficient documentation

## 2022-05-22 DIAGNOSIS — E1129 Type 2 diabetes mellitus with other diabetic kidney complication: Secondary | ICD-10-CM | POA: Insufficient documentation

## 2022-05-22 DIAGNOSIS — N189 Chronic kidney disease, unspecified: Secondary | ICD-10-CM | POA: Diagnosis not present

## 2022-05-22 DIAGNOSIS — K7460 Unspecified cirrhosis of liver: Secondary | ICD-10-CM | POA: Insufficient documentation

## 2022-05-22 DIAGNOSIS — D638 Anemia in other chronic diseases classified elsewhere: Secondary | ICD-10-CM | POA: Diagnosis not present

## 2022-05-22 DIAGNOSIS — R809 Proteinuria, unspecified: Secondary | ICD-10-CM | POA: Diagnosis not present

## 2022-05-22 DIAGNOSIS — D6959 Other secondary thrombocytopenia: Secondary | ICD-10-CM | POA: Insufficient documentation

## 2022-05-22 DIAGNOSIS — E1122 Type 2 diabetes mellitus with diabetic chronic kidney disease: Secondary | ICD-10-CM | POA: Diagnosis not present

## 2022-05-22 DIAGNOSIS — N170 Acute kidney failure with tubular necrosis: Secondary | ICD-10-CM | POA: Insufficient documentation

## 2022-05-22 LAB — PROTEIN / CREATININE RATIO, URINE
Creatinine, Urine: 55.06 mg/dL
Total Protein, Urine: <3 mg/dL

## 2022-05-22 LAB — CBC
HCT: 31.8 % — ABNORMAL LOW (ref 39.0–52.0)
Hemoglobin: 11.1 g/dL — ABNORMAL LOW (ref 13.0–17.0)
MCH: 32.2 pg (ref 26.0–34.0)
MCHC: 34.9 g/dL (ref 30.0–36.0)
MCV: 92.2 fL (ref 80.0–100.0)
Platelets: 104 10*3/uL — ABNORMAL LOW (ref 150–400)
RBC: 3.45 MIL/uL — ABNORMAL LOW (ref 4.22–5.81)
RDW: 13.6 % (ref 11.5–15.5)
WBC: 8.3 10*3/uL (ref 4.0–10.5)
nRBC: 0 % (ref 0.0–0.2)

## 2022-05-22 LAB — RENAL FUNCTION PANEL
Albumin: 4 g/dL (ref 3.5–5.0)
Anion gap: 9 (ref 5–15)
BUN: 60 mg/dL — ABNORMAL HIGH (ref 8–23)
CO2: 27 mmol/L (ref 22–32)
Calcium: 10.2 mg/dL (ref 8.9–10.3)
Chloride: 102 mmol/L (ref 98–111)
Creatinine, Ser: 2.33 mg/dL — ABNORMAL HIGH (ref 0.61–1.24)
GFR, Estimated: 30 mL/min — ABNORMAL LOW (ref >60)
Glucose, Bld: 184 mg/dL — ABNORMAL HIGH (ref 70–99)
Phosphorus: 3.7 mg/dL (ref 2.5–4.6)
Potassium: 4.5 mmol/L (ref 3.5–5.1)
Sodium: 138 mmol/L (ref 135–145)

## 2022-05-22 LAB — IRON AND TIBC
Iron: 98 ug/dL (ref 45–182)
Saturation Ratios: 30 % (ref 17.9–39.5)
TIBC: 331 ug/dL (ref 250–450)
UIBC: 233 ug/dL

## 2022-05-22 LAB — FERRITIN: Ferritin: 184 ng/mL (ref 24–336)

## 2022-05-23 ENCOUNTER — Encounter (HOSPITAL_COMMUNITY)
Admission: RE | Disposition: A | Discharge status: Home / Self Care | Payer: Self-pay | Source: Home / Self Care | Attending: Internal Medicine

## 2022-05-23 ENCOUNTER — Ambulatory Visit (HOSPITAL_COMMUNITY): Payer: Medicare HMO | Admitting: Anesthesiology

## 2022-05-23 ENCOUNTER — Ambulatory Visit (HOSPITAL_COMMUNITY)
Admission: RE | Admit: 2022-05-23 | Discharge: 2022-05-23 | Disposition: A | Discharge status: Home / Self Care | Payer: Medicare HMO | Attending: Internal Medicine | Admitting: Internal Medicine

## 2022-05-23 ENCOUNTER — Ambulatory Visit (HOSPITAL_BASED_OUTPATIENT_CLINIC_OR_DEPARTMENT_OTHER): Payer: Medicare HMO | Admitting: Anesthesiology

## 2022-05-23 ENCOUNTER — Encounter (HOSPITAL_COMMUNITY): Payer: Self-pay | Admitting: Internal Medicine

## 2022-05-23 DIAGNOSIS — K3189 Other diseases of stomach and duodenum: Secondary | ICD-10-CM | POA: Diagnosis not present

## 2022-05-23 DIAGNOSIS — K766 Portal hypertension: Secondary | ICD-10-CM | POA: Diagnosis not present

## 2022-05-23 DIAGNOSIS — Z7984 Long term (current) use of oral hypoglycemic drugs: Secondary | ICD-10-CM | POA: Insufficient documentation

## 2022-05-23 DIAGNOSIS — I8500 Esophageal varices without bleeding: Secondary | ICD-10-CM | POA: Diagnosis not present

## 2022-05-23 DIAGNOSIS — E1151 Type 2 diabetes mellitus with diabetic peripheral angiopathy without gangrene: Secondary | ICD-10-CM | POA: Insufficient documentation

## 2022-05-23 DIAGNOSIS — I110 Hypertensive heart disease with heart failure: Secondary | ICD-10-CM | POA: Insufficient documentation

## 2022-05-23 DIAGNOSIS — K7460 Unspecified cirrhosis of liver: Secondary | ICD-10-CM | POA: Diagnosis not present

## 2022-05-23 DIAGNOSIS — G4730 Sleep apnea, unspecified: Secondary | ICD-10-CM | POA: Diagnosis not present

## 2022-05-23 DIAGNOSIS — D649 Anemia, unspecified: Secondary | ICD-10-CM | POA: Diagnosis not present

## 2022-05-23 DIAGNOSIS — Z87891 Personal history of nicotine dependence: Secondary | ICD-10-CM | POA: Diagnosis not present

## 2022-05-23 DIAGNOSIS — K219 Gastro-esophageal reflux disease without esophagitis: Secondary | ICD-10-CM | POA: Diagnosis not present

## 2022-05-23 DIAGNOSIS — G2581 Restless legs syndrome: Secondary | ICD-10-CM | POA: Insufficient documentation

## 2022-05-23 DIAGNOSIS — I8510 Secondary esophageal varices without bleeding: Secondary | ICD-10-CM | POA: Insufficient documentation

## 2022-05-23 DIAGNOSIS — D369 Benign neoplasm, unspecified site: Secondary | ICD-10-CM | POA: Insufficient documentation

## 2022-05-23 DIAGNOSIS — Z79899 Other long term (current) drug therapy: Secondary | ICD-10-CM | POA: Insufficient documentation

## 2022-05-23 DIAGNOSIS — I714 Abdominal aortic aneurysm, without rupture, unspecified: Secondary | ICD-10-CM | POA: Insufficient documentation

## 2022-05-23 DIAGNOSIS — Z794 Long term (current) use of insulin: Secondary | ICD-10-CM | POA: Insufficient documentation

## 2022-05-23 DIAGNOSIS — I509 Heart failure, unspecified: Secondary | ICD-10-CM | POA: Insufficient documentation

## 2022-05-23 HISTORY — PX: ESOPHAGOGASTRODUODENOSCOPY (EGD) WITH PROPOFOL: SHX5813

## 2022-05-23 LAB — GLUCOSE, CAPILLARY: Glucose-Capillary: 149 mg/dL — ABNORMAL HIGH (ref 70–99)

## 2022-05-23 SURGERY — ESOPHAGOGASTRODUODENOSCOPY (EGD) WITH PROPOFOL
Anesthesia: General

## 2022-05-23 MED ORDER — PHENYLEPHRINE HCL (PRESSORS) 10 MG/ML IV SOLN
INTRAVENOUS | Status: DC | PRN
  Administered 2022-05-23: 80 ug via INTRAVENOUS

## 2022-05-23 MED ORDER — PROPOFOL 10 MG/ML IV BOLUS
INTRAVENOUS | Status: DC | PRN
  Administered 2022-05-23: 100 mg via INTRAVENOUS
  Administered 2022-05-23: 20 mg via INTRAVENOUS

## 2022-05-23 MED ORDER — STERILE WATER FOR IRRIGATION IR SOLN
Status: DC | PRN
  Administered 2022-05-23: .6 mL

## 2022-05-23 MED ORDER — LACTATED RINGERS IV SOLN: INTRAVENOUS | Status: DC

## 2022-05-23 MED ORDER — LIDOCAINE HCL (CARDIAC) PF 100 MG/5ML IV SOSY
PREFILLED_SYRINGE | INTRAVENOUS | Status: DC | PRN
  Administered 2022-05-23: 50 mg via INTRAVENOUS

## 2022-05-23 NOTE — Anesthesia Preprocedure Evaluation (Signed)
Anesthesia Evaluation  Patient identified by MRN, date of birth, ID band Patient awake    Reviewed: Allergy & Precautions, NPO status , Patient's Chart, lab work & pertinent test results, reviewed documented beta blocker date and time   History of Anesthesia Complications (+) PONV and history of anesthetic complications  Airway Mallampati: II  TM Distance: >3 FB Neck ROM: Full    Dental  (+) Missing, Dental Advisory Given   Pulmonary sleep apnea , former smoker Cough, hoarseness recently noted by patient   Pulmonary exam normal breath sounds clear to auscultation       Cardiovascular hypertension, Pt. on medications and Pt. on home beta blockers + Peripheral Vascular Disease (AAA) and +CHF  Normal cardiovascular exam Rhythm:Regular Rate:Normal  1. Left ventricular ejection fraction, by estimation, is 60 to 65%. The left ventricle has normal function. The left ventricle has no regional wall motion abnormalities. There is mild left ventricular hypertrophy.  Left ventricular diastolic parameters were normal. Elevated left ventricular end-diastolic pressure.  2. Right ventricular systolic function is normal. The right ventricular size is normal. There is normal pulmonary artery systolic pressure. The estimated right ventricular systolic pressure is 98.2 mmHg.  3. Left atrial size was mild to moderately dilated.  4. Right atrial size was moderately dilated.  5. The mitral valve is degenerative, mildly thickened with annular calcification. Mild mitral valve regurgitation.  6. The aortic valve is tricuspid. There is mild calcification of the aortic valve. left coronary cusp is significantly restricted. Aortic valve  regurgitation is trivial. Mild aortic valve stenosis. Aortic valve mean gradient measures 11.0 mmHg.  Dimenionless index 0.60.  7. Aortic dilatation noted. There is mild dilatation of the aortic root, measuring 41 mm.  8.  The inferior vena cava is dilated in size with >50% respiratory variability, suggesting right atrial pressure of 8 mmHg.    Neuro/Psych  PSYCHIATRIC DISORDERS Anxiety Depression     Neuromuscular disease (RLS)    GI/Hepatic ,GERD  Medicated,,(+) Cirrhosis   Esophageal Varices  substance abuse  alcohol use  Endo/Other  diabetes, Well Controlled, Type 2, Insulin Dependent, Oral Hypoglycemic Agents    Renal/GU Renal InsufficiencyRenal disease  negative genitourinary   Musculoskeletal  (+) Arthritis , Osteoarthritis,    Abdominal   Peds negative pediatric ROS (+)  Hematology  (+) Blood dyscrasia, anemia   Anesthesia Other Findings    Reproductive/Obstetrics negative OB ROS                             Anesthesia Physical Anesthesia Plan  ASA: 3  Anesthesia Plan: General   Post-op Pain Management: Minimal or no pain anticipated   Induction: Intravenous  PONV Risk Score and Plan: 2 and Propofol infusion and TIVA  Airway Management Planned: Nasal Cannula and Natural Airway  Additional Equipment:   Intra-op Plan:   Post-operative Plan:   Informed Consent: I have reviewed the patients History and Physical, chart, labs and discussed the procedure including the risks, benefits and alternatives for the proposed anesthesia with the patient or authorized representative who has indicated his/her understanding and acceptance.       Plan Discussed with: CRNA  Anesthesia Plan Comments:         Anesthesia Quick Evaluation

## 2022-05-23 NOTE — Op Note (Signed)
[facility] Patient Name: Austin Morgan Procedure Date: 05/23/2022 9:53 AM MRN: [id number] Date of Birth: 1955/11/21 Attending MD: Norvel Richards , MD, [id number] CSN: [id number] Age: 66 Admit Type: Outpatient Procedure:                Upper GI endoscopy Indications:              Esophageal varices Providers:                Norvel Richards, MD, Lurline Del, RN, Aram Candela Referring MD:              Medicines:                Propofol per Anesthesia Complications:            No immediate complications. Estimated Blood Loss:     Estimated blood loss was minimal. Procedure:                Pre-Anesthesia Assessment:                           - Prior to the procedure, a History and Physical                            was performed, and patient medications and                            allergies were reviewed. The patient's tolerance of                            previous anesthesia was also reviewed. The risks                            and benefits of the procedure and the sedation                            options and risks were discussed with the patient.                            All questions were answered, and informed consent                            was obtained. Prior Anticoagulants: The patient has                            taken no anticoagulant or antiplatelet agents. ASA                            Grade Assessment: III - A patient with severe                            systemic disease. After reviewing the risks and  benefits, the patient was deemed in satisfactory                            condition to undergo the procedure.                           After obtaining informed consent, the endoscope was                            passed under direct vision. Throughout the                            procedure, the patient's blood pressure, pulse, and                            oxygen saturations were  monitored continuously. The                            GIF-H190 ([id number]) scope was introduced through the                            mouth, and advanced to the second part of duodenum.                            The upper GI endoscopy was accomplished without                            difficulty. The patient tolerated the procedure                            well. Scope In: 10:23:58 AM Scope Out: 10:27:34 AM Total Procedure Duration: 0 hours 3 minutes 36 seconds  Findings:      Barely discernible discernible 2 short columns grade 1 esophageal       varices. Scattered squamous papillomas. Mucosa otherwise appeared normal       gastric cavity empty. Diffuse changes consistent with portal       gastropathy. Some mucosal touch friability but no gastric varices.       Patent pylorus. Examination the first second portion of duodenum       revealed no abnormalities      No banding indicated.. Impression:               - Grade 1 esophageal varices?"quiescent -no banding                            attempted.                           -Prominent portal gastropathy.                           -Patient appears at goal with his in NSBB. Moderate Sedation:      Moderate (conscious) sedation was personally administered by an       anesthesia professional. The following parameters were monitored: oxygen       saturation, heart rate, blood pressure, respiratory  rate, EKG, adequacy       of pulmonary ventilation, and response to care. Recommendation:           - Patient has a contact number available for                            emergencies. The signs and symptoms of potential                            delayed complications were discussed with the                            patient. Return to normal activities tomorrow.                            Written discharge instructions were provided to the                            patient.                           - Advance diet as tolerated.                            - Continue present medications.                           - Return to my office in 6 months. Repeat EGD in 9                            months. Procedure Code(s):        --- Professional ---                           [phone], Esophagogastroduodenoscopy, flexible,                            transoral; diagnostic, including collection of                            specimen(s) by brushing or washing, when performed                            (separate procedure) Diagnosis Code(s):        --- Professional ---                           I85.00, Esophageal varices without bleeding CPT copyright 2022 [employer]. All rights reserved. The codes documented in this report are preliminary and upon coder review may  be revised to meet current compliance requirements. Austin Morgan. Austin Mccrystal, MD Norvel Richards, MD 05/23/2022 10:38:48 AM This report has been signed electronically. Number of Addenda: 0

## 2022-05-23 NOTE — Discharge Instructions (Signed)
EGD Discharge instructions Please read the instructions outlined below and refer to this sheet in the next few weeks. These discharge instructions provide you with general information on caring for yourself after you leave the hospital. Your doctor may also give you specific instructions. While your treatment has been planned according to the most current medical practices available, unavoidable complications occasionally occur. If you have any problems or questions after discharge, please call your doctor. ACTIVITY You may resume your regular activity but move at a slower pace for the next 24 hours.  Take frequent rest periods for the next 24 hours.  Walking will help expel (get rid of) the air and reduce the bloated feeling in your abdomen.  No driving for 24 hours (because of the anesthesia (medicine) used during the test).  You may shower.  Do not sign any important legal documents or operate any machinery for 24 hours (because of the anesthesia used during the test).  NUTRITION Drink plenty of fluids.  You may resume your normal diet.  Begin with a light meal and progress to your normal diet.  Avoid alcoholic beverages for 24 hours or as instructed by your caregiver.  MEDICATIONS You may resume your normal medications unless your caregiver tells you otherwise.  WHAT YOU CAN EXPECT TODAY You may experience abdominal discomfort such as a feeling of fullness or "gas" pains.  FOLLOW-UP Your doctor will discuss the results of your test with you.  SEEK IMMEDIATE MEDICAL ATTENTION IF ANY OF THE FOLLOWING OCCUR: Excessive nausea (feeling sick to your stomach) and/or vomiting.  Severe abdominal pain and distention (swelling).  Trouble swallowing.  Temperature over 101 F (37.8 C).  Rectal bleeding or vomiting of blood.    minimal varices today.  No need for treatment.              continue nadolol daily         office visit in 6 months         Repeat EGD in 9 months          at  patient request, I called Story Vanvranken at [phone]  reviewed findings and recommendations

## 2022-05-23 NOTE — Transfer of Care (Signed)
Immediate Anesthesia Transfer of Care Note  Patient: Austin Morgan  Procedure(s) Performed: ESOPHAGOGASTRODUODENOSCOPY (EGD) WITH PROPOFOL  Patient Location: Short Stay  Anesthesia Type:General  Level of Consciousness: awake and alert   Airway & Oxygen Therapy: Patient Spontanous Breathing  Post-op Assessment: Report given to RN and Post -op Vital signs reviewed and stable  Post vital signs: Reviewed and stable  Last Vitals:  Vitals Value Taken Time  BP 88/55 05/23/22 1031  Temp 36.5 C 05/23/22 1031  Pulse 68 05/23/22 1031  Resp 18 05/23/22 1031  SpO2 96 % 05/23/22 1031    Last Pain:  Vitals:   05/23/22 1031  TempSrc: Axillary  PainSc: 0-No pain         Complications: No notable events documented.

## 2022-05-23 NOTE — H&P (Signed)
$'@LOGO'q$ @   Primary Care Physician:  Kathyrn Drown, MD Primary Gastroenterologist:  Dr. Gala Romney  Pre-Procedure History & Physical: HPI:  Austin Morgan is a 66 y.o. male here for  surveillance EGD.  History of known grade  2-3 esophageal varices banded earlier this year.  Follow-up demonstrated grade 1 with bleeding stigmata.  V  Varicestoo small to band.  He is on  nonselectivebeta-blockade therapy.   clinically, no bleeding recently.  He is here for surveillance EGD and possible banding as feasible/appropriate.  Past Medical History:  Diagnosis Date   Anxiety    Arthritis    Depression    Diabetes mellitus without complication ([place])    ED (erectile dysfunction)    Gout    H/O ETOH abuse    Hyperlipidemia    Hypertension    Hypertriglyceridemia    Iron deficiency anemia due to chronic blood loss 12/20/2021   PONV (postoperative nausea and vomiting)    Sleep apnea     Past Surgical History:  Procedure Laterality Date   CARPAL TUNNEL RELEASE Bilateral    COLONOSCOPY  2010   Dr. Delfin Edis: mild diverticulosis, next colonoscopy 2020   COLONOSCOPY WITH PROPOFOL N/A 01/21/2020   Chaniah Cisse: 9 polyps removed, multiple tubular adenomas.  Next colonoscopy in 3 years.   ELBOW SURGERY     ESOPHAGEAL BANDING  09/28/2021   Procedure: ESOPHAGEAL BANDING;  Surgeon: [name], MD;  [facility]: [facility];  Service: Endoscopy;;   ESOPHAGOGASTRODUODENOSCOPY (EGD) WITH PROPOFOL N/A 01/21/2020   Bijou Easler: normal   ESOPHAGOGASTRODUODENOSCOPY (EGD) WITH PROPOFOL N/A 09/28/2021   Procedure: ESOPHAGOGASTRODUODENOSCOPY (EGD) WITH PROPOFOL;  Surgeon: [name], MD;  Location: [facility];  Service: Endoscopy;  Laterality: N/A;  3:00pm   ESOPHAGOGASTRODUODENOSCOPY (EGD) WITH PROPOFOL N/A 10/31/2021   Procedure: ESOPHAGOGASTRODUODENOSCOPY (EGD) WITH PROPOFOL;  Surgeon: Harvel Quale, MD;  Location: [facility];  Service: Gastroenterology;  Laterality: N/A;   NECK SURGERY     c4    POLYPECTOMY  01/21/2020   Procedure: POLYPECTOMY;  Surgeon: [name], MD;  Location: [facility];  Service: Endoscopy;;  colon    Prior to Admission medications   Medication Sig Start Date End Date Taking? Authorizing Provider  albuterol (VENTOLIN HFA) 108 (90 Base) MCG/ACT inhaler Inhale 2 puffs into the lungs every 6 (six) hours as needed for wheezing. 10/24/21  Yes Kathyrn Drown, MD  cyanocobalamin (VITAMIN B12) 500 MCG tablet Take 500 mcg by mouth every Monday, Wednesday, and Friday.   Yes [provider]  diclofenac Sodium (VOLTAREN) 1 % GEL Apply 2 g topically in the morning, at noon, and at bedtime.   Yes [provider]  ferrous sulfate 325 (65 FE) MG EC tablet Take 325 mg by mouth every morning. 11/27/21  Yes [provider]  furosemide (LASIX) 40 MG tablet Take 1 tablet (40 mg total) by mouth 2 (two) times daily. 02/27/22 05/16/22 Yes Shahmehdi, Valeria Batman, MD  insulin aspart (NOVOLOG FLEXPEN) 100 UNIT/ML FlexPen Inject 5 units into the skin with meals twice a day 01/05/22  Yes Luking, Scott A, MD  insulin detemir (LEVEMIR FLEXPEN) 100 UNIT/ML FlexPen INJECT 50 UNITS SUBCUTANEOUSLY EVERY EVENING -  MAY  TITRATE  UP  TO  60  UNITS 05/08/22  Yes Luking, Scott A, MD  lactulose (CHRONULAC) 10 GM/15ML solution Take 30 mLs (20 g total) by mouth in the morning, at noon, in the evening, and at bedtime. 05/10/22  Yes Mahala Menghini, PA-C  magnesium oxide (MAG-OX) 400 (240 Mg) MG tablet Take 400 mg by mouth daily.   Yes [provider]  metolazone (ZAROXOLYN) 5 MG tablet Take 5 mg by mouth every Monday, Wednesday, and Friday. 02/14/22 05/16/22 Yes [provider]  Multiple Vitamins-Minerals (MULTIVITAMIN WITH MINERALS) tablet Take 1 tablet by mouth daily.   Yes [provider]  nadolol (CORGARD) 40 MG tablet Take 1/2 (one-half) tablet by mouth once daily Patient taking differently: Take 20 mg by mouth at bedtime. 04/17/22  Yes Luking, Scott A,  MD  ondansetron (ZOFRAN) 8 MG tablet TAKE 1 TABLET BY MOUTH THREE TIMES DAILY AS NEEDED FOR NAUSEA 12/15/21  Yes Luking, Elayne Snare, MD  pantoprazole (PROTONIX) 40 MG tablet Take 1 tablet (40 mg total) by mouth 2 (two) times daily. Patient taking differently: Take 40 mg by mouth daily as needed (acid reflux). 09/25/21  Yes Kathyrn Drown, MD  pramipexole (MIRAPEX) 0.25 MG tablet Take 1 tablet (0.25 mg total) by mouth at bedtime. 03/30/22  Yes Kathyrn Drown, MD  rifaximin (XIFAXAN) 550 MG TABS tablet Take 550 mg by mouth 2 (two) times daily.   Yes [provider]  spironolactone (ALDACTONE) 25 MG tablet Take 25 mg by mouth every Monday, Wednesday, and Friday. 11/27/21  Yes [provider]  traZODone (DESYREL) 100 MG tablet Take 1 tablet (100 mg total) by mouth at bedtime as needed. for sleep Patient taking differently: Take 100 mg by mouth at bedtime. 08/15/21  Yes Kathyrn Drown, MD  Continuous Blood Gluc Receiver (DEXCOM G6 RECEIVER) DEVI 1 Device by Does not apply route continuous. 01/05/22   Kathyrn Drown, MD  Continuous Blood Gluc Sensor (DEXCOM G6 SENSOR) MISC 1 Units by Does not apply route continuous. 01/05/22   Kathyrn Drown, MD  scopolamine (TRANSDERM-SCOP) 1 MG/3DAYS Place 1 patch (1.5 mg total) onto the skin every 3 (three) days. Patient not taking: Reported on 05/16/2022 03/02/22   Nilda Simmer, NP  tadalafil (CIALIS) 20 MG tablet Take 0.5-1 tablets (10-20 mg total) by mouth every other day as needed for erectile dysfunction. 01/05/22   Kathyrn Drown, MD    Allergies as of 03/27/2022 - Review Complete 03/27/2022  Allergen Reaction Noted   Codeine Nausea And Vomiting 11/26/2008    Family History  Problem Relation Age of Onset   Heart attack Father    Heart Problems Brother        Had open heart surgery   Colon cancer Neg Hx     Social History   Socioeconomic History   Marital status: Married    Spouse name: Not on file   Number of children: Not on file    Years of education: Not on file   Highest education level: Not on file  Occupational History   Not on file  Tobacco Use   Smoking status: Former    Packs/day: 1.00    Years: 36.00    Total pack years: 36.00    Types: Cigarettes    Quit date: 03/11/2016    Years since quitting: 6.2    Passive exposure: Past   Smokeless tobacco: Never  Vaping Use   Vaping Use: Never used  Substance and Sexual Activity   Alcohol use: Not Currently    Comment: drinks a fifth of liquor per week; inpatient rehab 08/2019. denied 10/14/19. No etoh since 03/2021 (as of 09/22/21)   Drug use: No   Sexual activity: Yes  Other Topics Concern   Not on  file  Social History Narrative   Wife, Dominica Severin NP [facility]   Social Determinants of Health   Financial Resource Strain: Low Risk  (03/23/2022)   Overall Financial Resource Strain (CARDIA)    Difficulty of Paying Living Expenses: Not hard at all  Food Insecurity: No Food Insecurity (03/23/2022)   Hunger Vital Sign    Worried About Running Out of Food in the Last Year: Never true    Ran Out of Food in the Last Year: Never true  Transportation Needs: No Transportation Needs (03/23/2022)   PRAPARE - Hydrologist (Medical): No    Lack of Transportation (Non-Medical): No  Physical Activity: Not on file  Stress: No Stress Concern Present (03/23/2022)   [place]    Feeling of Stress : Not at all  Social Connections: Not on file  Intimate Partner Violence: Not At Risk (03/23/2022)   Humiliation, Afraid, Rape, and Kick questionnaire    Fear of Current or Ex-Partner: No    Emotionally Abused: No    Physically Abused: No    Sexually Abused: No    Review of Systems: See HPI, otherwise negative ROS  Physical Exam: BP 111/73 (BP Location: Right Arm)   Temp 98.2 F (36.8 C)   Resp 18   SpO2 100%  General:   Alert,  Well-developed,  well-nourished, pleasant and cooperative in NAD SNeck:  Supple; no masses or thyromegaly. No significant cervical adenopathy. Lungs:  Clear throughout to auscultation.   No wheezes, crackles, or rhonchi. No acute distress. Heart:  Regular rate and rhythm; no murmurs, clicks, rubs,  or gallops. Abdomen: Non-distended, normal bowel sounds.  Soft and nontender without appreciable mass or hepatosplenomegaly.  Pulses:  Normal pulses noted. Extremities:  Without clubbing or edema.  Impression/Plan:    67 year old gentleman with EtOH related cirrhosis with known esophageal varices.  Question of variceal bleeding previously.  He is here for surveillance examination.  Procedure discussed risk benefits limitations alternatives.  Potential for esophageal band ligation  also discussed today.    Questions answered; all parties agreeable.     Notice: This dictation was prepared with Dragon dictation along with smaller phrase technology. Any transcriptional errors that result from this process are unintentional and may not be corrected upon review.

## 2022-05-23 NOTE — Anesthesia Postprocedure Evaluation (Signed)
Anesthesia Post Note  Patient: Austin Morgan  Procedure(s) Performed: ESOPHAGOGASTRODUODENOSCOPY (EGD) WITH PROPOFOL  Patient location during evaluation: Endoscopy Anesthesia Type: General Level of consciousness: awake and alert Pain management: pain level controlled Vital Signs Assessment: post-procedure vital signs reviewed and stable Respiratory status: spontaneous breathing, nonlabored ventilation, respiratory function stable and patient connected to nasal cannula oxygen Cardiovascular status: blood pressure returned to baseline and stable Postop Assessment: no apparent nausea or vomiting Anesthetic complications: no   There were no known notable events for this encounter.   Last Vitals:  Vitals:   05/23/22 0915 05/23/22 1031  BP: 111/73 (!) 88/55  Pulse:  68  Resp: 18 18  Temp: 36.8 C 36.5 C  SpO2: 100% 96%    Last Pain:  Vitals:   05/23/22 1031  TempSrc: Axillary  PainSc: 0-No pain                 Trixie Rude

## 2022-05-24 DIAGNOSIS — N189 Chronic kidney disease, unspecified: Secondary | ICD-10-CM | POA: Diagnosis not present

## 2022-05-24 DIAGNOSIS — K7460 Unspecified cirrhosis of liver: Secondary | ICD-10-CM | POA: Diagnosis not present

## 2022-05-24 DIAGNOSIS — I5032 Chronic diastolic (congestive) heart failure: Secondary | ICD-10-CM | POA: Diagnosis not present

## 2022-05-24 DIAGNOSIS — E1122 Type 2 diabetes mellitus with diabetic chronic kidney disease: Secondary | ICD-10-CM | POA: Diagnosis not present

## 2022-05-24 DIAGNOSIS — R809 Proteinuria, unspecified: Secondary | ICD-10-CM | POA: Diagnosis not present

## 2022-05-24 DIAGNOSIS — D6959 Other secondary thrombocytopenia: Secondary | ICD-10-CM | POA: Diagnosis not present

## 2022-05-24 DIAGNOSIS — D638 Anemia in other chronic diseases classified elsewhere: Secondary | ICD-10-CM | POA: Diagnosis not present

## 2022-05-24 DIAGNOSIS — I129 Hypertensive chronic kidney disease with stage 1 through stage 4 chronic kidney disease, or unspecified chronic kidney disease: Secondary | ICD-10-CM | POA: Diagnosis not present

## 2022-05-24 DIAGNOSIS — E1129 Type 2 diabetes mellitus with other diabetic kidney complication: Secondary | ICD-10-CM | POA: Diagnosis not present

## 2022-05-24 LAB — PTH, INTACT AND CALCIUM
Calcium, Total (PTH): 10.6 mg/dL — ABNORMAL HIGH (ref 8.6–10.2)
PTH: 12 pg/mL — ABNORMAL LOW (ref 15–65)

## 2022-05-28 ENCOUNTER — Telehealth: Payer: Self-pay

## 2022-05-28 NOTE — Telephone Encounter (Signed)
signed

## 2022-05-28 NOTE — Telephone Encounter (Signed)
Patient assistance forms for renewal of Xifaxan are in your box to be signed.

## 2022-05-28 NOTE — Telephone Encounter (Signed)
Forms have been faxed to [facility] for approval.

## 2022-05-30 ENCOUNTER — Encounter (HOSPITAL_COMMUNITY): Payer: Self-pay | Admitting: Internal Medicine

## 2022-06-06 ENCOUNTER — Other Ambulatory Visit: Payer: Self-pay | Admitting: Family Medicine

## 2022-06-08 ENCOUNTER — Encounter: Payer: Self-pay | Admitting: Family Medicine

## 2022-06-09 ENCOUNTER — Other Ambulatory Visit: Payer: Self-pay | Admitting: Nurse Practitioner

## 2022-06-09 DIAGNOSIS — E119 Type 2 diabetes mellitus without complications: Secondary | ICD-10-CM

## 2022-06-09 MED ORDER — NOVOLOG FLEXPEN 100 UNIT/ML ~~LOC~~ SOPN: PEN_INJECTOR | SUBCUTANEOUS | 2 refills | Status: DC

## 2022-06-12 ENCOUNTER — Encounter: Payer: Self-pay | Admitting: Family Medicine

## 2022-06-12 ENCOUNTER — Other Ambulatory Visit: Payer: Self-pay

## 2022-06-12 DIAGNOSIS — E119 Type 2 diabetes mellitus without complications: Secondary | ICD-10-CM

## 2022-06-12 NOTE — Progress Notes (Signed)
Patient is using 15 units per meal may titrate up to 20 units if necessary of the short acting insulin NovoLog may have 1 month with 6 refills

## 2022-06-13 MED ORDER — NOVOLOG FLEXPEN 100 UNIT/ML ~~LOC~~ SOPN: PEN_INJECTOR | SUBCUTANEOUS | 2 refills | Status: DC

## 2022-06-13 NOTE — Progress Notes (Signed)
Novolog script sent and my chart message sent to patient.

## 2022-06-13 NOTE — Telephone Encounter (Signed)
Patient called and stated that the Novolog dosage change. Patient is totally out of Novolog.

## 2022-06-15 DIAGNOSIS — E1165 Type 2 diabetes mellitus with hyperglycemia: Secondary | ICD-10-CM | POA: Diagnosis not present

## 2022-06-18 ENCOUNTER — Ambulatory Visit (INDEPENDENT_AMBULATORY_CARE_PROVIDER_SITE_OTHER): Payer: Medicare HMO | Admitting: Gastroenterology

## 2022-06-18 ENCOUNTER — Telehealth: Payer: Self-pay | Admitting: Gastroenterology

## 2022-06-18 ENCOUNTER — Encounter: Payer: Self-pay | Admitting: Gastroenterology

## 2022-06-18 VITALS — BP 100/58 | HR 62 | Temp 98.0°F | Ht 65.0 in | Wt 191.2 lb

## 2022-06-18 DIAGNOSIS — Z86010 Personal history of colonic polyps: Secondary | ICD-10-CM

## 2022-06-18 DIAGNOSIS — K7030 Alcoholic cirrhosis of liver without ascites: Secondary | ICD-10-CM

## 2022-06-18 DIAGNOSIS — K219 Gastro-esophageal reflux disease without esophagitis: Secondary | ICD-10-CM | POA: Diagnosis not present

## 2022-06-18 DIAGNOSIS — Z860101 Personal history of adenomatous and serrated colon polyps: Secondary | ICD-10-CM

## 2022-06-18 DIAGNOSIS — R69 Illness, unspecified: Secondary | ICD-10-CM | POA: Diagnosis not present

## 2022-06-18 NOTE — Patient Instructions (Signed)
We will be in touch regarding proceeding with colonoscopy.  Once you follow up with [facility], let me know when they plan to see you again so that we can better stagger appointments.  Continue current medications as before.

## 2022-06-18 NOTE — Progress Notes (Signed)
GI Office Note    Referring Provider: Kathyrn Drown, MD Primary Care Physician:  Austin Drown, MD  Primary Gastroenterologist: Garfield Cornea, MD   Chief Complaint   Chief Complaint  Patient presents with   Follow-up    Doing well, no issues.     History of Present Illness   Austin Morgan is a 66 y.o. male presenting today for follow-up.  Last seen in November.  He has a history of decompensated alcohol cirrhosis complicated by HE, variceal bleeding.  Quit alcohol in November 2022.  Followed by hematology for iron deficiency anemia.  Patient did not tolerate coming off PPI therapy completed (as requested by Dr. Zollie Scale as potential contributing factor to his IDA). He is doing well on PPI about twice per week.  Last EGD in December with grade 1 esophageal varices, no banding attempted.  He had prominent portal gastropathy.  Plans for next EGD in 9 months.  Has been on nadolol 20 mg daily.  Has been seen by Dr. Zollie Scale at [facility] October 2023.  MELD sodium 14, too low to initiate transplant evaluation.  AFP less than 1.8. Due for follow up 07/2022.  Right upper quadrant ultrasound completed May 14, 2022: Ultrasound stable, no evidence of hepatoma.  Weight is up 7 pounds since 04/2022. He believes it is due to increased calorie consumption. He went on cruise to [place]. He states he has a lot of lower extremity swelling in setting of increase sodium intake in food prepared on cruise. His edema is better now after returning home and resuming his 2 gram sodium diet. He denies abdominal pain. BMs regular on lactulose. No melena, brbpr. No episodes of confusion. Heartburn is not regular. Takes PPI only as needed, typically twice per week.     Patient is due for surveillance colonoscopy this year due to having 9 polyps (tubular adenomas) removed in 01/2020.    EGD 05/2022: Grade 1 esophageal varices?quiescent -no banding attempted. -Prominent portal gastropathy. -Patient appears  at goal with his in NSBB. -next EGD in 9 months   Medications   Current Outpatient Medications  Medication Sig Dispense Refill   albuterol (VENTOLIN HFA) 108 (90 Base) MCG/ACT inhaler INHALE 2 PUFFS BY MOUTH EVERY 6 HOURS AS NEEDED FOR WHEEZING 9 g 0   Continuous Blood Gluc Receiver (DEXCOM G6 RECEIVER) DEVI 1 Device by Does not apply route continuous. 1 each 1   Continuous Blood Gluc Sensor (DEXCOM G6 SENSOR) MISC 1 Units by Does not apply route continuous. 3 each 1   cyanocobalamin (VITAMIN B12) 500 MCG tablet Take 500 mcg by mouth every Monday, Wednesday, and Friday.     diclofenac Sodium (VOLTAREN) 1 % GEL Apply 2 g topically in the morning, at noon, and at bedtime.     ferrous sulfate 325 (65 FE) MG EC tablet Take 325 mg by mouth every morning.     furosemide (LASIX) 40 MG tablet Take 1 tablet (40 mg total) by mouth 2 (two) times daily. 30 tablet 1   insulin aspart (NOVOLOG FLEXPEN) 100 UNIT/ML FlexPen Inject 15 units into the skin with meals twice daily may titrate up to 20 units 15 mL 2   insulin detemir (LEVEMIR FLEXPEN) 100 UNIT/ML FlexPen INJECT 50 UNITS SUBCUTANEOUSLY EVERY EVENING -  MAY  TITRATE  UP  TO  60  UNITS 15 mL 1   lactulose (CHRONULAC) 10 GM/15ML solution Take 30 mLs (20 g total) by mouth in the morning, at noon, in  the evening, and at bedtime. 3600 mL 5   magnesium oxide (MAG-OX) 400 (240 Mg) MG tablet Take 400 mg by mouth daily.     metolazone (ZAROXOLYN) 5 MG tablet Take 5 mg by mouth every Monday, Wednesday, and Friday.     Multiple Vitamins-Minerals (MULTIVITAMIN WITH MINERALS) tablet Take 1 tablet by mouth daily.     nadolol (CORGARD) 40 MG tablet Take 1/2 (one-half) tablet by mouth once daily (Patient taking differently: Take 20 mg by mouth at bedtime.) 45 tablet 5   ondansetron (ZOFRAN) 8 MG tablet TAKE 1 TABLET BY MOUTH THREE TIMES DAILY AS NEEDED FOR NAUSEA 20 tablet 3   pantoprazole (PROTONIX) 40 MG tablet Take 1 tablet (40 mg total) by mouth 2 (two) times  daily. (Patient taking differently: Take 40 mg by mouth daily as needed (acid reflux).) 60 tablet 3   pramipexole (MIRAPEX) 0.25 MG tablet Take 1 tablet (0.25 mg total) by mouth at bedtime. 90 tablet 1   rifaximin (XIFAXAN) 550 MG TABS tablet Take 550 mg by mouth 2 (two) times daily.     spironolactone (ALDACTONE) 25 MG tablet Take 25 mg by mouth every Monday, Wednesday, and Friday.     tadalafil (CIALIS) 20 MG tablet Take 0.5-1 tablets (10-20 mg total) by mouth every other day as needed for erectile dysfunction. 6 tablet 11   traZODone (DESYREL) 100 MG tablet Take 1 tablet (100 mg total) by mouth at bedtime as needed. for sleep (Patient taking differently: Take 100 mg by mouth at bedtime.) 90 tablet 3   No current facility-administered medications for this visit.    Allergies   Allergies as of 06/18/2022 - Review Complete 06/18/2022  Allergen Reaction Noted   Codeine Nausea And Vomiting 11/26/2008   Past Medical History:  Diagnosis Date   Anxiety    Arthritis    Depression    Diabetes mellitus without complication ([place])    ED (erectile dysfunction)    Gout    H/O ETOH abuse    Hyperlipidemia    Hypertension    Hypertriglyceridemia    Iron deficiency anemia due to chronic blood loss 12/20/2021   PONV (postoperative nausea and vomiting)    Sleep apnea    Past Surgical History:  Procedure Laterality Date   CARPAL TUNNEL RELEASE Bilateral    COLONOSCOPY  2010   Dr. Delfin Edis: mild diverticulosis, next colonoscopy 2020   COLONOSCOPY WITH PROPOFOL N/A 01/21/2020   Rourk: 9 polyps removed, multiple tubular adenomas.  Next colonoscopy in 3 years.   ELBOW SURGERY     ESOPHAGEAL BANDING  09/28/2021   Procedure: ESOPHAGEAL BANDING;  Surgeon: [name], MD;  [facility]: [facility];  Service: Endoscopy;;   ESOPHAGOGASTRODUODENOSCOPY (EGD) WITH PROPOFOL N/A 01/21/2020   Rourk: normal   ESOPHAGOGASTRODUODENOSCOPY (EGD) WITH PROPOFOL N/A 09/28/2021   Procedure:  ESOPHAGOGASTRODUODENOSCOPY (EGD) WITH PROPOFOL;  Surgeon: [name], MD;  [facility]: [facility];  Service: Endoscopy;  Laterality: N/A;  3:00pm   ESOPHAGOGASTRODUODENOSCOPY (EGD) WITH PROPOFOL N/A 10/31/2021   Procedure: ESOPHAGOGASTRODUODENOSCOPY (EGD) WITH PROPOFOL;  Surgeon: Harvel Quale, MD;  Location: [facility];  Service: Gastroenterology;  Laterality: N/A;   ESOPHAGOGASTRODUODENOSCOPY (EGD) WITH PROPOFOL N/A 05/23/2022   Procedure: ESOPHAGOGASTRODUODENOSCOPY (EGD) WITH PROPOFOL;  Surgeon: [name], MD;  Location: [facility];  Service: Endoscopy;  Laterality: N/A;  8:30am, asa 3   NECK SURGERY     c4   POLYPECTOMY  01/21/2020   Procedure: POLYPECTOMY;  Surgeon: Manus Rudd  M, MD;  Location: [facility];  Service: Endoscopy;;  colon   Family History  Problem Relation Age of Onset   Heart attack Father    Heart Problems Brother        Had open heart surgery   Colon cancer Neg Hx    Social History   Tobacco Use   Smoking status: Former    Packs/day: 1.00    Years: 36.00    Total pack years: 36.00    Types: Cigarettes    Quit date: 03/11/2016    Years since quitting: 6.2    Passive exposure: Past   Smokeless tobacco: Never  Vaping Use   Vaping Use: Never used  Substance Use Topics   Alcohol use: Not Currently    Comment: drinks a fifth of liquor per week; inpatient rehab 08/2019. denied 10/14/19. No etoh since 03/2021 (as of 09/22/21)   Drug use: No    Review of Systems   General: Negative for anorexia, weight loss, fever, chills, fatigue, weakness. ENT: Negative for hoarseness, difficulty swallowing , nasal congestion. CV: Negative for chest pain, angina, palpitations, dyspnea on exertion, peripheral edema.  Respiratory: Negative for dyspnea at rest, dyspnea on exertion, cough, sputum, wheezing.  GI: See history of present illness. GU:  Negative for dysuria, hematuria, urinary incontinence, urinary frequency, nocturnal urination.  Endo:  Negative for unusual weight change.     Physical Exam   BP (!) 100/58 (BP Location: Right Arm, Patient Position: Sitting, Cuff Size: Large)   Pulse 62   Temp 98 F (36.7 C) (Oral)   Ht '5\' 5"'$  (1.651 m)   Wt 191 lb 3.2 oz (86.7 kg)   SpO2 98%   BMI 31.82 kg/m    General: chronically ill appearing male in no acute distress.  Eyes: No icterus. Mouth: Oropharyngeal mucosa moist and pink  Abdomen: Bowel sounds are normal, nontender, nondistended, no hepatosplenomegaly or masses,  no abdominal bruits or hernia , no rebound or guarding.  Rectal: not performed Extremities: No lower extremity edema. No clubbing or deformities. Neuro: Alert and oriented x 4   Skin: Warm and dry, no jaundice.   Psych: Alert and cooperative, normal mood and affect.  Labs   Lab Results  Component Value Date   FERRITIN 184 05/22/2022   Lab Results  Component Value Date   IRON 98 05/22/2022   TIBC 331 05/22/2022   FERRITIN 184 05/22/2022   Lab Results  Component Value Date   CHYIFOYD74 128 12/20/2021   Lab Results  Component Value Date   FOLATE >40.0 12/20/2021   Lab Results  Component Value Date   WBC 8.3 05/22/2022   HGB 11.1 (L) 05/22/2022   HCT 31.8 (L) 05/22/2022   MCV 92.2 05/22/2022   PLT 104 (L) 05/22/2022   Lab Results  Component Value Date   ALT 28 04/20/2022   AST 34 04/20/2022          ALKPHOS 138 (H) 04/20/2022   BILITOT 1.1 04/20/2022   Lab Results  Component Value Date   INR 1.3 (H) 02/27/2022   INR 1.3 (H) 02/26/2022   INR 1.3 (H) 02/24/2022   Lab Results  Component Value Date   CREATININE 2.33 (H) 05/22/2022   BUN 60 (H) 05/22/2022   NA 138 05/22/2022   K 4.5 05/22/2022   CL 102 05/22/2022   CO2 27 05/22/2022    Imaging Studies   No results found.  Assessment   Cirrhosis: due to prior etoh use. Quit etoh  in 04/2021. Complicated by esophageal variceal bleeding in the past and recurrent hepatic encephalopathy. He has had issues with lower extremity  edema, diuretics managed by nephrology. He has been seen by [facility]. MELD too low to initiate transplant evaluation. He is currently up to date on labs and hepatoma screening. Due for follow up at [place] next month. We will try to stagger our appointments with hepatology. EGD up to date with plans for next EGD in 9 months.   GERD: patient doing well on prn PPI, typically taken twice per week. He was asked to try and limit due to potential contributing factor to his IDA (requested by Dr. Zollie Scale). Continue to monitor at this time.    H/O adenomatous colon polyps: due this year for numerous tubular adenomas removed in 2021. Patient wants to proceed at this time.    PLAN   We will follow up after he has been seen at [facility] and try to stagger appointments.  Colonoscopy in the near future. ASA 3.  I have discussed the risks, alternatives, benefits with regards to but not limited to the risk of reaction to medication, bleeding, infection, perforation and the patient is agreeable to proceed. Written consent to be obtained.    Laureen Ochs. Bobby Rumpf, [place], [facility]

## 2022-06-18 NOTE — Telephone Encounter (Signed)
Patient requested pursuing his surveillance colonoscopy with Dr. Gala Romney in the next 1-2 months as opposed to waiting to later in the year. Due to have 9 polyps removed, Dr. Gala Romney said we can proceed.   Colonoscopy with Dr. Gala Romney ASA 3. Use trilyte and tap water enemas due to renal insufficiency. Hold iron 7 days Day of prep: novolog 7 units BID, Levemir 25 units at night.  AM of TCS: hold novolog and levemir.  PATIENT SAID HIS INSULIN MAY CHANGE IF IT DOES HE SHOULD CALL FOR NEW INSTRUCTIONS

## 2022-06-19 NOTE — Telephone Encounter (Signed)
Called pt and advised he would need to contact his insurance to determine what they will pay if he has done now. Provided CPT code and dx code. He will call and let us know what he decides to do. If they do not cover now he will wait until 01/2023

## 2022-06-20 ENCOUNTER — Telehealth: Payer: Self-pay | Admitting: Family Medicine

## 2022-06-20 ENCOUNTER — Telehealth (INDEPENDENT_AMBULATORY_CARE_PROVIDER_SITE_OTHER): Payer: Medicare HMO | Admitting: Family Medicine

## 2022-06-20 DIAGNOSIS — E1169 Type 2 diabetes mellitus with other specified complication: Secondary | ICD-10-CM

## 2022-06-20 DIAGNOSIS — E119 Type 2 diabetes mellitus without complications: Secondary | ICD-10-CM

## 2022-06-20 DIAGNOSIS — M25512 Pain in left shoulder: Secondary | ICD-10-CM | POA: Diagnosis not present

## 2022-06-20 DIAGNOSIS — E785 Hyperlipidemia, unspecified: Secondary | ICD-10-CM

## 2022-06-20 NOTE — Progress Notes (Signed)
   Subjective:    Patient ID: Austin Morgan, male    DOB: [birth year], 67 y.o.   MRN: [id number]  HPI Patient needing video visit for referral to ortho. Pt having left shoulder pain/discomfort. Also having limited range of motion. Reports this has been going on for couple of months. Pt requesting Dr.Harrison. Significant shoulder pain discomfort worse over the past several weeks increased pain decreased range of motion  Also has diabetes sugars have been running somewhat high I told him to increase his long-acting insulin by 2 units and give [place] an update Virtual Visit via Video Note  I connected with Austin Morgan on 06/20/22 at 11:20 AM EST by a video enabled telemedicine application and verified that I am speaking with the correct person using two identifiers.  Location: Patient: home Provider: office   I discussed the limitations of evaluation and management by telemedicine and the availability of in person appointments. The patient expressed understanding and agreed to proceed.  History of Present Illness:    Observations/Objective:   Assessment and Plan:   Follow Up Instructions:    I discussed the assessment and treatment plan with the patient. The patient was provided an opportunity to ask questions and all were answered. The patient agreed with the plan and demonstrated an understanding of the instructions.   The patient was advised to call back or seek an in-person evaluation if the symptoms worsen or if the condition fails to improve as anticipated.  I provided 15 minutes of non-face-to-face time during this encounter.       Review of Systems     Objective:   Physical Exam  Patient had virtual visit-video Appears to be in no distress Atraumatic Neuro able to relate and oriented No apparent resp distress Color normal Limited range of motion of the left shoulder related into pain and discomfort      Assessment & Plan:  Diabetes will need to do follow-up  lab work in February and a follow-up office visit  Shoulder pain discomfort referral to Dr. Aline Brochure for further evaluation more likely need x-rays and potential injection of steroids and physical therapy

## 2022-06-20 NOTE — Telephone Encounter (Signed)
Mr. hershey, knauer are scheduled for a virtual visit with your provider today.    Just as we do with appointments in the office, we must obtain your consent to participate.  Your consent will be active for this visit and any virtual visit you may have with one of our providers in the next 365 days.    If you have a MyChart account, I can also send a copy of this consent to you electronically.  All virtual visits are billed to your insurance company just like a traditional visit in the office.  As this is a virtual visit, video technology does not allow for your provider to perform a traditional examination.  This may limit your provider's ability to fully assess your condition.  If your provider identifies any concerns that need to be evaluated in person or the need to arrange testing such as labs, EKG, etc, we will make arrangements to do so.    Although advances in technology are sophisticated, we cannot ensure that it will always work on either your end or our end.  If the connection with a video visit is poor, we may have to switch to a telephone visit.  With either a video or telephone visit, we are not always able to ensure that we have a secure connection.   I need to obtain your verbal consent now.   Are you willing to proceed with your visit today?   Kinser Fellman has provided verbal consent on 06/20/2022 for a virtual visit (video or telephone).   Vicente Males, LPN 9/98/3382  50:53 AM

## 2022-06-20 NOTE — Progress Notes (Signed)
Lab orders placed.   Front please schedule pt for Feb for Diabetes check up. Thank you.

## 2022-06-20 NOTE — Addendum Note (Signed)
Addended by: Vicente Males on: 06/20/2022 02:13 PM   Modules accepted: Orders

## 2022-06-21 ENCOUNTER — Encounter: Payer: Self-pay | Admitting: Hematology

## 2022-06-29 ENCOUNTER — Other Ambulatory Visit: Payer: Self-pay | Admitting: Nurse Practitioner

## 2022-06-29 ENCOUNTER — Telehealth: Payer: Self-pay | Admitting: *Deleted

## 2022-06-29 ENCOUNTER — Encounter: Payer: Self-pay | Admitting: Nurse Practitioner

## 2022-06-29 MED ORDER — LANTUS SOLOSTAR 100 UNIT/ML ~~LOC~~ SOPN: PEN_INJECTOR | SUBCUTANEOUS | 2 refills | Status: DC

## 2022-06-29 NOTE — Telephone Encounter (Signed)
Lantus ordered.

## 2022-06-29 NOTE — Telephone Encounter (Signed)
Patietn stated that his insuace no longer covers Levemir and he will be out of his insulin this weekend and needs a comprable subsitute sent to [facility] in [place] today- Patient states he takes 50-60 units of the Levemir

## 2022-07-02 ENCOUNTER — Ambulatory Visit (INDEPENDENT_AMBULATORY_CARE_PROVIDER_SITE_OTHER): Payer: Medicare HMO | Admitting: Orthopedic Surgery

## 2022-07-02 ENCOUNTER — Encounter: Payer: Self-pay | Admitting: Orthopedic Surgery

## 2022-07-02 ENCOUNTER — Ambulatory Visit (INDEPENDENT_AMBULATORY_CARE_PROVIDER_SITE_OTHER): Payer: Medicare HMO

## 2022-07-02 VITALS — BP 133/74 | HR 57 | Ht 66.0 in | Wt 188.0 lb

## 2022-07-02 DIAGNOSIS — G8929 Other chronic pain: Secondary | ICD-10-CM

## 2022-07-02 DIAGNOSIS — M25512 Pain in left shoulder: Secondary | ICD-10-CM | POA: Diagnosis not present

## 2022-07-02 MED ORDER — METHYLPREDNISOLONE ACETATE 40 MG/ML IJ SUSP
40.0000 mg | Freq: Once | INTRAMUSCULAR | Status: AC
  Administered 2022-07-02: 40 mg via INTRA_ARTICULAR

## 2022-07-02 NOTE — Progress Notes (Signed)
Advised patient to call dexcom for replacement device after xrays today he voiced understanding

## 2022-07-02 NOTE — Patient Instructions (Addendum)
Physical therapy has been ordered for you at [facility] They should call you to schedule, [phone]  is the phone number to call, if you want to call to schedule.   You have received an injection of steroids into the joint. 15% of patients will have increased pain within the 24 hours postinjection.   This is transient and will go away.   We recommend that you use ice packs on the injection site for 20 minutes every 2 hours and extra strength Tylenol 2 tablets every 8 as needed until the pain resolves.  If you continue to have pain after taking the Tylenol and using the ice please call the office for further instructions.

## 2022-07-02 NOTE — Progress Notes (Signed)
Chief Complaint  Patient presents with   Shoulder Pain    Left for a couple  months      Patient ID: Austin Morgan, male   DOB: 08-May-1956, 67 y.o.   MRN: [id number]  SUMMARY AND PLAN:  67 year old male with liver disease diabetes no history of trauma appears to be developing adhesive capsulitis with loss of internal rotation.  The rotator cuff is intact.  Recommend subacromial injection and physical therapy  Procedure note the subacromial injection shoulder left   Verbal consent was obtained to inject the  Left   Shoulder  Timeout was completed to confirm the injection site is a subacromial space of the  left  shoulder  Medication used Depo-Medrol 40 mg and lidocaine 1% 3 cc  Anesthesia was provided by ethyl chloride  The injection was performed in the left  posterior subacromial space. After pinning the skin with alcohol and anesthetized the skin with ethyl chloride the subacromial space was injected using a 20-gauge needle. There were no complications  Sterile dressing was applied.            Chief Complaint  Patient presents with   Shoulder Pain    Left for a couple  months       HPI Austin Morgan is a 67 y.o. male.  He presents for evaluation of left shoulder.  A couple of months ago he had to go to the hospital via EMS.  He was placed in a chair to get him out of the house.  He stayed in the hospital for 36 hours in a coma.  When he woke up his left shoulder was hurting.  He has taken some ibuprofen he has not had any physical therapy or injection  He notes extension and inability to get his hand all the way to his back pocket.  Some pain anteriorly.   Current Outpatient Medications  Medication Instructions   albuterol (VENTOLIN HFA) 108 (90 Base) MCG/ACT inhaler INHALE 2 PUFFS BY MOUTH EVERY 6 HOURS AS NEEDED FOR WHEEZING   Continuous Blood Gluc Receiver (DEXCOM G6 RECEIVER) DEVI 1 Device, Does not apply, Continuous   Continuous Blood Gluc Sensor (DEXCOM G6  SENSOR) MISC 1 Units, Does not apply, Continuous   cyanocobalamin (VITAMIN B12) 500 mcg, Oral, Every M-W-F   diclofenac Sodium (VOLTAREN) 2 g, Topical, 3 times daily   ferrous sulfate 325 mg, Oral, Every morning   furosemide (LASIX) 40 mg, Oral, 2 times daily   insulin aspart (NOVOLOG FLEXPEN) 100 UNIT/ML FlexPen Inject 15 units into the skin with meals twice daily may titrate up to 20 units   insulin glargine (LANTUS SOLOSTAR) 100 UNIT/ML Solostar Pen Inject 50-60 units subcutaneously every evening   lactulose (CHRONULAC) 20 g, Oral, 4 times daily   magnesium oxide (MAG-OX) 400 mg, Oral, Daily   metolazone (ZAROXOLYN) 5 mg, Oral, Every M-W-F   Multiple Vitamins-Minerals (MULTIVITAMIN WITH MINERALS) tablet 1 tablet, Oral, Daily   nadolol (CORGARD) 40 MG tablet Take 1/2 (one-half) tablet by mouth once daily   ondansetron (ZOFRAN) 8 MG tablet TAKE 1 TABLET BY MOUTH THREE TIMES DAILY AS NEEDED FOR NAUSEA   pantoprazole (PROTONIX) 40 mg, Oral, 2 times daily   pramipexole (MIRAPEX) 0.25 mg, Oral, Daily at bedtime   rifaximin (XIFAXAN) 550 mg, Oral, 2 times daily   spironolactone (ALDACTONE) 25 mg, Oral, Every M-W-F   tadalafil (CIALIS) 10-20 mg, Oral, Every 48 hours PRN   traZODone (DESYREL) 100 mg, Oral, At bedtime PRN, for sleep  Allergies  Allergen Reactions   Codeine Nausea And Vomiting    Review of Systems Review of Systems  Constitutional:  Negative for fever.  Respiratory:  Negative for shortness of breath.   Cardiovascular:  Negative for chest pain.  Neurological:  Negative for tingling.    Past Medical History:  Diagnosis Date   Anxiety    Arthritis    Depression    Diabetes mellitus without complication ([place])    ED (erectile dysfunction)    Gout    H/O ETOH abuse    Hyperlipidemia    Hypertension    Hypertriglyceridemia    Iron deficiency anemia due to chronic blood loss 12/20/2021   PONV (postoperative nausea and vomiting)    Sleep apnea     Past Surgical  History:  Procedure Laterality Date   CARPAL TUNNEL RELEASE Bilateral    COLONOSCOPY  2010   Dr. Delfin Edis: mild diverticulosis, next colonoscopy 2020   COLONOSCOPY WITH PROPOFOL N/A 01/21/2020   Rourk: 9 polyps removed, multiple tubular adenomas.  Next colonoscopy in 3 years.   ELBOW SURGERY     ESOPHAGEAL BANDING  09/28/2021   Procedure: ESOPHAGEAL BANDING;  Surgeon: [name], MD;  [facility];  Service: Endoscopy;;   ESOPHAGOGASTRODUODENOSCOPY (EGD) WITH PROPOFOL N/A 01/21/2020   Rourk: normal   ESOPHAGOGASTRODUODENOSCOPY (EGD) WITH PROPOFOL N/A 09/28/2021   Procedure: ESOPHAGOGASTRODUODENOSCOPY (EGD) WITH PROPOFOL;  Surgeon: [name], MD;  [facility];  Service: Endoscopy;  Laterality: N/A;  3:00pm   ESOPHAGOGASTRODUODENOSCOPY (EGD) WITH PROPOFOL N/A 10/31/2021   Procedure: ESOPHAGOGASTRODUODENOSCOPY (EGD) WITH PROPOFOL;  Surgeon: Harvel Quale, MD;  [facility];  Service: Gastroenterology;  Laterality: N/A;   ESOPHAGOGASTRODUODENOSCOPY (EGD) WITH PROPOFOL N/A 05/23/2022   Procedure: ESOPHAGOGASTRODUODENOSCOPY (EGD) WITH PROPOFOL;  Surgeon: [name], MD;  [facility];  Service: Endoscopy;  Laterality: N/A;  8:30am, asa 3   NECK SURGERY     c4   POLYPECTOMY  01/21/2020   Procedure: POLYPECTOMY;  Surgeon: [name], MD;  [facility]: [facility];  Service: Endoscopy;;  colon    Family History  Problem Relation Age of Onset   Heart attack Father    Heart Problems Brother        Had open heart surgery   Colon cancer Neg Hx      Social History   Tobacco Use   Smoking status: Former    Packs/day: 1.00    Years: 36.00    Total pack years: 36.00    Types: Cigarettes    Quit date: 03/11/2016    Years since quitting: 6.3    Passive exposure: Past   Smokeless tobacco: Never  Vaping Use   Vaping Use: Never used  Substance Use Topics   Alcohol use: Not Currently    Comment: drinks a fifth of  liquor per week; inpatient rehab 08/2019. denied 10/14/19. No etoh since 03/2021 (as of 09/22/21)   Drug use: No    Allergies  Allergen Reactions   Codeine Nausea And Vomiting    Current Meds  Medication Sig   albuterol (VENTOLIN HFA) 108 (90 Base) MCG/ACT inhaler INHALE 2 PUFFS BY MOUTH EVERY 6 HOURS AS NEEDED FOR WHEEZING   Continuous Blood Gluc Receiver (DEXCOM G6 RECEIVER) DEVI 1 Device by Does not apply route continuous.   Continuous Blood Gluc Sensor (DEXCOM G6 SENSOR) MISC 1 Units by Does not apply route continuous.   cyanocobalamin (VITAMIN B12) 500 MCG tablet Take 500  mcg by mouth every Monday, Wednesday, and Friday.   diclofenac Sodium (VOLTAREN) 1 % GEL Apply 2 g topically in the morning, at noon, and at bedtime.   ferrous sulfate 325 (65 FE) MG EC tablet Take 325 mg by mouth every morning.   insulin aspart (NOVOLOG FLEXPEN) 100 UNIT/ML FlexPen Inject 15 units into the skin with meals twice daily may titrate up to 20 units   insulin glargine (LANTUS SOLOSTAR) 100 UNIT/ML Solostar Pen Inject 50-60 units subcutaneously every evening   lactulose (CHRONULAC) 10 GM/15ML solution Take 30 mLs (20 g total) by mouth in the morning, at noon, in the evening, and at bedtime.   magnesium oxide (MAG-OX) 400 (240 Mg) MG tablet Take 400 mg by mouth daily.   Multiple Vitamins-Minerals (MULTIVITAMIN WITH MINERALS) tablet Take 1 tablet by mouth daily.   nadolol (CORGARD) 40 MG tablet Take 1/2 (one-half) tablet by mouth once daily (Patient taking differently: Take 20 mg by mouth at bedtime.)   ondansetron (ZOFRAN) 8 MG tablet TAKE 1 TABLET BY MOUTH THREE TIMES DAILY AS NEEDED FOR NAUSEA   pantoprazole (PROTONIX) 40 MG tablet Take 1 tablet (40 mg total) by mouth 2 (two) times daily. (Patient taking differently: Take 40 mg by mouth daily as needed (acid reflux).)   pramipexole (MIRAPEX) 0.25 MG tablet Take 1 tablet (0.25 mg total) by mouth at bedtime.   rifaximin (XIFAXAN) 550 MG TABS tablet Take 550 mg  by mouth 2 (two) times daily.   spironolactone (ALDACTONE) 25 MG tablet Take 25 mg by mouth every Monday, Wednesday, and Friday.   tadalafil (CIALIS) 20 MG tablet Take 0.5-1 tablets (10-20 mg total) by mouth every other day as needed for erectile dysfunction.   traZODone (DESYREL) 100 MG tablet Take 1 tablet (100 mg total) by mouth at bedtime as needed. for sleep (Patient taking differently: Take 100 mg by mouth at bedtime.)       Physical Exam BP 133/74   Pulse (!) 57   Ht '5\' 6"'$  (1.676 m)   Wt 188 lb (85.3 kg)   BMI 30.34 kg/m   Ambulatory status normal with no assistive devices  GENERAL : APPEARANCE IS NORMAL GROOMING IS GOOD  NORMAL MOOD AND AFFECT  AWAKE ALERT AND ORIENTED X 3   Left SHOULDER  TENDERNESS bicipital groove anterolateral acromion anterior deltoid, no posterior tenderness ROM decreased internal rotation decreased external rotation compared to the right side with the arm at his side and in abduction external rotation STABLE ANTERIORLY POSTERIORLY AND INFERIORLY SKIN CLEAN  Rotator cuff strength was normal as well   PROVOCATIVE TESTS  DROP ARM TEST nrml PAINFUL ARC is between 90 degrees and 120 degrees EMPTY CAN -JOBST TEST is normal EXTERNAL ROTATION LAG TEST also normal LIFT OFF TEST no abnormalities BELLYPRESS TEST excellent  ON THE OTHER SIDE THE RIGHT SHOULDER HAS NORMAL SKIN, NO ROM DEFICITS, EXCELLENT STABILITY, AND NORMAL 5/5 MMT STRENGTH   MEDICAL DECISION MAKING  A.  Encounter Diagnosis  Name Primary?   Chronic left shoulder pain Yes    B. DATA ANALYSED:  As follows  IMAGING: Independent interpretation of images: Internal imaging shows normal shoulder joint  Orders: Physical therapy  Outside records reviewed: None  C. MANAGEMENT nonsurgical  PT  Follow-up 8 weeks  Meds ordered this encounter  Medications   methylPREDNISolone acetate (DEPO-MEDROL) injection 40 mg

## 2022-07-05 ENCOUNTER — Telehealth: Payer: Self-pay

## 2022-07-05 NOTE — Telephone Encounter (Signed)
PA for Xifaxan Tablet was approved from 06/11/2022 through 01/01/2023. Approval letter to be scanned into patient's chart.

## 2022-07-13 DIAGNOSIS — M25612 Stiffness of left shoulder, not elsewhere classified: Secondary | ICD-10-CM | POA: Diagnosis not present

## 2022-07-13 DIAGNOSIS — M62512 Muscle wasting and atrophy, not elsewhere classified, left shoulder: Secondary | ICD-10-CM | POA: Diagnosis not present

## 2022-07-13 DIAGNOSIS — M25512 Pain in left shoulder: Secondary | ICD-10-CM | POA: Diagnosis not present

## 2022-07-13 DIAGNOSIS — R293 Abnormal posture: Secondary | ICD-10-CM | POA: Diagnosis not present

## 2022-07-15 DIAGNOSIS — E1165 Type 2 diabetes mellitus with hyperglycemia: Secondary | ICD-10-CM | POA: Diagnosis not present

## 2022-07-18 ENCOUNTER — Other Ambulatory Visit: Payer: Self-pay | Admitting: Family Medicine

## 2022-07-18 DIAGNOSIS — G479 Sleep disorder, unspecified: Secondary | ICD-10-CM

## 2022-07-20 ENCOUNTER — Inpatient Hospital Stay: Payer: Medicare HMO | Attending: Hematology

## 2022-07-20 ENCOUNTER — Telehealth: Payer: Self-pay

## 2022-07-20 ENCOUNTER — Other Ambulatory Visit: Payer: Self-pay | Admitting: Nurse Practitioner

## 2022-07-20 DIAGNOSIS — G479 Sleep disorder, unspecified: Secondary | ICD-10-CM

## 2022-07-20 DIAGNOSIS — D509 Iron deficiency anemia, unspecified: Secondary | ICD-10-CM | POA: Diagnosis not present

## 2022-07-20 DIAGNOSIS — E1122 Type 2 diabetes mellitus with diabetic chronic kidney disease: Secondary | ICD-10-CM | POA: Diagnosis not present

## 2022-07-20 DIAGNOSIS — D500 Iron deficiency anemia secondary to blood loss (chronic): Secondary | ICD-10-CM

## 2022-07-20 DIAGNOSIS — D696 Thrombocytopenia, unspecified: Secondary | ICD-10-CM

## 2022-07-20 DIAGNOSIS — D631 Anemia in chronic kidney disease: Secondary | ICD-10-CM | POA: Diagnosis not present

## 2022-07-20 DIAGNOSIS — K7030 Alcoholic cirrhosis of liver without ascites: Secondary | ICD-10-CM | POA: Diagnosis not present

## 2022-07-20 DIAGNOSIS — I130 Hypertensive heart and chronic kidney disease with heart failure and stage 1 through stage 4 chronic kidney disease, or unspecified chronic kidney disease: Secondary | ICD-10-CM | POA: Diagnosis not present

## 2022-07-20 DIAGNOSIS — D890 Polyclonal hypergammaglobulinemia: Secondary | ICD-10-CM | POA: Insufficient documentation

## 2022-07-20 DIAGNOSIS — G2581 Restless legs syndrome: Secondary | ICD-10-CM | POA: Insufficient documentation

## 2022-07-20 DIAGNOSIS — Z87891 Personal history of nicotine dependence: Secondary | ICD-10-CM | POA: Insufficient documentation

## 2022-07-20 DIAGNOSIS — I5032 Chronic diastolic (congestive) heart failure: Secondary | ICD-10-CM | POA: Diagnosis not present

## 2022-07-20 DIAGNOSIS — R69 Illness, unspecified: Secondary | ICD-10-CM | POA: Diagnosis not present

## 2022-07-20 DIAGNOSIS — N1832 Chronic kidney disease, stage 3b: Secondary | ICD-10-CM | POA: Diagnosis not present

## 2022-07-20 DIAGNOSIS — F5089 Other specified eating disorder: Secondary | ICD-10-CM | POA: Diagnosis not present

## 2022-07-20 LAB — IRON AND TIBC
Iron: 71 ug/dL (ref 45–182)
Saturation Ratios: 23 % (ref 17.9–39.5)
TIBC: 311 ug/dL (ref 250–450)
UIBC: 240 ug/dL

## 2022-07-20 LAB — COMPREHENSIVE METABOLIC PANEL
ALT: 48 U/L — ABNORMAL HIGH (ref 0–44)
AST: 38 U/L (ref 15–41)
Albumin: 4.1 g/dL (ref 3.5–5.0)
Alkaline Phosphatase: 167 U/L — ABNORMAL HIGH (ref 38–126)
Anion gap: 13 (ref 5–15)
BUN: 52 mg/dL — ABNORMAL HIGH (ref 8–23)
CO2: 27 mmol/L (ref 22–32)
Calcium: 9.5 mg/dL (ref 8.9–10.3)
Chloride: 96 mmol/L — ABNORMAL LOW (ref 98–111)
Creatinine, Ser: 2.26 mg/dL — ABNORMAL HIGH (ref 0.61–1.24)
GFR, Estimated: 31 mL/min — ABNORMAL LOW (ref >60)
Glucose, Bld: 404 mg/dL — ABNORMAL HIGH (ref 70–99)
Potassium: 3.8 mmol/L (ref 3.5–5.1)
Sodium: 136 mmol/L (ref 135–145)
Total Bilirubin: 0.9 mg/dL (ref 0.3–1.2)
Total Protein: 8.2 g/dL — ABNORMAL HIGH (ref 6.5–8.1)

## 2022-07-20 LAB — LIPID PANEL
Cholesterol: 149 mg/dL (ref 0–200)
HDL: 44 mg/dL (ref >40)
LDL Cholesterol: 77 mg/dL (ref 0–99)
Total CHOL/HDL Ratio: 3.4 RATIO
Triglycerides: 138 mg/dL (ref <150)
VLDL: 28 mg/dL (ref 0–40)

## 2022-07-20 LAB — CBC WITH DIFFERENTIAL/PLATELET
Abs Immature Granulocytes: 0.04 10*3/uL (ref 0.00–0.07)
Basophils Absolute: 0 10*3/uL (ref 0.0–0.1)
Basophils Relative: 1 %
Eosinophils Absolute: 0.3 10*3/uL (ref 0.0–0.5)
Eosinophils Relative: 4 %
HCT: 35.1 % — ABNORMAL LOW (ref 39.0–52.0)
Hemoglobin: 12.4 g/dL — ABNORMAL LOW (ref 13.0–17.0)
Immature Granulocytes: 1 %
Lymphocytes Relative: 18 %
Lymphs Abs: 1.4 10*3/uL (ref 0.7–4.0)
MCH: 32.9 pg (ref 26.0–34.0)
MCHC: 35.3 g/dL (ref 30.0–36.0)
MCV: 93.1 fL (ref 80.0–100.0)
Monocytes Absolute: 0.5 10*3/uL (ref 0.1–1.0)
Monocytes Relative: 7 %
Neutro Abs: 5.3 10*3/uL (ref 1.7–7.7)
Neutrophils Relative %: 69 %
Platelets: 113 10*3/uL — ABNORMAL LOW (ref 150–400)
RBC: 3.77 MIL/uL — ABNORMAL LOW (ref 4.22–5.81)
RDW: 13 % (ref 11.5–15.5)
WBC: 7.6 10*3/uL (ref 4.0–10.5)
nRBC: 0 % (ref 0.0–0.2)

## 2022-07-20 LAB — HEMOGLOBIN A1C
Hgb A1c MFr Bld: 8.6 % — ABNORMAL HIGH (ref 4.8–5.6)
Mean Plasma Glucose: 200.12 mg/dL

## 2022-07-20 LAB — FERRITIN: Ferritin: 116 ng/mL (ref 24–336)

## 2022-07-20 LAB — VITAMIN B12: Vitamin B-12: 1067 pg/mL — ABNORMAL HIGH (ref 180–914)

## 2022-07-20 MED ORDER — TRAZODONE HCL 100 MG PO TABS: 100.0000 mg | ORAL_TABLET | Freq: Every evening | ORAL | 0 refills | Status: DC | PRN

## 2022-07-20 NOTE — Telephone Encounter (Signed)
Pt went today for blood work lab drew extra vile of blood for A1C if Dr Nicki Reaper wanted to order its been done so he can add to blood work.  Please advise if you need to add an A1c .

## 2022-07-20 NOTE — Telephone Encounter (Signed)
Pt went today for blood work lab drew extra vile of blood for A1C if Dr Nicki Reaper wanted to order its been done so he can add to blood work.   Herbie [place] [phone]

## 2022-07-20 NOTE — Telephone Encounter (Signed)
Nurses Back in January we ordered an A1c and a lipid.  Please see if the cancer center can at these there is already a active order thank you

## 2022-07-20 NOTE — Telephone Encounter (Signed)
Called lab at the hospital and added A1c and lipid , they stated they would check and see if they could add on the labs.

## 2022-07-23 ENCOUNTER — Ambulatory Visit (INDEPENDENT_AMBULATORY_CARE_PROVIDER_SITE_OTHER): Payer: Medicare HMO | Admitting: Family Medicine

## 2022-07-23 VITALS — BP 106/66 | Wt 189.8 lb

## 2022-07-23 DIAGNOSIS — E119 Type 2 diabetes mellitus without complications: Secondary | ICD-10-CM | POA: Diagnosis not present

## 2022-07-23 DIAGNOSIS — D696 Thrombocytopenia, unspecified: Secondary | ICD-10-CM | POA: Diagnosis not present

## 2022-07-23 DIAGNOSIS — Z87891 Personal history of nicotine dependence: Secondary | ICD-10-CM

## 2022-07-23 DIAGNOSIS — D631 Anemia in chronic kidney disease: Secondary | ICD-10-CM | POA: Diagnosis not present

## 2022-07-23 DIAGNOSIS — G479 Sleep disorder, unspecified: Secondary | ICD-10-CM | POA: Diagnosis not present

## 2022-07-23 DIAGNOSIS — D61818 Other pancytopenia: Secondary | ICD-10-CM | POA: Diagnosis not present

## 2022-07-23 DIAGNOSIS — I5032 Chronic diastolic (congestive) heart failure: Secondary | ICD-10-CM | POA: Diagnosis not present

## 2022-07-23 DIAGNOSIS — E1169 Type 2 diabetes mellitus with other specified complication: Secondary | ICD-10-CM | POA: Diagnosis not present

## 2022-07-23 DIAGNOSIS — I8510 Secondary esophageal varices without bleeding: Secondary | ICD-10-CM

## 2022-07-23 DIAGNOSIS — N1832 Chronic kidney disease, stage 3b: Secondary | ICD-10-CM | POA: Diagnosis not present

## 2022-07-23 DIAGNOSIS — K7031 Alcoholic cirrhosis of liver with ascites: Secondary | ICD-10-CM

## 2022-07-23 DIAGNOSIS — E785 Hyperlipidemia, unspecified: Secondary | ICD-10-CM

## 2022-07-23 DIAGNOSIS — F1021 Alcohol dependence, in remission: Secondary | ICD-10-CM

## 2022-07-23 DIAGNOSIS — R69 Illness, unspecified: Secondary | ICD-10-CM | POA: Diagnosis not present

## 2022-07-23 MED ORDER — NOVOLOG FLEXPEN 100 UNIT/ML ~~LOC~~ SOPN: PEN_INJECTOR | SUBCUTANEOUS | 6 refills | Status: DC

## 2022-07-23 MED ORDER — TRAZODONE HCL 100 MG PO TABS: ORAL_TABLET | ORAL | 1 refills | Status: DC

## 2022-07-23 MED ORDER — LANTUS SOLOSTAR 100 UNIT/ML ~~LOC~~ SOPN: PEN_INJECTOR | SUBCUTANEOUS | 6 refills | Status: DC

## 2022-07-23 MED ORDER — TRIAMCINOLONE ACETONIDE 0.1 % EX CREA: TOPICAL_CREAM | CUTANEOUS | 4 refills | Status: AC

## 2022-07-23 NOTE — Progress Notes (Signed)
07/23/22-referral placed for lung cancer screening

## 2022-07-23 NOTE — Addendum Note (Signed)
Addended by: Orvan Seen on: 07/23/2022 05:01 PM   Modules accepted: Orders

## 2022-07-23 NOTE — Addendum Note (Signed)
Addended by: Vicente Males on: 07/23/2022 03:20 PM   Modules accepted: Orders

## 2022-07-23 NOTE — Patient Instructions (Signed)
Hi Timonthy  As for your diabetes we will go ahead and make some changes  Ideally we would like to see your morning sugars in the range of 100 to 130.  Then throughout the day we would like to see the sugars stay under 180 if possible.  As for the long-acting insulin-I would recommend going up from 60 to 66 units each evening.  In approximately 1 week let me know how that is doing.  If it is not doing well enough we will increase the dose again.  Essentially we will increase the dose from 2 to 6 units every week until we get to our goal  As for the short acting insulin I would recommend 20 units in the morning, 10 units at lunch, and 20 units at supper  We will connect with [facility] later today to see if we can write the prescription in such a way that your monthly cost will be less.  We will also have them dispense fine needle tips that are shorter  Also RSV vaccine would be available through the pharmacy .  Additional lab work in mid to late May with follow-up office visit by late May early June would be wise  Please send [place] updates on a regular basis regarding your glucoses.  Thanks-Dr. Nicki Reaper Wolfgang [place]

## 2022-07-23 NOTE — Progress Notes (Signed)
   Subjective:    Patient ID: Austin Morgan, male    DOB: 1956-06-01, 67 y.o.   MRN: [id number]  Diabetes He presents for his follow-up diabetic visit. He has type 2 diabetes mellitus. Hypoglycemia symptoms include sweats. There are no diabetic associated symptoms.  Chronic diastolic (congestive) heart failure (HCC)  Sleep disturbance - Plan: traZODone (DESYREL) 100 MG tablet  Thrombocytopenia (HCC)  Secondary esophageal varices without bleeding (HCC), Chronic  Recovering alcoholic in remission ([place]), Chronic  Anemia in stage 3b chronic kidney disease ([place]), Chronic  Other pancytopenia ([place]), Chronic  Hyperlipidemia associated with type 2 diabetes mellitus ([place]), Chronic  Currently his cirrhosis is doing better Has not had any confusion In addition to this energy level doing better Taking his medications Glucoses tend to run elevated recently he is doing the best he can with his insulin but still has high readings     Review of Systems     Objective:   Physical Exam  General-in no acute distress Eyes-no discharge Lungs-respiratory rate normal, CTA CV-no murmurs,RRR Extremities skin warm dry no edema Neuro grossly normal Behavior normal, alert Mild abdominal swelling noted but no severe ascites currently Ankles normal     Assessment & Plan:  1. Sleep disturbance Trazodone at nighttime. - traZODone (DESYREL) 100 MG tablet; One and one half qhs  Dispense: 135 tablet; Refill: 1  2. Chronic diastolic (congestive) heart failure (HCC) Stable currently.  Denies any shortness of breath continue medication watch salt intake  3. Thrombocytopenia ([place]) Last reading was not critical monitor periodically.  No bleeding issues  4. Secondary esophageal varices without bleeding (HCC) No bleeding issues currently being followed by gastroenterology  5. Recovering alcoholic in remission [facility]) Patient has not had any alcohol in 18 months which is excellent  6. Anemia in stage  3b chronic kidney disease (HCC) Hemoglobin is depressed due to his chronic kidney disease.  He is followed by nephrology  7. Other pancytopenia ([place]) This is secondary to his cirrhosis as well as chronic kidney disease stable currently  8. Hyperlipidemia associated with type 2 diabetes mellitus ([place]) Not on statin due to cirrhosis  Cirrhosis-stable currently followed by gastroenterology

## 2022-07-25 DIAGNOSIS — R293 Abnormal posture: Secondary | ICD-10-CM | POA: Diagnosis not present

## 2022-07-25 DIAGNOSIS — M62512 Muscle wasting and atrophy, not elsewhere classified, left shoulder: Secondary | ICD-10-CM | POA: Diagnosis not present

## 2022-07-25 DIAGNOSIS — M25612 Stiffness of left shoulder, not elsewhere classified: Secondary | ICD-10-CM | POA: Diagnosis not present

## 2022-07-25 DIAGNOSIS — M25512 Pain in left shoulder: Secondary | ICD-10-CM | POA: Diagnosis not present

## 2022-07-25 LAB — METHYLMALONIC ACID, SERUM: Methylmalonic Acid, Quantitative: 390 nmol/L — ABNORMAL HIGH (ref 0–378)

## 2022-07-27 ENCOUNTER — Inpatient Hospital Stay (HOSPITAL_BASED_OUTPATIENT_CLINIC_OR_DEPARTMENT_OTHER): Payer: Medicare HMO | Admitting: Physician Assistant

## 2022-07-27 VITALS — BP 113/75 | HR 53 | Temp 97.5°F | Resp 16 | Wt 188.5 lb

## 2022-07-27 DIAGNOSIS — D500 Iron deficiency anemia secondary to blood loss (chronic): Secondary | ICD-10-CM | POA: Diagnosis not present

## 2022-07-27 DIAGNOSIS — N1832 Chronic kidney disease, stage 3b: Secondary | ICD-10-CM

## 2022-07-27 DIAGNOSIS — D631 Anemia in chronic kidney disease: Secondary | ICD-10-CM

## 2022-07-27 DIAGNOSIS — E1122 Type 2 diabetes mellitus with diabetic chronic kidney disease: Secondary | ICD-10-CM | POA: Diagnosis not present

## 2022-07-27 DIAGNOSIS — R69 Illness, unspecified: Secondary | ICD-10-CM | POA: Diagnosis not present

## 2022-07-27 DIAGNOSIS — I5032 Chronic diastolic (congestive) heart failure: Secondary | ICD-10-CM | POA: Diagnosis not present

## 2022-07-27 DIAGNOSIS — D509 Iron deficiency anemia, unspecified: Secondary | ICD-10-CM | POA: Diagnosis not present

## 2022-07-27 DIAGNOSIS — D696 Thrombocytopenia, unspecified: Secondary | ICD-10-CM | POA: Diagnosis not present

## 2022-07-27 DIAGNOSIS — I130 Hypertensive heart and chronic kidney disease with heart failure and stage 1 through stage 4 chronic kidney disease, or unspecified chronic kidney disease: Secondary | ICD-10-CM | POA: Diagnosis not present

## 2022-07-27 DIAGNOSIS — D890 Polyclonal hypergammaglobulinemia: Secondary | ICD-10-CM | POA: Diagnosis not present

## 2022-07-27 DIAGNOSIS — Z87891 Personal history of nicotine dependence: Secondary | ICD-10-CM | POA: Diagnosis not present

## 2022-07-27 DIAGNOSIS — G2581 Restless legs syndrome: Secondary | ICD-10-CM | POA: Diagnosis not present

## 2022-07-27 NOTE — Progress Notes (Signed)
[place], [place] [id number]   CLINIC:  Medical Oncology/Hematology  PCP:  Kathyrn Drown, MD [address] [place] [address] [phone]   REASON FOR VISIT:  Follow-up for normocytic anemia (iron deficiency + CKD), thrombocytopenia, and leukopenia   CURRENT THERAPY: Intermittent iron infusions  INTERVAL HISTORY:   Austin Morgan 67 y.o. male returns for routine follow-up of his iron deficiency anemia, anemia of CKD, thrombocytopenia, and leukopenia.  He was last seen by Tarri Abernethy PA-C on 04/20/2022.   At today's visit, he reports feeling fair.  He has not had any hospitalizations, surgeries, or changes in his baseline health status.   He reports that his energy is improving.  He continues to report ice pica (decreased) and restless legs.  He denies any headaches, chest pain, dyspnea on exertion, palpitations, lightheadedness, or syncope.   Patient denies any hematemesis, melena, or hematochezia since his last visit.    He has occasional nosebleeds, but none recently.   He admits to easy bruising but denies petechial rash. No frequent infections or B symptoms such as fever, chills, night sweats, unintentional weight loss. He denies any new bone pain or neurologic changes. He is taking daily iron supplement/vitamin C, takes it at separate times in his Protonix.    He is taking vitamin B12 three times weekly.  He has 75% energy and 100% appetite. He endorses that he is maintaining a stable weight.  ASSESSMENT & PLAN:  1.  Normocytic anemia secondary to iron deficiency and CKD stage IIIb/IV -  Normocytic anemia since 2021, with significant worsening of his anemia in April 2023 (Hgb 5.6) during admission to hospital for GI bleeding.   - Hematology work-up (12/20/2021): Reticulocytes 1.5% (hypoproliferative in the setting of moderate anemia) LDH normal. Ferritin 31, iron saturation 21%. Normal folate, copper. Normal B12 781  with mildly elevated methylmalonic acid 411. SPEP negative.  Immunofixation shows polyclonal gammopathy.   - Hospitalized in April 2023 for GI bleeding secondary to varices, requiring esophageal variceal banding and PRBC transfusion x 3.  He was hospitalized again in May 2023 with volume overload and symptomatic anemia requiring repeat EGD (10/31/2021) which showed portal gastropathy and grade 1 varices - he received Venofer 300 mg during hospitalization in May. - Follows with [facility] Neil Crouch, PA-C and Dr. Gala Romney) for his alcohol-related liver cirrhosis and GI bleeding. - He has been taking oral iron supplementation since June 2023 - He is taking vitamin B12 500 mcg 3 times weekly  - Most recent IV Venofer 300 mg x 3 from 02/27/2022 through 03/13/2022 - Receiving Procrit via Dr. Theador Hawthorne, but has not required injection since 02/22/2022 due to Hgb >10 - Denies any recent hematemesis, melena, or hematochezia. - Symptomatic with fatigue, pica, restless legs - Most recent labs (07/20/2022): Hgb 12.4/MCV 93.1, ferritin 116, iron saturation 23 %.  Baseline CKD stage IIIb/IV with creatinine 2.26/GFR 31.  Vitamin B12 1067, MMA is elevated but improved at 390. - Differential diagnosis favors iron deficiency anemia secondary to GI blood loss as well as anemia secondary to his CKD stage IIIb - PLAN: No indication for IV iron at this time - Patient can continue to take oral iron supplementation, recommended to take this at least 4 hours separate from his Protonix  - Continue taking vitamin B12 500 mcg 3 times weekly (due to elevated MMA as above)  - Procrit as per nephrologist - Labs in 4 months with office visit  1 week after labs   2.  Thrombocytopenia and leukopenia (cirrhosis and splenomegaly) - Intermittent mild leukopenia since April 2023.  He has had mild thrombocytopenia since June 2022, with most recent platelets (12/13/2021) at 17.   - Nephrology testing for hepatitis B,  hepatitis C, and HIV in June 2023 was negative - Hematology work-up (12/20/2021): Reticulocytes 1.5% (hypoproliferative in the setting of moderate anemia) LDH normal. Ferritin 31, iron saturation 21%. Normal folate, copper. Normal B12 781 with mildly elevated methylmalonic acid 411. SPEP negative for M spike.  Immunofixation shows polyclonal gammopathy.  - No history of autoimmune or connective tissue disorder. - Patient has liver cirrhosis and splenomegaly - Follows with [facility] Neil Crouch, PA-C and Dr. Gala Romney) for his alcohol-related liver cirrhosis and GI bleeding. - Most recent abdominal ultrasound (12/07/2020) showed splenomegaly measuring 14 cm with volume 678 cc. - Admits to easy bruising, no petechial rash.  Occasional mild epistaxis. - Most recent labs (07/20/2022): Platelets 113.  Normal WBC and differential.  Vitamin B12 1067, MMA is elevated but improved at 390. - PLAN: Continue taking vitamin B12 500 mcg 3 times weekly (due to elevated MMA as above)  - No strong indication for treatment of leukopenia or thrombocytopenia at this time.  We will continue to monitor.   3.  Elevated free light chains - Labs obtained by nephrologist (11/15/2021) show elevated free serum light chains with elevated kappa 168.3, elevated lambda 92.6, and elevated ratio 1.82. - 24-hour urine (11/15/2021) showed elevated urine light chains with kappa 96.75 and lambda 9.02 with ratio 10.73.  Urine immunofixation showed polyclonal light chains but was negative for monoclonal immunoglobulin or Bence-Jones protein. - Hematology work-up (12/20/2021):  SPEP negative for M spike.  Immunofixation shows polyclonal gammopathy.  Elevated kappa light chains 138.0, elevated lambda 78.5, with elevated ratio 1.76 (in keeping with CKD) - He denies any new bone pain or B symptoms.   - PLAN: Elevated light chains secondary to CKD.  No further work-up of plasma cell dyscrasias at this time.   4.  Other  history - PMH: CKD stage 3b, type 2 diabetes mellitus, anemia of chronic disease, hypertension, alcoholic liver cirrhosis, history of GI bleed from esophageal varices, restless legs, sleep apnea, and chronic diastolic CHF. 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 - SUBSTANCE: History of heavy alcohol use for 20 years, but has been sober since October 2022.  He smoked 0.5 PPD cigarettes for 30 years, but quit in 2017.  He has history of drug use many years ago, but denies any IV drug use. - FAMILY: No family history of blood abnormalities or cancer.   PLAN SUMMARY: >> Labs in 4 months = CMC/D, CMP, ferritin, iron/TIBC, B12, MMA >> OFFICE visit in 4 months (1 week after labs)     REVIEW OF SYSTEMS:   Review of Systems  Constitutional:  Positive for fatigue (improved). Negative for appetite change, chills, diaphoresis, fever and unexpected weight change.  HENT:   Negative for lump/mass and nosebleeds.   Eyes:  Negative for eye problems.  Respiratory:  Positive for cough. Negative for hemoptysis and shortness of breath.   Cardiovascular:  Negative for chest pain, leg swelling and palpitations.  Gastrointestinal:  Positive for constipation. Negative for abdominal pain, blood in stool, diarrhea, nausea and vomiting.  Genitourinary:  Negative for hematuria.   Musculoskeletal:  Positive for arthralgias (left shoulder).  Skin: Negative.   Neurological:  Negative for dizziness, headaches and light-headedness.  Hematological:  Does not bruise/bleed easily.     PHYSICAL EXAM:  ECOG PERFORMANCE STATUS: 1 - Symptomatic but completely ambulatory  Vitals:   07/27/22 1104  BP: 113/75  Pulse: (!) 53  Resp: 16  Temp: (!) 97.5 F (36.4 C)  SpO2: 100%   Filed Weights   07/27/22 1104  Weight: 188 lb 7.9 oz (85.5 kg)   Physical Exam Constitutional:      Appearance: Normal appearance. He is obese.  HENT:      Head: Normocephalic and atraumatic.     Mouth/Throat:     Mouth: Mucous membranes are moist.  Eyes:     Extraocular Movements: Extraocular movements intact.     Pupils: Pupils are equal, round, and reactive to light.  Cardiovascular:     Rate and Rhythm: Normal rate and regular rhythm.     Pulses: Normal pulses.     Heart sounds: Normal heart sounds.  Pulmonary:     Effort: Pulmonary effort is normal.     Breath sounds: Normal breath sounds.  Abdominal:     General: Bowel sounds are normal.     Palpations: Abdomen is soft.     Tenderness: There is no abdominal tenderness.  Musculoskeletal:        General: No swelling.     Right lower leg: Edema (1+) present.     Left lower leg: Edema (1+) present.  Lymphadenopathy:     Cervical: No cervical adenopathy.  Skin:    General: Skin is warm and dry.  Neurological:     General: No focal deficit present.     Mental Status: He is alert and oriented to person, place, and time.  Psychiatric:        Mood and Affect: Mood normal.        Behavior: Behavior normal.     PAST MEDICAL/SURGICAL HISTORY:  Past Medical History:  Diagnosis Date   Anxiety    Arthritis    Depression    Diabetes mellitus without complication ([place])    ED (erectile dysfunction)    Gout    H/O ETOH abuse    Hyperlipidemia    Hypertension    Hypertriglyceridemia    Iron deficiency anemia due to chronic blood loss 12/20/2021   PONV (postoperative nausea and vomiting)    Sleep apnea    Past Surgical History:  Procedure Laterality Date   CARPAL TUNNEL RELEASE Bilateral    COLONOSCOPY  2010   Dr. Delfin Edis: mild diverticulosis, next colonoscopy 2020   COLONOSCOPY WITH PROPOFOL N/A 01/21/2020   Rourk: 9 polyps removed, multiple tubular adenomas.  Next colonoscopy in 3 years.   ELBOW SURGERY     ESOPHAGEAL BANDING  09/28/2021   Procedure: ESOPHAGEAL BANDING;  Surgeon: [name], MD;  [facility];  Service: Endoscopy;;    ESOPHAGOGASTRODUODENOSCOPY (EGD) WITH PROPOFOL N/A 01/21/2020   Rourk: normal   ESOPHAGOGASTRODUODENOSCOPY (EGD) WITH PROPOFOL N/A 09/28/2021   Procedure: ESOPHAGOGASTRODUODENOSCOPY (EGD) WITH PROPOFOL;  Surgeon: [name], MD;  [facility]: [facility];  Service: Endoscopy;  Laterality: N/A;  3:00pm   ESOPHAGOGASTRODUODENOSCOPY (EGD) WITH PROPOFOL N/A 10/31/2021   Procedure: ESOPHAGOGASTRODUODENOSCOPY (EGD) WITH PROPOFOL;  Surgeon: Harvel Quale, MD;  [facility]: [facility];  Service: Gastroenterology;  Laterality: N/A;   ESOPHAGOGASTRODUODENOSCOPY (EGD) WITH PROPOFOL N/A 05/23/2022   Procedure: ESOPHAGOGASTRODUODENOSCOPY (EGD) WITH PROPOFOL;  Surgeon: [name], MD;  [facility];  Service: Endoscopy;  Laterality: N/A;  8:30am, asa 3   NECK SURGERY     c4   POLYPECTOMY  01/21/2020   Procedure: POLYPECTOMY;  Surgeon: [name], MD;  [facility]: [facility];  Service: Endoscopy;;  colon    SOCIAL HISTORY:  Social History   Socioeconomic History   Marital status: Married    Spouse name: Not on file   Number of children: Not on file   Years of education: Not on file   Highest education level: Not on file  Occupational History   Not on file  Tobacco Use   Smoking status: Former    Packs/day: 1.00    Years: 36.00    Total pack years: 36.00    Types: Cigarettes    Quit date: 03/11/2016    Years since quitting: 6.3    Passive exposure: Past   Smokeless tobacco: Never  Vaping Use   Vaping Use: Never used  Substance and Sexual Activity   Alcohol use: Not Currently    Comment: drinks a fifth of liquor per week; inpatient rehab 08/2019. denied 10/14/19. No etoh since 03/2021 (as of 09/22/21)   Drug use: No   Sexual activity: Yes  Other Topics Concern   Not on file  Social History Narrative   Wife, Zacarius Boor NP [facility] [place]   Social Determinants of Health   Financial Resource Strain: Low Risk  (03/23/2022)   Overall  Financial Resource Strain (CARDIA)    Difficulty of Paying Living Expenses: Not hard at all  Food Insecurity: No Food Insecurity (03/23/2022)   Hunger Vital Sign    Worried About Running Out of Food in the Last Year: Never true    Ran Out of Food in the Last Year: Never true  Transportation Needs: No Transportation Needs (03/23/2022)   PRAPARE - Hydrologist (Medical): No    Lack of Transportation (Non-Medical): No  Physical Activity: Not on file  Stress: No Stress Concern Present (03/23/2022)   [place]    Feeling of Stress : Not at all  Social Connections: Not on file  Intimate Partner Violence: Not At Risk (03/23/2022)   Humiliation, Afraid, Rape, and Kick questionnaire    Fear of Current or Ex-Partner: No    Emotionally Abused: No    Physically Abused: No    Sexually Abused: No    FAMILY HISTORY:  Family History  Problem Relation Age of Onset   Heart attack Father    Heart Problems Brother        Had open heart surgery   Colon cancer Neg Hx     CURRENT MEDICATIONS:  Outpatient Encounter Medications as of 07/27/2022  Medication Sig   albuterol (VENTOLIN HFA) 108 (90 Base) MCG/ACT inhaler INHALE 2 PUFFS BY MOUTH EVERY 6 HOURS AS NEEDED FOR WHEEZING   BD PEN NEEDLE NANO 2ND GEN 32G X 4 MM MISC USE TO ADMINISTER INSULIN   Continuous Blood Gluc Receiver (DEXCOM G6 RECEIVER) DEVI 1 Device by Does not apply route continuous.   Continuous Blood Gluc Sensor (DEXCOM G6 SENSOR) MISC 1 Units by Does not apply route continuous. (Patient taking differently: 1 Units by Does not apply route continuous. G7)   cyanocobalamin (VITAMIN B12) 500 MCG tablet Take 500 mcg by mouth every Monday, Wednesday, and Friday.   diclofenac Sodium (VOLTAREN) 1 % GEL Apply 2 g topically in the morning, at noon, and at bedtime.   ferrous sulfate 325 (65  FE) MG EC tablet Take 325 mg by mouth every morning.    insulin aspart (NOVOLOG FLEXPEN) 100 UNIT/ML FlexPen Inject 20 units in the AM, Inject 10 units at lunch, and inject 20 units at supper   insulin glargine (LANTUS SOLOSTAR) 100 UNIT/ML Solostar Pen Inject 66 units subcutaneously every evening may titrate up to 80 units   lactulose (CHRONULAC) 10 GM/15ML solution Take 30 mLs (20 g total) by mouth in the morning, at noon, in the evening, and at bedtime.   magnesium oxide (MAG-OX) 400 (240 Mg) MG tablet Take 400 mg by mouth daily.   Multiple Vitamins-Minerals (MULTIVITAMIN WITH MINERALS) tablet Take 1 tablet by mouth daily.   nadolol (CORGARD) 40 MG tablet Take 1/2 (one-half) tablet by mouth once daily (Patient taking differently: Take 20 mg by mouth at bedtime.)   pantoprazole (PROTONIX) 40 MG tablet Take 1 tablet (40 mg total) by mouth 2 (two) times daily. (Patient taking differently: Take 40 mg by mouth daily as needed (acid reflux).)   pramipexole (MIRAPEX) 0.25 MG tablet Take 1 tablet (0.25 mg total) by mouth at bedtime.   rifaximin (XIFAXAN) 550 MG TABS tablet Take 550 mg by mouth 2 (two) times daily.   spironolactone (ALDACTONE) 25 MG tablet Take 25 mg by mouth every Monday, Wednesday, and Friday.   tadalafil (CIALIS) 20 MG tablet Take 0.5-1 tablets (10-20 mg total) by mouth every other day as needed for erectile dysfunction.   traZODone (DESYREL) 100 MG tablet One and one half qhs   triamcinolone cream (KENALOG) 0.1 % Apply thin amount bid prn   furosemide (LASIX) 40 MG tablet Take 1 tablet (40 mg total) by mouth 2 (two) times daily.   metolazone (ZAROXOLYN) 5 MG tablet Take 5 mg by mouth every Monday, Wednesday, and Friday.   ondansetron (ZOFRAN) 8 MG tablet TAKE 1 TABLET BY MOUTH THREE TIMES DAILY AS NEEDED FOR NAUSEA (Patient not taking: Reported on 07/27/2022)   No facility-administered encounter medications on file as of 07/27/2022.    ALLERGIES:  Allergies  Allergen Reactions   Codeine Nausea And Vomiting    LABORATORY DATA:  I  have reviewed the labs as listed.  CBC    Component Value Date/Time   WBC 7.6 07/20/2022 1053   RBC 3.77 (L) 07/20/2022 1053   HGB 12.4 (L) 07/20/2022 1053   HGB 9.1 (L) 03/02/2022 1158   HCT 35.1 (L) 07/20/2022 1053   HCT 26.8 (L) 03/02/2022 1158   PLT 113 (L) 07/20/2022 1053   PLT 128 (L) 03/02/2022 1158   MCV 93.1 07/20/2022 1053   MCV 91 03/02/2022 1158   MCH 32.9 07/20/2022 1053   MCHC 35.3 07/20/2022 1053   RDW 13.0 07/20/2022 1053   RDW 12.2 03/02/2022 1158   LYMPHSABS 1.4 07/20/2022 1053   LYMPHSABS 1.1 03/02/2022 1158   MONOABS 0.5 07/20/2022 1053   EOSABS 0.3 07/20/2022 1053   EOSABS 0.4 03/02/2022 1158   BASOSABS 0.0 07/20/2022 1053   BASOSABS 0.0 03/02/2022 1158      Latest Ref Rng & Units 07/20/2022   10:53 AM 05/22/2022    1:52 PM 04/20/2022    8:52 AM  CMP  Glucose 70 - 99 mg/dL 404  184  270   BUN 8 - 23 mg/dL 52  60  62   Creatinine 0.61 - 1.24 mg/dL 2.26  2.33  2.25   Sodium 135 - 145 mmol/L 136  138  136   Potassium 3.5 - 5.1 mmol/L 3.8  4.5  4.1   Chloride 98 - 111 mmol/L 96  102  103   CO2 22 - 32 mmol/L 27  27  23   $ Calcium 8.9 - 10.3 mg/dL 9.5  10.2    10.6  9.7   Total Protein 6.5 - 8.1 g/dL 8.2   7.7   Total Bilirubin 0.3 - 1.2 mg/dL 0.9   1.1   Alkaline Phos 38 - 126 U/L 167   138   AST 15 - 41 U/L 38   34   ALT 0 - 44 U/L 48   28     DIAGNOSTIC IMAGING:  I have independently reviewed the relevant imaging and discussed with the patient.   WRAP UP:  All questions were answered. The patient knows to call the clinic with any problems, questions or concerns.  Medical decision making: Moderate  Time spent on visit: I spent 20 minutes counseling the patient face to face. The total time spent in the appointment was 30 minutes and more than 50% was on counseling.  Harriett Rush, PA-C  07/27/22 11:50 AM

## 2022-07-27 NOTE — Patient Instructions (Signed)
Austin Morgan **   You were seen today by Tarri Abernethy PA-C for your follow-up visit.    ANEMIA: Your anemia is due to your iron deficiency and your chronic kidney disease. You do not need any IV iron at this time.  Continue to take your iron tablet at home daily. Dr. Theador Hawthorne has started you on Procrit injections.  Continue these injections per his instructions.  LOW PLATELETS: Your low platelets are most likely due to your cirrhosis. Your labs did show mild abnormality of your B12 labs (elevated methylmalonic acid). You should continue taking low-dose vitamin B12 500 mcg 3 days weekly (Monday/Wednesday/Friday)  FOLLOW-UP APPOINTMENT: We will check labs in 4 months and see you for an office visit 1 week after labs.  ** Thank you for trusting me with your healthcare!  I strive to provide all of my patients with quality care at each visit.  If you receive a survey for this visit, I would be so grateful to you for taking the time to provide feedback.  Thank you in advance!  ~ [name]                   Dr. Derek Jack   &   Tarri Abernethy, PA-C   - - - - - - - - - - - - - - - - - -    Thank you for choosing [place] at [facility] to provide your oncology and hematology care.  To afford each patient quality time with our provider, please arrive at least 15 minutes before your scheduled appointment time.   If you have a lab appointment with the [facility] please come in thru the Main Entrance and check in at the main information desk.  You need to re-schedule your appointment should you arrive 10 or more minutes late.  We strive to give you quality time with our providers, and arriving late affects you and other patients whose appointments are after yours.  Also, if you no show three or more times for appointments you may be dismissed from the clinic at the providers  discretion.     Again, thank you for choosing [facility].  Our hope is that these requests will decrease the amount of time that you wait before being seen by our physicians.       _____________________________________________________________  Should you have questions after your visit to [facility], please contact our office at [phone] and follow the prompts.  Our office hours are 8:00 a.m. and 4:30 p.m. Monday - Friday.  Please note that voicemails left after 4:00 p.m. may not be returned until the following business day.  We are closed weekends and major holidays.  You do have access to a nurse 24-7, just call the main number to the clinic [phone] and do not press any options, hold on the line and a nurse will answer the phone.    For prescription refill requests, have your pharmacy contact our office and allow 72 hours.

## 2022-07-30 ENCOUNTER — Other Ambulatory Visit: Payer: Self-pay

## 2022-07-30 DIAGNOSIS — D696 Thrombocytopenia, unspecified: Secondary | ICD-10-CM

## 2022-07-30 DIAGNOSIS — R161 Splenomegaly, not elsewhere classified: Secondary | ICD-10-CM

## 2022-07-30 DIAGNOSIS — D631 Anemia in chronic kidney disease: Secondary | ICD-10-CM

## 2022-07-30 DIAGNOSIS — D500 Iron deficiency anemia secondary to blood loss (chronic): Secondary | ICD-10-CM

## 2022-07-30 DIAGNOSIS — D61818 Other pancytopenia: Secondary | ICD-10-CM

## 2022-08-01 DIAGNOSIS — I5032 Chronic diastolic (congestive) heart failure: Secondary | ICD-10-CM | POA: Diagnosis not present

## 2022-08-01 DIAGNOSIS — M25512 Pain in left shoulder: Secondary | ICD-10-CM | POA: Diagnosis not present

## 2022-08-01 DIAGNOSIS — N189 Chronic kidney disease, unspecified: Secondary | ICD-10-CM | POA: Diagnosis not present

## 2022-08-01 DIAGNOSIS — M62512 Muscle wasting and atrophy, not elsewhere classified, left shoulder: Secondary | ICD-10-CM | POA: Diagnosis not present

## 2022-08-01 DIAGNOSIS — K7460 Unspecified cirrhosis of liver: Secondary | ICD-10-CM | POA: Diagnosis not present

## 2022-08-01 DIAGNOSIS — R293 Abnormal posture: Secondary | ICD-10-CM | POA: Diagnosis not present

## 2022-08-01 DIAGNOSIS — M25612 Stiffness of left shoulder, not elsewhere classified: Secondary | ICD-10-CM | POA: Diagnosis not present

## 2022-08-01 DIAGNOSIS — R809 Proteinuria, unspecified: Secondary | ICD-10-CM | POA: Diagnosis not present

## 2022-08-01 DIAGNOSIS — E1129 Type 2 diabetes mellitus with other diabetic kidney complication: Secondary | ICD-10-CM | POA: Diagnosis not present

## 2022-08-01 DIAGNOSIS — E1122 Type 2 diabetes mellitus with diabetic chronic kidney disease: Secondary | ICD-10-CM | POA: Diagnosis not present

## 2022-08-01 DIAGNOSIS — D6959 Other secondary thrombocytopenia: Secondary | ICD-10-CM | POA: Diagnosis not present

## 2022-08-01 DIAGNOSIS — D638 Anemia in other chronic diseases classified elsewhere: Secondary | ICD-10-CM | POA: Diagnosis not present

## 2022-08-01 DIAGNOSIS — I129 Hypertensive chronic kidney disease with stage 1 through stage 4 chronic kidney disease, or unspecified chronic kidney disease: Secondary | ICD-10-CM | POA: Diagnosis not present

## 2022-08-03 DIAGNOSIS — M25612 Stiffness of left shoulder, not elsewhere classified: Secondary | ICD-10-CM | POA: Diagnosis not present

## 2022-08-03 DIAGNOSIS — R293 Abnormal posture: Secondary | ICD-10-CM | POA: Diagnosis not present

## 2022-08-03 DIAGNOSIS — M25512 Pain in left shoulder: Secondary | ICD-10-CM | POA: Diagnosis not present

## 2022-08-03 DIAGNOSIS — M62512 Muscle wasting and atrophy, not elsewhere classified, left shoulder: Secondary | ICD-10-CM | POA: Diagnosis not present

## 2022-08-08 ENCOUNTER — Telehealth: Payer: Self-pay | Admitting: Family Medicine

## 2022-08-08 NOTE — Telephone Encounter (Signed)
Contacted [name] to schedule their annual wellness visit. Appointment made for 08/24/2022.  Thank you,  Colletta Maryland,  [place] [facility] Direct Dial ??[phone]

## 2022-08-09 ENCOUNTER — Encounter: Payer: Self-pay | Admitting: Radiology

## 2022-08-14 DIAGNOSIS — E1169 Type 2 diabetes mellitus with other specified complication: Secondary | ICD-10-CM | POA: Diagnosis not present

## 2022-08-15 DIAGNOSIS — M25612 Stiffness of left shoulder, not elsewhere classified: Secondary | ICD-10-CM | POA: Diagnosis not present

## 2022-08-15 DIAGNOSIS — M62512 Muscle wasting and atrophy, not elsewhere classified, left shoulder: Secondary | ICD-10-CM | POA: Diagnosis not present

## 2022-08-15 DIAGNOSIS — R293 Abnormal posture: Secondary | ICD-10-CM | POA: Diagnosis not present

## 2022-08-15 DIAGNOSIS — M25512 Pain in left shoulder: Secondary | ICD-10-CM | POA: Diagnosis not present

## 2022-08-24 ENCOUNTER — Ambulatory Visit (INDEPENDENT_AMBULATORY_CARE_PROVIDER_SITE_OTHER): Payer: Medicare HMO

## 2022-08-24 VITALS — Ht 66.0 in | Wt 189.0 lb

## 2022-08-24 DIAGNOSIS — Z0000 Encounter for general adult medical examination without abnormal findings: Secondary | ICD-10-CM | POA: Diagnosis not present

## 2022-08-24 NOTE — Progress Notes (Signed)
Subjective:   Austin Morgan is a 67 y.o. male who presents for an Initial Medicare Annual Wellness Visit.  I connected with  Austin Morgan on 08/24/22 by a audio enabled telemedicine application and verified that I am speaking with the correct person using two identifiers.  Patient Location: Home  Provider Location: Office/Clinic  I discussed the limitations of evaluation and management by telemedicine. The patient expressed understanding and agreed to proceed.  Review of Systems     Cardiac Risk Factors include: advanced age (>75men, >21 women);diabetes mellitus;hypertension;male gender;dyslipidemia;sedentary lifestyle     Objective:    Today's Vitals   08/24/22 1315  Weight: 189 lb (85.7 kg)  Height: 5\' 6"  (1.676 m)   Body mass index is 30.51 kg/m.     08/24/2022    1:18 PM 07/27/2022   11:04 AM 05/23/2022    9:02 AM 05/22/2022    8:12 AM 04/20/2022    9:22 AM 03/23/2022    3:19 PM 03/13/2022    2:28 PM  Advanced Directives  Does Patient Have a Medical Advance Directive? No No No No No No No  Does patient want to make changes to medical advance directive? No - Patient declined        Would patient like information on creating a medical advance directive?  No - Patient declined No - Patient declined No - Patient declined No - Patient declined No - Patient declined No - Patient declined    Current Medications (verified) Outpatient Encounter Medications as of 08/24/2022  Medication Sig   albuterol (VENTOLIN HFA) 108 (90 Base) MCG/ACT inhaler INHALE 2 PUFFS BY MOUTH EVERY 6 HOURS AS NEEDED FOR WHEEZING   BD PEN NEEDLE NANO 2ND GEN 32G X 4 MM MISC USE TO ADMINISTER INSULIN   Continuous Blood Gluc Receiver (DEXCOM G6 RECEIVER) DEVI 1 Device by Does not apply route continuous.   Continuous Blood Gluc Sensor (DEXCOM G6 SENSOR) MISC 1 Units by Does not apply route continuous. (Patient taking differently: 1 Units by Does not apply route continuous. G7)   cyanocobalamin (VITAMIN  B12) 500 MCG tablet Take 500 mcg by mouth every Monday, Wednesday, and Friday.   diclofenac Sodium (VOLTAREN) 1 % GEL Apply 2 g topically in the morning, at noon, and at bedtime.   ferrous sulfate 325 (65 FE) MG EC tablet Take 325 mg by mouth every morning.   furosemide (LASIX) 40 MG tablet Take 1 tablet (40 mg total) by mouth 2 (two) times daily.   insulin aspart (NOVOLOG FLEXPEN) 100 UNIT/ML FlexPen Inject 20 units in the AM, Inject 10 units at lunch, and inject 20 units at supper   insulin glargine (LANTUS SOLOSTAR) 100 UNIT/ML Solostar Pen Inject 66 units subcutaneously every evening may titrate up to 80 units   lactulose (CHRONULAC) 10 GM/15ML solution Take 30 mLs (20 g total) by mouth in the morning, at noon, in the evening, and at bedtime.   magnesium oxide (MAG-OX) 400 (240 Mg) MG tablet Take 400 mg by mouth daily.   Multiple Vitamins-Minerals (MULTIVITAMIN WITH MINERALS) tablet Take 1 tablet by mouth daily.   nadolol (CORGARD) 40 MG tablet Take 1/2 (one-half) tablet by mouth once daily (Patient taking differently: Take 20 mg by mouth at bedtime.)   ondansetron (ZOFRAN) 8 MG tablet TAKE 1 TABLET BY MOUTH THREE TIMES DAILY AS NEEDED FOR NAUSEA   pantoprazole (PROTONIX) 40 MG tablet Take 1 tablet (40 mg total) by mouth 2 (two) times daily. (Patient taking differently: Take 40 mg by  mouth daily as needed (acid reflux).)   pramipexole (MIRAPEX) 0.25 MG tablet Take 1 tablet (0.25 mg total) by mouth at bedtime.   rifaximin (XIFAXAN) 550 MG TABS tablet Take 550 mg by mouth 2 (two) times daily.   spironolactone (ALDACTONE) 25 MG tablet Take 25 mg by mouth every Monday, Wednesday, and Friday.   tadalafil (CIALIS) 20 MG tablet Take 0.5-1 tablets (10-20 mg total) by mouth every other day as needed for erectile dysfunction.   traZODone (DESYREL) 100 MG tablet One and one half qhs   triamcinolone cream (KENALOG) 0.1 % Apply thin amount bid prn   metolazone (ZAROXOLYN) 5 MG tablet Take 5 mg by mouth  every Monday, Wednesday, and Friday.   No facility-administered encounter medications on file as of 08/24/2022.    Allergies (verified) Codeine   History: Past Medical History:  Diagnosis Date   Anxiety    Arthritis    Depression    Diabetes mellitus without complication ([place])    ED (erectile dysfunction)    Gout    H/O ETOH abuse    Hyperlipidemia    Hypertension    Hypertriglyceridemia    Iron deficiency anemia due to chronic blood loss 12/20/2021   PONV (postoperative nausea and vomiting)    Sleep apnea    Past Surgical History:  Procedure Laterality Date   CARPAL TUNNEL RELEASE Bilateral    COLONOSCOPY  2010   Dr. Delfin Edis: mild diverticulosis, next colonoscopy 2020   COLONOSCOPY WITH PROPOFOL N/A 01/21/2020   Rourk: 9 polyps removed, multiple tubular adenomas.  Next colonoscopy in 3 years.   ELBOW SURGERY     ESOPHAGEAL BANDING  09/28/2021   Procedure: ESOPHAGEAL BANDING;  Surgeon: [name], MD;  [facility];  Service: Endoscopy;;   ESOPHAGOGASTRODUODENOSCOPY (EGD) WITH PROPOFOL N/A 01/21/2020   Rourk: normal   ESOPHAGOGASTRODUODENOSCOPY (EGD) WITH PROPOFOL N/A 09/28/2021   Procedure: ESOPHAGOGASTRODUODENOSCOPY (EGD) WITH PROPOFOL;  Surgeon: [name], MD;  [facility]: [facility];  Service: Endoscopy;  Laterality: N/A;  3:00pm   ESOPHAGOGASTRODUODENOSCOPY (EGD) WITH PROPOFOL N/A 10/31/2021   Procedure: ESOPHAGOGASTRODUODENOSCOPY (EGD) WITH PROPOFOL;  Surgeon: Harvel Quale, MD;  [facility]: [facility];  Service: Gastroenterology;  Laterality: N/A;   ESOPHAGOGASTRODUODENOSCOPY (EGD) WITH PROPOFOL N/A 05/23/2022   Procedure: ESOPHAGOGASTRODUODENOSCOPY (EGD) WITH PROPOFOL;  Surgeon: [name], MD;  [facility]: [facility];  Service: Endoscopy;  Laterality: N/A;  8:30am, asa 3   NECK SURGERY     c4   POLYPECTOMY  01/21/2020   Procedure: POLYPECTOMY;  Surgeon: [name], MD;  Location: [facility];  Service: Endoscopy;;   colon   Family History  Problem Relation Age of Onset   Heart attack Father    Heart Problems Brother        Had open heart surgery   Colon cancer Neg Hx    Social History   Socioeconomic History   Marital status: Married    Spouse name: Not on file   Number of children: Not on file   Years of education: Not on file   Highest education level: Not on file  Occupational History   Not on file  Tobacco Use   Smoking status: Former    Packs/day: 1.00    Years: 36.00    Additional pack years: 0.00    Total pack years: 36.00    Types: Cigarettes    Quit date: 03/11/2016    Years since quitting: 6.4    Passive exposure:  Past   Smokeless tobacco: Never  Vaping Use   Vaping Use: Never used  Substance and Sexual Activity   Alcohol use: Not Currently    Comment: drinks a fifth of liquor per week; inpatient rehab 08/2019. denied 10/14/19. No etoh since 03/2021 (as of 09/22/21)   Drug use: No   Sexual activity: Yes  Other Topics Concern   Not on file  Social History Narrative   Wife, Azrael Diallo NP [facility]   Social Determinants of Health   Financial Resource Strain: Low Risk  (08/24/2022)   Overall Financial Resource Strain (CARDIA)    Difficulty of Paying Living Expenses: Not hard at all  Food Insecurity: No Food Insecurity (08/24/2022)   Hunger Vital Sign    Worried About Running Out of Food in the Last Year: Never true    Ran Out of Food in the Last Year: Never true  Transportation Needs: No Transportation Needs (08/24/2022)   PRAPARE - Hydrologist (Medical): No    Lack of Transportation (Non-Medical): No  Physical Activity: Inactive (08/24/2022)   Exercise Vital Sign    Days of Exercise per Week: 0 days    Minutes of Exercise per Session: 0 min  Stress: No Stress Concern Present (08/24/2022)   [place] of Stress : Not at all  Social  Connections: [place] (08/24/2022)   Social Connection and Isolation Panel [NHANES]    Frequency of Communication with Friends and Family: More than three times a week    Frequency of Social Gatherings with Friends and Family: More than three times a week    Attends Religious Services: More than 4 times per year    Active Member of [employer] or Organizations: Yes    Attends Music therapist: More than 4 times per year    Marital Status: Married    Tobacco Counseling Counseling given: Not Answered   Clinical Intake:  Pre-visit preparation completed: Yes  Pain : No/denies pain  Diabetes: Yes CBG done?: No Did pt. bring in CBG monitor from home?: No (reports that blood sugars having been running in normal range)  How often do you need to have someone help you when you read instructions, pamphlets, or other written materials from your doctor or pharmacy?: 1 - Never  Diabetic?Yes   Nutrition Risk Assessment:  Has the patient had any N/V/D within the last 2 months?  No  Does the patient have any non-healing wounds?  No  Has the patient had any unintentional weight loss or weight gain?  No   Diabetes:  Is the patient diabetic?  Yes  If diabetic, was a CBG obtained today?  No  Did the patient bring in their glucometer from home?  No  How often do you monitor your CBG's? Dexcom.   Financial Strains and Diabetes Management:  Are you having any financial strains with the device, your supplies or your medication? No .  Does the patient want to be seen by Chronic Care Management for management of their diabetes?  No  Would the patient like to be referred to a Nutritionist or for Diabetic Management?  No   Diabetic Exams:  Diabetic Eye Exam: Completed 11/14/21 Diabetic Foot Exam: Completed 03/02/22   Interpreter Needed?: No  Information entered by :: Denman George LPN   Activities of Daily Living    08/24/2022    1:18 PM 05/22/2022  8:14 AM  In your  present state of health, do you have any difficulty performing the following activities:  Hearing? 0   Vision? 0   Difficulty concentrating or making decisions? 0   Walking or climbing stairs? 0   Dressing or bathing? 0   Doing errands, shopping? 0 0  Preparing Food and eating ? N   Using the Toilet? N   In the past six months, have you accidently leaked urine? N   Do you have problems with loss of bowel control? N   Managing your Medications? N   Managing your Finances? N   Housekeeping or managing your Housekeeping? N     Patient Care Team: Kathyrn Drown, MD as PCP - General (Family Medicine) Liana Gerold, MD as Consulting Physician (Nephrology) Gala Romney Cristopher Estimable, MD as Consulting Physician (Gastroenterology) Roosevelt Locks, [place] as Nurse Practitioner (Nurse Practitioner) Madelin Headings, DO (Optometry) Carole Civil, MD as Consulting Physician (Orthopedic Surgery) Konrad Saha as Physician Assistant (Oncology) Zamor, Ander Gaster, MD as Attending Physician (Internal Medicine)  Indicate any recent Medical Services you may have received from other than [facility] providers in the past year (date may be approximate).     Assessment:   This is a routine wellness examination for Crispin.  Hearing/Vision screen Hearing Screening - Comments:: Denies hearing difficulties  Vision Screening - Comments:: Wears rx glasses - up to date with routine eye exams with [facility] Dr. Linna Hoff    Dietary issues and exercise activities discussed: Current Exercise Habits: The patient does not participate in regular exercise at present   Goals Addressed             This Visit's Progress    Remain active and independent        Depression Screen    08/24/2022    1:17 PM 03/23/2022    3:11 PM 03/02/2022   11:20 AM 12/05/2020    4:12 PM 09/18/2019    1:49 PM 09/09/2017    8:56 AM 09/17/2014    3:42 PM  PHQ 2/9 Scores  PHQ - 2 Score 0 0 0 0 0 0 0  PHQ- 9 Score     3       Fall Risk    08/24/2022    1:16 PM 03/23/2022    3:06 PM 03/02/2022   11:20 AM 02/05/2022    9:58 AM 12/05/2020    4:12 PM  Fall Risk   Falls in the past year? 0 0 0 0 0  Number falls in past yr: 0 0 0 0 0  Injury with Fall? 0 0 0 0 0  Risk for fall due to : No Fall Risks No Fall Risks No Fall Risks No Fall Risks No Fall Risks  Follow up Falls prevention discussed;Education provided;Falls evaluation completed Falls evaluation completed Falls evaluation completed Falls evaluation completed Falls evaluation completed    FALL RISK PREVENTION PERTAINING TO THE HOME:  Any stairs in or around the home? Yes  If so, are there any without handrails? No  Home free of loose throw rugs in walkways, pet beds, electrical cords, etc? Yes  Adequate lighting in your home to reduce risk of falls? Yes   ASSISTIVE DEVICES UTILIZED TO PREVENT FALLS:  Life alert? No  Use of a cane, walker or w/c? No  Grab bars in the bathroom? Yes  Shower chair or bench in shower? No  Elevated toilet seat or a handicapped toilet? Yes   TIMED UP AND  GO:  Was the test performed? No . Telephonic visit   Cognitive Function:        08/24/2022    1:19 PM  6CIT Screen  What Year? 0 points  What month? 0 points  What time? 0 points  Count back from 20 0 points  Months in reverse 0 points  Repeat phrase 0 points  Total Score 0 points    Immunizations Immunization History  Administered Date(s) Administered   Fluad Quad(high Dose 65+) 03/02/2022   Influenza,inj,Quad PF,6+ Mos 04/15/2015, 06/29/2016, 02/25/2017   Influenza-Unspecified 07/12/2012, 04/11/2014, 04/08/2018   PFIZER(Purple Top)SARS-COV-2 Vaccination 09/07/2019, 10/02/2019   PNEUMOCOCCAL CONJUGATE-20 09/14/2021   Pneumococcal Polysaccharide-23 06/21/2014   Td 03/02/2022    TDAP status: Up to date  Flu Vaccine status: Up to date  Pneumococcal vaccine status: Up to date  Covid-19 vaccine status: Information provided on how to obtain vaccines.    Qualifies for Shingles Vaccine? Yes   Zostavax completed No   Shingrix Completed?: No.    Education has been provided regarding the importance of this vaccine. Patient has been advised to call insurance company to determine out of pocket expense if they have not yet received this vaccine. Advised may also receive vaccine at local pharmacy or Health Dept. Verbalized acceptance and understanding.  Screening Tests Health Maintenance  Topic Date Due   Zoster Vaccines- Shingrix (1 of 2) Never done   Lung Cancer Screening  Never done   COVID-19 Vaccine (3 - Pfizer risk series) 10/30/2019   OPHTHALMOLOGY EXAM  11/15/2022   HEMOGLOBIN A1C  01/18/2023   COLONOSCOPY (Pts 45-35yrs Insurance coverage will need to be confirmed)  01/21/2023   FOOT EXAM  03/03/2023   Diabetic kidney evaluation - Urine ACR  05/23/2023   Diabetic kidney evaluation - eGFR measurement  07/21/2023   Medicare Annual Wellness (AWV)  08/24/2023   DTaP/Tdap/Td (2 - Tdap) 03/02/2032   Pneumonia Vaccine 56+ Years old  Completed   INFLUENZA VACCINE  Completed   Hepatitis C Screening  Completed   HPV VACCINES  Aged Out    Health Maintenance  Health Maintenance Due  Topic Date Due   Zoster Vaccines- Shingrix (1 of 2) Never done   Lung Cancer Screening  Never done   COVID-19 Vaccine (3 - Pfizer risk series) 10/30/2019    Colorectal cancer screening: Type of screening: Colonoscopy. Completed 01/21/20. Repeat every 3 years  Lung Cancer Screening: (Low Dose CT Chest recommended if Age 28-80 years, 30 pack-year currently smoking OR have quit w/in 15years.) does qualify.   Lung Cancer Screening Referral: scheduled but pt is considering cancelling visit   Additional Screening:  Hepatitis C Screening: does qualify; Completed 03/18/19  Vision Screening: Recommended annual ophthalmology exams for early detection of glaucoma and other disorders of the eye. Is the patient up to date with their annual eye exam?  Yes  Who is the  provider or what is the name of the office in which the patient attends annual eye exams? Dr. Jorja Loa  If pt is not established with a provider, would they like to be referred to a provider to establish care? No .   Dental Screening: Recommended annual dental exams for proper oral hygiene  Community Resource Referral / Chronic Care Management: CRR required this visit?  No   CCM required this visit?  No      Plan:     I have personally reviewed and noted the following in the patient's chart:   Medical and social history  Use of alcohol, tobacco or illicit drugs  Current medications and supplements including opioid prescriptions. Patient is not currently taking opioid prescriptions. Functional ability and status Nutritional status Physical activity Advanced directives List of other physicians Hospitalizations, surgeries, and ER visits in previous 12 months Vitals Screenings to include cognitive, depression, and falls Referrals and appointments  In addition, I have reviewed and discussed with patient certain preventive protocols, quality metrics, and best practice recommendations. A written personalized care plan for preventive services as well as general preventive health recommendations were provided to patient.     Denman George [place], [place]   [id number]   Nurse Notes: No concerns

## 2022-08-24 NOTE — Patient Instructions (Signed)
Austin Morgan , Thank you for taking time to come for your Medicare Wellness Visit. I appreciate your ongoing commitment to your health goals. Please review the following plan we discussed and let me know if I can assist you in the future.   These are the goals we discussed:  Goals      Remain active and independent        This is a list of the screening recommended for you and due dates:  Health Maintenance  Topic Date Due   Zoster (Shingles) Vaccine (1 of 2) Never done   Screening for Lung Cancer  Never done   COVID-19 Vaccine (3 - Pfizer risk series) 10/30/2019   Eye exam for diabetics  11/15/2022   Hemoglobin A1C  01/18/2023   Colon Cancer Screening  01/21/2023   Complete foot exam   03/03/2023   Yearly kidney health urinalysis for diabetes  05/23/2023   Yearly kidney function blood test for diabetes  07/21/2023   Medicare Annual Wellness Visit  08/24/2023   DTaP/Tdap/Td vaccine (2 - Tdap) 03/02/2032   Pneumonia Vaccine  Completed   Flu Shot  Completed   Hepatitis C Screening: USPSTF Recommendation to screen - Ages 18-66 yo.  Completed   HPV Vaccine  Aged Out    Advanced directives: Forms are available if you choose in the future to pursue completion.  This is recommended in order to make sure that your health wishes are honored in the event that you are unable to verbalize them to the provider.    Conditions/risks identified: Aim for 30 minutes of exercise or brisk walking, 6-8 glasses of water, and 5 servings of fruits and vegetables each day.   Next appointment: Follow up in one year for your annual wellness visit.   Preventive Care 66 Years and Older, Male  Preventive care refers to lifestyle choices and visits with your health care provider that can promote health and wellness. What does preventive care include? A yearly physical exam. This is also called an annual well check. Dental exams once or twice a year. Routine eye exams. Ask your health care provider how often  you should have your eyes checked. Personal lifestyle choices, including: Daily care of your teeth and gums. Regular physical activity. Eating a healthy diet. Avoiding tobacco and drug use. Limiting alcohol use. Practicing safe sex. Taking low doses of aspirin every day. Taking vitamin and mineral supplements as recommended by your health care provider. What happens during an annual well check? The services and screenings done by your health care provider during your annual well check will depend on your age, overall health, lifestyle risk factors, and family history of disease. Counseling  Your health care provider may ask you questions about your: Alcohol use. Tobacco use. Drug use. Emotional well-being. Home and relationship well-being. Sexual activity. Eating habits. History of falls. Memory and ability to understand (cognition). Work and work Statistician. Screening  You may have the following tests or measurements: Height, weight, and BMI. Blood pressure. Lipid and cholesterol levels. These may be checked every 5 years, or more frequently if you are over 66 years old. Skin check. Lung cancer screening. You may have this screening every year starting at age 66 if you have a 30-pack-year history of smoking and currently smoke or have quit within the past 15 years. Fecal occult blood test (FOBT) of the stool. You may have this test every year starting at age 66. Flexible sigmoidoscopy or colonoscopy. You may have a sigmoidoscopy  every 5 years or a colonoscopy every 10 years starting at age 66. Prostate cancer screening. Recommendations will vary depending on your family history and other risks. Hepatitis C blood test. Hepatitis B blood test. Sexually transmitted disease (STD) testing. Diabetes screening. This is done by checking your blood sugar (glucose) after you have not eaten for a while (fasting). You may have this done every 1-3 years. Abdominal aortic aneurysm (AAA)  screening. You may need this if you are a current or former smoker. Osteoporosis. You may be screened starting at age 66 if you are at high risk. Talk with your health care provider about your test results, treatment options, and if necessary, the need for more tests. Vaccines  Your health care provider may recommend certain vaccines, such as: Influenza vaccine. This is recommended every year. Tetanus, diphtheria, and acellular pertussis (Tdap, Td) vaccine. You may need a Td booster every 10 years. Zoster vaccine. You may need this after age 66. Pneumococcal 13-valent conjugate (PCV13) vaccine. One dose is recommended after age 66. Pneumococcal polysaccharide (PPSV23) vaccine. One dose is recommended after age 66. Talk to your health care provider about which screenings and vaccines you need and how often you need them. This information is not intended to replace advice given to you by your health care provider. Make sure you discuss any questions you have with your health care provider. Document Released: 06/24/2015 Document Revised: 02/15/2016 Document Reviewed: 03/29/2015 Elsevier Interactive Patient Education  2017 [place] Prevention in the Home Falls can cause injuries. They can happen to people of all ages. There are many things you can do to make your home safe and to help prevent falls. What can I do on the outside of my home? Regularly fix the edges of walkways and driveways and fix any cracks. Remove anything that might make you trip as you walk through a door, such as a raised step or threshold. Trim any bushes or trees on the path to your home. Use bright outdoor lighting. Clear any walking paths of anything that might make someone trip, such as rocks or tools. Regularly check to see if handrails are loose or broken. Make sure that both sides of any steps have handrails. Any raised decks and porches should have guardrails on the edges. Have any leaves, snow, or ice  cleared regularly. Use sand or salt on walking paths during winter. Clean up any spills in your garage right away. This includes oil or grease spills. What can I do in the bathroom? Use night lights. Install grab bars by the toilet and in the tub and shower. Do not use towel bars as grab bars. Use non-skid mats or decals in the tub or shower. If you need to sit down in the shower, use a plastic, non-slip stool. Keep the floor dry. Clean up any water that spills on the floor as soon as it happens. Remove soap buildup in the tub or shower regularly. Attach bath mats securely with double-sided non-slip rug tape. Do not have throw rugs and other things on the floor that can make you trip. What can I do in the bedroom? Use night lights. Make sure that you have a light by your bed that is easy to reach. Do not use any sheets or blankets that are too big for your bed. They should not hang down onto the floor. Have a firm chair that has side arms. You can use this for support while you get dressed. Do not have  throw rugs and other things on the floor that can make you trip. What can I do in the kitchen? Clean up any spills right away. Avoid walking on wet floors. Keep items that you use a lot in easy-to-reach places. If you need to reach something above you, use a strong step stool that has a grab bar. Keep electrical cords out of the way. Do not use floor polish or wax that makes floors slippery. If you must use wax, use non-skid floor wax. Do not have throw rugs and other things on the floor that can make you trip. What can I do with my stairs? Do not leave any items on the stairs. Make sure that there are handrails on both sides of the stairs and use them. Fix handrails that are broken or loose. Make sure that handrails are as long as the stairways. Check any carpeting to make sure that it is firmly attached to the stairs. Fix any carpet that is loose or worn. Avoid having throw rugs at the  top or bottom of the stairs. If you do have throw rugs, attach them to the floor with carpet tape. Make sure that you have a light switch at the top of the stairs and the bottom of the stairs. If you do not have them, ask someone to add them for you. What else can I do to help prevent falls? Wear shoes that: Do not have high heels. Have rubber bottoms. Are comfortable and fit you well. Are closed at the toe. Do not wear sandals. If you use a stepladder: Make sure that it is fully opened. Do not climb a closed stepladder. Make sure that both sides of the stepladder are locked into place. Ask someone to hold it for you, if possible. Clearly mark and make sure that you can see: Any grab bars or handrails. First and last steps. Where the edge of each step is. Use tools that help you move around (mobility aids) if they are needed. These include: Canes. Walkers. Scooters. Crutches. Turn on the lights when you go into a dark area. Replace any light bulbs as soon as they burn out. Set up your furniture so you have a clear path. Avoid moving your furniture around. If any of your floors are uneven, fix them. If there are any pets around you, be aware of where they are. Review your medicines with your doctor. Some medicines can make you feel dizzy. This can increase your chance of falling. Ask your doctor what other things that you can do to help prevent falls. This information is not intended to replace advice given to you by your health care provider. Make sure you discuss any questions you have with your health care provider. Document Released: 03/24/2009 Document Revised: 11/03/2015 Document Reviewed: 07/02/2014 Elsevier Interactive Patient Education  2017 [employer].

## 2022-08-27 ENCOUNTER — Encounter: Payer: Self-pay | Admitting: Orthopedic Surgery

## 2022-08-27 ENCOUNTER — Ambulatory Visit: Payer: Medicare HMO | Admitting: Orthopedic Surgery

## 2022-08-27 DIAGNOSIS — G8929 Other chronic pain: Secondary | ICD-10-CM | POA: Diagnosis not present

## 2022-08-27 DIAGNOSIS — M25512 Pain in left shoulder: Secondary | ICD-10-CM | POA: Diagnosis not present

## 2022-08-27 NOTE — Progress Notes (Signed)
FOLLOW-UP OFFICE VISIT   Encounter Diagnosis  Name Primary?   Chronic left shoulder pain Yes   Chief Complaint  Patient presents with   Shoulder Pain    LT shoulder/improved somewhat 8 week follow up     HPI (and prior treatment)HPI (07/02/22) Austin Morgan is a 67 y.o. male.  He presents for evaluation of left shoulder.  A couple of months ago he had to go to the hospital via EMS.  He was placed in a chair to get him out of the house.  He stayed in the hospital for 36 hours in a coma.  When he woke up his left shoulder was hurting.  He has taken some ibuprofen he has not had any physical therapy or injection   He notes extension and inability to get his hand all the way to his back pocket.  Some pain anteriorly.  treated with physical therapy and subacromial injection here for follow-up  Current symptoms: The patient reports he has improved significantly with better range of motion and no pain  + EXAM FINDINGS: He has excellent strength in forward flexion and abduction he can internally rotate his left shoulder almost equal to the opposite side  ASSESSMENT AND PLAN Resolved left shoulder pain and improved range of motion recommend continued exercises for 3 weeks follow-up as needed patient has reached expected outcome with improvement

## 2022-09-03 DIAGNOSIS — K7682 Hepatic encephalopathy: Secondary | ICD-10-CM | POA: Diagnosis not present

## 2022-09-03 DIAGNOSIS — I8510 Secondary esophageal varices without bleeding: Secondary | ICD-10-CM | POA: Diagnosis not present

## 2022-09-03 DIAGNOSIS — K7030 Alcoholic cirrhosis of liver without ascites: Secondary | ICD-10-CM | POA: Diagnosis not present

## 2022-09-03 DIAGNOSIS — R69 Illness, unspecified: Secondary | ICD-10-CM | POA: Diagnosis not present

## 2022-09-07 ENCOUNTER — Institutional Professional Consult (permissible substitution): Payer: Medicare HMO | Admitting: Internal Medicine

## 2022-09-10 ENCOUNTER — Telehealth: Payer: Self-pay | Admitting: Family Medicine

## 2022-09-10 DIAGNOSIS — E119 Type 2 diabetes mellitus without complications: Secondary | ICD-10-CM

## 2022-09-10 NOTE — Telephone Encounter (Signed)
Nurses we will also forward a copy of this message to Neil Crouch, PA Patient is due for diabetes follow-up in June no later than July recommend A1c urine ACR There is no need to do additional lab work at this point because other specialist ordering labs  Also the specialist from [facility] who recently saw him recommended that he follow-up with gastroenterology in June for ultrasound of his liver for follow-up on hepatoma.  Please make sure patient connects with GI  (We will also forward a copy of this message to Neil Crouch, [place])

## 2022-09-11 DIAGNOSIS — R69 Illness, unspecified: Secondary | ICD-10-CM | POA: Diagnosis not present

## 2022-09-11 DIAGNOSIS — K7030 Alcoholic cirrhosis of liver without ascites: Secondary | ICD-10-CM | POA: Diagnosis not present

## 2022-09-11 NOTE — Telephone Encounter (Signed)
Nurses-thanks for update.  Also follow through with lab work and regular follow-up visit with myself by June-regarding the diabetes thanks

## 2022-09-11 NOTE — Addendum Note (Signed)
Addended by: Dairl Ponder on: 09/11/2022 04:11 PM   Modules accepted: Orders

## 2022-09-11 NOTE — Telephone Encounter (Signed)
See below. Patient is NIC'd for appt in June. Please go ahead and schedule OV in June 2024 for follow up cirrhosis.   He will need RUQ U/S in 11/2022, please NIC. Dx: hepatoma screening.

## 2022-09-24 IMAGING — US US RENAL
1 series · 14 of 25 positions shown · non-contrast
Comparison: None Available.

CLINICAL DATA: Acute renal insufficiency

EXAM:
RENAL / URINARY TRACT ULTRASOUND COMPLETE

[Series 1: us renal · 14 of 95 slices shown]
[im 1/95]
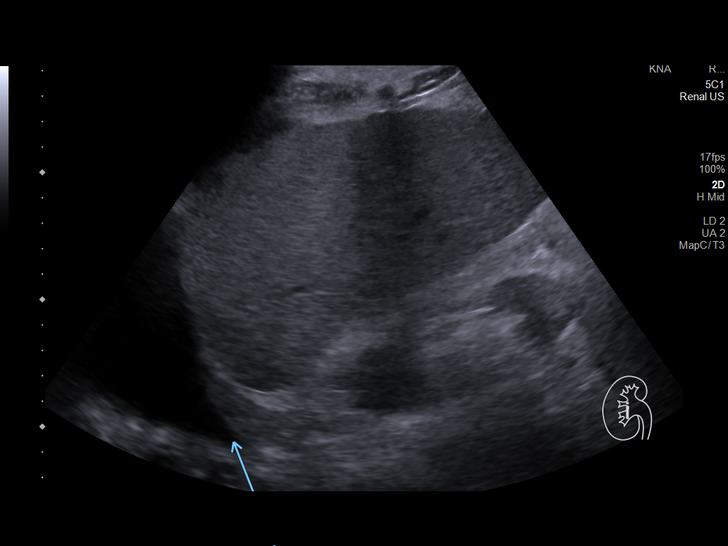
[im 8/95]
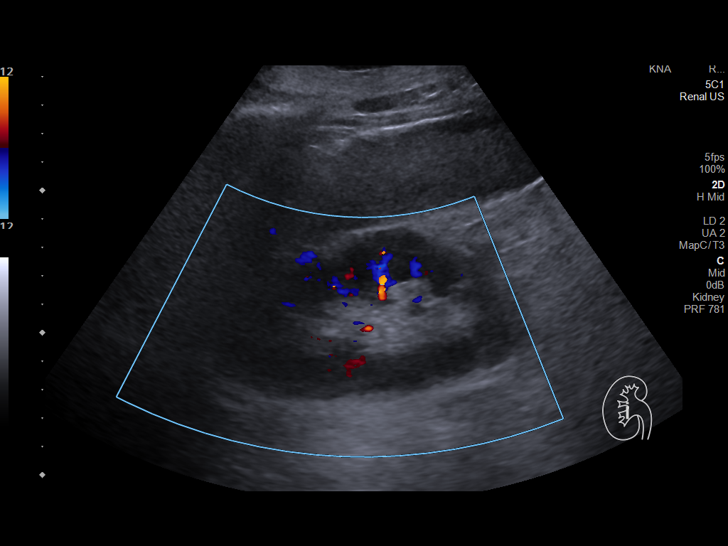
[im 16/95]
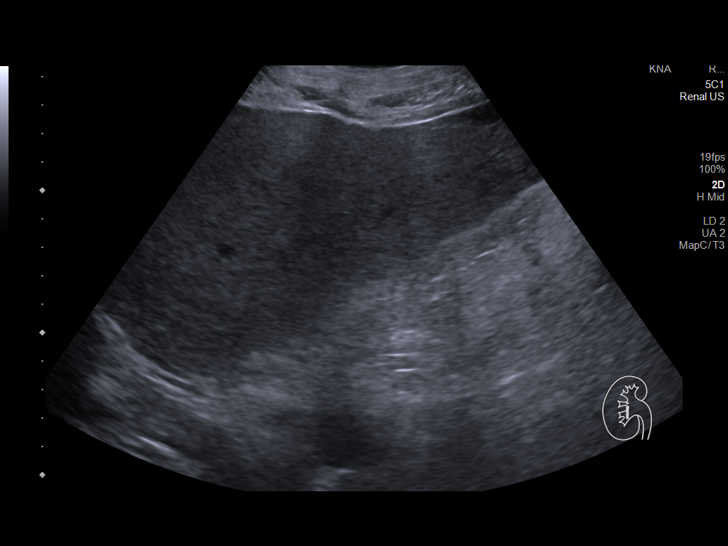
[im 24/95]
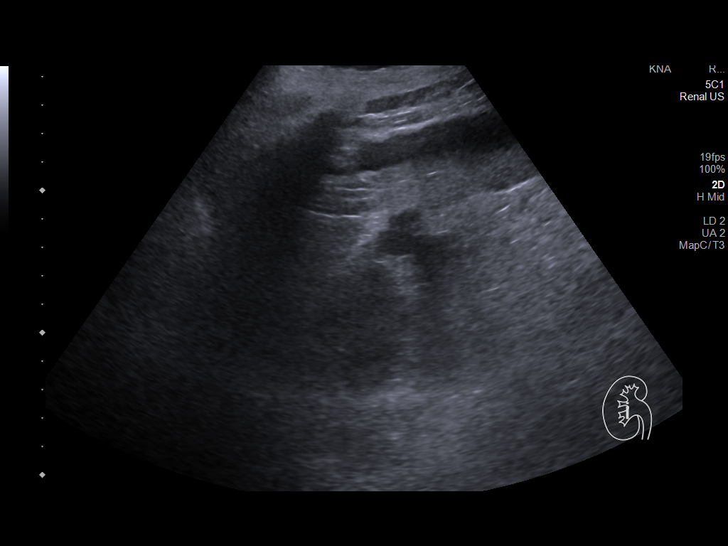
[im 32/95]
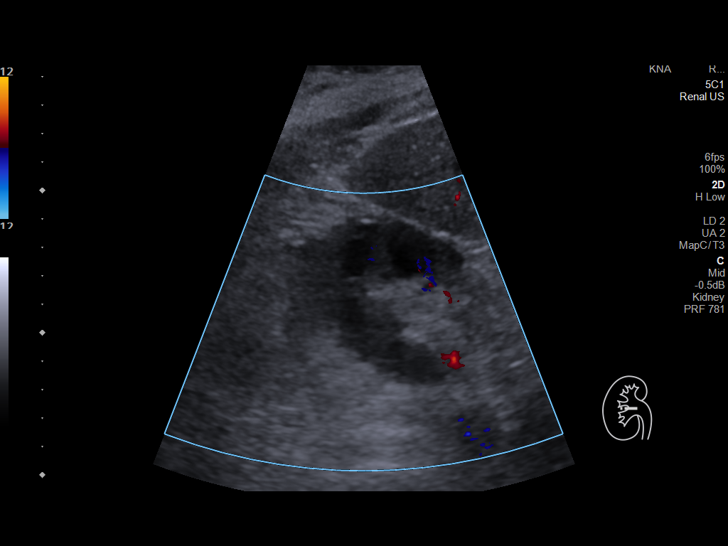
[im 36/95]
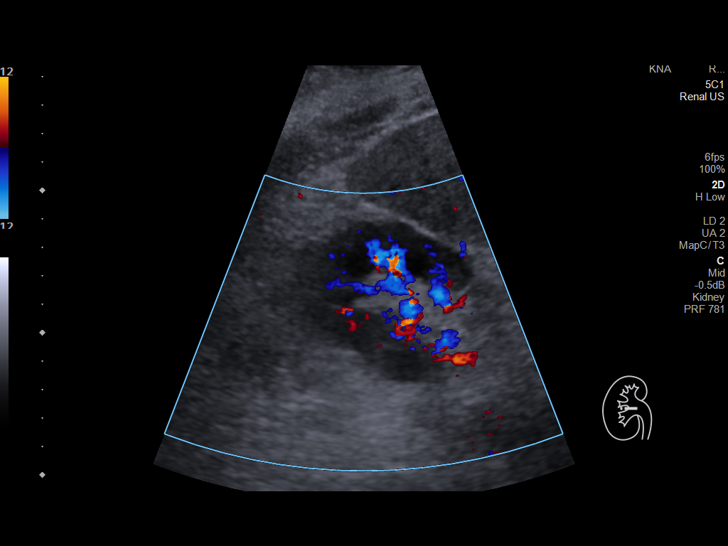
[im 44/95]
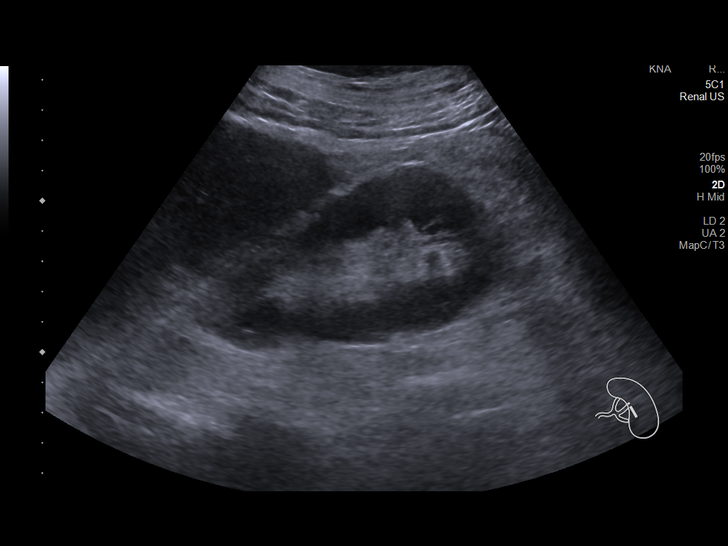
[im 51/95]
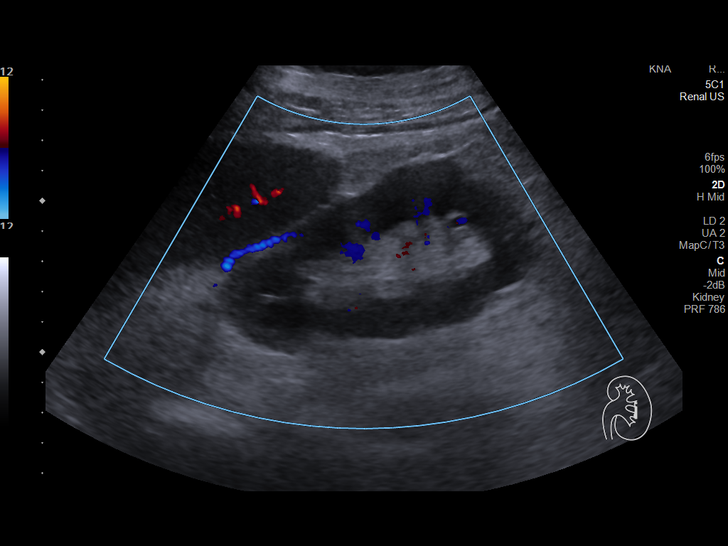
[im 59/95]
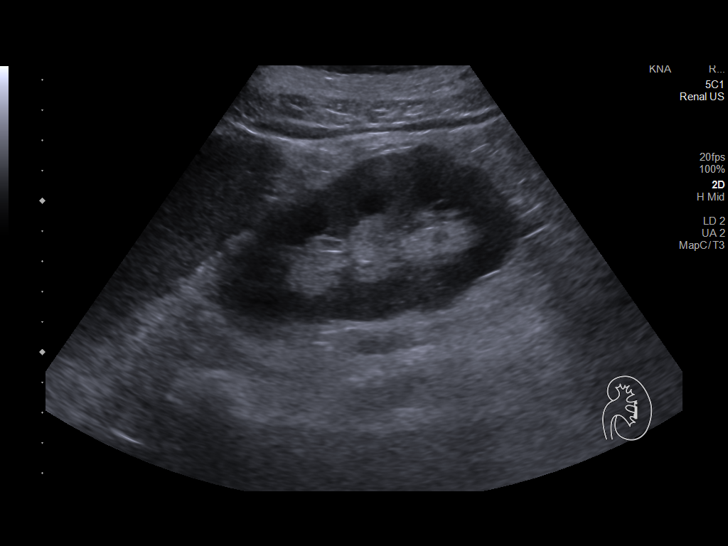
[im 63/95]
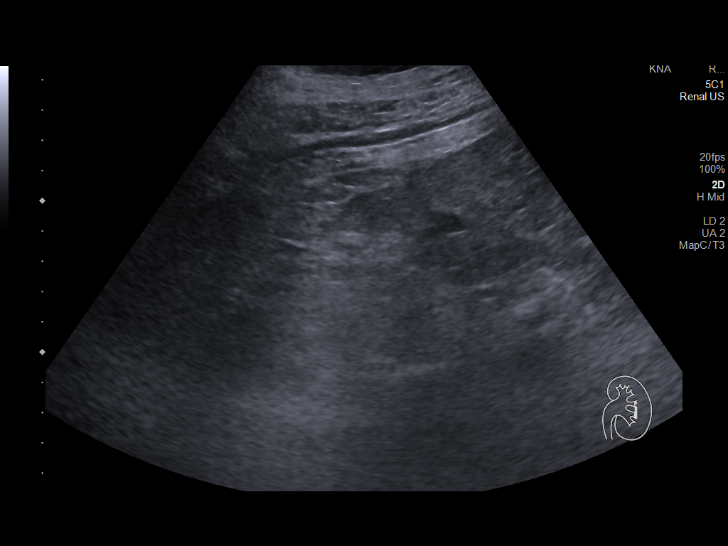
[im 71/95]
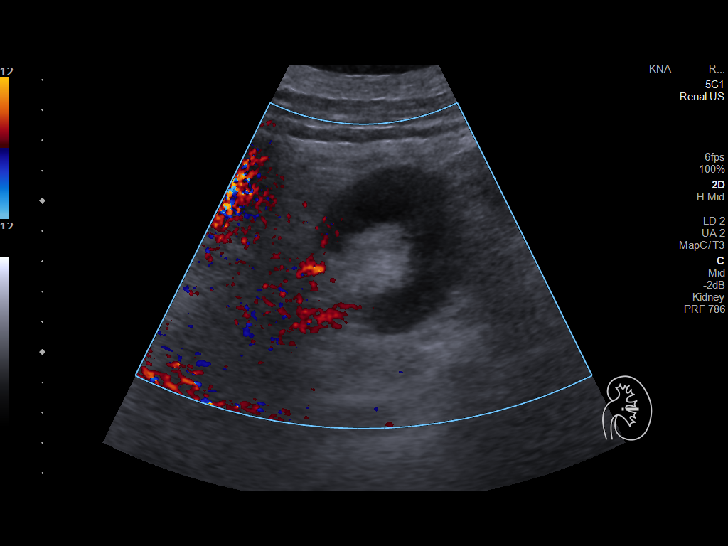
[im 79/95]
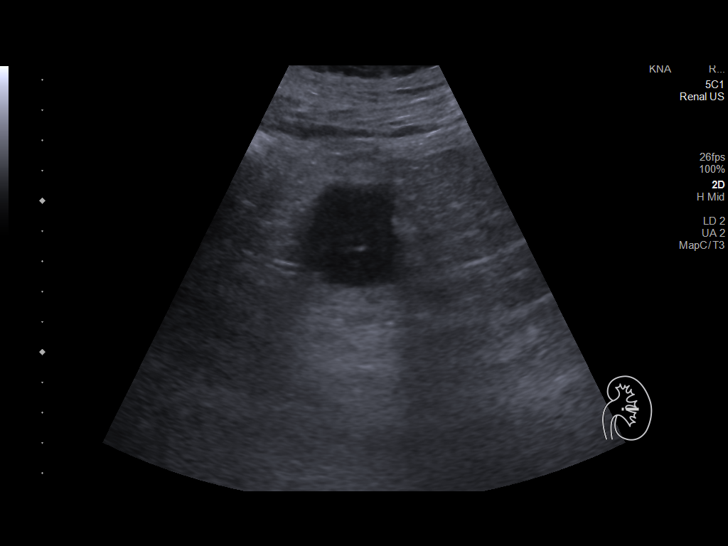
[im 87/95]
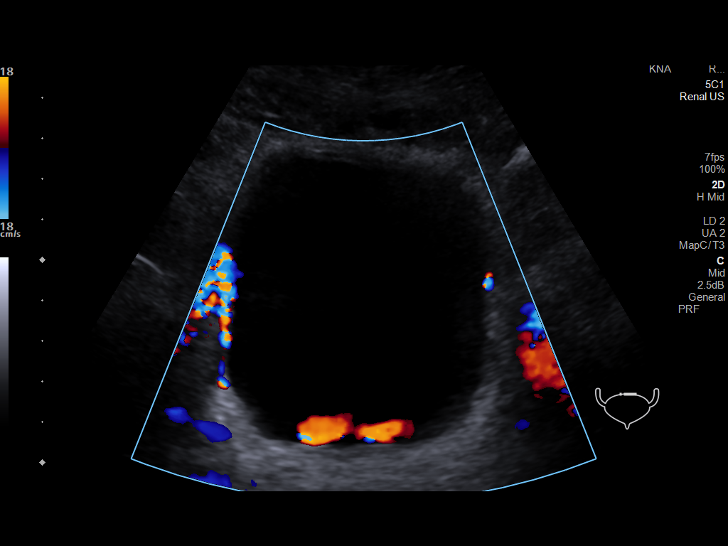
[im 95/95]
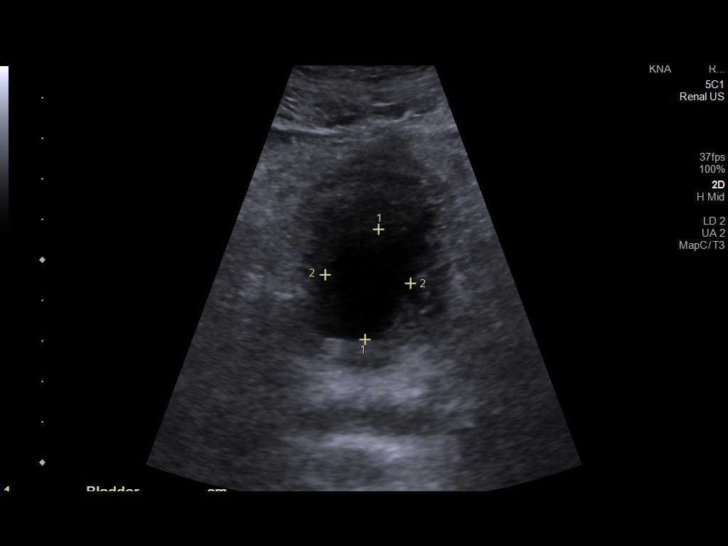

[14 of 25 positions shown; findings below may reference images not displayed]

FINDINGS: Right Kidney:

Renal measurements: 11.6 x 5.7 x 6.6 cm = volume: 228 mL.
Echogenicity within normal limits. No mass or hydronephrosis
visualized.

Left Kidney:

Renal measurements: 10.5 x 5.4 x 5.0 cm = volume: 148 mL.
Echogenicity within normal limits. No mass or hydronephrosis
visualized.

Bladder:

Appears normal for degree of bladder distention.

Other:

Right pleural effusion.  Trace perihepatic ascites.
IMPRESSION: 1. The kidneys and bladder are unremarkable.
2. Right pleural effusion.
3. Trace perihepatic ascites.

## 2022-09-26 ENCOUNTER — Other Ambulatory Visit: Payer: Self-pay | Admitting: Family Medicine

## 2022-09-26 DIAGNOSIS — G2581 Restless legs syndrome: Secondary | ICD-10-CM

## 2022-10-09 DIAGNOSIS — M79674 Pain in right toe(s): Secondary | ICD-10-CM | POA: Diagnosis not present

## 2022-10-09 DIAGNOSIS — L600 Ingrowing nail: Secondary | ICD-10-CM | POA: Diagnosis not present

## 2022-10-22 ENCOUNTER — Ambulatory Visit: Payer: Medicare HMO | Admitting: Family Medicine

## 2022-10-23 DIAGNOSIS — Z4889 Encounter for other specified surgical aftercare: Secondary | ICD-10-CM | POA: Diagnosis not present

## 2022-10-25 ENCOUNTER — Ambulatory Visit (INDEPENDENT_AMBULATORY_CARE_PROVIDER_SITE_OTHER): Payer: Medicare HMO | Admitting: Family Medicine

## 2022-10-25 ENCOUNTER — Other Ambulatory Visit: Payer: Self-pay | Admitting: *Deleted

## 2022-10-25 ENCOUNTER — Encounter: Payer: Self-pay | Admitting: Family Medicine

## 2022-10-25 VITALS — BP 110/72 | HR 62 | Temp 97.5°F | Ht 66.0 in | Wt 201.0 lb

## 2022-10-25 DIAGNOSIS — I350 Nonrheumatic aortic (valve) stenosis: Secondary | ICD-10-CM | POA: Diagnosis not present

## 2022-10-25 DIAGNOSIS — Z794 Long term (current) use of insulin: Secondary | ICD-10-CM | POA: Diagnosis not present

## 2022-10-25 DIAGNOSIS — L989 Disorder of the skin and subcutaneous tissue, unspecified: Secondary | ICD-10-CM | POA: Diagnosis not present

## 2022-10-25 DIAGNOSIS — I5032 Chronic diastolic (congestive) heart failure: Secondary | ICD-10-CM

## 2022-10-25 DIAGNOSIS — K7031 Alcoholic cirrhosis of liver with ascites: Secondary | ICD-10-CM

## 2022-10-25 DIAGNOSIS — E785 Hyperlipidemia, unspecified: Secondary | ICD-10-CM

## 2022-10-25 DIAGNOSIS — E1169 Type 2 diabetes mellitus with other specified complication: Secondary | ICD-10-CM

## 2022-10-25 DIAGNOSIS — N1832 Chronic kidney disease, stage 3b: Secondary | ICD-10-CM | POA: Diagnosis not present

## 2022-10-25 DIAGNOSIS — D631 Anemia in chronic kidney disease: Secondary | ICD-10-CM

## 2022-10-25 DIAGNOSIS — E119 Type 2 diabetes mellitus without complications: Secondary | ICD-10-CM

## 2022-10-25 DIAGNOSIS — D696 Thrombocytopenia, unspecified: Secondary | ICD-10-CM | POA: Diagnosis not present

## 2022-10-25 NOTE — Progress Notes (Signed)
   Subjective:    Patient ID: Austin Morgan, male    DOB: 01-19-1956, 67 y.o.   MRN: [id number]  HPI  3 month follow up  Patient with history of cirrhosis Being evaluated for possible transplant Currently not to be done He is treating anything medically He also has diabetes he has been monitoring his sugars and try to keep the midline He does do Dexcom and monitors it closely Denies any high sugars currently Review of Systems     Objective:   Physical Exam  General-in no acute distress Eyes-no discharge Lungs-respiratory rate normal, CTA CV-no murmurs,RRR Extremities skin warm dry no edema Neuro grossly normal Behavior normal, alert  Has skin lesions on leg     Assessment & Plan:  1. Skin lesion He has several small skin lesions 1 on leg and one higher up on the leg we have treated this previously look at one of them given that he has more I recommend dermatology consult - Ambulatory referral to Dermatology  2. Nonrheumatic aortic valve stenosis Previous ECHO showed aortic stenosis repeat echo ordered placed  3. Anemia in stage 3b chronic kidney disease (HCC) Will look at CBC monitor platelets as well  4. Chronic diastolic (congestive) heart failure (HCC) No active failure currently healthy diet minimize salt  5. Thrombocytopenia (HCC) Check CBC  6. Hyperlipidemia associated with type 2 diabetes mellitus (HCC) Check lipid profile liver profile  7. Stage 3b chronic kidney disease (CKD) (HCC) Metabolic 7 urine ACR continue current measures  8. Diabetes mellitus without complication (HCC) Continue current measures notify us of any low sugars check A1c  Because of cirrhosis check PT/INR

## 2022-10-26 ENCOUNTER — Encounter: Payer: Self-pay | Admitting: Internal Medicine

## 2022-10-26 ENCOUNTER — Telehealth: Payer: Self-pay | Admitting: Family Medicine

## 2022-10-26 DIAGNOSIS — E119 Type 2 diabetes mellitus without complications: Secondary | ICD-10-CM

## 2022-10-26 DIAGNOSIS — K7469 Other cirrhosis of liver: Secondary | ICD-10-CM

## 2022-10-26 NOTE — Telephone Encounter (Signed)
Nurses Nonurgent  Please order the following tests A1c, urine ACR, CMP, PT/INR Diagnosis-diabetes, cirrhosis,K74.69  Send patient MyChart message that these have been ordered and he can do these at his convenience within the next 2 weeks  (For future information-we will fax these to the nurse practitioner [facility] that works with liver care and transplant team Annamarie Major NP [phone])

## 2022-11-02 ENCOUNTER — Encounter: Payer: Self-pay | Admitting: *Deleted

## 2022-11-02 NOTE — Telephone Encounter (Signed)
Blood work ordered in EPIC. Patient notified via my chart 

## 2022-11-07 DIAGNOSIS — R809 Proteinuria, unspecified: Secondary | ICD-10-CM | POA: Diagnosis not present

## 2022-11-07 DIAGNOSIS — E1129 Type 2 diabetes mellitus with other diabetic kidney complication: Secondary | ICD-10-CM | POA: Diagnosis not present

## 2022-11-07 DIAGNOSIS — E1122 Type 2 diabetes mellitus with diabetic chronic kidney disease: Secondary | ICD-10-CM | POA: Diagnosis not present

## 2022-11-07 DIAGNOSIS — K7030 Alcoholic cirrhosis of liver without ascites: Secondary | ICD-10-CM | POA: Diagnosis not present

## 2022-11-12 DIAGNOSIS — E1169 Type 2 diabetes mellitus with other specified complication: Secondary | ICD-10-CM | POA: Diagnosis not present

## 2022-11-15 ENCOUNTER — Telehealth: Payer: Self-pay | Admitting: Family Medicine

## 2022-11-15 NOTE — Telephone Encounter (Signed)
Patient has a ECHO scheduled at [facility] 12/31/22 1pm - Patient notified by phone 11/15/22

## 2022-11-19 DIAGNOSIS — E119 Type 2 diabetes mellitus without complications: Secondary | ICD-10-CM | POA: Diagnosis not present

## 2022-11-23 ENCOUNTER — Inpatient Hospital Stay: Payer: Medicare HMO | Attending: Hematology

## 2022-11-23 DIAGNOSIS — D72819 Decreased white blood cell count, unspecified: Secondary | ICD-10-CM | POA: Insufficient documentation

## 2022-11-23 DIAGNOSIS — D696 Thrombocytopenia, unspecified: Secondary | ICD-10-CM | POA: Diagnosis not present

## 2022-11-23 DIAGNOSIS — D509 Iron deficiency anemia, unspecified: Secondary | ICD-10-CM | POA: Diagnosis not present

## 2022-11-23 DIAGNOSIS — I130 Hypertensive heart and chronic kidney disease with heart failure and stage 1 through stage 4 chronic kidney disease, or unspecified chronic kidney disease: Secondary | ICD-10-CM | POA: Diagnosis not present

## 2022-11-23 DIAGNOSIS — D631 Anemia in chronic kidney disease: Secondary | ICD-10-CM | POA: Insufficient documentation

## 2022-11-23 DIAGNOSIS — I5032 Chronic diastolic (congestive) heart failure: Secondary | ICD-10-CM | POA: Diagnosis not present

## 2022-11-23 DIAGNOSIS — N184 Chronic kidney disease, stage 4 (severe): Secondary | ICD-10-CM | POA: Insufficient documentation

## 2022-11-23 DIAGNOSIS — Z87891 Personal history of nicotine dependence: Secondary | ICD-10-CM | POA: Diagnosis not present

## 2022-11-23 DIAGNOSIS — D500 Iron deficiency anemia secondary to blood loss (chronic): Secondary | ICD-10-CM

## 2022-11-23 DIAGNOSIS — R161 Splenomegaly, not elsewhere classified: Secondary | ICD-10-CM

## 2022-11-23 DIAGNOSIS — D61818 Other pancytopenia: Secondary | ICD-10-CM

## 2022-11-23 LAB — COMPREHENSIVE METABOLIC PANEL
ALT: 20 U/L (ref 0–44)
AST: 26 U/L (ref 15–41)
Albumin: 3.9 g/dL (ref 3.5–5.0)
Alkaline Phosphatase: 107 U/L (ref 38–126)
Anion gap: 11 (ref 5–15)
BUN: 48 mg/dL — ABNORMAL HIGH (ref 8–23)
CO2: 26 mmol/L (ref 22–32)
Calcium: 9.2 mg/dL (ref 8.9–10.3)
Chloride: 100 mmol/L (ref 98–111)
Creatinine, Ser: 2.46 mg/dL — ABNORMAL HIGH (ref 0.61–1.24)
GFR, Estimated: 28 mL/min — ABNORMAL LOW (ref >60)
Glucose, Bld: 184 mg/dL — ABNORMAL HIGH (ref 70–99)
Potassium: 3.6 mmol/L (ref 3.5–5.1)
Sodium: 137 mmol/L (ref 135–145)
Total Bilirubin: 1.4 mg/dL — ABNORMAL HIGH (ref 0.3–1.2)
Total Protein: 7.4 g/dL (ref 6.5–8.1)

## 2022-11-23 LAB — CBC WITH DIFFERENTIAL/PLATELET
Abs Immature Granulocytes: 0.02 10*3/uL (ref 0.00–0.07)
Basophils Absolute: 0 10*3/uL (ref 0.0–0.1)
Basophils Relative: 1 %
Eosinophils Absolute: 0.4 10*3/uL (ref 0.0–0.5)
Eosinophils Relative: 6 %
HCT: 33.4 % — ABNORMAL LOW (ref 39.0–52.0)
Hemoglobin: 12 g/dL — ABNORMAL LOW (ref 13.0–17.0)
Immature Granulocytes: 0 %
Lymphocytes Relative: 21 %
Lymphs Abs: 1.2 10*3/uL (ref 0.7–4.0)
MCH: 33.7 pg (ref 26.0–34.0)
MCHC: 35.9 g/dL (ref 30.0–36.0)
MCV: 93.8 fL (ref 80.0–100.0)
Monocytes Absolute: 0.6 10*3/uL (ref 0.1–1.0)
Monocytes Relative: 10 %
Neutro Abs: 3.4 10*3/uL (ref 1.7–7.7)
Neutrophils Relative %: 62 %
Platelets: 103 10*3/uL — ABNORMAL LOW (ref 150–400)
RBC: 3.56 MIL/uL — ABNORMAL LOW (ref 4.22–5.81)
RDW: 12.3 % (ref 11.5–15.5)
WBC: 5.5 10*3/uL (ref 4.0–10.5)
nRBC: 0 % (ref 0.0–0.2)

## 2022-11-23 LAB — VITAMIN B12: Vitamin B-12: 1133 pg/mL — ABNORMAL HIGH (ref 180–914)

## 2022-11-23 LAB — IRON AND TIBC
Iron: 94 ug/dL (ref 45–182)
Saturation Ratios: 30 % (ref 17.9–39.5)
TIBC: 316 ug/dL (ref 250–450)
UIBC: 222 ug/dL

## 2022-11-23 LAB — FERRITIN: Ferritin: 88 ng/mL (ref 24–336)

## 2022-11-28 LAB — METHYLMALONIC ACID, SERUM: Methylmalonic Acid, Quantitative: 427 nmol/L — ABNORMAL HIGH (ref 0–378)

## 2022-11-29 NOTE — Progress Notes (Signed)
[facility] [address], [place] [address]   CLINIC:  Medical Oncology/Hematology  PCP:  Babs Sciara, MD [address] [place] [address] [phone]   REASON FOR VISIT:  Follow-up for normocytic anemia (iron deficiency + CKD), thrombocytopenia, and leukopenia   CURRENT THERAPY: Intermittent iron infusions  INTERVAL HISTORY:   Austin Morgan 67 y.o. male returns for routine follow-up of his iron deficiency anemia, anemia of CKD, thrombocytopenia, and leukopenia.  He was last seen by Rojelio Brenner PA-C on 07/27/2022.   At today's visit, he reports feeling fair.   He has not had any hospitalizations, surgeries, or changes in his baseline health status.   He reports that his energy is improving.  He continues to report ice pica (decreased) and restless legs.  He denies any headaches, chest pain, dyspnea on exertion, palpitations, lightheadedness, or syncope.   Patient denies any hematemesis, melena, or hematochezia since his last visit.   He has occasional nosebleeds, but none recently.   He admits to easy bruising but denies petechial rash.  No frequent infections or B symptoms such as fever, chills, night sweats, unintentional weight loss.  He denies any new bone pain or neurologic changes.  He is taking daily iron supplement/vitamin C, takes it at separate times in his Protonix.   He is taking vitamin B12 three times weekly.  He has 75% energy and 100% appetite. He endorses that he is maintaining a stable weight.  ASSESSMENT & PLAN:  1.  Normocytic anemia secondary to iron deficiency and CKD stage IIIb/IV -  Normocytic anemia since 2021, with significant worsening of his anemia in April 2023 (Hgb 5.6) during admission to hospital for GI bleeding.   - Hematology work-up (12/20/2021): Reticulocytes 1.5% (hypoproliferative in the setting of moderate anemia) LDH normal. Ferritin 31, iron saturation 21%. Normal folate, copper. Normal B12 781  with mildly elevated methylmalonic acid 411. SPEP negative.  Immunofixation shows polyclonal gammopathy. LDH normal.   - Hospitalized in April 2023 for GI bleeding secondary to varices, requiring esophageal variceal banding and PRBC transfusion x 3.  He was hospitalized again in May 2023 with volume overload and symptomatic anemia requiring repeat EGD (10/31/2021) which showed portal gastropathy and grade 1 varices - he received Venofer 300 mg during hospitalization in May. - Follows with [facility] Tana Coast, PA-C and Dr. Jena Gauss) for his alcohol-related liver cirrhosis and GI bleeding. - He has been taking oral iron supplementation since June 2023 - He is taking vitamin B12 500 mcg 3 times weekly - Most recent IV Venofer 300 mg x 3 from 02/27/2022 through 03/13/2022 - Receiving Procrit via Dr. Wolfgang Phoenix, but has not required injection since 02/22/2022 due to Hgb >10 - Denies any recent hematemesis, melena, or hematochezia. - Symptomatic with fatigue, pica, restless legs - Most recent labs (11/23/2022): Hgb 12.0/MCV 93.8, ferritin 88, iron saturation 30%.  Baseline CKD stage IIIb/IV with creatinine 2.46/GFR 28.  Vitamin B12 1133, MMA is elevated at 427. - Differential diagnosis favors iron deficiency anemia secondary to GI blood loss as well as anemia secondary to his CKD stage IIIb - PLAN: Recommend IV Venofer 300 mg x 1 dose to keep ferritin > 100 in the setting of high bleeding risk, liver cirrhosis, and CKD - Patient can continue to take oral iron supplementation, recommended to take this at least 4 hours separate from his Protonix  - Continue taking vitamin B12 500 mcg 3 times weekly (due to elevated MMA  as above)  - Procrit as per nephrologist - Labs in 4 months with office visit 1 week after labs   2.  Thrombocytopenia and leukopenia (cirrhosis and splenomegaly) - Intermittent mild leukopenia since April 2023.  He has had mild thrombocytopenia since June 2022, with  most recent platelets (12/13/2021) at 35.   - Nephrology testing for hepatitis B, hepatitis C, and HIV in June 2023 was negative - Hematology work-up (12/20/2021): Reticulocytes 1.5% (hypoproliferative in the setting of moderate anemia) Normal folate, copper. Normal B12 781 with mildly elevated methylmalonic acid 411. SPEP negative for M spike.  Immunofixation shows polyclonal gammopathy.  LDH normal. - No history of autoimmune or connective tissue disorder. - Patient has liver cirrhosis and splenomegaly - Follows with [facility] Tana Coast, PA-C and Dr. Jena Gauss) for his alcohol-related liver cirrhosis and GI bleeding. - Most recent abdominal ultrasound (12/07/2020) showed splenomegaly measuring 14 cm with volume 678 cc. - Admits to easy bruising, no petechial rash.  Occasional mild epistaxis. - Most recent labs (11/23/2022): Platelets 103.  Normal WBC and differential.  Vitamin B12 1133, MMA is elevated at 427 - PLAN: Continue taking vitamin B12 500 mcg 3 times weekly (due to elevated MMA as above)  - No indication for treatment of leukopenia or thrombocytopenia at this time.  We will continue to monitor.   3.  Elevated free light chains - Labs obtained by nephrologist (11/15/2021) show elevated free serum light chains with elevated kappa 168.3, elevated lambda 92.6, and elevated ratio 1.82. - 24-hour urine (11/15/2021) showed elevated urine light chains with kappa 96.75 and lambda 9.02 with ratio 10.73.  Urine immunofixation showed polyclonal light chains but was negative for monoclonal immunoglobulin or Bence-Jones protein. - Hematology work-up (12/20/2021):  SPEP negative for M spike.  Immunofixation shows polyclonal gammopathy.  Elevated kappa light chains 138.0, elevated lambda 78.5, with elevated ratio 1.76 (in keeping with CKD) - He denies any new bone pain or B symptoms.   - PLAN: Elevated light chains secondary to CKD.  No further work-up of plasma cell dyscrasias at  this time.   4.  Other history - PMH: CKD stage 3b, type 2 diabetes mellitus, anemia of chronic disease, hypertension, alcoholic liver cirrhosis, history of GI bleed from esophageal varices, restless legs, sleep apnea, and chronic diastolic CHF. 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 - SUBSTANCE: History of heavy alcohol use for 20 years, but has been sober since October 2022.  He smoked 0.5 PPD cigarettes for 30 years, but quit in 2017.  He has history of drug use many years ago, but denies any IV drug use. - FAMILY: No family history of blood abnormalities or cancer.   PLAN SUMMARY: ** Needs THURSDAY MORNING APPOINTMENTS only ** >> IV Venofer 300 mg x 1 >> Labs in 4 months = CMC/D, CMP, ferritin, iron/TIBC, B12, MMA >> OFFICE visit in 4 months (1 week after labs)     REVIEW OF SYSTEMS:  Review of Systems  Constitutional:  Positive for fatigue (improved). Negative for appetite change, chills, diaphoresis, fever and unexpected weight change.  HENT:   Negative for lump/mass and nosebleeds.   Eyes:  Negative for eye problems.  Respiratory:  Negative for cough, hemoptysis and shortness of breath.   Cardiovascular:  Negative for chest pain, leg swelling and palpitations.  Gastrointestinal:  Negative for abdominal pain, blood in stool, constipation, diarrhea, nausea and vomiting.  Genitourinary:  Negative  for hematuria.   Musculoskeletal:  Positive for arthralgias (left shoulder).  Skin: Negative.   Neurological:  Positive for numbness (fingers). Negative for dizziness, headaches and light-headedness.  Hematological:  Does not bruise/bleed easily.     PHYSICAL EXAM:  ECOG PERFORMANCE STATUS: 1 - Symptomatic but completely ambulatory  There were no vitals filed for this visit.  There were no vitals filed for this visit.  Physical Exam Constitutional:      Appearance: Normal appearance. He is  obese.  HENT:     Head: Normocephalic and atraumatic.     Mouth/Throat:     Mouth: Mucous membranes are moist.  Eyes:     Extraocular Movements: Extraocular movements intact.     Pupils: Pupils are equal, round, and reactive to light.  Cardiovascular:     Rate and Rhythm: Normal rate and regular rhythm.     Pulses: Normal pulses.     Heart sounds: Normal heart sounds.  Pulmonary:     Effort: Pulmonary effort is normal.     Breath sounds: Normal breath sounds.  Abdominal:     General: Bowel sounds are normal.     Palpations: Abdomen is soft.     Tenderness: There is no abdominal tenderness.  Musculoskeletal:        General: No swelling.  Lymphadenopathy:     Cervical: No cervical adenopathy.  Skin:    General: Skin is warm and dry.  Neurological:     General: No focal deficit present.     Mental Status: He is alert and oriented to person, place, and time.  Psychiatric:        Mood and Affect: Mood normal.        Behavior: Behavior normal.     PAST MEDICAL/SURGICAL HISTORY:  Past Medical History:  Diagnosis Date   Anxiety    Arthritis    Depression    Diabetes mellitus without complication (HCC)    ED (erectile dysfunction)    Gout    H/O ETOH abuse    Hyperlipidemia    Hypertension    Hypertriglyceridemia    Iron deficiency anemia due to chronic blood loss 12/20/2021   PONV (postoperative nausea and vomiting)    Sleep apnea    Past Surgical History:  Procedure Laterality Date   CARPAL TUNNEL RELEASE Bilateral    COLONOSCOPY  2010   Dr. Lina Sar: mild diverticulosis, next colonoscopy 2020   COLONOSCOPY WITH PROPOFOL N/A 01/21/2020   Rourk: 9 polyps removed, multiple tubular adenomas.  Next colonoscopy in 3 years.   ELBOW SURGERY     ESOPHAGEAL BANDING  09/28/2021   Procedure: ESOPHAGEAL BANDING;  Surgeon: Corbin Ade, MD;  [facility];  Service: Endoscopy;;   ESOPHAGOGASTRODUODENOSCOPY (EGD) WITH PROPOFOL N/A 01/21/2020   Rourk: normal    ESOPHAGOGASTRODUODENOSCOPY (EGD) WITH PROPOFOL N/A 09/28/2021   Procedure: ESOPHAGOGASTRODUODENOSCOPY (EGD) WITH PROPOFOL;  Surgeon: Corbin Ade, MD;  [facility];  Service: Endoscopy;  Laterality: N/A;  3:00pm   ESOPHAGOGASTRODUODENOSCOPY (EGD) WITH PROPOFOL N/A 10/31/2021   Procedure: ESOPHAGOGASTRODUODENOSCOPY (EGD) WITH PROPOFOL;  Surgeon: Dolores Frame, MD;  [facility];  Service: Gastroenterology;  Laterality: N/A;   ESOPHAGOGASTRODUODENOSCOPY (EGD) WITH PROPOFOL N/A 05/23/2022   Procedure: ESOPHAGOGASTRODUODENOSCOPY (EGD) WITH PROPOFOL;  Surgeon: Corbin Ade, MD;  [facility];  Service: Endoscopy;  Laterality: N/A;  8:30am, asa 3   NECK SURGERY     c4   POLYPECTOMY  01/21/2020   Procedure: POLYPECTOMY;  Surgeon: Corbin Ade, MD;  [facility]: [facility];  Service: Endoscopy;;  colon    SOCIAL HISTORY:  Social History   Socioeconomic History   Marital status: Married    Spouse name: Not on file   Number of children: Not on file   Years of education: Not on file   Highest education level: Not on file  Occupational History   Not on file  Tobacco Use   Smoking status: Former    Packs/day: 1.00    Years: 36.00    Additional pack years: 0.00    Total pack years: 36.00    Types: Cigarettes    Quit date: 03/11/2016    Years since quitting: 6.7    Passive exposure: Past   Smokeless tobacco: Never  Vaping Use   Vaping Use: Never used  Substance and Sexual Activity   Alcohol use: Not Currently    Comment: drinks a fifth of liquor per week; inpatient rehab 08/2019. denied 10/14/19. No etoh since 03/2021 (as of 09/22/21)   Drug use: No   Sexual activity: Yes  Other Topics Concern   Not on file  Social History Narrative   Wife, Austin Larrabee NP [facility]   Social Determinants of Health   Financial Resource Strain: Low Risk  (08/24/2022)   Overall Financial Resource Strain (CARDIA)    Difficulty of  Paying Living Expenses: Not hard at all  Food Insecurity: No Food Insecurity (08/24/2022)   Hunger Vital Sign    Worried About Running Out of Food in the Last Year: Never true    Ran Out of Food in the Last Year: Never true  Transportation Needs: No Transportation Needs (08/24/2022)   PRAPARE - Administrator, Civil Service (Medical): No    Lack of Transportation (Non-Medical): No  Physical Activity: Inactive (08/24/2022)   Exercise Vital Sign    Days of Exercise per Week: 0 days    Minutes of Exercise per Session: 0 min  Stress: No Stress Concern Present (08/24/2022)   [employer] of Occupational Health - Occupational Stress Questionnaire    Feeling of Stress : Not at all  Social Connections: Socially Integrated (08/24/2022)   Social Connection and Isolation Panel [NHANES]    Frequency of Communication with Friends and Family: More than three times a week    Frequency of Social Gatherings with Friends and Family: More than three times a week    Attends Religious Services: More than 4 times per year    Active Member of [employer] or Organizations: Yes    Attends Engineer, structural: More than 4 times per year    Marital Status: Married  Catering manager Violence: Not At Risk (08/24/2022)   Humiliation, Afraid, Rape, and Kick questionnaire    Fear of Current or Ex-Partner: No    Emotionally Abused: No    Physically Abused: No    Sexually Abused: No    FAMILY HISTORY:  Family History  Problem Relation Age of Onset   Heart attack Father    Heart Problems Brother        Had open heart surgery   Colon cancer Neg Hx     CURRENT MEDICATIONS:  Outpatient Encounter Medications as of 11/30/2022  Medication Sig   albuterol (VENTOLIN HFA) 108 (90 Base) MCG/ACT inhaler INHALE 2 PUFFS BY MOUTH EVERY 6 HOURS AS NEEDED FOR WHEEZING   BD PEN NEEDLE NANO 2ND GEN 32G X 4 MM MISC USE TO ADMINISTER INSULIN  Continuous Blood Gluc Receiver (DEXCOM G6 RECEIVER) DEVI 1 Device by  Does not apply route continuous.   Continuous Blood Gluc Sensor (DEXCOM G6 SENSOR) MISC 1 Units by Does not apply route continuous. (Patient taking differently: 1 Units by Does not apply route continuous. G7)   cyanocobalamin (VITAMIN B12) 500 MCG tablet Take 500 mcg by mouth every Monday, Wednesday, and Friday.   diclofenac Sodium (VOLTAREN) 1 % GEL Apply 2 g topically in the morning, at noon, and at bedtime.   ferrous sulfate 325 (65 FE) MG EC tablet Take 325 mg by mouth every morning.   furosemide (LASIX) 40 MG tablet Take 1 tablet (40 mg total) by mouth 2 (two) times daily.   insulin aspart (NOVOLOG FLEXPEN) 100 UNIT/ML FlexPen Inject 20 units in the AM, Inject 10 units at lunch, and inject 20 units at supper   insulin glargine (LANTUS SOLOSTAR) 100 UNIT/ML Solostar Pen Inject 66 units subcutaneously every evening may titrate up to 80 units   lactulose (CHRONULAC) 10 GM/15ML solution Take 30 mLs (20 g total) by mouth in the morning, at noon, in the evening, and at bedtime.   magnesium oxide (MAG-OX) 400 (240 Mg) MG tablet Take 400 mg by mouth daily.   metolazone (ZAROXOLYN) 5 MG tablet Take 5 mg by mouth every Monday, Wednesday, and Friday.   Multiple Vitamins-Minerals (MULTIVITAMIN WITH MINERALS) tablet Take 1 tablet by mouth daily.   nadolol (CORGARD) 40 MG tablet Take 1/2 (one-half) tablet by mouth once daily (Patient taking differently: Take 20 mg by mouth at bedtime.)   ondansetron (ZOFRAN) 8 MG tablet TAKE 1 TABLET BY MOUTH THREE TIMES DAILY AS NEEDED FOR NAUSEA   pantoprazole (PROTONIX) 40 MG tablet Take 1 tablet (40 mg total) by mouth 2 (two) times daily. (Patient taking differently: Take 40 mg by mouth daily as needed (acid reflux).)   pramipexole (MIRAPEX) 0.25 MG tablet TAKE 1 TABLET BY MOUTH AT BEDTIME   rifaximin (XIFAXAN) 550 MG TABS tablet Take 550 mg by mouth 2 (two) times daily.   spironolactone (ALDACTONE) 25 MG tablet Take 25 mg by mouth every Monday, Wednesday, and Friday.    tadalafil (CIALIS) 20 MG tablet Take 0.5-1 tablets (10-20 mg total) by mouth every other day as needed for erectile dysfunction.   traZODone (DESYREL) 100 MG tablet One and one half qhs   triamcinolone cream (KENALOG) 0.1 % Apply thin amount bid prn   No facility-administered encounter medications on file as of 11/30/2022.    ALLERGIES:  Allergies  Allergen Reactions   Codeine Nausea And Vomiting    LABORATORY DATA:  I have reviewed the labs as listed.  CBC    Component Value Date/Time   WBC 5.5 11/23/2022 1223   RBC 3.56 (L) 11/23/2022 1223   HGB 12.0 (L) 11/23/2022 1223   HGB 9.1 (L) 03/02/2022 1158   HCT 33.4 (L) 11/23/2022 1223   HCT 26.8 (L) 03/02/2022 1158   PLT 103 (L) 11/23/2022 1223   PLT 128 (L) 03/02/2022 1158   MCV 93.8 11/23/2022 1223   MCV 91 03/02/2022 1158   MCH 33.7 11/23/2022 1223   MCHC 35.9 11/23/2022 1223   RDW 12.3 11/23/2022 1223   RDW 12.2 03/02/2022 1158   LYMPHSABS 1.2 11/23/2022 1223   LYMPHSABS 1.1 03/02/2022 1158   MONOABS 0.6 11/23/2022 1223   EOSABS 0.4 11/23/2022 1223   EOSABS 0.4 03/02/2022 1158   BASOSABS 0.0 11/23/2022 1223   BASOSABS 0.0 03/02/2022 1158      Latest  Ref Rng & Units 11/23/2022   12:23 PM 07/20/2022   10:53 AM 05/22/2022    1:52 PM  CMP  Glucose 70 - 99 mg/dL 956  213  086   BUN 8 - 23 mg/dL 48  52  60   Creatinine 0.61 - 1.24 mg/dL 5.78  4.69  6.29   Sodium 135 - 145 mmol/L 137  136  138   Potassium 3.5 - 5.1 mmol/L 3.6  3.8  4.5   Chloride 98 - 111 mmol/L 100  96  102   CO2 22 - 32 mmol/L 26  27  27    Calcium 8.9 - 10.3 mg/dL 9.2  9.5  52.8    41.3   Total Protein 6.5 - 8.1 g/dL 7.4  8.2    Total Bilirubin 0.3 - 1.2 mg/dL 1.4  0.9    Alkaline Phos 38 - 126 U/L 107  167    AST 15 - 41 U/L 26  38    ALT 0 - 44 U/L 20  48      DIAGNOSTIC IMAGING:  I have independently reviewed the relevant imaging and discussed with the patient.   WRAP UP:  All questions were answered. The patient knows to call the clinic  with any problems, questions or concerns.  Medical decision making: Moderate  Time spent on visit: I spent 20 minutes counseling the patient face to face. The total time spent in the appointment was 30 minutes and more than 50% was on counseling.  Carnella Guadalajara, PA-C  11/30/22 9:26 AM

## 2022-11-30 ENCOUNTER — Inpatient Hospital Stay (HOSPITAL_BASED_OUTPATIENT_CLINIC_OR_DEPARTMENT_OTHER): Payer: Medicare HMO | Admitting: Physician Assistant

## 2022-11-30 VITALS — BP 123/84 | HR 55 | Temp 98.2°F | Resp 18 | Ht 66.0 in | Wt 199.0 lb

## 2022-11-30 DIAGNOSIS — D500 Iron deficiency anemia secondary to blood loss (chronic): Secondary | ICD-10-CM

## 2022-11-30 DIAGNOSIS — D509 Iron deficiency anemia, unspecified: Secondary | ICD-10-CM | POA: Diagnosis not present

## 2022-11-30 DIAGNOSIS — D61818 Other pancytopenia: Secondary | ICD-10-CM | POA: Diagnosis not present

## 2022-11-30 DIAGNOSIS — D631 Anemia in chronic kidney disease: Secondary | ICD-10-CM

## 2022-11-30 DIAGNOSIS — N1832 Chronic kidney disease, stage 3b: Secondary | ICD-10-CM

## 2022-11-30 DIAGNOSIS — I130 Hypertensive heart and chronic kidney disease with heart failure and stage 1 through stage 4 chronic kidney disease, or unspecified chronic kidney disease: Secondary | ICD-10-CM | POA: Diagnosis not present

## 2022-11-30 DIAGNOSIS — D696 Thrombocytopenia, unspecified: Secondary | ICD-10-CM

## 2022-11-30 DIAGNOSIS — Z87891 Personal history of nicotine dependence: Secondary | ICD-10-CM | POA: Diagnosis not present

## 2022-11-30 DIAGNOSIS — I5032 Chronic diastolic (congestive) heart failure: Secondary | ICD-10-CM | POA: Diagnosis not present

## 2022-11-30 DIAGNOSIS — D72819 Decreased white blood cell count, unspecified: Secondary | ICD-10-CM | POA: Diagnosis not present

## 2022-11-30 DIAGNOSIS — N184 Chronic kidney disease, stage 4 (severe): Secondary | ICD-10-CM | POA: Diagnosis not present

## 2022-11-30 NOTE — Patient Instructions (Signed)
[facility] at [facility] **VISIT SUMMARY & IMPORTANT INSTRUCTIONS **   You were seen today by Rojelio Brenner PA-C for your follow-up visit.    ANEMIA: Your anemia is due to your iron deficiency and your chronic kidney disease. We will schedule you for IV iron x 1 dose Continue to take your iron tablet at home daily. Dr. Wolfgang Phoenix has started you on Procrit injections.  Continue these injections per his instructions.  LOW PLATELETS: Your low platelets are most likely due to your cirrhosis. Your labs did show mild abnormality of your B12 labs (elevated methylmalonic acid). You should continue taking low-dose vitamin B12 500 mcg 3 days weekly (Monday/Wednesday/Friday)  FOLLOW-UP APPOINTMENT: We will check labs in 4 months and see you for an office visit 1 week after labs.  ** Thank you for trusting me with your healthcare!  I strive to provide all of my patients with quality care at each visit.  If you receive a survey for this visit, I would be so grateful to you for taking the time to provide feedback.  Thank you in advance!  ~ Dalayna Lauter                   Dr. Doreatha Massed   &   Rojelio Brenner, PA-C   - - - - - - - - - - - - - - - - - -    Thank you for choosing [facility] at [facility] to provide your oncology and hematology care.  To afford each patient quality time with our provider, please arrive at least 15 minutes before your scheduled appointment time.   If you have a lab appointment with the Cancer Center please come in thru the Main Entrance and check in at the main information desk.  You need to re-schedule your appointment should you arrive 10 or more minutes late.  We strive to give you quality time with our providers, and arriving late affects you and other patients whose appointments are after yours.  Also, if you no show three or more times for appointments you may be dismissed from the clinic at the providers  discretion.     Again, thank you for choosing [facility].  Our hope is that these requests will decrease the amount of time that you wait before being seen by our physicians.       _____________________________________________________________  Should you have questions after your visit to [facility], please contact our office at [phone] and follow the prompts.  Our office hours are 8:00 a.m. and 4:30 p.m. Monday - Friday.  Please note that voicemails left after 4:00 p.m. may not be returned until the following business day.  We are closed weekends and major holidays.  You do have access to a nurse 24-7, just call the main number to the clinic [phone] and do not press any options, hold on the line and a nurse will answer the phone.    For prescription refill requests, have your pharmacy contact our office and allow 72 hours.

## 2022-12-03 ENCOUNTER — Telehealth: Payer: Self-pay | Admitting: Family Medicine

## 2022-12-03 DIAGNOSIS — L570 Actinic keratosis: Secondary | ICD-10-CM | POA: Diagnosis not present

## 2022-12-03 DIAGNOSIS — X32XXXA Exposure to sunlight, initial encounter: Secondary | ICD-10-CM | POA: Diagnosis not present

## 2022-12-03 DIAGNOSIS — L308 Other specified dermatitis: Secondary | ICD-10-CM | POA: Diagnosis not present

## 2022-12-03 NOTE — Telephone Encounter (Signed)
FYI- Patient is canceling Echo cardio at this time. Will discuss with you later.

## 2022-12-03 NOTE — Telephone Encounter (Signed)
So noted thank you 

## 2022-12-06 ENCOUNTER — Inpatient Hospital Stay: Payer: Medicare HMO

## 2022-12-06 VITALS — BP 105/73 | HR 66 | Temp 98.2°F | Resp 18

## 2022-12-06 DIAGNOSIS — D500 Iron deficiency anemia secondary to blood loss (chronic): Secondary | ICD-10-CM

## 2022-12-06 DIAGNOSIS — D72819 Decreased white blood cell count, unspecified: Secondary | ICD-10-CM | POA: Diagnosis not present

## 2022-12-06 DIAGNOSIS — Z87891 Personal history of nicotine dependence: Secondary | ICD-10-CM | POA: Diagnosis not present

## 2022-12-06 DIAGNOSIS — D509 Iron deficiency anemia, unspecified: Secondary | ICD-10-CM | POA: Diagnosis not present

## 2022-12-06 DIAGNOSIS — I130 Hypertensive heart and chronic kidney disease with heart failure and stage 1 through stage 4 chronic kidney disease, or unspecified chronic kidney disease: Secondary | ICD-10-CM | POA: Diagnosis not present

## 2022-12-06 DIAGNOSIS — N184 Chronic kidney disease, stage 4 (severe): Secondary | ICD-10-CM | POA: Diagnosis not present

## 2022-12-06 DIAGNOSIS — D696 Thrombocytopenia, unspecified: Secondary | ICD-10-CM | POA: Diagnosis not present

## 2022-12-06 DIAGNOSIS — D631 Anemia in chronic kidney disease: Secondary | ICD-10-CM | POA: Diagnosis not present

## 2022-12-06 DIAGNOSIS — I5032 Chronic diastolic (congestive) heart failure: Secondary | ICD-10-CM | POA: Diagnosis not present

## 2022-12-06 MED ORDER — SODIUM CHLORIDE 0.9 % IV SOLN
300.0000 mg | Freq: Once | INTRAVENOUS | Status: AC
  Administered 2022-12-06: 300 mg via INTRAVENOUS
  Filled 2022-12-06: qty 5

## 2022-12-06 MED ORDER — SODIUM CHLORIDE 0.9 % IV SOLN: Freq: Once | INTRAVENOUS | Status: AC

## 2022-12-06 NOTE — Progress Notes (Signed)
Patient presents today for iron infusion. Patient is in satisfactory condition with no new complaints voiced.  Vital signs are stable.  We will proceed with infusion per provider orders.    Pt took Zyrtec at home prior to  arrival. Peripheral IV started with good blood return pre and post infusion.  Venofer 300 mg given today per MD orders. Tolerated infusion without adverse affects. Vital signs stable. No complaints at this time. Discharged from clinic ambulatory in stable condition. Alert and oriented x 3. F/U with [facility] as scheduled.

## 2022-12-06 NOTE — Patient Instructions (Signed)
[facility]  Discharge Instructions: Thank you for choosing [facility] to provide your oncology and hematology care.  If you have a lab appointment with the Cancer Center - please note that after April 8th, 2024, all labs will be drawn in the cancer center.  You do not have to check in or register with the main entrance as you have in the past but will complete your check-in in the cancer center.  Wear comfortable clothing and clothing appropriate for easy access to any Portacath or PICC line.   We strive to give you quality time with your provider. You may need to reschedule your appointment if you arrive late (15 or more minutes).  Arriving late affects you and other patients whose appointments are after yours.  Also, if you miss three or more appointments without notifying the office, you may be dismissed from the clinic at the provider's discretion.      For prescription refill requests, have your pharmacy contact our office and allow 72 hours for refills to be completed.    Today you received Venofer IV iron infusion.  BELOW ARE SYMPTOMS THAT SHOULD BE REPORTED IMMEDIATELY: *FEVER GREATER THAN 100.4 F (38 C) OR HIGHER *CHILLS OR SWEATING *NAUSEA AND VOMITING THAT IS NOT CONTROLLED WITH YOUR NAUSEA MEDICATION *UNUSUAL SHORTNESS OF BREATH *UNUSUAL BRUISING OR BLEEDING *URINARY PROBLEMS (pain or burning when urinating, or frequent urination) *BOWEL PROBLEMS (unusual diarrhea, constipation, pain near the anus) TENDERNESS IN MOUTH AND THROAT WITH OR WITHOUT PRESENCE OF ULCERS (sore throat, sores in mouth, or a toothache) UNUSUAL RASH, SWELLING OR PAIN  UNUSUAL VAGINAL DISCHARGE OR ITCHING   Items with * indicate a potential emergency and should be followed up as soon as possible or go to the Emergency Department if any problems should occur.  Please show the CHEMOTHERAPY ALERT CARD or IMMUNOTHERAPY ALERT CARD at check-in to the Emergency Department and  triage nurse.  Should you have questions after your visit or need to cancel or reschedule your appointment, please contact [facility] [phone]  and follow the prompts.  Office hours are 8:00 a.m. to 4:30 p.m. Monday - Friday. Please note that voicemails left after 4:00 p.m. may not be returned until the following business day.  We are closed weekends and major holidays. You have access to a nurse at all times for urgent questions. Please call the main number to the clinic [phone] and follow the prompts.  For any non-urgent questions, you may also contact your provider using MyChart. We now offer e-Visits for anyone 18 and older to request care online for non-urgent symptoms. For details visit [email or web address].   Also download the MyChart app! Go to the app store, search "MyChart", open the app, select [facility], and log in with your MyChart username and password.   

## 2022-12-19 ENCOUNTER — Encounter: Payer: Self-pay | Admitting: *Deleted

## 2022-12-19 LAB — HM DIABETES EYE EXAM

## 2022-12-24 ENCOUNTER — Encounter: Payer: Self-pay | Admitting: *Deleted

## 2022-12-24 ENCOUNTER — Other Ambulatory Visit: Payer: Self-pay | Admitting: Family Medicine

## 2022-12-24 DIAGNOSIS — G2581 Restless legs syndrome: Secondary | ICD-10-CM

## 2022-12-26 ENCOUNTER — Other Ambulatory Visit: Payer: Self-pay

## 2022-12-31 ENCOUNTER — Ambulatory Visit (HOSPITAL_COMMUNITY): Payer: Medicare HMO

## 2023-01-11 ENCOUNTER — Other Ambulatory Visit: Payer: Self-pay | Admitting: Family Medicine

## 2023-01-15 DIAGNOSIS — Z794 Long term (current) use of insulin: Secondary | ICD-10-CM | POA: Diagnosis not present

## 2023-01-15 DIAGNOSIS — Z008 Encounter for other general examination: Secondary | ICD-10-CM | POA: Diagnosis not present

## 2023-01-15 DIAGNOSIS — N529 Male erectile dysfunction, unspecified: Secondary | ICD-10-CM | POA: Diagnosis not present

## 2023-01-15 DIAGNOSIS — M1990 Unspecified osteoarthritis, unspecified site: Secondary | ICD-10-CM | POA: Diagnosis not present

## 2023-01-15 DIAGNOSIS — E1122 Type 2 diabetes mellitus with diabetic chronic kidney disease: Secondary | ICD-10-CM | POA: Diagnosis not present

## 2023-01-15 DIAGNOSIS — N1832 Chronic kidney disease, stage 3b: Secondary | ICD-10-CM | POA: Diagnosis not present

## 2023-01-15 DIAGNOSIS — K219 Gastro-esophageal reflux disease without esophagitis: Secondary | ICD-10-CM | POA: Diagnosis not present

## 2023-01-15 DIAGNOSIS — G4700 Insomnia, unspecified: Secondary | ICD-10-CM | POA: Diagnosis not present

## 2023-01-15 DIAGNOSIS — E11620 Type 2 diabetes mellitus with diabetic dermatitis: Secondary | ICD-10-CM | POA: Diagnosis not present

## 2023-01-15 DIAGNOSIS — G2581 Restless legs syndrome: Secondary | ICD-10-CM | POA: Diagnosis not present

## 2023-01-15 DIAGNOSIS — I130 Hypertensive heart and chronic kidney disease with heart failure and stage 1 through stage 4 chronic kidney disease, or unspecified chronic kidney disease: Secondary | ICD-10-CM | POA: Diagnosis not present

## 2023-01-15 DIAGNOSIS — I509 Heart failure, unspecified: Secondary | ICD-10-CM | POA: Diagnosis not present

## 2023-01-15 DIAGNOSIS — E669 Obesity, unspecified: Secondary | ICD-10-CM | POA: Diagnosis not present

## 2023-01-19 ENCOUNTER — Telehealth: Payer: Self-pay | Admitting: Gastroenterology

## 2023-01-19 NOTE — Telephone Encounter (Signed)
Review of [facility] records, patient last seen by them 08/2022 with plan to follow up in 04/2023 but they advised he come here in June and have ruq u/s done.   Let's proceed with RUQ U/S now for hepatoma screening.   We are off a bit on labs and follow up at this point, so if he is feeling well we can have him follow up with [facility] in 04/2023 as planned and return to see [place] in 07/2023. If he has any concerns, make appt with [place] now.

## 2023-01-21 ENCOUNTER — Encounter: Payer: Self-pay | Admitting: *Deleted

## 2023-01-21 ENCOUNTER — Other Ambulatory Visit: Payer: Self-pay | Admitting: *Deleted

## 2023-01-21 DIAGNOSIS — K7030 Alcoholic cirrhosis of liver without ascites: Secondary | ICD-10-CM

## 2023-01-21 NOTE — Telephone Encounter (Signed)
He is aware he will need an appt, I advised him of that when he was on the phone.

## 2023-01-21 NOTE — Telephone Encounter (Signed)
Pt was made aware and verbalized understanding. Pt is going to hold off and f/u with [facility] in Nov. Pt states that he is doing well. Pt is also due for colonoscopy and will be mailing in colonoscopy survey to be scheduled for that. Pt is ready to move forward with scheduling [place] now.

## 2023-01-21 NOTE — Telephone Encounter (Signed)
Austin Morgan/Austin Morgan, he would be ASA 3 so he would need an ov prior to scheduling colonoscopy. No need to send him survey.

## 2023-01-21 NOTE — Telephone Encounter (Signed)
[place] scheduled for 01/23/23 arrive at Wednesday 7:30 am, Nothing to eat or drink after midnight. MyChart message sent with appt date, time and instructions.Marland Kitchen  LMTRC

## 2023-01-21 NOTE — Telephone Encounter (Signed)
OK. If he would like to move forward with colonoscopy, he will need to make ov since he is ASA 3. Just make sure he is aware.   Maybe Tammy R or Mindy can let him know when they get him scheduled for u/s.

## 2023-01-21 NOTE — Telephone Encounter (Signed)
Noted, one was previously mailed to him was why I put another back in the mail.

## 2023-01-22 ENCOUNTER — Encounter: Payer: Self-pay | Admitting: *Deleted

## 2023-01-22 NOTE — Telephone Encounter (Signed)
Pt informed of [place] appointment date, time and instructions.

## 2023-01-23 ENCOUNTER — Other Ambulatory Visit: Payer: Self-pay | Admitting: Family Medicine

## 2023-01-23 ENCOUNTER — Ambulatory Visit (HOSPITAL_COMMUNITY)
Admission: RE | Admit: 2023-01-23 | Discharge: 2023-01-23 | Disposition: A | Discharge status: Home / Self Care | Payer: Medicare HMO | Source: Ambulatory Visit | Attending: Gastroenterology | Admitting: Gastroenterology

## 2023-01-23 DIAGNOSIS — K828 Other specified diseases of gallbladder: Secondary | ICD-10-CM | POA: Diagnosis not present

## 2023-01-23 DIAGNOSIS — Z1289 Encounter for screening for malignant neoplasm of other sites: Secondary | ICD-10-CM | POA: Diagnosis not present

## 2023-01-23 DIAGNOSIS — K7030 Alcoholic cirrhosis of liver without ascites: Secondary | ICD-10-CM | POA: Insufficient documentation

## 2023-01-23 NOTE — Telephone Encounter (Signed)
Pt has been scheduled appt to discuss scheduling tcs and is aware.

## 2023-01-28 ENCOUNTER — Encounter: Payer: Self-pay | Admitting: Family Medicine

## 2023-01-28 ENCOUNTER — Other Ambulatory Visit: Payer: Self-pay | Admitting: Family Medicine

## 2023-01-28 DIAGNOSIS — E119 Type 2 diabetes mellitus without complications: Secondary | ICD-10-CM

## 2023-01-28 MED ORDER — NOVOLOG FLEXPEN 100 UNIT/ML ~~LOC~~ SOPN
PEN_INJECTOR | SUBCUTANEOUS | 6 refills | Status: DC
Start: 2023-01-28 — End: 2023-04-03

## 2023-01-28 MED ORDER — LANTUS SOLOSTAR 100 UNIT/ML ~~LOC~~ SOPN: PEN_INJECTOR | SUBCUTANEOUS | 6 refills | Status: DC

## 2023-02-01 DIAGNOSIS — D638 Anemia in other chronic diseases classified elsewhere: Secondary | ICD-10-CM | POA: Diagnosis not present

## 2023-02-01 DIAGNOSIS — N2581 Secondary hyperparathyroidism of renal origin: Secondary | ICD-10-CM | POA: Diagnosis not present

## 2023-02-01 DIAGNOSIS — R809 Proteinuria, unspecified: Secondary | ICD-10-CM | POA: Diagnosis not present

## 2023-02-01 DIAGNOSIS — E559 Vitamin D deficiency, unspecified: Secondary | ICD-10-CM | POA: Diagnosis not present

## 2023-02-01 DIAGNOSIS — N189 Chronic kidney disease, unspecified: Secondary | ICD-10-CM | POA: Diagnosis not present

## 2023-02-01 DIAGNOSIS — N184 Chronic kidney disease, stage 4 (severe): Secondary | ICD-10-CM | POA: Diagnosis not present

## 2023-02-06 DIAGNOSIS — E1129 Type 2 diabetes mellitus with other diabetic kidney complication: Secondary | ICD-10-CM | POA: Diagnosis not present

## 2023-02-06 DIAGNOSIS — R809 Proteinuria, unspecified: Secondary | ICD-10-CM | POA: Diagnosis not present

## 2023-02-06 DIAGNOSIS — E1122 Type 2 diabetes mellitus with diabetic chronic kidney disease: Secondary | ICD-10-CM | POA: Diagnosis not present

## 2023-02-06 DIAGNOSIS — I5032 Chronic diastolic (congestive) heart failure: Secondary | ICD-10-CM | POA: Diagnosis not present

## 2023-02-10 DIAGNOSIS — E1169 Type 2 diabetes mellitus with other specified complication: Secondary | ICD-10-CM | POA: Diagnosis not present

## 2023-02-11 NOTE — Progress Notes (Unsigned)
GI Office Note    Referring Provider: Babs Sciara, MD Primary Care Physician:  Babs Sciara, MD  Primary Gastroenterologist: Roetta Sessions, MD   Chief Complaint   No chief complaint on file.   History of Present Illness   Austin Morgan is a 67 y.o. male presenting today to schedule colonoscopy. Last seen 06/2022. H/o decompensated alcohol cirrhosis complicated by HE, variceal bleeding.  Quit alcohol in November 2022.  Followed by hematology for iron deficiency anemia.  Patient did not tolerate coming off PPI therapy completed (as requested by Dr. Hamilton Capri as potential contributing factor to his IDA). Last EGD in December with grade 1 esophageal varices, no banding attempted. He had prominent portal gastropathy. Plans for next EGD in 9 months. Has been on nadolol 20 mg daily. Patient is due for surveillance colonoscopy this year due to having 9 polyps (tubular adenomas) removed in 01/2020.   Followed with [facility]. Last seen 08/2022. At that time MELD 3.0 was 17, driven up by elevated creatinine in setting of CKD. Per [facility], no EGD needed for variceal surveillance since varices now small and on nadolol (unless concerns for bleeding).      EGD 05/2022: Grade 1 esophageal varices?quiescent -no banding attempted. -Prominent portal gastropathy. -Patient appears at goal with his in NSBB. -next EGD in 9 months      Medications   Current Outpatient Medications  Medication Sig Dispense Refill   albuterol (VENTOLIN HFA) 108 (90 Base) MCG/ACT inhaler INHALE 2 PUFFS BY MOUTH EVERY 6 HOURS AS NEEDED FOR WHEEZING 9 g 0   BD PEN NEEDLE NANO 2ND GEN 32G X 4 MM MISC USE TO ADMINISTER INSULIN     Continuous Blood Gluc Receiver (DEXCOM G6 RECEIVER) DEVI 1 Device by Does not apply route continuous. 1 each 1   Continuous Blood Gluc Sensor (DEXCOM G6 SENSOR) MISC 1 Units by Does not apply route continuous. (Patient taking differently: 1 Units by Does not apply route continuous. G7) 3 each  1   cyanocobalamin (VITAMIN B12) 500 MCG tablet Take 500 mcg by mouth every Monday, Wednesday, and Friday.     diclofenac Sodium (VOLTAREN) 1 % GEL Apply 2 g topically in the morning, at noon, and at bedtime.     ferrous sulfate 325 (65 FE) MG EC tablet Take 325 mg by mouth every morning.     furosemide (LASIX) 40 MG tablet Take 1 tablet (40 mg total) by mouth 2 (two) times daily. 30 tablet 1   insulin aspart (NOVOLOG FLEXPEN) 100 UNIT/ML FlexPen Inject 20 units in the AM, Inject 10 units at lunch, and inject 20 units at supper 30 mL 6   insulin glargine (LANTUS SOLOSTAR) 100 UNIT/ML Solostar Pen Inject 66 units subcutaneously every evening may titrate up to 80 units 30 mL 6   lactulose (CHRONULAC) 10 GM/15ML solution Take 30 mLs (20 g total) by mouth in the morning, at noon, in the evening, and at bedtime. 3600 mL 5   magnesium oxide (MAG-OX) 400 (240 Mg) MG tablet Take 400 mg by mouth daily.     metolazone (ZAROXOLYN) 5 MG tablet Take 5 mg by mouth every Monday, Wednesday, and Friday.     Multiple Vitamins-Minerals (MULTIVITAMIN WITH MINERALS) tablet Take 1 tablet by mouth daily.     nadolol (CORGARD) 40 MG tablet Take 1/2 (one-half) tablet by mouth once daily (Patient taking differently: Take 20 mg by mouth at bedtime.) 45 tablet 5   ondansetron (ZOFRAN) 8 MG tablet  TAKE 1 TABLET BY MOUTH THREE TIMES DAILY AS NEEDED FOR NAUSEA 20 tablet 3   pantoprazole (PROTONIX) 40 MG tablet Take 1 tablet (40 mg total) by mouth 2 (two) times daily. (Patient taking differently: Take 40 mg by mouth daily as needed (acid reflux).) 60 tablet 3   pramipexole (MIRAPEX) 0.25 MG tablet TAKE 1 TABLET BY MOUTH AT BEDTIME 90 tablet 0   rifaximin (XIFAXAN) 550 MG TABS tablet Take 550 mg by mouth 2 (two) times daily.     spironolactone (ALDACTONE) 25 MG tablet Take 25 mg by mouth every Monday, Wednesday, and Friday.     tadalafil (CIALIS) 20 MG tablet TAKE 1/2 TO 1 (ONE-HALF TO ONE) TABLET BY MOUTH EVERY OTHER DAY AS NEEDED  FOR  ERECTILE  DYSFUNCTION 6 tablet 3   traZODone (DESYREL) 100 MG tablet One and one half qhs 135 tablet 1   triamcinolone cream (KENALOG) 0.1 % Apply thin amount bid prn 80 g 4   No current facility-administered medications for this visit.    Allergies   Allergies as of 02/12/2023 - Review Complete 12/06/2022  Allergen Reaction Noted   Codeine Nausea And Vomiting 11/26/2008     Past Medical History   Past Medical History:  Diagnosis Date   Anxiety    Arthritis    Depression    Diabetes mellitus without complication (HCC)    ED (erectile dysfunction)    Gout    H/O ETOH abuse    Hyperlipidemia    Hypertension    Hypertriglyceridemia    Iron deficiency anemia due to chronic blood loss 12/20/2021   PONV (postoperative nausea and vomiting)    Sleep apnea     Past Surgical History   Past Surgical History:  Procedure Laterality Date   CARPAL TUNNEL RELEASE Bilateral    COLONOSCOPY  2010   Dr. Lina Sar: mild diverticulosis, next colonoscopy 2020   COLONOSCOPY WITH PROPOFOL N/A 01/21/2020   Rourk: 9 polyps removed, multiple tubular adenomas.  Next colonoscopy in 3 years.   ELBOW SURGERY     ESOPHAGEAL BANDING  09/28/2021   Procedure: ESOPHAGEAL BANDING;  Surgeon: Corbin Ade, MD;  [facility]: [facility];  Service: Endoscopy;;   ESOPHAGOGASTRODUODENOSCOPY (EGD) WITH PROPOFOL N/A 01/21/2020   Rourk: normal   ESOPHAGOGASTRODUODENOSCOPY (EGD) WITH PROPOFOL N/A 09/28/2021   Procedure: ESOPHAGOGASTRODUODENOSCOPY (EGD) WITH PROPOFOL;  Surgeon: Corbin Ade, MD;  [facility];  Service: Endoscopy;  Laterality: N/A;  3:00pm   ESOPHAGOGASTRODUODENOSCOPY (EGD) WITH PROPOFOL N/A 10/31/2021   Procedure: ESOPHAGOGASTRODUODENOSCOPY (EGD) WITH PROPOFOL;  Surgeon: Dolores Frame, MD;  [facility];  Service: Gastroenterology;  Laterality: N/A;   ESOPHAGOGASTRODUODENOSCOPY (EGD) WITH PROPOFOL N/A 05/23/2022   Procedure: ESOPHAGOGASTRODUODENOSCOPY  (EGD) WITH PROPOFOL;  Surgeon: Corbin Ade, MD;  [facility];  Service: Endoscopy;  Laterality: N/A;  8:30am, asa 3   NECK SURGERY     c4   POLYPECTOMY  01/21/2020   Procedure: POLYPECTOMY;  Surgeon: Corbin Ade, MD;  [facility];  Service: Endoscopy;;  colon    Past Family History   Family History  Problem Relation Age of Onset   Heart attack Father    Heart Problems Brother        Had open heart surgery   Colon cancer Neg Hx     Past Social History   Social History   Socioeconomic History   Marital status: Married    Spouse name: Not on file  Number of children: Not on file   Years of education: Not on file   Highest education level: Not on file  Occupational History   Not on file  Tobacco Use   Smoking status: Former    Current packs/day: 0.00    Average packs/day: 1 pack/day for 36.0 years (36.0 ttl pk-yrs)    Types: Cigarettes    Start date: 03/11/1980    Quit date: 03/11/2016    Years since quitting: 6.9    Passive exposure: Past   Smokeless tobacco: Never  Vaping Use   Vaping status: Never Used  Substance and Sexual Activity   Alcohol use: Not Currently    Comment: drinks a fifth of liquor per week; inpatient rehab 08/2019. denied 10/14/19. No etoh since 03/2021 (as of 09/22/21)   Drug use: No   Sexual activity: Yes  Other Topics Concern   Not on file  Social History Narrative   Wife, Dantavious Goblirsch NP [facility]   Social Determinants of Health   Financial Resource Strain: Low Risk  (08/24/2022)   Overall Financial Resource Strain (CARDIA)    Difficulty of Paying Living Expenses: Not hard at all  Food Insecurity: No Food Insecurity (08/24/2022)   Hunger Vital Sign    Worried About Running Out of Food in the Last Year: Never true    Ran Out of Food in the Last Year: Never true  Transportation Needs: No Transportation Needs (08/24/2022)   PRAPARE - Administrator, Civil Service (Medical): No     Lack of Transportation (Non-Medical): No  Physical Activity: Inactive (08/24/2022)   Exercise Vital Sign    Days of Exercise per Week: 0 days    Minutes of Exercise per Session: 0 min  Stress: No Stress Concern Present (08/24/2022)   [employer] of Occupational Health - Occupational Stress Questionnaire    Feeling of Stress : Not at all  Social Connections: Socially Integrated (08/24/2022)   Social Connection and Isolation Panel [NHANES]    Frequency of Communication with Friends and Family: More than three times a week    Frequency of Social Gatherings with Friends and Family: More than three times a week    Attends Religious Services: More than 4 times per year    Active Member of [employer] or Organizations: Yes    Attends Engineer, structural: More than 4 times per year    Marital Status: Married  Catering manager Violence: Not At Risk (08/24/2022)   Humiliation, Afraid, Rape, and Kick questionnaire    Fear of Current or Ex-Partner: No    Emotionally Abused: No    Physically Abused: No    Sexually Abused: No    Review of Systems   General: Negative for anorexia, weight loss, fever, chills, fatigue, weakness. ENT: Negative for hoarseness, difficulty swallowing , nasal congestion. CV: Negative for chest pain, angina, palpitations, dyspnea on exertion, peripheral edema.  Respiratory: Negative for dyspnea at rest, dyspnea on exertion, cough, sputum, wheezing.  GI: See history of present illness. GU:  Negative for dysuria, hematuria, urinary incontinence, urinary frequency, nocturnal urination.  Endo: Negative for unusual weight change.     Physical Exam   There were no vitals taken for this visit.   General: Well-nourished, well-developed in no acute distress.  Eyes: No icterus. Mouth: Oropharyngeal mucosa moist and pink , no lesions erythema or exudate. Lungs: Clear to auscultation bilaterally.  Heart: Regular rate and rhythm, no murmurs rubs or gallops.   Abdomen:  Bowel sounds are normal, nontender, nondistended, no hepatosplenomegaly or masses,  no abdominal bruits or hernia , no rebound or guarding.  Rectal: ***  Extremities: No lower extremity edema. No clubbing or deformities. Neuro: Alert and oriented x 4   Skin: Warm and dry, no jaundice.   Psych: Alert and cooperative, normal mood and affect.  Labs   Lab Results  Component Value Date   NA 137 11/23/2022   CL 100 11/23/2022   K 3.6 11/23/2022   CO2 26 11/23/2022   BUN 48 (H) 11/23/2022   CREATININE 2.46 (H) 11/23/2022   GFRNONAA 28 (L) 11/23/2022   CALCIUM 9.2 11/23/2022   PHOS 3.7 05/22/2022   ALBUMIN 3.9 11/23/2022   GLUCOSE 184 (H) 11/23/2022   Lab Results  Component Value Date   WBC 5.5 11/23/2022   HGB 12.0 (L) 11/23/2022   HCT 33.4 (L) 11/23/2022   MCV 93.8 11/23/2022   PLT 103 (L) 11/23/2022   Lab Results  Component Value Date   ALT 20 11/23/2022   AST 26 11/23/2022   GGT 570 (H) 06/01/2020   ALKPHOS 107 11/23/2022   BILITOT 1.4 (H) 11/23/2022   Lab Results  Component Value Date   IRON 94 11/23/2022   TIBC 316 11/23/2022   FERRITIN 88 11/23/2022   Lab Results  Component Value Date   FOLATE >40.0 12/20/2021   Lab Results  Component Value Date   VITAMINB12 1,133 (H) 11/23/2022   Lab Results  Component Value Date   INR 1.3 (H) 02/27/2022   INR 1.3 (H) 02/26/2022   INR 1.3 (H) 02/24/2022    Imaging Studies   US ABDOMEN LIMITED RUQ (LIVER/GB)  Result Date: 01/23/2023 CLINICAL DATA:  Hepatoma screening EXAM: ULTRASOUND ABDOMEN LIMITED RIGHT UPPER QUADRANT COMPARISON:  December 4th 20.3 FINDINGS: Gallbladder: The gallbladder is under distended. The gallbladder wall measures 4.3 mm. No gallstones visualized. No sonographic Murphy sign noted by sonographer. Common bile duct: Diameter: 2.6 mm Liver: No focal lesion identified. Increased echotexture. Portal vein is patent on color Doppler imaging with normal direction of blood flow towards the  liver. Other: None. IMPRESSION: 1. No focal liver lesion identified. Increased echotexture consistent with fatty infiltration. 2. The gallbladder is under distended. The gallbladder wall measures 4.3 mm. This is nonspecific and may be due to under distention. Electronically Signed   By: Sherian Rein M.D.   On: 01/23/2023 08:15    Assessment       PLAN   ***   Leanna Battles. Melvyn Neth, MHS, PA-C [facility]

## 2023-02-12 ENCOUNTER — Encounter: Payer: Self-pay | Admitting: Gastroenterology

## 2023-02-12 ENCOUNTER — Ambulatory Visit: Payer: Medicare HMO | Admitting: Gastroenterology

## 2023-02-12 VITALS — BP 115/64 | HR 64 | Temp 98.3°F | Ht 66.0 in | Wt 203.2 lb

## 2023-02-12 DIAGNOSIS — K7030 Alcoholic cirrhosis of liver without ascites: Secondary | ICD-10-CM

## 2023-02-12 DIAGNOSIS — Z86010 Personal history of colonic polyps: Secondary | ICD-10-CM | POA: Diagnosis not present

## 2023-02-12 DIAGNOSIS — K219 Gastro-esophageal reflux disease without esophagitis: Secondary | ICD-10-CM | POA: Diagnosis not present

## 2023-02-12 NOTE — Patient Instructions (Addendum)
Colonoscopy to be scheduled. See separate instructions. Continue to follow with [facility] as planned in 04/2023.  Please call with any questions or concerns.

## 2023-02-14 ENCOUNTER — Encounter: Payer: Self-pay | Admitting: Orthopedic Surgery

## 2023-02-14 ENCOUNTER — Ambulatory Visit: Payer: Medicare HMO | Admitting: Orthopedic Surgery

## 2023-02-14 ENCOUNTER — Other Ambulatory Visit (INDEPENDENT_AMBULATORY_CARE_PROVIDER_SITE_OTHER): Payer: Medicare HMO

## 2023-02-14 VITALS — BP 111/71 | HR 73 | Ht 66.0 in | Wt 202.0 lb

## 2023-02-14 DIAGNOSIS — M25561 Pain in right knee: Secondary | ICD-10-CM

## 2023-02-14 DIAGNOSIS — M1711 Unilateral primary osteoarthritis, right knee: Secondary | ICD-10-CM | POA: Diagnosis not present

## 2023-02-14 DIAGNOSIS — M23321 Other meniscus derangements, posterior horn of medial meniscus, right knee: Secondary | ICD-10-CM

## 2023-02-14 MED ORDER — METHYLPREDNISOLONE ACETATE 40 MG/ML IJ SUSP
40.0000 mg | Freq: Once | INTRAMUSCULAR | Status: AC
Start: 2023-02-14 — End: 2023-02-14
  Administered 2023-02-14: 40 mg via INTRA_ARTICULAR

## 2023-02-14 NOTE — Patient Instructions (Signed)
Physical therapy has been ordered for you at [facility]. They should call you to schedule, [phone] is the phone number to call, if you want to call to schedule.   

## 2023-02-14 NOTE — Addendum Note (Signed)
Addended byCaffie Damme on: 02/14/2023 05:03 PM   Modules accepted: Orders

## 2023-02-14 NOTE — Progress Notes (Signed)
Chief Complaint  Patient presents with   Knee Pain    Pt states he stepped off of his truck    67 year old male stepped off a platform about 4 weeks ago complains of right knee pain with acute onset.  His knee extended and externally rotated and popped  He comes in now with persistent pain is negative other than some feeling that the knee may give way.  Past Medical History:  Diagnosis Date   Anxiety    Arthritis    Cirrhosis (HCC)    Depression    Diabetes mellitus without complication (HCC)    ED (erectile dysfunction)    Gout    H/O ETOH abuse    in remission   Hyperlipidemia    Hypertension    Hypertriglyceridemia    Iron deficiency anemia due to chronic blood loss 12/20/2021   PONV (postoperative nausea and vomiting)    Sleep apnea    BP 111/71   Pulse 73   Ht 5\' 6"  (1.676 m)   Wt 202 lb (91.6 kg)   BMI 32.60 kg/m  Physical Exam Vitals and nursing note reviewed.  Constitutional:      Appearance: Normal appearance.  HENT:     Head: Normocephalic and atraumatic.  Eyes:     General: No scleral icterus.       Right eye: No discharge.        Left eye: No discharge.     Extraocular Movements: Extraocular movements intact.     Conjunctiva/sclera: Conjunctivae normal.     Pupils: Pupils are equal, round, and reactive to light.  Cardiovascular:     Rate and Rhythm: Normal rate.     Pulses: Normal pulses.  Skin:    General: Skin is warm and dry.     Capillary Refill: Capillary refill takes less than 2 seconds.  Neurological:     General: No focal deficit present.     Mental Status: He is alert and oriented to person, place, and time.  Psychiatric:        Mood and Affect: Mood normal.        Behavior: Behavior normal.        Thought Content: Thought content normal.        Judgment: Judgment normal.    Right knee exam tenderness over the medial compartment and medial Murray sign.  There is a small effusion the knee well ligaments are stable  He has good muscle  tone in the quadriceps  Imaging shows just some mild narrowing of the medial compartment with joint effusion.  Mild OA is noted  Assessment and plan   Encounter Diagnoses  Name Primary?   Acute pain of right knee Yes   Derangement of posterior horn of medial meniscus of right knee    Primary osteoarthritis of right knee     acute onset knee pain traumatic onset probably has meniscus tear insurance requires physical therapy and medication  Patient has a history of alcoholic cirrhosis and I agree with him that we should not use any medication that would elevate his LFTs so were going to inject the knee and send him for physical therapy I will see him 4 weeks if he is not better we will get an MRI  Procedure note right knee injection   verbal consent was obtained to inject right knee joint  Timeout was completed to confirm the site of injection  The medications used were depomedrol 40 mg and 1% lidocaine 3 cc Anesthesia was  provided by ethyl chloride and the skin was prepped with alcohol.  After cleaning the skin with alcohol a 20-gauge needle was used to inject the right knee joint. There were no complications. A sterile bandage was applied.

## 2023-02-25 ENCOUNTER — Ambulatory Visit (INDEPENDENT_AMBULATORY_CARE_PROVIDER_SITE_OTHER): Payer: Medicare HMO | Admitting: Family Medicine

## 2023-02-25 ENCOUNTER — Encounter: Payer: Self-pay | Admitting: Family Medicine

## 2023-02-25 ENCOUNTER — Encounter: Payer: Self-pay | Admitting: *Deleted

## 2023-02-25 VITALS — BP 118/66 | HR 57 | Temp 98.6°F | Wt 202.4 lb

## 2023-02-25 DIAGNOSIS — G2581 Restless legs syndrome: Secondary | ICD-10-CM | POA: Diagnosis not present

## 2023-02-25 DIAGNOSIS — E785 Hyperlipidemia, unspecified: Secondary | ICD-10-CM | POA: Diagnosis not present

## 2023-02-25 DIAGNOSIS — K7469 Other cirrhosis of liver: Secondary | ICD-10-CM | POA: Diagnosis not present

## 2023-02-25 DIAGNOSIS — E119 Type 2 diabetes mellitus without complications: Secondary | ICD-10-CM | POA: Diagnosis not present

## 2023-02-25 DIAGNOSIS — N1832 Chronic kidney disease, stage 3b: Secondary | ICD-10-CM

## 2023-02-25 DIAGNOSIS — E1169 Type 2 diabetes mellitus with other specified complication: Secondary | ICD-10-CM

## 2023-02-25 DIAGNOSIS — Z794 Long term (current) use of insulin: Secondary | ICD-10-CM | POA: Diagnosis not present

## 2023-02-25 DIAGNOSIS — D631 Anemia in chronic kidney disease: Secondary | ICD-10-CM | POA: Diagnosis not present

## 2023-02-25 DIAGNOSIS — G479 Sleep disorder, unspecified: Secondary | ICD-10-CM

## 2023-02-25 DIAGNOSIS — Z23 Encounter for immunization: Secondary | ICD-10-CM

## 2023-02-25 MED ORDER — TRAZODONE HCL 100 MG PO TABS: ORAL_TABLET | ORAL | 1 refills | Status: DC

## 2023-02-25 MED ORDER — NA SULFATE-K SULFATE-MG SULF 17.5-3.13-1.6 GM/177ML PO SOLN: ORAL | 0 refills | Status: DC

## 2023-02-25 MED ORDER — PEG 3350-KCL-NA BICARB-NACL 420 G PO SOLR: 4000.0000 mL | Freq: Once | ORAL | 0 refills | Status: AC

## 2023-02-25 MED ORDER — PRAMIPEXOLE DIHYDROCHLORIDE 0.25 MG PO TABS
0.2500 mg | ORAL_TABLET | Freq: Every day | ORAL | 2 refills | Status: AC
Start: 2023-02-25 — End: ?

## 2023-02-25 NOTE — Telephone Encounter (Signed)
Per note patient needs trilyte prep due to kidney issues  Called [facility] and had to LMOVM to cancel suprep rx sent in. Trilyte sent in.

## 2023-02-25 NOTE — Progress Notes (Addendum)
 Subjective:    Patient ID: Austin Morgan, male    DOB: 26-Apr-1956, 67 y.o.   MRN: [id number]  HPI Patient coming in for a 4 month follow up for Diabetes. No further issues or concerns as per patient.  Outpatient Encounter Medications as of 02/25/2023  Medication Sig   albuterol  (VENTOLIN  HFA) 108 (90 Base) MCG/ACT inhaler INHALE 2 PUFFS BY MOUTH EVERY 6 HOURS AS NEEDED FOR WHEEZING   augmented betamethasone dipropionate (DIPROLENE-AF) 0.05 % cream Apply topically 2 (two) times daily as needed.   BD PEN NEEDLE NANO 2ND GEN 32G X 4 MM MISC USE TO ADMINISTER INSULIN    Continuous Blood Gluc Receiver (DEXCOM G6 RECEIVER) DEVI 1 Device by Does not apply route continuous.   Continuous Blood Gluc Sensor (DEXCOM G6 SENSOR) MISC 1 Units by Does not apply route continuous. (Patient taking differently: 1 Units by Does not apply route continuous. G7)   cyanocobalamin  (VITAMIN B12) 500 MCG tablet Take 500 mcg by mouth every Monday, Wednesday, and Friday.   diclofenac Sodium (VOLTAREN) 1 % GEL Apply 2 g topically in the morning, at noon, and at bedtime.   ferrous sulfate 325 (65 FE) MG EC tablet Take 325 mg by mouth every morning.   insulin  aspart (NOVOLOG  FLEXPEN) 100 UNIT/ML FlexPen Inject 20 units in the AM, Inject 10 units at lunch, and inject 20 units at supper   insulin  glargine (LANTUS  SOLOSTAR) 100 UNIT/ML Solostar Pen Inject 66 units subcutaneously every evening may titrate up to 80 units   Multiple Vitamins-Minerals (MULTIVITAMIN WITH MINERALS) tablet Take 1 tablet by mouth daily.   nadolol  (CORGARD ) 40 MG tablet Take 1/2 (one-half) tablet by mouth once daily (Patient taking differently: Take 20 mg by mouth at bedtime.)   ondansetron  (ZOFRAN ) 8 MG tablet TAKE 1 TABLET BY MOUTH THREE TIMES DAILY AS NEEDED FOR NAUSEA   pantoprazole  (PROTONIX ) 40 MG tablet Take 1 tablet (40 mg total) by mouth 2 (two) times daily. (Patient taking differently: Take 40 mg by mouth daily as needed (acid reflux).)    polyethylene glycol-electrolytes (NULYTELY) 420 g solution Take 4,000 mLs by mouth once for 1 dose.   rifaximin  (XIFAXAN ) 550 MG TABS tablet Take 550 mg by mouth 2 (two) times daily.   spironolactone  (ALDACTONE ) 25 MG tablet Take 25 mg by mouth every Monday, Wednesday, and Friday.   tadalafil  (CIALIS ) 20 MG tablet TAKE 1/2 TO 1 (ONE-HALF TO ONE) TABLET BY MOUTH EVERY OTHER DAY AS NEEDED FOR  ERECTILE  DYSFUNCTION   triamcinolone  cream (KENALOG ) 0.1 % Apply thin amount bid prn   [DISCONTINUED] pramipexole  (MIRAPEX ) 0.25 MG tablet TAKE 1 TABLET BY MOUTH AT BEDTIME   [DISCONTINUED] traZODone  (DESYREL ) 100 MG tablet One and one half qhs   furosemide  (LASIX ) 40 MG tablet Take 1 tablet (40 mg total) by mouth 2 (two) times daily.   magnesium  oxide (MAG-OX) 400 (240 Mg) MG tablet Take 400 mg by mouth daily. (Patient not taking: Reported on 02/25/2023)   metolazone (ZAROXOLYN) 5 MG tablet Take 5 mg by mouth every Monday, Wednesday, and Friday.   pramipexole  (MIRAPEX ) 0.25 MG tablet Take 1 tablet (0.25 mg total) by mouth at bedtime.   traZODone  (DESYREL ) 100 MG tablet One and one half qhs   No facility-administered encounter medications on file as of 02/25/2023.   Results for orders placed or performed in visit on 12/27/22  HM DIABETES EYE EXAM  Result Value Ref Range   HM Diabetic Eye Exam No Retinopathy No Retinopathy  Diabetes mellitus without complication (HCC) - Plan: Hemoglobin A1c  Restless leg syndrome - Plan: pramipexole  (MIRAPEX ) 0.25 MG tablet  Sleep disturbance - Plan: traZODone  (DESYREL ) 100 MG tablet  Anemia in stage 3b chronic kidney disease (HCC)  Hyperlipidemia associated with type 2 diabetes mellitus (HCC) - Plan: Lipid panel  Needs flu shot - Plan: Flu Vaccine Trivalent High Dose (Fluad)  Other cirrhosis of liver (HCC)  Patient does have restless legs doing well with medicine denies any major setbacks or problems Does have cirrhosis being followed by [facility]enterology also  [facility] to [facility] His restless leg syndrome overall doing well with the medicine Diabetes he monitors the readings on a regular basis states they seem to be doing well He sees Dr. Carrolyn Clan for his kidney issues and also sees gastroenterology regular basis The patient was seen today as part of a comprehensive diabetic check up. Patient has diabetes Patient relates good compliance with taking the medication. We discussed their diet and exercise activities  We also discussed the importance of notifying us  if any excessively high glucoses or low sugars.  Patient does use continuous glucose monitor has diabetes is followed on a every 53-month basis   Review of Systems     Objective:   Physical Exam  General-in no acute distress Eyes-no discharge Lungs-respiratory rate normal, CTA CV-no murmurs,RRR Extremities skin warm dry no edema Neuro grossly normal Behavior normal, alert       Assessment & Plan:  1. Restless leg syndrome Doing well on medicine refills given - pramipexole  (MIRAPEX ) 0.25 MG tablet; Take 1 tablet (0.25 mg total) by mouth at bedtime.  Dispense: 90 tablet; Refill: 2  2. Sleep disturbance Patient relates that helps him sleep at night also helps with moods - traZODone  (DESYREL ) 100 MG tablet; One and one half qhs  Dispense: 135 tablet; Refill: 1  3. Diabetes mellitus without complication (HCC) Daily monitoring numbers look good Check A1c The patient was seen today as part of a comprehensive visit for diabetes. The importance of keeping her A1c at or below 7 range was discussed.  Discussed diet, activity, and medication compliance Emphasized healthy eating primarily with vegetables fruits and if utilizing meats lean meats such as chicken or fish grilled baked broiled Avoid sugary drinks Minimize and avoid processed foods Fit in regular physical activity preferably 25 to 30 minutes 4 times per week Standard follow-up visit recommended.  Patient  aware lack of control and follow-up increases risk of diabetic complications. Regular follow-up visits Yearly ophthalmology Yearly foot exam  - Hemoglobin A1c  4. Anemia in stage 3b chronic kidney disease (HCC) Followed by specialists hemoglobin stable  5. Hyperlipidemia associated with type 2 diabetes mellitus (HCC) Not on statin cannot take with cirrhosis check lipid profile - Lipid panel  6. Needs flu shot Flu vaccine today - Flu Vaccine Trivalent High Dose (Fluad)  7. Other cirrhosis of liver (HCC) Doing exceptionally well currently, [facility] seeing [facility] every 6 to 9 months, sees local gastroenterology regular basis  Follow-up in approximately 5 to 6 months

## 2023-02-26 ENCOUNTER — Encounter: Payer: Self-pay | Admitting: *Deleted

## 2023-02-27 ENCOUNTER — Other Ambulatory Visit: Payer: Self-pay

## 2023-02-27 ENCOUNTER — Ambulatory Visit (HOSPITAL_COMMUNITY): Payer: Medicare HMO | Attending: Orthopedic Surgery | Admitting: Physical Therapy

## 2023-02-27 DIAGNOSIS — M25561 Pain in right knee: Secondary | ICD-10-CM | POA: Diagnosis not present

## 2023-02-27 DIAGNOSIS — M25562 Pain in left knee: Secondary | ICD-10-CM | POA: Diagnosis not present

## 2023-02-27 DIAGNOSIS — R2689 Other abnormalities of gait and mobility: Secondary | ICD-10-CM | POA: Insufficient documentation

## 2023-02-27 DIAGNOSIS — M6281 Muscle weakness (generalized): Secondary | ICD-10-CM | POA: Insufficient documentation

## 2023-02-27 NOTE — Therapy (Unsigned)
OUTPATIENT PHYSICAL THERAPY LOWER EXTREMITY EVALUATION   Patient Name: Theador Fithen MRN: [id number] DOB:[birth year], 67 y.o., male Today's Date: 02/28/2023  END OF SESSION:  PT End of Session - 02/27/23 1431     Visit Number 1    Number of Visits 6    Date for PT Re-Evaluation 04/11/23    Authorization Type [employer]    Progress Note Due on Visit 10    PT Start Time 1431    PT Stop Time 1510    PT Time Calculation (min) 39 min    Activity Tolerance Patient tolerated treatment well             Past Medical History:  Diagnosis Date   Anxiety    Arthritis    Cirrhosis (HCC)    Depression    Diabetes mellitus without complication (HCC)    ED (erectile dysfunction)    Gout    H/O ETOH abuse    in remission   Hyperlipidemia    Hypertension    Hypertriglyceridemia    Iron deficiency anemia due to chronic blood loss 12/20/2021   PONV (postoperative nausea and vomiting)    Sleep apnea    Past Surgical History:  Procedure Laterality Date   CARPAL TUNNEL RELEASE Bilateral    COLONOSCOPY  2010   Dr. Lina Sar: mild diverticulosis, next colonoscopy 2020   COLONOSCOPY WITH PROPOFOL N/A 01/21/2020   Rourk: 9 polyps removed, multiple tubular adenomas.  Next colonoscopy in 3 years.   ELBOW SURGERY     ESOPHAGEAL BANDING  09/28/2021   Procedure: ESOPHAGEAL BANDING;  Surgeon: Corbin Ade, MD;  Location: [facility];  Service: Endoscopy;;   ESOPHAGOGASTRODUODENOSCOPY (EGD) WITH PROPOFOL N/A 01/21/2020   Rourk: normal   ESOPHAGOGASTRODUODENOSCOPY (EGD) WITH PROPOFOL N/A 09/28/2021   Procedure: ESOPHAGOGASTRODUODENOSCOPY (EGD) WITH PROPOFOL;  Surgeon: Corbin Ade, MD;  Location: [facility];  Service: Endoscopy;  Laterality: N/A;  3:00pm   ESOPHAGOGASTRODUODENOSCOPY (EGD) WITH PROPOFOL N/A 10/31/2021   Procedure: ESOPHAGOGASTRODUODENOSCOPY (EGD) WITH PROPOFOL;  Surgeon: Dolores Frame, MD;  Location: [facility];  Service: Gastroenterology;   Laterality: N/A;   ESOPHAGOGASTRODUODENOSCOPY (EGD) WITH PROPOFOL N/A 05/23/2022   Procedure: ESOPHAGOGASTRODUODENOSCOPY (EGD) WITH PROPOFOL;  Surgeon: Corbin Ade, MD;  Location: [facility];  Service: Endoscopy;  Laterality: N/A;  8:30am, asa 3   NECK SURGERY     c4   POLYPECTOMY  01/21/2020   Procedure: POLYPECTOMY;  Surgeon: Corbin Ade, MD;  Location: [facility];  Service: Endoscopy;;  colon   Patient Active Problem List   Diagnosis Date Noted   Chronic diastolic (congestive) heart failure (HCC) 07/23/2022   Thrombocytopenia (HCC) 07/23/2022   Hx of adenomatous colonic polyps 06/18/2022   Hepatic encephalopathy (HCC) 02/25/2022   Hypokalemia 02/25/2022   Wheeze 02/25/2022   Alcoholic cirrhosis (HCC) 01/05/2022   Splenomegaly 12/20/2021   Iron deficiency anemia due to chronic blood loss 12/20/2021   Encephalopathy, hepatic (HCC) 12/18/2021   Diabetes mellitus without complication (HCC) 11/09/2021   Hypertension 11/09/2021   Chronic kidney disease, stage 3b (HCC) 11/01/2021   Volume overload 10/30/2021   Hypervolemia 10/30/2021   Edema 09/22/2021   Other pancytopenia (HCC) 09/20/2021   Acute renal failure syndrome (HCC) 09/20/2021   Cirrhosis of liver (HCC) 04/19/2021   Alkaline phosphatase raised 05/17/2020   Gastroesophageal reflux disease 10/14/2019   Constipation 10/14/2019   Colon cancer screening 10/14/2019   Macrocytic anemia 09/19/2019   Chronic alcoholism in remission (HCC) 09/18/2019  Disturbance in sleep behavior 09/18/2019   Restless leg syndrome 04/17/2019   Elevated liver enzymes 03/20/2019   Obstructive sleep apnea 02/26/2017   Erectile dysfunction 07/13/2013   Hyperlipidemia associated with type 2 diabetes mellitus (HCC) 09/26/2012    PCP: Babs Sciara, MD  REFERRING PROVIDER: Vickki Hearing, MD  REFERRING DIAG: [id number] (ICD-10-CM) - Acute pain of right knee  THERAPY DIAG:  Acute pain of left knee  Muscle weakness  (generalized)  Other abnormalities of gait and mobility  Rationale for Evaluation and Treatment: Rehabilitation  ONSET DATE: ~1 month ago  SUBJECTIVE:   SUBJECTIVE STATEMENT: Pt states he mowed his son's yard and stepped off the lawnmower wrong. Pt hurt his medial right knee. Pt is able to walk on it. Pt can't pivot on his right leg. Pt can feel it at rest but not hurting. When he does get a pain it feels like his knee is going to give out. Pt reports swelling is better now. Pt states he has to go through therapy before insurance will approve MRI. Reports MD thinks he might have a meniscal tear.  PERTINENT HISTORY: From H&P: stepped off a platform about 4 weeks ago complains of right knee pain with acute onset. His knee extended and externally rotated and popped  PAIN:  Are you having pain? Yes: NPRS scale: 1 currently; at worst 8 /10 Pain location: Medial R knee Pain description: Can be sharp Aggravating factors: Pointing toe, twisting on R leg Relieving factors: rest  PRECAUTIONS: None  RED FLAGS: None   WEIGHT BEARING RESTRICTIONS: No  FALLS:  Has patient fallen in last 6 months? Yes. 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  Subtle possible joint space narrowing without osteophytes   Impression grade 1 OA of the knee  PATIENT SURVEYS:  FOTO 66; predicted 71  COGNITION: Overall cognitive status: Within functional limits for tasks assessed     SENSATION: WFL  EDEMA:  None noted  MUSCLE LENGTH: Hamstrings: Right 80 deg; Left 80 deg Thomas test: WNL  POSTURE: No  Significant postural limitations  PALPATION: Medial knee tenderness along adductor   LOWER EXTREMITY ROM:  Active ROM Right eval Left eval  Hip flexion    Hip extension    Hip abduction    Hip adduction    Hip internal rotation    Hip external rotation    Knee flexion    Knee extension    Ankle dorsiflexion    Ankle plantarflexion    Ankle inversion    Ankle eversion     (Blank rows = not tested)  LOWER EXTREMITY MMT:  MMT Right eval Left eval  Hip flexion 5 5  Hip extension 4+ 4+  Hip abduction 4 4  Hip adduction 3+ * in supine (medial knee pain)   Hip internal rotation 5 5  Hip external rotation 4 5  Knee flexion 5 5  Knee extension 5 5  Ankle dorsiflexion 5 5  Ankle plantarflexion    Ankle inversion 4* (medial knee pain) 5  Ankle eversion 5 5   (Blank rows = not tested; * =  pain)  LOWER EXTREMITY SPECIAL TESTS:  Knee special tests: McMurray's test: negative and lateral ligament (-)  FUNCTIONAL TESTS:  L SLS: 28.94 sec; R SLS: 7.71 sec  GAIT: Distance walked: Into clinic Assistive device utilized: None Level of assistance: Complete Independence Comments: Reciprocal pattern   TODAY'S TREATMENT:                                                                                                                              DATE: 02/28/23 See HEP below    PATIENT EDUCATION:  Education details: Exam findings, POC, initial HEP Person educated: Patient Education method: Explanation, Demonstration, and Handouts Education comprehension: verbalized understanding, returned demonstration, and needs further education  HOME EXERCISE PROGRAM: Access Code: [email or web address] URL: [email or web address] Date: 02/28/2023 Prepared by: Vernon Prey April Kirstie Peri  Exercises - Supine Hip Adductor Stretch  - 1 x daily - 7 x weekly - 2 sets - 30 sec hold - Seated Hip Adductor Stretch  - 1 x daily - 7 x weekly - 2 sets - 30 sec hold - Straight Leg Raise with External  Rotation  - 1 x daily - 7 x weekly - 2 sets - 10 reps - Supine Straight Leg Raise with Internal Rotation  - 1 x daily - 7 x weekly - 2 sets - 10 reps - Standing Hip Abduction/adduction on Slider  - 1 x daily - 7 x weekly - 2 sets - 10 reps  ASSESSMENT:  CLINICAL IMPRESSION: Patient is a 67 y.o. M who was seen today for physical therapy evaluation and treatment for R medial knee pain s/p injury ~1 month ago. Assessment significant for R>L LE weakness, pain, and decreased stability. Pain is pin pointed to his R medial knee close to the joint line. Pain was primarily induced when performing seated knee extension with R foot plantarflexion + inversion, rolling to the right in bed with sidelying R adduction/internal rotation. No notable pain with MCL and LCL ligamentous testing, quad/hamstring activation, and single leg standing. Pt to get MRI and needs PT first prior to getting approval. Pt will benefit from PT to improve his level of strength and stability for improved tolerance to activities such as bed mobility, transfers, and safe community mobility.   OBJECTIVE IMPAIRMENTS: decreased activity tolerance, decreased balance, decreased endurance, decreased mobility, decreased strength, increased fascial restrictions, increased muscle spasms, improper body mechanics, and pain.   ACTIVITY LIMITATIONS: standing, squatting, transfers, bed mobility, and locomotion level  PARTICIPATION LIMITATIONS: cleaning, community activity, and yard work  PERSONAL FACTORS: Age, Fitness, Past/current experiences, and Time since onset of injury/illness/exacerbation are also affecting patient's functional outcome.   REHAB POTENTIAL: Good  CLINICAL DECISION MAKING: Stable/uncomplicated  EVALUATION COMPLEXITY: Low   GOALS: Goals reviewed with patient? Yes  SHORT TERM GOALS: Target date: 03/21/2023  Pt will be ind with initial HEP Baseline: Goal status: INITIAL  LONG TERM GOALS: Target date: 04/11/2023   Pt  will be ind with management and  progression of HEP Baseline:  Goal status: INITIAL  2.  Pt will be able to demo 5/5 strength during MMT for improved R LE stability Baseline:  Goal status: INITIAL  3.  Pt will be able to maintain >/= 15 sec of R SLS to demo improving R LE stability Baseline:  Goal status: INITIAL  4.  Pt will have improved FOTO score to >/=71 Baseline:  Goal status: INITIAL  PLAN:  PT FREQUENCY: 1x/week  PT DURATION: 6 weeks  PLANNED INTERVENTIONS: Therapeutic exercises, Therapeutic activity, Neuromuscular re-education, Balance training, Gait training, Patient/Family education, Self Care, Joint mobilization, Stair training, Aquatic Therapy, Dry Needling, Electrical stimulation, Cryotherapy, Moist heat, Taping, Ultrasound, Ionotophoresis 4mg /ml Dexamethasone, Manual therapy, and Re-evaluation  PLAN FOR NEXT SESSION: Assess response to HEP. Work on progressing adductor/internal rotation/ankle inversion strengthening/stabilization. Consider taping for added stability.    Ronna Herskowitz April Ma L Aarit Kashuba, PT 02/28/2023, 9:23 AM

## 2023-03-06 ENCOUNTER — Encounter (HOSPITAL_COMMUNITY): Payer: Medicare HMO

## 2023-03-13 ENCOUNTER — Encounter (HOSPITAL_COMMUNITY): Payer: Self-pay

## 2023-03-13 ENCOUNTER — Ambulatory Visit (HOSPITAL_COMMUNITY): Payer: Medicare HMO | Attending: Orthopedic Surgery

## 2023-03-13 DIAGNOSIS — M6281 Muscle weakness (generalized): Secondary | ICD-10-CM | POA: Diagnosis not present

## 2023-03-13 DIAGNOSIS — M25562 Pain in left knee: Secondary | ICD-10-CM | POA: Diagnosis not present

## 2023-03-13 DIAGNOSIS — R2689 Other abnormalities of gait and mobility: Secondary | ICD-10-CM | POA: Diagnosis not present

## 2023-03-13 NOTE — Therapy (Signed)
OUTPATIENT PHYSICAL THERAPY LOWER EXTREMITY TREATMENT   Patient Name: Austin Morgan MRN: [id number] DOB:November 14, 1955, 67 y.o., male Today's Date: 03/13/2023  END OF SESSION:  PT End of Session - 03/13/23 1347     Visit Number 2    Number of Visits 6    Date for PT Re-Evaluation 04/11/23    Authorization Type [employer]    Progress Note Due on Visit 10    PT Start Time 1349    PT Stop Time 1430    PT Time Calculation (min) 41 min    Activity Tolerance Patient tolerated treatment well    Behavior During Therapy [facility] for tasks assessed/performed             Past Medical History:  Diagnosis Date   Anxiety    Arthritis    Cirrhosis (HCC)    Depression    Diabetes mellitus without complication (HCC)    ED (erectile dysfunction)    Gout    H/O ETOH abuse    in remission   Hyperlipidemia    Hypertension    Hypertriglyceridemia    Iron deficiency anemia due to chronic blood loss 12/20/2021   PONV (postoperative nausea and vomiting)    Sleep apnea    Past Surgical History:  Procedure Laterality Date   CARPAL TUNNEL RELEASE Bilateral    COLONOSCOPY  2010   Dr. Lina Sar: mild diverticulosis, next colonoscopy 2020   COLONOSCOPY WITH PROPOFOL N/A 01/21/2020   Rourk: 9 polyps removed, multiple tubular adenomas.  Next colonoscopy in 3 years.   ELBOW SURGERY     ESOPHAGEAL BANDING  09/28/2021   Procedure: ESOPHAGEAL BANDING;  Surgeon: Corbin Ade, MD;  [facility]: [facility];  Service: Endoscopy;;   ESOPHAGOGASTRODUODENOSCOPY (EGD) WITH PROPOFOL N/A 01/21/2020   Rourk: normal   ESOPHAGOGASTRODUODENOSCOPY (EGD) WITH PROPOFOL N/A 09/28/2021   Procedure: ESOPHAGOGASTRODUODENOSCOPY (EGD) WITH PROPOFOL;  Surgeon: Corbin Ade, MD;  [facility]: [facility];  Service: Endoscopy;  Laterality: N/A;  3:00pm   ESOPHAGOGASTRODUODENOSCOPY (EGD) WITH PROPOFOL N/A 10/31/2021   Procedure: ESOPHAGOGASTRODUODENOSCOPY (EGD) WITH PROPOFOL;  Surgeon: Dolores Frame, MD;   [facility]: [facility];  Service: Gastroenterology;  Laterality: N/A;   ESOPHAGOGASTRODUODENOSCOPY (EGD) WITH PROPOFOL N/A 05/23/2022   Procedure: ESOPHAGOGASTRODUODENOSCOPY (EGD) WITH PROPOFOL;  Surgeon: Corbin Ade, MD;  [facility]: [facility];  Service: Endoscopy;  Laterality: N/A;  8:30am, asa 3   NECK SURGERY     c4   POLYPECTOMY  01/21/2020   Procedure: POLYPECTOMY;  Surgeon: Corbin Ade, MD;  Location: [facility];  Service: Endoscopy;;  colon   Patient Active Problem List   Diagnosis Date Noted   Chronic diastolic (congestive) heart failure (HCC) 07/23/2022   Thrombocytopenia (HCC) 07/23/2022   Hx of adenomatous colonic polyps 06/18/2022   Hepatic encephalopathy (HCC) 02/25/2022   Hypokalemia 02/25/2022   Wheeze 02/25/2022   Alcoholic cirrhosis (HCC) 01/05/2022   Splenomegaly 12/20/2021   Iron deficiency anemia due to chronic blood loss 12/20/2021   Encephalopathy, hepatic (HCC) 12/18/2021   Diabetes mellitus without complication (HCC) 11/09/2021   Hypertension 11/09/2021   Chronic kidney disease, stage 3b (HCC) 11/01/2021   Volume overload 10/30/2021   Hypervolemia 10/30/2021   Edema 09/22/2021   Other pancytopenia (HCC) 09/20/2021   Acute renal failure syndrome (HCC) 09/20/2021   Cirrhosis of liver (HCC) 04/19/2021   Alkaline phosphatase raised 05/17/2020   Gastroesophageal reflux disease 10/14/2019   Constipation 10/14/2019   Colon cancer screening 10/14/2019   Macrocytic anemia  09/19/2019   Chronic alcoholism in remission (HCC) 09/18/2019   Disturbance in sleep behavior 09/18/2019   Restless leg syndrome 04/17/2019   Elevated liver enzymes 03/20/2019   Obstructive sleep apnea 02/26/2017   Erectile dysfunction 07/13/2013   Hyperlipidemia associated with type 2 diabetes mellitus (HCC) 09/26/2012    PCP: Babs Sciara, MD  REFERRING PROVIDER: Vickki Hearing, MD  REFERRING DIAG: [phone] (ICD-10-CM) - Acute pain of right knee  THERAPY DIAG:   Acute pain of left knee  Muscle weakness (generalized)  Other abnormalities of gait and mobility  Rationale for Evaluation and Treatment: Rehabilitation  ONSET DATE: ~1 month ago  SUBJECTIVE:   SUBJECTIVE STATEMENT: 03/13/23:  Reports of periodic sharp pains and feels like the knee is going to give and fall.  No reports of recent fall.  Pain is like a dull ache.  Stated pain is no long pin point but feels pain under knee cap itself.    Pt states he mowed his son's yard and stepped off the lawnmower wrong. Pt hurt his medial right knee. Pt is able to walk on it. Pt can't pivot on his right leg. Pt can feel it at rest but not hurting. When he does get a pain it feels like his knee is going to give out. Pt reports swelling is better now. Pt states he has to go through therapy before insurance will approve MRI. Reports MD thinks he might have a meniscal tear.  PERTINENT HISTORY: From H&P: stepped off a platform about 4 weeks ago complains of right knee pain with acute onset. His knee extended and externally rotated and popped  PAIN:  Are you having pain? Yes: NPRS scale: 1 currently; at worst 8 /10 Pain location: Medial R knee Pain description: Can be sharp Aggravating factors: Pointing toe, twisting on R leg Relieving factors: rest  PRECAUTIONS: None  RED FLAGS: None   WEIGHT BEARING RESTRICTIONS: No  FALLS:  Has patient fallen in last 6 months? Yes. 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  Subtle possible joint space narrowing without osteophytes   Impression grade 1 OA of  the knee  PATIENT SURVEYS:  FOTO 66; predicted 71  COGNITION: Overall cognitive status: Within functional limits for tasks assessed     SENSATION: WFL  EDEMA:  None noted  MUSCLE LENGTH: Hamstrings: Right 80 deg; Left 80 deg Thomas test: WNL  POSTURE: No Significant postural limitations  PALPATION: Medial knee tenderness along adductor   LOWER EXTREMITY ROM:  Active ROM Right eval Left eval  Hip flexion    Hip extension    Hip abduction    Hip adduction    Hip internal rotation    Hip external rotation    Knee flexion    Knee extension    Ankle dorsiflexion    Ankle plantarflexion    Ankle inversion    Ankle eversion     (Blank rows = not tested)  LOWER EXTREMITY MMT:  MMT Right eval Left eval  Hip flexion 5 5  Hip extension 4+ 4+  Hip abduction 4 4  Hip adduction 3+ *  in supine (medial knee pain)   Hip internal rotation 5 5  Hip external rotation 4 5  Knee flexion 5 5  Knee extension 5 5  Ankle dorsiflexion 5 5  Ankle plantarflexion    Ankle inversion 4* (medial knee pain) 5  Ankle eversion 5 5   (Blank rows = not tested; * = pain)  LOWER EXTREMITY SPECIAL TESTS:  Knee special tests: McMurray's test: negative and lateral ligament (-)  FUNCTIONAL TESTS:  L SLS: 28.94 sec; R SLS: 7.71 sec  GAIT: Distance walked: Into clinic Assistive device utilized: None Level of assistance: Complete Independence Comments: Reciprocal pattern   TODAY'S TREATMENT:                                                                                                                              DATE:  03/13/23: Reviewed goals Educated importance of HEP compliance for maximal benefits Pt able to recall and demonstrate appropraite mechanics with current HEP Seated: - Seated Hip Adductor Stretch  - 1 x daily - 7 x weekly - 2 sets - 30 sec hold - Ball squeeze 10x5" Supine:   -Bridge with adductor squeeze 10x 5" -SLR with IR -SLR with ER -Adductor stretch 2x  30" Sidelying: -Adductor 10x  -Abduction 10x Standing: - Standing Hip Abduction/adduction on Slider  - 1 x daily - 7 x weekly - 2 sets - 10 reps  02/28/23 See HEP below    PATIENT EDUCATION:  Education details: Exam findings, POC, initial HEP Person educated: Patient Education method: Explanation, Demonstration, and Handouts Education comprehension: verbalized understanding, returned demonstration, and needs further education  HOME EXERCISE PROGRAM: Access Code: [email or web address] URL: [email or web address] Date: 02/28/2023 Prepared by: Vernon Prey April Kirstie Peri  Exercises - Supine Hip Adductor Stretch  - 1 x daily - 7 x weekly - 2 sets - 30 sec hold - Seated Hip Adductor Stretch  - 1 x daily - 7 x weekly - 2 sets - 30 sec hold - Straight Leg Raise with External Rotation  - 1 x daily - 7 x weekly - 2 sets - 10 reps - Supine Straight Leg Raise with Internal Rotation  - 1 x daily - 7 x weekly - 2 sets - 10 reps - Standing Hip Abduction/adduction on Slider  - 1 x daily - 7 x weekly - 2 sets - 10 reps  ASSESSMENT:  CLINICAL IMPRESSION: 03/13/23:  Reviewed goals and educated importance of HEP compliance for maximal benefits.  Pt able to recall some of the exercises, therapist reviewed all and given new printout of exercises.  Session focus with specific strengthening of adductor and internal rotation of hips as well as inversion motion of ankle.  Pt able to complete all exercises with min cueing for form and mechanics with no reports of pain through session.  Eval:  Patient is a 67 y.o. M who was seen today for physical therapy evaluation and treatment for R medial knee pain s/p  injury ~1 month ago. Assessment significant for R>L LE weakness, pain, and decreased stability. Pain is pin pointed to his R medial knee close to the joint line. Pain was primarily induced when performing seated knee extension with R foot plantarflexion + inversion, rolling to the right in bed with sidelying R  adduction/internal rotation. No notable pain with MCL and LCL ligamentous testing, quad/hamstring activation, and single leg standing. Pt to get MRI and needs PT first prior to getting approval. Pt will benefit from PT to improve his level of strength and stability for improved tolerance to activities such as bed mobility, transfers, and safe community mobility.   OBJECTIVE IMPAIRMENTS: decreased activity tolerance, decreased balance, decreased endurance, decreased mobility, decreased strength, increased fascial restrictions, increased muscle spasms, improper body mechanics, and pain.   ACTIVITY LIMITATIONS: standing, squatting, transfers, bed mobility, and locomotion level  PARTICIPATION LIMITATIONS: cleaning, community activity, and yard work  PERSONAL FACTORS: Age, Fitness, Past/current experiences, and Time since onset of injury/illness/exacerbation are also affecting patient's functional outcome.   REHAB POTENTIAL: Good  CLINICAL DECISION MAKING: Stable/uncomplicated  EVALUATION COMPLEXITY: Low   GOALS: Goals reviewed with patient? Yes  SHORT TERM GOALS: Target date: 03/21/2023  Pt will be ind with initial HEP Baseline: Goal status: IN PROGRESS  LONG TERM GOALS: Target date: 04/11/2023   Pt will be ind with management and progression of HEP Baseline:  Goal status: IN PROGRESS  2.  Pt will be able to demo 5/5 strength during MMT for improved R LE stability Baseline:  Goal status: IN PROGRESS  3.  Pt will be able to maintain >/= 15 sec of R SLS to demo improving R LE stability Baseline:  Goal status:IN PROGRESS  4.  Pt will have improved FOTO score to >/=71 Baseline:  Goal status: IN PROGRESS  PLAN:  PT FREQUENCY: 1x/week  PT DURATION: 6 weeks  PLANNED INTERVENTIONS: Therapeutic exercises, Therapeutic activity, Neuromuscular re-education, Balance training, Gait training, Patient/Family education, Self Care, Joint mobilization, Stair training, Aquatic Therapy, Dry  Needling, Electrical stimulation, Cryotherapy, Moist heat, Taping, Ultrasound, Ionotophoresis 4mg /ml Dexamethasone, Manual therapy, and Re-evaluation  PLAN FOR NEXT SESSION: Assess response to HEP. Work on progressing adductor/internal rotation/ankle inversion strengthening/stabilization. Consider taping for added stability.   Becky Sax, LPTA/CLT; CBIS [phone]  Juel Burrow, PTA 03/13/2023, 2:44 PM

## 2023-03-19 ENCOUNTER — Inpatient Hospital Stay: Payer: Medicare HMO | Attending: Hematology

## 2023-03-19 DIAGNOSIS — D500 Iron deficiency anemia secondary to blood loss (chronic): Secondary | ICD-10-CM | POA: Insufficient documentation

## 2023-03-19 DIAGNOSIS — Z87891 Personal history of nicotine dependence: Secondary | ICD-10-CM | POA: Insufficient documentation

## 2023-03-19 DIAGNOSIS — R778 Other specified abnormalities of plasma proteins: Secondary | ICD-10-CM | POA: Diagnosis not present

## 2023-03-19 DIAGNOSIS — D696 Thrombocytopenia, unspecified: Secondary | ICD-10-CM | POA: Insufficient documentation

## 2023-03-19 DIAGNOSIS — D631 Anemia in chronic kidney disease: Secondary | ICD-10-CM | POA: Insufficient documentation

## 2023-03-19 DIAGNOSIS — N1832 Chronic kidney disease, stage 3b: Secondary | ICD-10-CM | POA: Diagnosis not present

## 2023-03-19 DIAGNOSIS — D61818 Other pancytopenia: Secondary | ICD-10-CM

## 2023-03-19 LAB — COMPREHENSIVE METABOLIC PANEL
ALT: 27 U/L (ref 0–44)
AST: 27 U/L (ref 15–41)
Albumin: 4.2 g/dL (ref 3.5–5.0)
Alkaline Phosphatase: 93 U/L (ref 38–126)
Anion gap: 8 (ref 5–15)
BUN: 37 mg/dL — ABNORMAL HIGH (ref 8–23)
CO2: 27 mmol/L (ref 22–32)
Calcium: 9.3 mg/dL (ref 8.9–10.3)
Chloride: 100 mmol/L (ref 98–111)
Creatinine, Ser: 1.98 mg/dL — ABNORMAL HIGH (ref 0.61–1.24)
GFR, Estimated: 37 mL/min — ABNORMAL LOW (ref >60)
Glucose, Bld: 102 mg/dL — ABNORMAL HIGH (ref 70–99)
Potassium: 4.2 mmol/L (ref 3.5–5.1)
Sodium: 135 mmol/L (ref 135–145)
Total Bilirubin: 1.5 mg/dL — ABNORMAL HIGH (ref 0.3–1.2)
Total Protein: 7.9 g/dL (ref 6.5–8.1)

## 2023-03-19 LAB — CBC WITH DIFFERENTIAL/PLATELET
Abs Immature Granulocytes: 0.02 10*3/uL (ref 0.00–0.07)
Basophils Absolute: 0 10*3/uL (ref 0.0–0.1)
Basophils Relative: 0 %
Eosinophils Absolute: 0.3 10*3/uL (ref 0.0–0.5)
Eosinophils Relative: 5 %
HCT: 38 % — ABNORMAL LOW (ref 39.0–52.0)
Hemoglobin: 13.4 g/dL (ref 13.0–17.0)
Immature Granulocytes: 0 %
Lymphocytes Relative: 17 %
Lymphs Abs: 1.2 10*3/uL (ref 0.7–4.0)
MCH: 32.9 pg (ref 26.0–34.0)
MCHC: 35.3 g/dL (ref 30.0–36.0)
MCV: 93.4 fL (ref 80.0–100.0)
Monocytes Absolute: 0.7 10*3/uL (ref 0.1–1.0)
Monocytes Relative: 9 %
Neutro Abs: 5 10*3/uL (ref 1.7–7.7)
Neutrophils Relative %: 69 %
Platelets: 106 10*3/uL — ABNORMAL LOW (ref 150–400)
RBC: 4.07 MIL/uL — ABNORMAL LOW (ref 4.22–5.81)
RDW: 12.5 % (ref 11.5–15.5)
WBC: 7.3 10*3/uL (ref 4.0–10.5)
nRBC: 0 % (ref 0.0–0.2)

## 2023-03-19 LAB — IRON AND TIBC
Iron: 105 ug/dL (ref 45–182)
Saturation Ratios: 32 % (ref 17.9–39.5)
TIBC: 325 ug/dL (ref 250–450)
UIBC: 220 ug/dL

## 2023-03-19 LAB — FERRITIN: Ferritin: 119 ng/mL (ref 24–336)

## 2023-03-19 LAB — VITAMIN B12: Vitamin B-12: 1078 pg/mL — ABNORMAL HIGH (ref 180–914)

## 2023-03-20 ENCOUNTER — Telehealth (HOSPITAL_COMMUNITY): Payer: Self-pay

## 2023-03-20 ENCOUNTER — Encounter (HOSPITAL_COMMUNITY): Payer: Medicare HMO

## 2023-03-20 NOTE — Telephone Encounter (Signed)
Pt did not arrive for scheduled appointment. No telephone call nor message preceeded this absence. Author attempted to contact pt via telephone number listed in chart. Pt reports he forgot about this appointment in the context of other IAD[facility] this date. He reports his knee to feel back to normal and sees no need to return to PT at this time and would like to discharge from our services at this time.    Rosamaria Lints, PT Physical Therapist [facility] [place]  [phone]

## 2023-03-21 LAB — METHYLMALONIC ACID, SERUM: Methylmalonic Acid, Quantitative: 340 nmol/L (ref 0–378)

## 2023-03-28 ENCOUNTER — Inpatient Hospital Stay: Payer: Medicare HMO

## 2023-03-29 NOTE — Patient Instructions (Signed)
Austin Morgan  03/29/2023     @[email or web address] @   Your procedure is scheduled on  04/03/2023.   Report to [facility] at  0745  A.M.   Call this number if you have problems the morning of surgery:  [phone]  If you experience any cold or flu symptoms such as cough, fever, chills, shortness of breath, etc. between now and your scheduled surgery, please notify us at the above number.   Remember:  Follow the diet and prep instructions given to you by the office.   You may drink clear liquids until  0545 am on 04/03/2023.     Clear liquids allowed are:                    Water, Juice (No red color; non-citric and without pulp; diabetics please choose diet or no sugar options), Carbonated beverages (diabetics please choose diet or no sugar options), Clear Tea (No creamer, milk, or cream, including half & half and powdered creamer), Black Coffee Only (No creamer, milk or cream, including half & half and powdered creamer), and Clear Sports drink (No red color; diabetics please choose diet or no sugar options)      Take 1/2 of your usual night time insulin the night before your procedure.      DO NOT take any medications for diabetes the morning of your procedure.     Use your inhaler before your come and bring your rescue inhaler with you.     Take these medicines the morning of surgery with A SIP OF WATER           zofran (if needed), pantoprazole, xifaxan.     Do not wear jewelry, make-up or nail polish, including gel polish,  artificial nails, or any other type of covering on natural nails (fingers and  toes).  Do not wear lotions, powders, or perfumes, or deodorant.  Do not shave 48 hours prior to surgery.  Men may shave face and neck.  Do not bring valuables to the hospital.  [facility] is not responsible for any belongings or valuables.  Contacts, dentures or bridgework may not be worn into surgery.  Leave your suitcase in the car.  After surgery it may be  brought to your room.  For patients admitted to the hospital, discharge time will be determined by your treatment team.  Patients discharged the day of surgery will not be allowed to drive home and must have someone with them for 24 hours.    Special instructions:   DO NOT smoke tobacco or vape for 24 hours before your procedure.  Please read over the following fact sheets that you were given. Anesthesia Post-op Instructions and Care and Recovery After Surgery      Colonoscopy, Adult, Care After The following information offers guidance on how to care for yourself after your procedure. Your health care provider may also give you more specific instructions. If you have problems or questions, contact your health care provider. What can I expect after the procedure? After the procedure, it is common to have: A small amount of blood in your stool for 24 hours after the procedure. Some gas. Mild cramping or bloating of your abdomen. Follow these instructions at home: Eating and drinking  Drink enough fluid to keep your urine pale yellow. Follow instructions from your health care provider about eating or drinking restrictions. Resume your normal diet as told by your health care provider. Avoid  heavy or fried foods that are hard to digest. Activity Rest as told by your health care provider. Avoid sitting for a long time without moving. Get up to take short walks every 1-2 hours. This is important to improve blood flow and breathing. Ask for help if you feel weak or unsteady. Return to your normal activities as told by your health care provider. Ask your health care provider what activities are safe for you. Managing cramping and bloating  Try walking around when you have cramps or feel bloated. If directed, apply heat to your abdomen as told by your health care provider. Use the heat source that your health care provider recommends, such as a moist heat pack or a heating pad. Place a  towel between your skin and the heat source. Leave the heat on for 20-30 minutes. Remove the heat if your skin turns bright red. This is especially important if you are unable to feel pain, heat, or cold. You have a greater risk of getting burned. General instructions If you were given a sedative during the procedure, it can affect you for several hours. Do not drive or operate machinery until your health care provider says that it is safe. For the first 24 hours after the procedure: Do not sign important documents. Do not drink alcohol. Do your regular daily activities at a slower pace than normal. Eat soft foods that are easy to digest. Take over-the-counter and prescription medicines only as told by your health care provider. Keep all follow-up visits. This is important. Contact a health care provider if: You have blood in your stool 2-3 days after the procedure. Get help right away if: You have more than a small spotting of blood in your stool. You have large blood clots in your stool. You have swelling of your abdomen. You have nausea or vomiting. You have a fever. You have increasing pain in your abdomen that is not relieved with medicine. These symptoms may be an emergency. Get help right away. Call 911. Do not wait to see if the symptoms will go away. Do not drive yourself to the hospital. Summary After the procedure, it is common to have a small amount of blood in your stool. You may also have mild cramping and bloating of your abdomen. If you were given a sedative during the procedure, it can affect you for several hours. Do not drive or operate machinery until your health care provider says that it is safe. Get help right away if you have a lot of blood in your stool, nausea or vomiting, a fever, or increased pain in your abdomen. This information is not intended to replace advice given to you by your health care provider. Make sure you discuss any questions you have with your  health care provider. Document Revised: 07/10/2022 Document Reviewed: 01/18/2021 Elsevier Patient Education  2024 [employer] Monitored Anesthesia Care, Care After The following information offers guidance on how to care for yourself after your procedure. Your health care provider may also give you more specific instructions. If you have problems or questions, contact your health care provider. What can I expect after the procedure? After the procedure, it is common to have: Tiredness. Little or no memory about what happened during or after the procedure. Impaired judgment when it comes to making decisions. Nausea or vomiting. Some trouble with balance. Follow these instructions at home: For the time period you were told by your health care provider:  Rest. Do not participate in activities  where you could fall or become injured. Do not drive or use machinery. Do not drink alcohol. Do not take sleeping pills or medicines that cause drowsiness. Do not make important decisions or sign legal documents. Do not take care of children on your own. Medicines Take over-the-counter and prescription medicines only as told by your health care provider. If you were prescribed antibiotics, take them as told by your health care provider. Do not stop using the antibiotic even if you start to feel better. Eating and drinking Follow instructions from your health care provider about what you may eat and drink. Drink enough fluid to keep your urine pale yellow. If you vomit: Drink clear fluids slowly and in small amounts as you are able. Clear fluids include water, ice chips, low-calorie sports drinks, and fruit juice that has water added to it (diluted fruit juice). Eat light and bland foods in small amounts as you are able. These foods include bananas, applesauce, rice, lean meats, toast, and crackers. General instructions  Have a responsible adult stay with you for the time you are told. It is  important to have someone help care for you until you are awake and alert. If you have sleep apnea, surgery and some medicines can increase your risk for breathing problems. Follow instructions from your health care provider about wearing your sleep device: When you are sleeping. This includes during daytime naps. While taking prescription pain medicines, sleeping medicines, or medicines that make you drowsy. Do not use any products that contain nicotine or tobacco. These products include cigarettes, chewing tobacco, and vaping devices, such as e-cigarettes. If you need help quitting, ask your health care provider. Contact a health care provider if: You feel nauseous or vomit every time you eat or drink. You feel light-headed. You are still sleepy or having trouble with balance after 24 hours. You get a rash. You have a fever. You have redness or swelling around the IV site. Get help right away if: You have trouble breathing. You have new confusion after you get home. These symptoms may be an emergency. Get help right away. Call 911. Do not wait to see if the symptoms will go away. Do not drive yourself to the hospital. This information is not intended to replace advice given to you by your health care provider. Make sure you discuss any questions you have with your health care provider. Document Revised: 10/23/2021 Document Reviewed: 10/23/2021 Elsevier Patient Education  2024 [employer].

## 2023-03-30 ENCOUNTER — Other Ambulatory Visit: Payer: Self-pay | Admitting: Family Medicine

## 2023-03-30 DIAGNOSIS — E119 Type 2 diabetes mellitus without complications: Secondary | ICD-10-CM

## 2023-04-01 ENCOUNTER — Encounter (HOSPITAL_COMMUNITY)
Admission: RE | Admit: 2023-04-01 | Discharge: 2023-04-01 | Disposition: A | Discharge status: Home / Self Care | Payer: Medicare HMO | Source: Ambulatory Visit | Attending: Internal Medicine

## 2023-04-01 ENCOUNTER — Encounter (HOSPITAL_COMMUNITY): Payer: Self-pay

## 2023-04-01 VITALS — BP 110/69 | HR 64 | Temp 98.0°F | Ht 66.0 in | Wt 202.0 lb

## 2023-04-01 DIAGNOSIS — K7031 Alcoholic cirrhosis of liver with ascites: Secondary | ICD-10-CM | POA: Insufficient documentation

## 2023-04-01 DIAGNOSIS — Z01818 Encounter for other preprocedural examination: Secondary | ICD-10-CM | POA: Diagnosis not present

## 2023-04-01 DIAGNOSIS — E119 Type 2 diabetes mellitus without complications: Secondary | ICD-10-CM | POA: Diagnosis not present

## 2023-04-01 DIAGNOSIS — K7030 Alcoholic cirrhosis of liver without ascites: Secondary | ICD-10-CM

## 2023-04-01 DIAGNOSIS — I10 Essential (primary) hypertension: Secondary | ICD-10-CM | POA: Diagnosis not present

## 2023-04-01 DIAGNOSIS — K7682 Hepatic encephalopathy: Secondary | ICD-10-CM | POA: Diagnosis not present

## 2023-04-01 HISTORY — DX: Heart failure, unspecified: I50.9

## 2023-04-01 LAB — PROTIME-INR
INR: 1.2 (ref 0.8–1.2)
Prothrombin Time: 15.4 s — ABNORMAL HIGH (ref 11.4–15.2)

## 2023-04-03 ENCOUNTER — Encounter (HOSPITAL_COMMUNITY): Payer: Self-pay | Admitting: Internal Medicine

## 2023-04-03 ENCOUNTER — Ambulatory Visit (HOSPITAL_COMMUNITY)
Admission: RE | Admit: 2023-04-03 | Discharge: 2023-04-03 | Disposition: A | Discharge status: Home / Self Care | Payer: Medicare HMO | Attending: Internal Medicine | Admitting: Internal Medicine

## 2023-04-03 ENCOUNTER — Encounter (HOSPITAL_COMMUNITY)
Admission: RE | Disposition: A | Discharge status: Home / Self Care | Payer: Self-pay | Source: Home / Self Care | Attending: Internal Medicine

## 2023-04-03 ENCOUNTER — Ambulatory Visit (HOSPITAL_BASED_OUTPATIENT_CLINIC_OR_DEPARTMENT_OTHER): Payer: Self-pay | Admitting: Certified Registered Nurse Anesthetist

## 2023-04-03 ENCOUNTER — Ambulatory Visit (HOSPITAL_COMMUNITY): Payer: Self-pay | Admitting: Certified Registered Nurse Anesthetist

## 2023-04-03 DIAGNOSIS — I110 Hypertensive heart disease with heart failure: Secondary | ICD-10-CM | POA: Diagnosis not present

## 2023-04-03 DIAGNOSIS — I080 Rheumatic disorders of both mitral and aortic valves: Secondary | ICD-10-CM | POA: Diagnosis not present

## 2023-04-03 DIAGNOSIS — D128 Benign neoplasm of rectum: Secondary | ICD-10-CM | POA: Diagnosis not present

## 2023-04-03 DIAGNOSIS — D124 Benign neoplasm of descending colon: Secondary | ICD-10-CM | POA: Diagnosis not present

## 2023-04-03 DIAGNOSIS — D122 Benign neoplasm of ascending colon: Secondary | ICD-10-CM | POA: Insufficient documentation

## 2023-04-03 DIAGNOSIS — I509 Heart failure, unspecified: Secondary | ICD-10-CM | POA: Insufficient documentation

## 2023-04-03 DIAGNOSIS — Z1211 Encounter for screening for malignant neoplasm of colon: Secondary | ICD-10-CM

## 2023-04-03 DIAGNOSIS — Z87891 Personal history of nicotine dependence: Secondary | ICD-10-CM | POA: Insufficient documentation

## 2023-04-03 DIAGNOSIS — Z860100 Personal history of colon polyps, unspecified: Secondary | ICD-10-CM | POA: Diagnosis not present

## 2023-04-03 DIAGNOSIS — Z794 Long term (current) use of insulin: Secondary | ICD-10-CM | POA: Insufficient documentation

## 2023-04-03 DIAGNOSIS — K7460 Unspecified cirrhosis of liver: Secondary | ICD-10-CM | POA: Insufficient documentation

## 2023-04-03 DIAGNOSIS — I868 Varicose veins of other specified sites: Secondary | ICD-10-CM

## 2023-04-03 DIAGNOSIS — D126 Benign neoplasm of colon, unspecified: Secondary | ICD-10-CM | POA: Diagnosis not present

## 2023-04-03 DIAGNOSIS — Z09 Encounter for follow-up examination after completed treatment for conditions other than malignant neoplasm: Secondary | ICD-10-CM | POA: Diagnosis not present

## 2023-04-03 DIAGNOSIS — K635 Polyp of colon: Secondary | ICD-10-CM | POA: Diagnosis not present

## 2023-04-03 DIAGNOSIS — K219 Gastro-esophageal reflux disease without esophagitis: Secondary | ICD-10-CM | POA: Insufficient documentation

## 2023-04-03 DIAGNOSIS — Z860101 Personal history of adenomatous and serrated colon polyps: Secondary | ICD-10-CM | POA: Diagnosis not present

## 2023-04-03 DIAGNOSIS — Z7984 Long term (current) use of oral hypoglycemic drugs: Secondary | ICD-10-CM | POA: Insufficient documentation

## 2023-04-03 DIAGNOSIS — I700 Atherosclerosis of aorta: Secondary | ICD-10-CM | POA: Insufficient documentation

## 2023-04-03 DIAGNOSIS — E1151 Type 2 diabetes mellitus with diabetic peripheral angiopathy without gangrene: Secondary | ICD-10-CM | POA: Insufficient documentation

## 2023-04-03 DIAGNOSIS — G4730 Sleep apnea, unspecified: Secondary | ICD-10-CM | POA: Insufficient documentation

## 2023-04-03 DIAGNOSIS — F413 Other mixed anxiety disorders: Secondary | ICD-10-CM | POA: Diagnosis not present

## 2023-04-03 HISTORY — PX: POLYPECTOMY: SHX5525

## 2023-04-03 HISTORY — PX: COLONOSCOPY WITH PROPOFOL: SHX5780

## 2023-04-03 LAB — GLUCOSE, CAPILLARY: Glucose-Capillary: 132 mg/dL — ABNORMAL HIGH (ref 70–99)

## 2023-04-03 SURGERY — COLONOSCOPY WITH PROPOFOL
Anesthesia: General

## 2023-04-03 MED ORDER — PROPOFOL 10 MG/ML IV BOLUS
INTRAVENOUS | Status: DC | PRN
  Administered 2023-04-03: 50 mg via INTRAVENOUS
  Administered 2023-04-03: 100 mg via INTRAVENOUS

## 2023-04-03 MED ORDER — LIDOCAINE HCL (PF) 2 % IJ SOLN
INTRAMUSCULAR | Status: AC
  Filled 2023-04-03: qty 5

## 2023-04-03 MED ORDER — PHENYLEPHRINE 80 MCG/ML (10ML) SYRINGE FOR IV PUSH (FOR BLOOD PRESSURE SUPPORT)
PREFILLED_SYRINGE | INTRAVENOUS | Status: DC | PRN
  Administered 2023-04-03 (×9): 160 ug via INTRAVENOUS

## 2023-04-03 MED ORDER — STERILE WATER FOR IRRIGATION IR SOLN
Status: DC | PRN
  Administered 2023-04-03: 60 mL

## 2023-04-03 MED ORDER — PROPOFOL 500 MG/50ML IV EMUL
INTRAVENOUS | Status: DC | PRN
  Administered 2023-04-03: 150 ug/kg/min via INTRAVENOUS

## 2023-04-03 MED ORDER — LACTATED RINGERS IV SOLN: INTRAVENOUS | Status: DC | PRN

## 2023-04-03 MED ORDER — LIDOCAINE HCL (CARDIAC) PF 100 MG/5ML IV SOSY
PREFILLED_SYRINGE | INTRAVENOUS | Status: DC | PRN
  Administered 2023-04-03: 50 mg via INTRAVENOUS

## 2023-04-03 MED ORDER — PROPOFOL 500 MG/50ML IV EMUL
INTRAVENOUS | Status: AC
  Filled 2023-04-03: qty 50

## 2023-04-03 NOTE — H&P (Signed)
@LOGO @   Primary Care Physician:  Babs Sciara, MD Primary Gastroenterologist:  Dr.   Pre-Procedure History & Physical: HPI:  Austin Morgan is a 67 y.o. male here for surveillance colonoscopy.  History of multiple colonic adenomas removed 3 years ago.  Past Medical History:  Diagnosis Date   Anxiety    Arthritis    CHF (congestive heart failure) (HCC)    Cirrhosis (HCC)    Depression    Diabetes mellitus without complication (HCC)    ED (erectile dysfunction)    Gout    H/O ETOH abuse    in remission   Hyperlipidemia    Hypertension    Hypertriglyceridemia    Iron deficiency anemia due to chronic blood loss 12/20/2021   PONV (postoperative nausea and vomiting)    Sleep apnea     Past Surgical History:  Procedure Laterality Date   CARPAL TUNNEL RELEASE Bilateral    COLONOSCOPY  2010   Dr. Lina Sar: mild diverticulosis, next colonoscopy 2020   COLONOSCOPY WITH PROPOFOL N/A 01/21/2020   Bleu Minerd: 9 polyps removed, multiple tubular adenomas.  Next colonoscopy in 3 years.   ELBOW SURGERY     ESOPHAGEAL BANDING  09/28/2021   Procedure: ESOPHAGEAL BANDING;  Surgeon: Corbin Ade, MD;  [facility]: [facility];  Service: Endoscopy;;   ESOPHAGOGASTRODUODENOSCOPY (EGD) WITH PROPOFOL N/A 01/21/2020   Fredric Slabach: normal   ESOPHAGOGASTRODUODENOSCOPY (EGD) WITH PROPOFOL N/A 09/28/2021   Procedure: ESOPHAGOGASTRODUODENOSCOPY (EGD) WITH PROPOFOL;  Surgeon: Corbin Ade, MD;  Location: [facility];  Service: Endoscopy;  Laterality: N/A;  3:00pm   ESOPHAGOGASTRODUODENOSCOPY (EGD) WITH PROPOFOL N/A 10/31/2021   Procedure: ESOPHAGOGASTRODUODENOSCOPY (EGD) WITH PROPOFOL;  Surgeon: Dolores Frame, MD;  Location: [facility];  Service: Gastroenterology;  Laterality: N/A;   ESOPHAGOGASTRODUODENOSCOPY (EGD) WITH PROPOFOL N/A 05/23/2022   Procedure: ESOPHAGOGASTRODUODENOSCOPY (EGD) WITH PROPOFOL;  Surgeon: Corbin Ade, MD;  Location: [facility];  Service: Endoscopy;   Laterality: N/A;  8:30am, asa 3   NECK SURGERY     c4   POLYPECTOMY  01/21/2020   Procedure: POLYPECTOMY;  Surgeon: Corbin Ade, MD;  Location: [facility];  Service: Endoscopy;;  colon    Prior to Admission medications   Medication Sig Start Date End Date Taking? Authorizing Provider  augmented betamethasone dipropionate (DIPROLENE-AF) 0.05 % cream Apply topically 2 (two) times daily as needed. 12/03/22  Yes [provider]  insulin glargine (LANTUS SOLOSTAR) 100 UNIT/ML Solostar Pen Inject 66 units subcutaneously every evening may titrate up to 80 units 01/28/23  Yes Cook, Jayce G, DO  Multiple Vitamins-Minerals (MULTIVITAMIN WITH MINERALS) tablet Take 1 tablet by mouth daily.   Yes [provider]  nadolol (CORGARD) 40 MG tablet Take 1/2 (one-half) tablet by mouth once daily Patient taking differently: Take 20 mg by mouth at bedtime. 04/17/22  Yes Luking, Scott A, MD  NOVOLOG FLEXPEN 100 UNIT/ML FlexPen INJECT 20 UNITS INTO THE SKIN IN THE MORNING, 10 UNITS AT LUNCH, AND 20 UNITS AT SUPPER 04/03/23  Yes Cook, Jayce G, DO  ondansetron (ZOFRAN) 8 MG tablet TAKE 1 TABLET BY MOUTH THREE TIMES DAILY AS NEEDED FOR NAUSEA 12/15/21  Yes Luking, Scott A, MD  pantoprazole (PROTONIX) 40 MG tablet Take 1 tablet (40 mg total) by mouth 2 (two) times daily. Patient taking differently: Take 40 mg by mouth daily as needed (acid reflux). 09/25/21  Yes Babs Sciara, MD  pramipexole (MIRAPEX) 0.25 MG tablet Take 1 tablet (0.25 mg total) by mouth  at bedtime. 02/25/23  Yes Babs Sciara, MD  rifaximin (XIFAXAN) 550 MG TABS tablet Take 550 mg by mouth 2 (two) times daily.   Yes [provider]  tadalafil (CIALIS) 20 MG tablet TAKE 1/2 TO 1 (ONE-HALF TO ONE) TABLET BY MOUTH EVERY OTHER DAY AS NEEDED FOR  ERECTILE  DYSFUNCTION 01/12/23  Yes Babs Sciara, MD  traZODone (DESYREL) 100 MG tablet One and one half qhs 02/25/23  Yes Luking, Scott A, MD  albuterol (VENTOLIN HFA) 108 (90 Base)  MCG/ACT inhaler INHALE 2 PUFFS BY MOUTH EVERY 6 HOURS AS NEEDED FOR WHEEZING 06/07/22   Luking, Jonna Coup, MD  BD PEN NEEDLE NANO 2ND GEN 32G X 4 MM MISC USE TO ADMINISTER INSULIN 07/25/22   [provider]  Continuous Blood Gluc Receiver (DEXCOM G6 RECEIVER) DEVI 1 Device by Does not apply route continuous. 01/05/22   Babs Sciara, MD  Continuous Blood Gluc Sensor (DEXCOM G6 SENSOR) MISC 1 Units by Does not apply route continuous. Patient taking differently: 1 Units by Does not apply route continuous. G7 01/05/22   Babs Sciara, MD  cyanocobalamin (VITAMIN B12) 500 MCG tablet Take 500 mcg by mouth every Monday, Wednesday, and Friday.    [provider]  diclofenac Sodium (VOLTAREN) 1 % GEL Apply 2 g topically in the morning, at noon, and at bedtime.    [provider]  ferrous sulfate 325 (65 FE) MG EC tablet Take 325 mg by mouth every morning. 11/27/21   [provider]  furosemide (LASIX) 40 MG tablet Take 1 tablet (40 mg total) by mouth 2 (two) times daily. 02/27/22 02/12/23  Shahmehdi, Gemma Payor, MD  magnesium oxide (MAG-OX) 400 (240 Mg) MG tablet Take 400 mg by mouth daily. Patient not taking: Reported on 02/25/2023    [provider]  metolazone (ZAROXOLYN) 5 MG tablet Take 5 mg by mouth every Monday, Wednesday, and Friday. 02/14/22 02/12/23  [provider]  spironolactone (ALDACTONE) 25 MG tablet Take 25 mg by mouth every Monday, Wednesday, and Friday. 11/27/21   [provider]  triamcinolone cream (KENALOG) 0.1 % Apply thin amount bid prn 07/23/22   Babs Sciara, MD    Allergies as of 02/25/2023 - Review Complete 02/25/2023  Allergen Reaction Noted   Codeine Nausea And Vomiting 11/26/2008    Family History  Problem Relation Age of Onset   Heart attack Father    Heart Problems Brother        Had open heart surgery   Colon cancer Neg Hx     Social History   Socioeconomic History   Marital status: Married    Spouse name:  Not on file   Number of children: Not on file   Years of education: Not on file   Highest education level: Not on file  Occupational History   Not on file  Tobacco Use   Smoking status: Former    Current packs/day: 0.00    Average packs/day: 1 pack/day for 36.0 years (36.0 ttl pk-yrs)    Types: Cigarettes    Start date: 03/11/1980    Quit date: 03/11/2016    Years since quitting: 7.0    Passive exposure: Past   Smokeless tobacco: Never  Vaping Use   Vaping status: Never Used  Substance and Sexual Activity   Alcohol use: Not Currently    Comment: drinks a fifth of liquor per week; inpatient rehab 08/2019. denied 10/14/19. No etoh since 03/2021 (confirmed 02/12/23)  Drug use: No   Sexual activity: Yes  Other Topics Concern   Not on file  Social History Narrative   Wife, Johnatan Lyndon NP [facility]   Social Determinants of Health   Financial Resource Strain: Low Risk  (08/24/2022)   Overall Financial Resource Strain (CARDIA)    Difficulty of Paying Living Expenses: Not hard at all  Food Insecurity: No Food Insecurity (08/24/2022)   Hunger Vital Sign    Worried About Running Out of Food in the Last Year: Never true    Ran Out of Food in the Last Year: Never true  Transportation Needs: No Transportation Needs (08/24/2022)   PRAPARE - Administrator, Civil Service (Medical): No    Lack of Transportation (Non-Medical): No  Physical Activity: Inactive (08/24/2022)   Exercise Vital Sign    Days of Exercise per Week: 0 days    Minutes of Exercise per Session: 0 min  Stress: No Stress Concern Present (08/24/2022)   [employer] of Occupational Health - Occupational Stress Questionnaire    Feeling of Stress : Not at all  Social Connections: Socially Integrated (08/24/2022)   Social Connection and Isolation Panel [NHANES]    Frequency of Communication with Friends and Family: More than three times a week    Frequency of Social Gatherings with Friends  and Family: More than three times a week    Attends Religious Services: More than 4 times per year    Active Member of [employer] or Organizations: Yes    Attends Engineer, structural: More than 4 times per year    Marital Status: Married  Catering manager Violence: Not At Risk (08/24/2022)   Humiliation, Afraid, Rape, and Kick questionnaire    Fear of Current or Ex-Partner: No    Emotionally Abused: No    Physically Abused: No    Sexually Abused: No    Review of Systems: See HPI, otherwise negative ROS  Physical Exam: BP 138/67   Pulse 68   Temp 97.7 F (36.5 C) (Oral)   Resp 12   Ht 5\' 6"  (1.676 m)   Wt 91.6 kg   SpO2 100%   BMI 32.60 kg/m  General:   Alert,  Well-developed, well-nourished, pleasant and cooperative in NAD Neck:  Supple; no masses or thyromegaly. No significant cervical adenopathy. Lungs:  Clear throughout to auscultation.   No wheezes, crackles, or rhonchi. No acute distress. Heart:  Regular rate and rhythm; no murmurs, clicks, rubs,  or gallops. Abdomen: Non-distended, normal bowel sounds.  Soft and nontender without appreciable mass or hepatosplenomegaly.  Pulses:  Normal pulses noted. Extremities:  Without clubbing or edema.  Impression/Plan: 67 year old gentleman here for surveillance colonoscopy.  History of multiple colonic adenomas removed previously.  The risks, benefits, limitations, alternatives and imponderables have been reviewed with the patient. Questions have been answered. All parties are agreeable.       Notice: This dictation was prepared with Dragon dictation along with smaller phrase technology. Any transcriptional errors that result from this process are unintentional and may not be corrected upon review.

## 2023-04-03 NOTE — Op Note (Signed)
[facility] Patient Name: Austin Morgan Procedure Date: 04/03/2023 9:38 AM MRN: [id number] Date of Birth: 1955-10-03 Attending MD: Gennette Pac , MD, [id number] CSN: [id number] Age: 67 Admit Type: Outpatient Procedure:                Colonoscopy Indications:              High risk colon cancer surveillance: Personal                            history of colonic polyps Providers:                Gennette Pac, MD, Sheran Fava,                            Zena Amos Referring MD:              Medicines:                Propofol per Anesthesia Complications:            No immediate complications. Estimated Blood Loss:     Estimated blood loss was minimal. Procedure:                Pre-Anesthesia Assessment:                           - Prior to the procedure, a History and Physical                            was performed, and patient medications and                            allergies were reviewed. The patient's tolerance of                            previous anesthesia was also reviewed. The risks                            and benefits of the procedure and the sedation                            options and risks were discussed with the patient.                            All questions were answered, and informed consent                            was obtained. Prior Anticoagulants: The patient has                            taken no anticoagulant or antiplatelet agents. ASA                            Grade Assessment: II - A patient with mild systemic  disease. After reviewing the risks and benefits,                            the patient was deemed in satisfactory condition to                            undergo the procedure.                           After obtaining informed consent, the colonoscope                            was passed under direct vision. Throughout the                            procedure, the patient's  blood pressure, pulse, and                            oxygen saturations were monitored continuously. The                            [id number]) scope was introduced through                            the anus and advanced to the the cecum, identified                            by appendiceal orifice and ileocecal valve. The                            colonoscopy was performed without difficulty. The                            patient tolerated the procedure well. The quality                            of the bowel preparation was adequate. The                            ileocecal valve, appendiceal orifice, and rectum                            were photographed. Scope In: 10:01:12 AM Scope Out: 10:34:11 AM Scope Withdrawal Time: 0 hours 21 minutes 22 seconds  Total Procedure Duration: 0 hours 32 minutes 59 seconds  Findings:      The perianal and digital rectal examinations were normal.      Six sessile polyps were found in the descending colon, mid descending       colon, ascending colon and mid ascending colon. The polyps were 3 to 7       mm in size. These polyps were removed with a cold snare. Resection and       retrieval were complete. Estimated blood loss was minimal.      Six semi-pedunculated polyps were found in the mid rectum, descending  colon and mid descending colon. The polyps were 5 to 10 mm in size.       These polyps were removed with a hot snare. Resection and retrieval were       complete. Estimated blood loss: none.      Patient had a couple of rectal variceal columns distal rectum. No       bleeding stigmata.      The exam was otherwise without abnormality on direct and retroflexion       views. Impression:               - Six 3 to 7 mm polyps in the descending colon, in                            the mid descending colon, in the ascending colon                            and in the mid ascending colon, removed with a cold                             snare. Resected and retrieved.                           - Six 5 to 10 mm polyps in the mid rectum, in the                            descending colon and in the mid descending colon,                            removed with a hot snare. Resected and retrieved.                            Distal rectal varices present.                           - The examination was otherwise normal on direct                            and retroflexion views. Moderate Sedation:      Moderate (conscious) sedation was personally administered by an       anesthesia professional. The following parameters were monitored: oxygen       saturation, heart rate, blood pressure, respiratory rate, EKG, adequacy       of pulmonary ventilation, and response to care. Recommendation:           - Patient has a contact number available for                            emergencies. The signs and symptoms of potential                            delayed complications were discussed with the                            patient. Return to normal activities tomorrow.  Written discharge instructions were provided to the                            patient.                           - Advance diet as tolerated.                           - Continue present medications.                           - Repeat colonoscopy date to be determined after                            pending pathology results are reviewed for                            surveillance.                           - Return to GI office in 3 months. Procedure Code(s):        --- Professional ---                           [phone], Colonoscopy, flexible; with removal of                            tumor(s), polyp(s), or other lesion(s) by snare                            technique Diagnosis Code(s):        --- Professional ---                           Z86.010, Personal history of colonic polyps                           D12.2, Benign neoplasm of  ascending colon                           D12.8, Benign neoplasm of rectum                           D12.4, Benign neoplasm of descending colon CPT copyright 2022 American Medical Association. All rights reserved. The codes documented in this report are preliminary and upon coder review may  be revised to meet current compliance requirements. Gerrit Friends. Brion Hedges, MD Gennette Pac, MD 04/03/2023 10:47:15 AM This report has been signed electronically. Number of Addenda: 0

## 2023-04-03 NOTE — Transfer of Care (Signed)
Immediate Anesthesia Transfer of Care Note  Patient: Austin Morgan  Procedure(s) Performed: COLONOSCOPY WITH PROPOFOL POLYPECTOMY  Patient Location: Short Stay  Anesthesia Type:General  Level of Consciousness: awake, alert , and oriented  Airway & Oxygen Therapy: Patient Spontanous Breathing  Post-op Assessment: Report given to RN, Post -op Vital signs reviewed and stable, Patient moving all extremities X 4, and Patient able to stick tongue midline  Post vital signs: Reviewed and stable  Last Vitals:  Vitals Value Taken Time  BP 102/90   Temp 97.6   Pulse 60   Resp 13   SpO2 100     Last Pain:  Vitals:   04/03/23 0956  TempSrc:   PainSc: 0-No pain      Patients Stated Pain Goal: 7 (04/03/23 0831)  Complications: No notable events documented.

## 2023-04-03 NOTE — Discharge Instructions (Signed)
  Colonoscopy Discharge Instructions  Read the instructions outlined below and refer to this sheet in the next few weeks. These discharge instructions provide you with general information on caring for yourself after you leave the hospital. Your doctor may also give you specific instructions. While your treatment has been planned according to the most current medical practices available, unavoidable complications occasionally occur. If you have any problems or questions after discharge, call Dr. Jena Gauss at [phone]. ACTIVITY You may resume your regular activity, but move at a slower pace for the next 24 hours.  Take frequent rest periods for the next 24 hours.  Walking will help get rid of the air and reduce the bloated feeling in your belly (abdomen).  No driving for 24 hours (because of the medicine (anesthesia) used during the test).   Do not sign any important legal documents or operate any machinery for 24 hours (because of the anesthesia used during the test).  NUTRITION Drink plenty of fluids.  You may resume your normal diet as instructed by your doctor.  Begin with a light meal and progress to your normal diet. Heavy or fried foods are harder to digest and may make you feel sick to your stomach (nauseated).  Avoid alcoholic beverages for 24 hours or as instructed.  MEDICATIONS You may resume your normal medications unless your doctor tells you otherwise.  WHAT YOU CAN EXPECT TODAY Some feelings of bloating in the abdomen.  Passage of more gas than usual.  Spotting of blood in your stool or on the toilet paper.  IF YOU HAD POLYPS REMOVED DURING THE COLONOSCOPY: No aspirin products for 7 days or as instructed.  No alcohol for 7 days or as instructed.  Eat a soft diet for the next 24 hours.  FINDING OUT THE RESULTS OF YOUR TEST Not all test results are available during your visit. If your test results are not back during the visit, make an appointment with your caregiver to find out the  results. Do not assume everything is normal if you have not heard from your caregiver or the medical facility. It is important for you to follow up on all of your test results.  SEEK IMMEDIATE MEDICAL ATTENTION IF: You have more than a spotting of blood in your stool.  Your belly is swollen (abdominal distention).  You are nauseated or vomiting.  You have a temperature over 101.  You have abdominal pain or discomfort that is severe or gets worse throughout the day.      you had a total of 12 polyps removed from your colon today  You do have varicose veins in your rectum as well   you may wipe a small amount of blood with your next bowel movement or 2 but it should taper off   office visit with Tana Coast in 3 months  Further recommendations to follow pending review of pathology report   at patient request, I called Rhonda at [phone]-

## 2023-04-03 NOTE — Anesthesia Procedure Notes (Signed)
Date/Time: 04/03/2023 9:56 AM  Performed by: Cy Blamer, CRNAPre-anesthesia Checklist: Patient identified, Emergency Drugs available, Suction available and Patient being monitored Patient Re-evaluated:Patient Re-evaluated prior to induction Oxygen Delivery Method: Nasal cannula Induction Type: IV induction Placement Confirmation: positive ETCO2

## 2023-04-03 NOTE — Anesthesia Preprocedure Evaluation (Signed)
Anesthesia Evaluation  Patient identified by MRN, date of birth, ID band Patient awake    Reviewed: Allergy & Precautions, NPO status , Patient's Chart, lab work & pertinent test results, reviewed documented beta blocker date and time   History of Anesthesia Complications (+) PONV and history of anesthetic complications  Airway Mallampati: II  TM Distance: >3 FB Neck ROM: Full    Dental  (+) Missing, Dental Advisory Given   Pulmonary sleep apnea , former smoker   Pulmonary exam normal breath sounds clear to auscultation       Cardiovascular hypertension, Pt. on medications and Pt. on home beta blockers + Peripheral Vascular Disease (AAA) and +CHF  Normal cardiovascular exam Rhythm:Regular Rate:Normal  1. Left ventricular ejection fraction, by estimation, is 60 to 65%. The left ventricle has normal function. The left ventricle has no regional wall motion abnormalities. There is mild left ventricular hypertrophy.  Left ventricular diastolic parameters were normal. Elevated left ventricular end-diastolic pressure.   2. Right ventricular systolic function is normal. The right ventricular size is normal. There is normal pulmonary artery systolic pressure. The estimated right ventricular systolic pressure is 34.6 mmHg.   3. Left atrial size was mild to moderately dilated.   4. Right atrial size was moderately dilated.   5. The mitral valve is degenerative, mildly thickened with annular calcification. Mild mitral valve regurgitation.   6. The aortic valve is tricuspid. There is mild calcification of the aortic valve. left coronary cusp is significantly restricted. Aortic valve  regurgitation is trivial. Mild aortic valve stenosis. Aortic valve mean gradient measures 11.0 mmHg.  Dimenionless index 0.60.   7. Aortic dilatation noted. There is mild dilatation of the aortic root, measuring 41 mm.   8. The inferior vena cava is dilated in size  with >50% respiratory variability, suggesting right atrial pressure of 8 mmHg.    Neuro/Psych  PSYCHIATRIC DISORDERS Anxiety Depression     Neuromuscular disease (RLS)    GI/Hepatic ,GERD  Medicated,,(+) Cirrhosis   Esophageal Varices  substance abuse  alcohol use  Endo/Other  diabetes, Well Controlled, Type 2, Insulin Dependent, Oral Hypoglycemic Agents    Renal/GU Renal InsufficiencyRenal disease  negative genitourinary   Musculoskeletal  (+) Arthritis , Osteoarthritis,    Abdominal   Peds negative pediatric ROS (+)  Hematology  (+) Blood dyscrasia, anemia   Anesthesia Other Findings   IMPRESSION: Cardiomegaly, diffuse interstitial and patchy airspace opacities, and small bilateral pleural effusions. Findings are most suggestive of congestive heart failure with mild edema. Infection is not excluded.     Electronically Signed   By: Feliberto Harts M.D.   On: 10/30/2021 16:21    Reproductive/Obstetrics negative OB ROS                              Anesthesia Physical Anesthesia Plan  ASA: 3  Anesthesia Plan: General   Post-op Pain Management: Minimal or no pain anticipated   Induction: Intravenous  PONV Risk Score and Plan: Propofol infusion  Airway Management Planned: Nasal Cannula and Natural Airway  Additional Equipment:   Intra-op Plan:   Post-operative Plan:   Informed Consent: I have reviewed the patients History and Physical, chart, labs and discussed the procedure including the risks, benefits and alternatives for the proposed anesthesia with the patient or authorized representative who has indicated his/her understanding and acceptance.     Dental advisory given  Plan Discussed with: CRNA and Surgeon  Anesthesia  Plan Comments:          Anesthesia Quick Evaluation

## 2023-04-03 NOTE — Anesthesia Postprocedure Evaluation (Signed)
Anesthesia Post Note  Patient: Austin Morgan  Procedure(s) Performed: COLONOSCOPY WITH PROPOFOL POLYPECTOMY  Patient location during evaluation: PACU Anesthesia Type: General Level of consciousness: awake and alert Pain management: pain level controlled Vital Signs Assessment: post-procedure vital signs reviewed and stable Respiratory status: spontaneous breathing, nonlabored ventilation, respiratory function stable and patient connected to nasal cannula oxygen Cardiovascular status: blood pressure returned to baseline and stable Postop Assessment: no apparent nausea or vomiting Anesthetic complications: no   There were no known notable events for this encounter.   Last Vitals:  Vitals:   04/03/23 1041 04/03/23 1045  BP: (!) 102/90 106/69  Pulse: (!) 57   Resp: (!) 9   Temp: 36.4 C   SpO2: 99%     Last Pain:  Vitals:   04/03/23 1041  TempSrc: Oral  PainSc: 0-No pain                 Zulay Corrie L Salem Mastrogiovanni

## 2023-04-04 ENCOUNTER — Ambulatory Visit: Payer: Medicare HMO | Admitting: Orthopedic Surgery

## 2023-04-04 ENCOUNTER — Inpatient Hospital Stay: Payer: Medicare HMO | Admitting: Oncology

## 2023-04-04 VITALS — BP 128/79 | HR 60 | Temp 98.3°F | Resp 18 | Ht 66.0 in | Wt 205.7 lb

## 2023-04-04 DIAGNOSIS — R778 Other specified abnormalities of plasma proteins: Secondary | ICD-10-CM | POA: Diagnosis not present

## 2023-04-04 DIAGNOSIS — D696 Thrombocytopenia, unspecified: Secondary | ICD-10-CM

## 2023-04-04 DIAGNOSIS — R768 Other specified abnormal immunological findings in serum: Secondary | ICD-10-CM | POA: Diagnosis not present

## 2023-04-04 DIAGNOSIS — D500 Iron deficiency anemia secondary to blood loss (chronic): Secondary | ICD-10-CM | POA: Diagnosis not present

## 2023-04-04 DIAGNOSIS — Z87891 Personal history of nicotine dependence: Secondary | ICD-10-CM | POA: Diagnosis not present

## 2023-04-04 DIAGNOSIS — N1832 Chronic kidney disease, stage 3b: Secondary | ICD-10-CM | POA: Diagnosis not present

## 2023-04-04 DIAGNOSIS — D631 Anemia in chronic kidney disease: Secondary | ICD-10-CM | POA: Diagnosis not present

## 2023-04-04 LAB — SURGICAL PATHOLOGY

## 2023-04-04 NOTE — Progress Notes (Signed)
[facility] [address] [place], [place] [address]   CLINIC:  Medical Oncology/Hematology  PCP:  Babs Sciara, MD [address] [place] [address] [phone]   REASON FOR VISIT:  Follow-up for normocytic anemia (iron deficiency + CKD), thrombocytopenia, and leukopenia   CURRENT THERAPY: Intermittent iron infusions  INTERVAL HISTORY:   Mr. Austin Morgan 67 y.o. male returns for routine follow-up of his iron deficiency anemia, anemia of CKD, thrombocytopenia, and leukopenia.  He was last seen by Rojelio Brenner PA-C on 11/26/22.    At today's visit, he reports feeling well.   He has not had any hospitalizations, surgeries, or changes in his baseline health status.  He had a colonoscopy performed by Dr. Jena Gauss yesterday. Had 11 polps removed and sent for path.  Results have not returned yet.   He reports that his energy is stable.  He denies any recurrent ice pica but does continue to have restless leg.  Has occasional headaches and numbness and tingling at times in his fingers and toes.  Has occasional diarrhea.    He received 1 dose of IV Venofer 300 mg on 12/06/2022 with good tolerance.  Has been taken vitamins from [place] and he thinks this has improved his lab work tremendously.  He reports he also is no longer on a statin due to these vitamins.   Patient denies any hematemesis, melena, or hematochezia since his last visit.   He reports occasional easy bruising or rather "thin skin". No frequent infections or B symptoms such as fever, chills, night sweats, unintentional weight loss.  He denies any new bone pain or neurologic changes.  He is taking vitamin B12 three times weekly.  He has 90% energy and 100% appetite. He endorses that he is maintaining a stable weight.  ASSESSMENT & PLAN:  1.  Normocytic anemia secondary to iron deficiency and CKD stage IIIb/IV -  Normocytic anemia since 2021, with significant worsening of his anemia in April 2023  (Hgb 5.6) during admission to hospital for GI bleeding.   - Hematology work-up (12/20/2021): Reticulocytes 1.5% (hypoproliferative in the setting of moderate anemia) LDH normal. Ferritin 31, iron saturation 21%. Normal folate, copper. Normal B12 781 with mildly elevated methylmalonic acid 411. SPEP negative.  Immunofixation shows polyclonal gammopathy. LDH normal.   - Hospitalized in April 2023 for GI bleeding secondary to varices, requiring esophageal variceal banding and PRBC transfusion x 3.  He was hospitalized again in May 2023 with volume overload and symptomatic anemia requiring repeat EGD (10/31/2021) which showed portal gastropathy and grade 1 varices - he received Venofer 300 mg during hospitalization in May. - Follows with [facility] Tana Coast, PA-C and Dr. Jena Gauss) for his alcohol-related liver cirrhosis and GI bleeding. - He has been taking oral iron supplementation since June 2023 - He is taking vitamin B12 500 mcg 3 times weekly - Most recent IV Venofer 300 mg x  1 on 12/06/22. - Receiving Procrit via Dr. Wolfgang Phoenix, but has not required injection since 02/22/2022 due to Hgb >10 - Denies any recent hematemesis, melena, or hematochezia.  2.  Thrombocytopenia and leukopenia (cirrhosis and splenomegaly) - Intermittent mild leukopenia since April 2023.  He has had mild thrombocytopenia since June 2022, with most recent platelets (12/13/2021) at 55.   - Nephrology testing for hepatitis B, hepatitis C, and HIV in June 2023 was negative - Hematology work-up (12/20/2021): Reticulocytes 1.5% (hypoproliferative in the setting of moderate anemia) Normal folate, copper. Normal B12 781  with mildly elevated methylmalonic acid 411. SPEP negative for M spike.  Immunofixation shows polyclonal gammopathy.  LDH normal. - No history of autoimmune or connective tissue disorder. - Patient has liver cirrhosis and splenomegaly - Follows with [facility]  Tana Coast, PA-C and Dr. Jena Gauss) for his alcohol-related liver cirrhosis and GI bleeding. - Most recent abdominal ultrasound (12/07/2020) showed splenomegaly measuring 14 cm with volume 678 cc. - Admits to easy bruising, no petechial rash.  Occasional mild epistaxis.  3.  Elevated free light chains - Labs obtained by nephrologist (11/15/2021) show elevated free serum light chains with elevated kappa 168.3, elevated lambda 92.6, and elevated ratio 1.82. - 24-hour urine (11/15/2021) showed elevated urine light chains with kappa 96.75 and lambda 9.02 with ratio 10.73.  Urine immunofixation showed polyclonal light chains but was negative for monoclonal immunoglobulin or Bence-Jones protein. - Hematology work-up (12/20/2021):  SPEP negative for M spike.  Immunofixation shows polyclonal gammopathy.  Elevated kappa light chains 138.0, elevated lambda 78.5, with elevated ratio 1.76 (in keeping with CKD) - He denies any new bone pain or B symptoms.     4.  Other history - PMH: CKD stage 3b, type 2 diabetes mellitus, anemia of chronic disease, hypertension, alcoholic liver cirrhosis, history of GI bleed from esophageal varices, restless legs, sleep apnea, and chronic diastolic CHF. 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 - SUBSTANCE: History of heavy alcohol use for 20 years, but has been sober since October 2022.  He smoked 0.5 PPD cigarettes for 30 years, but quit in 2017.  He has history of drug use many years ago, but denies any IV drug use. - FAMILY: No family history of blood abnormalities or cancer.   PLAN: 1. Iron deficiency anemia due to chronic blood loss - Differential diagnosis favors iron deficiency anemia secondary to GI blood loss as well as anemia secondary to his CKD stage IIIb. -Most recent labs (03/19/23): Hgb 13.4/MCV 93.4, ferritin 119, iron saturation 32%.  Baseline CKD stage IIIb/IV with creatinine  1.98/GFR 37.  Vitamin B12 1078, MMA has normalized at 340. -Recommend he continue oral iron supplements at this time 4 hours apart from his Protonix. -Recommend decreasing his vitamin B12 500 mcg to 2 times per week given MMA has now normalized and B12 is over thousand. -Recommend labs in 4 months with follow-up a week later.   2. Thrombocytopenia (HCC) - Most recent labs (03/19/23): Platelets 106.  Normal WBC and differential.  Vitamin B12 1078, MMA is normal 340. -Continue vitamin B12 2 times per week. -Will continue to monitor platelet counts every 4 months.  3. Elevated serum immunoglobulin free light chains -Elevated light chains secondary to CKD. -No further workup needed at this time.   PLAN SUMMARY: ** Needs THURSDAY MORNING APPOINTMENTS only ** >> No additional IV iron needed at this time. >> Labs in 4 months = CMC/D, CMP, ferritin, iron/TIBC, B12, MMA >> OFFICE visit in 4 months (1 week after labs)     REVIEW OF SYSTEMS:  Review of Systems  Gastrointestinal:  Positive for diarrhea.  Neurological:  Positive for headaches.     PHYSICAL EXAM:  ECOG PERFORMANCE STATUS: 1 - Symptomatic but completely ambulatory  Vitals:   04/04/23 0939  BP: 128/79  Pulse: 60  Resp: 18  Temp: 98.3 F (36.8 C)  SpO2: 100%   Filed Weights   04/04/23 0939  Weight: 205 lb 11.2 oz (93.3 kg)  Physical Exam Constitutional:      Appearance: Normal appearance.  Cardiovascular:     Rate and Rhythm: Normal rate and regular rhythm.  Pulmonary:     Effort: Pulmonary effort is normal.     Breath sounds: Normal breath sounds.  Abdominal:     General: Bowel sounds are normal.     Palpations: Abdomen is soft.  Musculoskeletal:        General: No swelling. Normal range of motion.  Neurological:     Mental Status: He is alert and oriented to person, place, and time. Mental status is at baseline.     PAST MEDICAL/SURGICAL HISTORY:  Past Medical History:  Diagnosis Date   Anxiety     Arthritis    CHF (congestive heart failure) (HCC)    Cirrhosis (HCC)    Depression    Diabetes mellitus without complication (HCC)    ED (erectile dysfunction)    Gout    H/O ETOH abuse    in remission   Hyperlipidemia    Hypertension    Hypertriglyceridemia    Iron deficiency anemia due to chronic blood loss 12/20/2021   PONV (postoperative nausea and vomiting)    Sleep apnea    Past Surgical History:  Procedure Laterality Date   CARPAL TUNNEL RELEASE Bilateral    COLONOSCOPY  2010   Dr. Lina Sar: mild diverticulosis, next colonoscopy 2020   COLONOSCOPY WITH PROPOFOL N/A 01/21/2020   Rourk: 9 polyps removed, multiple tubular adenomas.  Next colonoscopy in 3 years.   ELBOW SURGERY     ESOPHAGEAL BANDING  09/28/2021   Procedure: ESOPHAGEAL BANDING;  Surgeon: Corbin Ade, MD;  [facility]: [facility];  Service: Endoscopy;;   ESOPHAGOGASTRODUODENOSCOPY (EGD) WITH PROPOFOL N/A 01/21/2020   Rourk: normal   ESOPHAGOGASTRODUODENOSCOPY (EGD) WITH PROPOFOL N/A 09/28/2021   Procedure: ESOPHAGOGASTRODUODENOSCOPY (EGD) WITH PROPOFOL;  Surgeon: Corbin Ade, MD;  [facility]: [facility];  Service: Endoscopy;  Laterality: N/A;  3:00pm   ESOPHAGOGASTRODUODENOSCOPY (EGD) WITH PROPOFOL N/A 10/31/2021   Procedure: ESOPHAGOGASTRODUODENOSCOPY (EGD) WITH PROPOFOL;  Surgeon: Dolores Frame, MD;  [facility]: [facility];  Service: Gastroenterology;  Laterality: N/A;   ESOPHAGOGASTRODUODENOSCOPY (EGD) WITH PROPOFOL N/A 05/23/2022   Procedure: ESOPHAGOGASTRODUODENOSCOPY (EGD) WITH PROPOFOL;  Surgeon: Corbin Ade, MD;  [facility]: [facility];  Service: Endoscopy;  Laterality: N/A;  8:30am, asa 3   NECK SURGERY     c4   POLYPECTOMY  01/21/2020   Procedure: POLYPECTOMY;  Surgeon: Corbin Ade, MD;  [facility]: [facility];  Service: Endoscopy;;  colon    SOCIAL HISTORY:  Social History   Socioeconomic History   Marital status: Married    Spouse name: Not on file   Number  of children: Not on file   Years of education: Not on file   Highest education level: Not on file  Occupational History   Not on file  Tobacco Use   Smoking status: Former    Current packs/day: 0.00    Average packs/day: 1 pack/day for 36.0 years (36.0 ttl pk-yrs)    Types: Cigarettes    Start date: 03/11/1980    Quit date: 03/11/2016    Years since quitting: 7.0    Passive exposure: Past   Smokeless tobacco: Never  Vaping Use   Vaping status: Never Used  Substance and Sexual Activity   Alcohol use: Not Currently    Comment: drinks a fifth of liquor per week; inpatient rehab 08/2019. denied 10/14/19. No etoh since 03/2021 (confirmed  02/12/23)   Drug use: No   Sexual activity: Yes  Other Topics Concern   Not on file  Social History Narrative   Wife, Felice Collins NP [facility] [place]   Social Determinants of Health   Financial Resource Strain: Low Risk  (08/24/2022)   Overall Financial Resource Strain (CARDIA)    Difficulty of Paying Living Expenses: Not hard at all  Food Insecurity: No Food Insecurity (08/24/2022)   Hunger Vital Sign    Worried About Running Out of Food in the Last Year: Never true    Ran Out of Food in the Last Year: Never true  Transportation Needs: No Transportation Needs (08/24/2022)   PRAPARE - Administrator, Civil Service (Medical): No    Lack of Transportation (Non-Medical): No  Physical Activity: Inactive (08/24/2022)   Exercise Vital Sign    Days of Exercise per Week: 0 days    Minutes of Exercise per Session: 0 min  Stress: No Stress Concern Present (08/24/2022)   [employer] of Occupational Health - Occupational Stress Questionnaire    Feeling of Stress : Not at all  Social Connections: Socially Integrated (08/24/2022)   Social Connection and Isolation Panel [NHANES]    Frequency of Communication with Friends and Family: More than three times a week    Frequency of Social Gatherings with Friends and Family: More than  three times a week    Attends Religious Services: More than 4 times per year    Active Member of [employer] or Organizations: Yes    Attends Engineer, structural: More than 4 times per year    Marital Status: Married  Catering manager Violence: Not At Risk (08/24/2022)   Humiliation, Afraid, Rape, and Kick questionnaire    Fear of Current or Ex-Partner: No    Emotionally Abused: No    Physically Abused: No    Sexually Abused: No    FAMILY HISTORY:  Family History  Problem Relation Age of Onset   Heart attack Father    Heart Problems Brother        Had open heart surgery   Colon cancer Neg Hx     CURRENT MEDICATIONS:  Outpatient Encounter Medications as of 04/04/2023  Medication Sig   albuterol (VENTOLIN HFA) 108 (90 Base) MCG/ACT inhaler INHALE 2 PUFFS BY MOUTH EVERY 6 HOURS AS NEEDED FOR WHEEZING   augmented betamethasone dipropionate (DIPROLENE-AF) 0.05 % cream Apply topically 2 (two) times daily as needed.   BD PEN NEEDLE NANO 2ND GEN 32G X 4 MM MISC USE TO ADMINISTER INSULIN   Continuous Blood Gluc Receiver (DEXCOM G6 RECEIVER) DEVI 1 Device by Does not apply route continuous.   Continuous Blood Gluc Sensor (DEXCOM G6 SENSOR) MISC 1 Units by Does not apply route continuous. (Patient taking differently: 1 Units by Does not apply route continuous. G7)   cyanocobalamin (VITAMIN B12) 500 MCG tablet Take 500 mcg by mouth every Monday, Wednesday, and Friday.   diclofenac Sodium (VOLTAREN) 1 % GEL Apply 2 g topically in the morning, at noon, and at bedtime.   ferrous sulfate 325 (65 FE) MG EC tablet Take 325 mg by mouth every morning.   insulin glargine (LANTUS SOLOSTAR) 100 UNIT/ML Solostar Pen Inject 66 units subcutaneously every evening may titrate up to 80 units   magnesium oxide (MAG-OX) 400 (240 Mg) MG tablet Take 400 mg by mouth daily.   Multiple Vitamins-Minerals (MULTIVITAMIN WITH MINERALS) tablet Take 1 tablet by mouth daily.   nadolol (  CORGARD) 40 MG tablet Take 1/2  (one-half) tablet by mouth once daily (Patient taking differently: Take 20 mg by mouth at bedtime.)   NOVOLOG FLEXPEN 100 UNIT/ML FlexPen INJECT 20 UNITS INTO THE SKIN IN THE MORNING, 10 UNITS AT LUNCH, AND 20 UNITS AT SUPPER   ondansetron (ZOFRAN) 8 MG tablet TAKE 1 TABLET BY MOUTH THREE TIMES DAILY AS NEEDED FOR NAUSEA   pantoprazole (PROTONIX) 40 MG tablet Take 1 tablet (40 mg total) by mouth 2 (two) times daily. (Patient taking differently: Take 40 mg by mouth daily as needed (acid reflux).)   pramipexole (MIRAPEX) 0.25 MG tablet Take 1 tablet (0.25 mg total) by mouth at bedtime.   rifaximin (XIFAXAN) 550 MG TABS tablet Take 550 mg by mouth 2 (two) times daily.   spironolactone (ALDACTONE) 25 MG tablet Take 25 mg by mouth every Monday, Wednesday, and Friday.   tadalafil (CIALIS) 20 MG tablet TAKE 1/2 TO 1 (ONE-HALF TO ONE) TABLET BY MOUTH EVERY OTHER DAY AS NEEDED FOR  ERECTILE  DYSFUNCTION   traZODone (DESYREL) 100 MG tablet One and one half qhs   triamcinolone cream (KENALOG) 0.1 % Apply thin amount bid prn   furosemide (LASIX) 40 MG tablet Take 1 tablet (40 mg total) by mouth 2 (two) times daily.   metolazone (ZAROXOLYN) 5 MG tablet Take 5 mg by mouth every Monday, Wednesday, and Friday.   No facility-administered encounter medications on file as of 04/04/2023.    ALLERGIES:  Allergies  Allergen Reactions   Codeine Nausea And Vomiting    LABORATORY DATA:  I have reviewed the labs as listed.  CBC    Component Value Date/Time   WBC 7.3 03/19/2023 1253   RBC 4.07 (L) 03/19/2023 1253   HGB 13.4 03/19/2023 1253   HGB 9.1 (L) 03/02/2022 1158   HCT 38.0 (L) 03/19/2023 1253   HCT 26.8 (L) 03/02/2022 1158   PLT 106 (L) 03/19/2023 1253   PLT 128 (L) 03/02/2022 1158   MCV 93.4 03/19/2023 1253   MCV 91 03/02/2022 1158   MCH 32.9 03/19/2023 1253   MCHC 35.3 03/19/2023 1253   RDW 12.5 03/19/2023 1253   RDW 12.2 03/02/2022 1158   LYMPHSABS 1.2 03/19/2023 1253   LYMPHSABS 1.1  03/02/2022 1158   MONOABS 0.7 03/19/2023 1253   EOSABS 0.3 03/19/2023 1253   EOSABS 0.4 03/02/2022 1158   BASOSABS 0.0 03/19/2023 1253   BASOSABS 0.0 03/02/2022 1158      Latest Ref Rng & Units 03/19/2023   12:53 PM 11/23/2022   12:23 PM 07/20/2022   10:53 AM  CMP  Glucose 70 - 99 mg/dL 161  096  045   BUN 8 - 23 mg/dL 37  48  52   Creatinine 0.61 - 1.24 mg/dL 4.09  8.11  9.14   Sodium 135 - 145 mmol/L 135  137  136   Potassium 3.5 - 5.1 mmol/L 4.2  3.6  3.8   Chloride 98 - 111 mmol/L 100  100  96   CO2 22 - 32 mmol/L 27  26  27    Calcium 8.9 - 10.3 mg/dL 9.3  9.2  9.5   Total Protein 6.5 - 8.1 g/dL 7.9  7.4  8.2   Total Bilirubin 0.3 - 1.2 mg/dL 1.5  1.4  0.9   Alkaline Phos 38 - 126 U/L 93  107  167   AST 15 - 41 U/L 27  26  38   ALT 0 - 44 U/L 27  20  48     DIAGNOSTIC IMAGING:  I have independently reviewed the relevant imaging and discussed with the patient.   WRAP UP:  All questions were answered. The patient knows to call the clinic with any problems, questions or concerns.  Medical decision making: Moderate  Time spent on visit: I spent 20 minutes counseling the patient face to face. The total time spent in the appointment was 30 minutes and more than 50% was on counseling.  Mauro Kaufmann, NP  04/04/23 9:44 AM

## 2023-04-05 ENCOUNTER — Encounter: Payer: Self-pay | Admitting: Hematology

## 2023-04-07 ENCOUNTER — Encounter: Payer: Self-pay | Admitting: Internal Medicine

## 2023-04-10 ENCOUNTER — Encounter (HOSPITAL_COMMUNITY): Payer: Self-pay | Admitting: Internal Medicine

## 2023-04-15 ENCOUNTER — Other Ambulatory Visit (HOSPITAL_COMMUNITY): Payer: Self-pay | Admitting: Nurse Practitioner

## 2023-04-15 DIAGNOSIS — K7030 Alcoholic cirrhosis of liver without ascites: Secondary | ICD-10-CM | POA: Diagnosis not present

## 2023-04-15 DIAGNOSIS — R188 Other ascites: Secondary | ICD-10-CM

## 2023-04-15 DIAGNOSIS — I8510 Secondary esophageal varices without bleeding: Secondary | ICD-10-CM | POA: Diagnosis not present

## 2023-04-15 DIAGNOSIS — K7682 Hepatic encephalopathy: Secondary | ICD-10-CM | POA: Diagnosis not present

## 2023-04-18 ENCOUNTER — Ambulatory Visit (HOSPITAL_COMMUNITY)
Admission: RE | Admit: 2023-04-18 | Discharge: 2023-04-18 | Disposition: A | Discharge status: Home / Self Care | Payer: Medicare HMO | Source: Ambulatory Visit | Attending: Nurse Practitioner | Admitting: Nurse Practitioner

## 2023-04-18 ENCOUNTER — Other Ambulatory Visit (HOSPITAL_COMMUNITY): Payer: Self-pay | Admitting: Nurse Practitioner

## 2023-04-18 DIAGNOSIS — R188 Other ascites: Secondary | ICD-10-CM | POA: Diagnosis not present

## 2023-04-18 DIAGNOSIS — K7030 Alcoholic cirrhosis of liver without ascites: Secondary | ICD-10-CM

## 2023-04-18 DIAGNOSIS — Z0389 Encounter for observation for other suspected diseases and conditions ruled out: Secondary | ICD-10-CM | POA: Diagnosis not present

## 2023-04-28 ENCOUNTER — Other Ambulatory Visit: Payer: Self-pay | Admitting: Family Medicine

## 2023-04-28 DIAGNOSIS — E119 Type 2 diabetes mellitus without complications: Secondary | ICD-10-CM

## 2023-05-11 DIAGNOSIS — E1169 Type 2 diabetes mellitus with other specified complication: Secondary | ICD-10-CM | POA: Diagnosis not present

## 2023-05-13 DIAGNOSIS — I5032 Chronic diastolic (congestive) heart failure: Secondary | ICD-10-CM | POA: Diagnosis not present

## 2023-05-13 DIAGNOSIS — R809 Proteinuria, unspecified: Secondary | ICD-10-CM | POA: Diagnosis not present

## 2023-05-13 DIAGNOSIS — I129 Hypertensive chronic kidney disease with stage 1 through stage 4 chronic kidney disease, or unspecified chronic kidney disease: Secondary | ICD-10-CM | POA: Diagnosis not present

## 2023-05-13 DIAGNOSIS — N189 Chronic kidney disease, unspecified: Secondary | ICD-10-CM | POA: Diagnosis not present

## 2023-05-13 DIAGNOSIS — E1129 Type 2 diabetes mellitus with other diabetic kidney complication: Secondary | ICD-10-CM | POA: Diagnosis not present

## 2023-05-13 DIAGNOSIS — N1832 Chronic kidney disease, stage 3b: Secondary | ICD-10-CM | POA: Diagnosis not present

## 2023-05-13 DIAGNOSIS — D638 Anemia in other chronic diseases classified elsewhere: Secondary | ICD-10-CM | POA: Diagnosis not present

## 2023-05-20 ENCOUNTER — Other Ambulatory Visit: Payer: Self-pay | Admitting: Family Medicine

## 2023-05-20 DIAGNOSIS — E1129 Type 2 diabetes mellitus with other diabetic kidney complication: Secondary | ICD-10-CM | POA: Diagnosis not present

## 2023-05-20 DIAGNOSIS — E1122 Type 2 diabetes mellitus with diabetic chronic kidney disease: Secondary | ICD-10-CM | POA: Diagnosis not present

## 2023-05-20 DIAGNOSIS — R809 Proteinuria, unspecified: Secondary | ICD-10-CM | POA: Diagnosis not present

## 2023-05-20 DIAGNOSIS — I5032 Chronic diastolic (congestive) heart failure: Secondary | ICD-10-CM | POA: Diagnosis not present

## 2023-05-27 ENCOUNTER — Encounter: Payer: Self-pay | Admitting: Gastroenterology

## 2023-05-28 ENCOUNTER — Other Ambulatory Visit: Payer: Self-pay | Admitting: Family Medicine

## 2023-05-28 DIAGNOSIS — E119 Type 2 diabetes mellitus without complications: Secondary | ICD-10-CM

## 2023-06-17 ENCOUNTER — Telehealth: Payer: Self-pay | Admitting: Internal Medicine

## 2023-06-17 MED ORDER — RIFAXIMIN 550 MG PO TABS: 550.0000 mg | ORAL_TABLET | Freq: Two times a day (BID) | ORAL | 11 refills | Status: DC

## 2023-06-17 NOTE — Telephone Encounter (Signed)
 Patients wife reached the front desk to say she had left a message for nurse re: patient's Xifaxan .  She said he didn't qualify for assistance and the medication would need to be called in to [facility] today or tomorrow.  She did say she left a message for nurse.

## 2023-06-18 ENCOUNTER — Other Ambulatory Visit: Payer: Self-pay

## 2023-06-26 ENCOUNTER — Other Ambulatory Visit: Payer: Self-pay | Admitting: Family Medicine

## 2023-06-26 DIAGNOSIS — E119 Type 2 diabetes mellitus without complications: Secondary | ICD-10-CM

## 2023-07-01 ENCOUNTER — Encounter: Payer: Self-pay | Admitting: Internal Medicine

## 2023-07-03 ENCOUNTER — Other Ambulatory Visit: Payer: Self-pay | Admitting: Family Medicine

## 2023-07-03 DIAGNOSIS — N529 Male erectile dysfunction, unspecified: Secondary | ICD-10-CM

## 2023-07-10 ENCOUNTER — Telehealth: Payer: Self-pay

## 2023-07-10 ENCOUNTER — Other Ambulatory Visit: Payer: Self-pay | Admitting: Family Medicine

## 2023-07-10 NOTE — Telephone Encounter (Signed)
Contacted patient due to CRM note of returning call. Nobody at clinic had called patient. Patient told that if that we would reach out if we needed to contact. Patient understood.

## 2023-07-23 ENCOUNTER — Other Ambulatory Visit: Payer: Self-pay | Admitting: Family Medicine

## 2023-07-23 DIAGNOSIS — E119 Type 2 diabetes mellitus without complications: Secondary | ICD-10-CM

## 2023-07-31 ENCOUNTER — Other Ambulatory Visit: Payer: Self-pay

## 2023-07-31 DIAGNOSIS — D500 Iron deficiency anemia secondary to blood loss (chronic): Secondary | ICD-10-CM

## 2023-07-31 DIAGNOSIS — D696 Thrombocytopenia, unspecified: Secondary | ICD-10-CM

## 2023-07-31 DIAGNOSIS — R768 Other specified abnormal immunological findings in serum: Secondary | ICD-10-CM

## 2023-08-01 ENCOUNTER — Inpatient Hospital Stay: Payer: Medicare HMO | Attending: Hematology

## 2023-08-01 DIAGNOSIS — I5032 Chronic diastolic (congestive) heart failure: Secondary | ICD-10-CM | POA: Diagnosis not present

## 2023-08-01 DIAGNOSIS — D631 Anemia in chronic kidney disease: Secondary | ICD-10-CM | POA: Insufficient documentation

## 2023-08-01 DIAGNOSIS — Z87891 Personal history of nicotine dependence: Secondary | ICD-10-CM | POA: Diagnosis not present

## 2023-08-01 DIAGNOSIS — N184 Chronic kidney disease, stage 4 (severe): Secondary | ICD-10-CM | POA: Insufficient documentation

## 2023-08-01 DIAGNOSIS — D696 Thrombocytopenia, unspecified: Secondary | ICD-10-CM | POA: Insufficient documentation

## 2023-08-01 DIAGNOSIS — D500 Iron deficiency anemia secondary to blood loss (chronic): Secondary | ICD-10-CM | POA: Diagnosis not present

## 2023-08-01 DIAGNOSIS — R778 Other specified abnormalities of plasma proteins: Secondary | ICD-10-CM | POA: Insufficient documentation

## 2023-08-01 DIAGNOSIS — I130 Hypertensive heart and chronic kidney disease with heart failure and stage 1 through stage 4 chronic kidney disease, or unspecified chronic kidney disease: Secondary | ICD-10-CM | POA: Insufficient documentation

## 2023-08-01 DIAGNOSIS — E1122 Type 2 diabetes mellitus with diabetic chronic kidney disease: Secondary | ICD-10-CM | POA: Insufficient documentation

## 2023-08-01 DIAGNOSIS — K7030 Alcoholic cirrhosis of liver without ascites: Secondary | ICD-10-CM | POA: Insufficient documentation

## 2023-08-01 DIAGNOSIS — D72819 Decreased white blood cell count, unspecified: Secondary | ICD-10-CM | POA: Insufficient documentation

## 2023-08-01 DIAGNOSIS — R040 Epistaxis: Secondary | ICD-10-CM | POA: Diagnosis not present

## 2023-08-01 DIAGNOSIS — R161 Splenomegaly, not elsewhere classified: Secondary | ICD-10-CM | POA: Diagnosis not present

## 2023-08-01 DIAGNOSIS — R768 Other specified abnormal immunological findings in serum: Secondary | ICD-10-CM

## 2023-08-01 LAB — CBC WITH DIFFERENTIAL/PLATELET
Abs Immature Granulocytes: 0.02 10*3/uL (ref 0.00–0.07)
Basophils Absolute: 0 10*3/uL (ref 0.0–0.1)
Basophils Relative: 0 %
Eosinophils Absolute: 0.3 10*3/uL (ref 0.0–0.5)
Eosinophils Relative: 5 %
HCT: 35.5 % — ABNORMAL LOW (ref 39.0–52.0)
Hemoglobin: 12.7 g/dL — ABNORMAL LOW (ref 13.0–17.0)
Immature Granulocytes: 0 %
Lymphocytes Relative: 20 %
Lymphs Abs: 1 10*3/uL (ref 0.7–4.0)
MCH: 33.7 pg (ref 26.0–34.0)
MCHC: 35.8 g/dL (ref 30.0–36.0)
MCV: 94.2 fL (ref 80.0–100.0)
Monocytes Absolute: 0.4 10*3/uL (ref 0.1–1.0)
Monocytes Relative: 8 %
Neutro Abs: 3.3 10*3/uL (ref 1.7–7.7)
Neutrophils Relative %: 67 %
Platelets: 96 10*3/uL — ABNORMAL LOW (ref 150–400)
RBC: 3.77 MIL/uL — ABNORMAL LOW (ref 4.22–5.81)
RDW: 12.4 % (ref 11.5–15.5)
Smear Review: DECREASED
WBC: 5 10*3/uL (ref 4.0–10.5)
nRBC: 0 % (ref 0.0–0.2)

## 2023-08-01 LAB — COMPREHENSIVE METABOLIC PANEL
ALT: 21 U/L (ref 0–44)
AST: 24 U/L (ref 15–41)
Albumin: 4.2 g/dL (ref 3.5–5.0)
Alkaline Phosphatase: 91 U/L (ref 38–126)
Anion gap: 8 (ref 5–15)
BUN: 34 mg/dL — ABNORMAL HIGH (ref 8–23)
CO2: 25 mmol/L (ref 22–32)
Calcium: 8.9 mg/dL (ref 8.9–10.3)
Chloride: 102 mmol/L (ref 98–111)
Creatinine, Ser: 2.12 mg/dL — ABNORMAL HIGH (ref 0.61–1.24)
GFR, Estimated: 33 mL/min — ABNORMAL LOW (ref >60)
Glucose, Bld: 214 mg/dL — ABNORMAL HIGH (ref 70–99)
Potassium: 4.3 mmol/L (ref 3.5–5.1)
Sodium: 135 mmol/L (ref 135–145)
Total Bilirubin: 1 mg/dL (ref 0.0–1.2)
Total Protein: 7.6 g/dL (ref 6.5–8.1)

## 2023-08-01 LAB — VITAMIN B12: Vitamin B-12: 1204 pg/mL — ABNORMAL HIGH (ref 180–914)

## 2023-08-01 LAB — IRON AND TIBC
Iron: 73 ug/dL (ref 45–182)
Saturation Ratios: 24 % (ref 17.9–39.5)
TIBC: 302 ug/dL (ref 250–450)
UIBC: 229 ug/dL

## 2023-08-01 LAB — FERRITIN: Ferritin: 110 ng/mL (ref 24–336)

## 2023-08-04 LAB — METHYLMALONIC ACID, SERUM: Methylmalonic Acid, Quantitative: 271 nmol/L (ref 0–378)

## 2023-08-08 ENCOUNTER — Inpatient Hospital Stay (HOSPITAL_BASED_OUTPATIENT_CLINIC_OR_DEPARTMENT_OTHER): Payer: Medicare HMO | Admitting: Oncology

## 2023-08-08 VITALS — BP 118/77 | HR 57 | Temp 97.7°F | Resp 18 | Wt 199.7 lb

## 2023-08-08 DIAGNOSIS — K7030 Alcoholic cirrhosis of liver without ascites: Secondary | ICD-10-CM | POA: Diagnosis not present

## 2023-08-08 DIAGNOSIS — E1122 Type 2 diabetes mellitus with diabetic chronic kidney disease: Secondary | ICD-10-CM | POA: Diagnosis not present

## 2023-08-08 DIAGNOSIS — I5032 Chronic diastolic (congestive) heart failure: Secondary | ICD-10-CM | POA: Diagnosis not present

## 2023-08-08 DIAGNOSIS — R768 Other specified abnormal immunological findings in serum: Secondary | ICD-10-CM

## 2023-08-08 DIAGNOSIS — N184 Chronic kidney disease, stage 4 (severe): Secondary | ICD-10-CM | POA: Diagnosis not present

## 2023-08-08 DIAGNOSIS — R778 Other specified abnormalities of plasma proteins: Secondary | ICD-10-CM | POA: Diagnosis not present

## 2023-08-08 DIAGNOSIS — D696 Thrombocytopenia, unspecified: Secondary | ICD-10-CM

## 2023-08-08 DIAGNOSIS — Z87891 Personal history of nicotine dependence: Secondary | ICD-10-CM | POA: Diagnosis not present

## 2023-08-08 DIAGNOSIS — I130 Hypertensive heart and chronic kidney disease with heart failure and stage 1 through stage 4 chronic kidney disease, or unspecified chronic kidney disease: Secondary | ICD-10-CM | POA: Diagnosis not present

## 2023-08-08 DIAGNOSIS — R040 Epistaxis: Secondary | ICD-10-CM | POA: Diagnosis not present

## 2023-08-08 DIAGNOSIS — R161 Splenomegaly, not elsewhere classified: Secondary | ICD-10-CM | POA: Diagnosis not present

## 2023-08-08 DIAGNOSIS — D500 Iron deficiency anemia secondary to blood loss (chronic): Secondary | ICD-10-CM | POA: Diagnosis not present

## 2023-08-08 DIAGNOSIS — D72819 Decreased white blood cell count, unspecified: Secondary | ICD-10-CM | POA: Diagnosis not present

## 2023-08-08 DIAGNOSIS — D631 Anemia in chronic kidney disease: Secondary | ICD-10-CM | POA: Diagnosis not present

## 2023-08-08 NOTE — Progress Notes (Signed)
 [facility] [address], [place] [address]   CLINIC:  Medical Oncology/Hematology  PCP:  Babs Sciara, MD [address] [place] [address] [phone]   REASON FOR VISIT:  Follow-up for normocytic anemia (iron deficiency + CKD), thrombocytopenia, and leukopenia   CURRENT THERAPY: Intermittent iron infusions  INTERVAL HISTORY:   Austin Morgan 68 y.o. male returns for routine follow-up of his iron deficiency anemia, anemia of CKD, thrombocytopenia, and leukopenia.  He was last seen by me on 04/04/2023.   At today's visit, he reports feeling well.   He has not had any hospitalizations, surgeries, or changes in his baseline health status.  Patient had a colonoscopy on 04/03/2023 by Dr. Jena Gauss.  Pathology revealed several tubular adenoma without high-grade dysplasia or malignancy.   He reports that his energy is stable.  He denies any recurrent ice pica but does continue to have restless leg.  Has occasional numbness and tingling at times in his fingers and toes.  Has some underlying anxiety and depression.  He received 1 dose of IV Venofer 300 mg on 12/06/2022 with good tolerance.  Reports he and his wife are going on a cruise in November.  He has been exercising 7 days/week at home using weights.  He is down approximately 6 pounds.  He continues to be primary caretaker of his mother-in-law.  Has been taken vitamins from [employer] and he thinks this has improved his lab work tremendously.  He reports he also is no longer on a statin due to these vitamins.   Patient denies any hematemesis, melena, or hematochezia since his last visit.   He reports occasional easy bruising or rather "thin skin". No frequent infections or B symptoms such as fever, chills, night sweats, unintentional weight loss.  He denies any new bone pain or neurologic changes.  He is taking vitamin B12 three times weekly.  He has 75% energy and 100% appetite.  He denies any  pain.  ASSESSMENT & PLAN:  1.  Normocytic anemia secondary to iron deficiency and CKD stage IIIb/IV -  Normocytic anemia since 2021, with significant worsening of his anemia in April 2023 (Hgb 5.6) during admission to hospital for GI bleeding.   - Hematology work-up (12/20/2021): Reticulocytes 1.5% (hypoproliferative in the setting of moderate anemia) LDH normal. Ferritin 31, iron saturation 21%. Normal folate, copper. Normal B12 781 with mildly elevated methylmalonic acid 411. SPEP negative.  Immunofixation shows polyclonal gammopathy. LDH normal.   - Hospitalized in April 2023 for GI bleeding secondary to varices, requiring esophageal variceal banding and PRBC transfusion x 3.  He was hospitalized again in May 2023 with volume overload and symptomatic anemia requiring repeat EGD (10/31/2021) which showed portal gastropathy and grade 1 varices - he received Venofer 300 mg during hospitalization in May. - Follows with [facility] Tana Coast, PA-C and Dr. Jena Gauss) for his alcohol-related liver cirrhosis and GI bleeding. - He has been taking oral iron supplementation since June 2023 - He is taking vitamin B12 500 mcg 3 times weekly - Most recent IV Venofer 300 mg x  1 on 12/06/22. - Receiving Procrit via Dr. Wolfgang Phoenix, but has not required injection since 02/22/2022 due to Hgb >10 - Denies any recent hematemesis, melena, or hematochezia.  2.  Thrombocytopenia and leukopenia (cirrhosis and splenomegaly) - Intermittent mild leukopenia since April 2023.  He has had mild thrombocytopenia since June 2022, with most recent platelets (12/13/2021) at 21.   - Nephrology testing for  hepatitis B, hepatitis C, and HIV in June 2023 was negative - Hematology work-up (12/20/2021): Reticulocytes 1.5% (hypoproliferative in the setting of moderate anemia) Normal folate, copper. Normal B12 781 with mildly elevated methylmalonic acid 411. SPEP negative for M spike.  Immunofixation shows  polyclonal gammopathy.  LDH normal. - No history of autoimmune or connective tissue disorder. - Patient has liver cirrhosis and splenomegaly - Follows with [facility] Tana Coast, PA-C and Dr. Jena Gauss) for his alcohol-related liver cirrhosis and GI bleeding. - Most recent abdominal ultrasound (12/07/2020) showed splenomegaly measuring 14 cm with volume 678 cc. - Admits to easy bruising, no petechial rash.  Occasional mild epistaxis.  3.  Elevated free light chains - Labs obtained by nephrologist (11/15/2021) show elevated free serum light chains with elevated kappa 168.3, elevated lambda 92.6, and elevated ratio 1.82. - 24-hour urine (11/15/2021) showed elevated urine light chains with kappa 96.75 and lambda 9.02 with ratio 10.73.  Urine immunofixation showed polyclonal light chains but was negative for monoclonal immunoglobulin or Bence-Jones protein. - Hematology work-up (12/20/2021):  SPEP negative for M spike.  Immunofixation shows polyclonal gammopathy.  Elevated kappa light chains 138.0, elevated lambda 78.5, with elevated ratio 1.76 (in keeping with CKD) - He denies any new bone pain or B symptoms.     4.  Other history - PMH: CKD stage 3b, type 2 diabetes mellitus, anemia of chronic disease, hypertension, alcoholic liver cirrhosis, history of GI bleed from esophageal varices, restless legs, sleep apnea, and chronic diastolic CHF. 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 - SUBSTANCE: History of heavy alcohol use for 20 years, but has been sober since October 2022.  He smoked 0.5 PPD cigarettes for 30 years, but quit in 2017.  He has history of drug use many years ago, but denies any IV drug use. - FAMILY: No family history of blood abnormalities or cancer.   PLAN: 1. Iron deficiency anemia due to chronic blood loss - Differential diagnosis favors iron deficiency anemia  secondary to GI blood loss as well as anemia secondary to his CKD stage IIIb. -Most recent labs 08/01/2023 show hemoglobin 12.7/MCV 94.2, ferritin 110, iron sat 24%.  Baseline CKD stage IIIb/IV with creatinine 2.12/GFR 33 vitamin B12 1204 with normal MMA. -Recommend he continue oral iron supplements at this time 4 hours apart from his Protonix. -Recommend further decreasing for stopping B12 altogether given B12 continues to be elevated despite decreasing to 2 tablets weekly. -Recommend labs in 4 months with follow-up a week later.  2. Thrombocytopenia (HCC) - Most recent labs (08/01/2023): Platelets 96.  Normal WBC and differential.  Vitamin B12 1204, MMA is normal 340. -Will continue to monitor platelet counts every 4 months.  3. Elevated serum immunoglobulin free light chains -Elevated light chains secondary to CKD. -No further workup needed at this time.   PLAN SUMMARY: ** Needs THURSDAY MORNING APPOINTMENTS only ** >> No additional IV iron needed at this time. >> Discontinue or further decrease B12 supplements to once per week.  >> Labs in 4 months = CMC/D, CMP, ferritin, iron/TIBC, B12, MMA >> OFFICE visit in 4 months (1 week after labs)     REVIEW OF SYSTEMS:  Review of Systems  Constitutional:  Negative for appetite change and fatigue.  Cardiovascular:  Negative for chest pain.  Gastrointestinal:  Negative for blood in stool.  Genitourinary:  Negative for hematuria.   Neurological:  Positive for numbness. Negative for  headaches.  Psychiatric/Behavioral:  Positive for depression. The patient is nervous/anxious.      PHYSICAL EXAM:  ECOG PERFORMANCE STATUS: 1 - Symptomatic but completely ambulatory  There were no vitals filed for this visit.  There were no vitals filed for this visit.  Physical Exam Constitutional:      Appearance: Normal appearance.  Cardiovascular:     Rate and Rhythm: Normal rate and regular rhythm.  Pulmonary:     Effort: Pulmonary effort is  normal.     Breath sounds: Normal breath sounds.  Abdominal:     General: Bowel sounds are normal.     Palpations: Abdomen is soft.  Musculoskeletal:        General: No swelling. Normal range of motion.  Neurological:     Mental Status: He is alert and oriented to person, place, and time. Mental status is at baseline.     PAST MEDICAL/SURGICAL HISTORY:  Past Medical History:  Diagnosis Date   Anxiety    Arthritis    CHF (congestive heart failure) (HCC)    Cirrhosis (HCC)    Depression    Diabetes mellitus without complication (HCC)    ED (erectile dysfunction)    Gout    H/O ETOH abuse    in remission   Hyperlipidemia    Hypertension    Hypertriglyceridemia    Iron deficiency anemia due to chronic blood loss 12/20/2021   PONV (postoperative nausea and vomiting)    Sleep apnea    Past Surgical History:  Procedure Laterality Date   CARPAL TUNNEL RELEASE Bilateral    COLONOSCOPY  2010   Dr. Lina Sar: mild diverticulosis, next colonoscopy 2020   COLONOSCOPY WITH PROPOFOL N/A 01/21/2020   Rourk: 9 polyps removed, multiple tubular adenomas.  Next colonoscopy in 3 years.   COLONOSCOPY WITH PROPOFOL N/A 04/03/2023   Procedure: COLONOSCOPY WITH PROPOFOL;  Surgeon: Corbin Ade, MD;  [facility];  Service: Endoscopy;  Laterality: N/A;  945AM, ASA 3   ELBOW SURGERY     ESOPHAGEAL BANDING  09/28/2021   Procedure: ESOPHAGEAL BANDING;  Surgeon: Corbin Ade, MD;  [facility]: [facility];  Service: Endoscopy;;   ESOPHAGOGASTRODUODENOSCOPY (EGD) WITH PROPOFOL N/A 01/21/2020   Rourk: normal   ESOPHAGOGASTRODUODENOSCOPY (EGD) WITH PROPOFOL N/A 09/28/2021   Procedure: ESOPHAGOGASTRODUODENOSCOPY (EGD) WITH PROPOFOL;  Surgeon: Corbin Ade, MD;  [facility];  Service: Endoscopy;  Laterality: N/A;  3:00pm   ESOPHAGOGASTRODUODENOSCOPY (EGD) WITH PROPOFOL N/A 10/31/2021   Procedure: ESOPHAGOGASTRODUODENOSCOPY (EGD) WITH PROPOFOL;  Surgeon: Dolores Frame, MD;  [facility];  Service: Gastroenterology;  Laterality: N/A;   ESOPHAGOGASTRODUODENOSCOPY (EGD) WITH PROPOFOL N/A 05/23/2022   Procedure: ESOPHAGOGASTRODUODENOSCOPY (EGD) WITH PROPOFOL;  Surgeon: Corbin Ade, MD;  [facility];  Service: Endoscopy;  Laterality: N/A;  8:30am, asa 3   NECK SURGERY     c4   POLYPECTOMY  01/21/2020   Procedure: POLYPECTOMY;  Surgeon: Corbin Ade, MD;  [facility];  Service: Endoscopy;;  colon   POLYPECTOMY  04/03/2023   Procedure: POLYPECTOMY;  Surgeon: Corbin Ade, MD;  [facility];  Service: Endoscopy;;    SOCIAL HISTORY:  Social History   Socioeconomic History   Marital status: Married    Spouse name: Not on file   Number of children: Not on file   Years of education: Not on file   Highest education level: Not on file  Occupational History   Not on  file  Tobacco Use   Smoking status: Former    Current packs/day: 0.00    Average packs/day: 1 pack/day for 36.0 years (36.0 ttl pk-yrs)    Types: Cigarettes    Start date: 03/11/1980    Quit date: 03/11/2016    Years since quitting: 7.4    Passive exposure: Past   Smokeless tobacco: Never  Vaping Use   Vaping status: Never Used  Substance and Sexual Activity   Alcohol use: Not Currently    Comment: drinks a fifth of liquor per week; inpatient rehab 08/2019. denied 10/14/19. No etoh since 03/2021 (confirmed 02/12/23)   Drug use: No   Sexual activity: Yes  Other Topics Concern   Not on file  Social History Narrative   Wife, Braxson Hollingsworth NP [facility]   Social Drivers of Health   Financial Resource Strain: Low Risk  (08/24/2022)   Overall Financial Resource Strain (CARDIA)    Difficulty of Paying Living Expenses: Not hard at all  Food Insecurity: No Food Insecurity (08/24/2022)   Hunger Vital Sign    Worried About Running Out of Food in the Last Year: Never true    Ran Out of Food in the Last Year: Never  true  Transportation Needs: No Transportation Needs (08/24/2022)   PRAPARE - Administrator, Civil Service (Medical): No    Lack of Transportation (Non-Medical): No  Physical Activity: Inactive (08/24/2022)   Exercise Vital Sign    Days of Exercise per Week: 0 days    Minutes of Exercise per Session: 0 min  Stress: No Stress Concern Present (08/24/2022)   [employer] of Occupational Health - Occupational Stress Questionnaire    Feeling of Stress : Not at all  Social Connections: Socially Integrated (08/24/2022)   Social Connection and Isolation Panel [NHANES]    Frequency of Communication with Friends and Family: More than three times a week    Frequency of Social Gatherings with Friends and Family: More than three times a week    Attends Religious Services: More than 4 times per year    Active Member of [employer] or Organizations: Yes    Attends Engineer, structural: More than 4 times per year    Marital Status: Married  Catering manager Violence: Not At Risk (08/24/2022)   Humiliation, Afraid, Rape, and Kick questionnaire    Fear of Current or Ex-Partner: No    Emotionally Abused: No    Physically Abused: No    Sexually Abused: No    FAMILY HISTORY:  Family History  Problem Relation Age of Onset   Heart attack Father    Heart Problems Brother        Had open heart surgery   Colon cancer Neg Hx     CURRENT MEDICATIONS:  Outpatient Encounter Medications as of 08/08/2023  Medication Sig   albuterol (VENTOLIN HFA) 108 (90 Base) MCG/ACT inhaler INHALE 2 PUFFS BY MOUTH EVERY 6 HOURS AS NEEDED FOR WHEEZING   augmented betamethasone dipropionate (DIPROLENE-AF) 0.05 % cream Apply topically 2 (two) times daily as needed.   BD PEN NEEDLE NANO 2ND GEN 32G X 4 MM MISC USE TO ADMINISTER INSULIN   Continuous Blood Gluc Receiver (DEXCOM G6 RECEIVER) DEVI 1 Device by Does not apply route continuous.   Continuous Blood Gluc Sensor (DEXCOM G6 SENSOR) MISC 1 Units by Does  not apply route continuous. (Patient taking differently: 1 Units by Does not apply route continuous. G7)   cyanocobalamin (VITAMIN B12)  500 MCG tablet Take 500 mcg by mouth every Monday, Wednesday, and Friday.   diclofenac Sodium (VOLTAREN) 1 % GEL Apply 2 g topically in the morning, at noon, and at bedtime.   ferrous sulfate 325 (65 FE) MG EC tablet Take 325 mg by mouth every morning.   furosemide (LASIX) 40 MG tablet Take 1 tablet (40 mg total) by mouth 2 (two) times daily.   insulin glargine (LANTUS SOLOSTAR) 100 UNIT/ML Solostar Pen Inject 66 units subcutaneously every evening may titrate up to 80 units   magnesium oxide (MAG-OX) 400 (240 Mg) MG tablet Take 400 mg by mouth daily.   metolazone (ZAROXOLYN) 5 MG tablet Take 5 mg by mouth every Monday, Wednesday, and Friday.   Multiple Vitamins-Minerals (MULTIVITAMIN WITH MINERALS) tablet Take 1 tablet by mouth daily.   nadolol (CORGARD) 40 MG tablet Take 1/2 (one-half) tablet by mouth once daily   NOVOLOG FLEXPEN 100 UNIT/ML FlexPen INJECT 20 UNITS SUBCUTANEOUSLY IN THE MORNING, 10 UNITS SUBCUTANEOUSLY WITH LUNCH AND 20 UNITS SUBCUTANEOUSLY WITH SUPPER   ondansetron (ZOFRAN) 8 MG tablet TAKE 1 TABLET BY MOUTH THREE TIMES DAILY AS NEEDED FOR NAUSEA   pantoprazole (PROTONIX) 40 MG tablet Take 1 tablet (40 mg total) by mouth 2 (two) times daily. (Patient taking differently: Take 40 mg by mouth daily as needed (acid reflux).)   pramipexole (MIRAPEX) 0.25 MG tablet Take 1 tablet (0.25 mg total) by mouth at bedtime.   rifaximin (XIFAXAN) 550 MG TABS tablet Take 1 tablet (550 mg total) by mouth 2 (two) times daily.   spironolactone (ALDACTONE) 25 MG tablet Take 25 mg by mouth every Monday, Wednesday, and Friday.   tadalafil (CIALIS) 20 MG tablet TAKE 1/2 TO 1 (ONE-HALF TO ONE) TABLET BY MOUTH EVERY OTHER DAY AS NEEDED FOR  ERECTILE  DYSFUNCTION   traZODone (DESYREL) 100 MG tablet One and one half qhs   triamcinolone cream (KENALOG) 0.1 % Apply thin  amount bid prn   No facility-administered encounter medications on file as of 08/08/2023.    ALLERGIES:  Allergies  Allergen Reactions   Codeine Nausea And Vomiting    LABORATORY DATA:  I have reviewed the labs as listed.  CBC    Component Value Date/Time   WBC 5.0 08/01/2023 0857   RBC 3.77 (L) 08/01/2023 0857   HGB 12.7 (L) 08/01/2023 0857   HGB 9.1 (L) 03/02/2022 1158   HCT 35.5 (L) 08/01/2023 0857   HCT 26.8 (L) 03/02/2022 1158   PLT 96 (L) 08/01/2023 0857   PLT 128 (L) 03/02/2022 1158   MCV 94.2 08/01/2023 0857   MCV 91 03/02/2022 1158   MCH 33.7 08/01/2023 0857   MCHC 35.8 08/01/2023 0857   RDW 12.4 08/01/2023 0857   RDW 12.2 03/02/2022 1158   LYMPHSABS 1.0 08/01/2023 0857   LYMPHSABS 1.1 03/02/2022 1158   MONOABS 0.4 08/01/2023 0857   EOSABS 0.3 08/01/2023 0857   EOSABS 0.4 03/02/2022 1158   BASOSABS 0.0 08/01/2023 0857   BASOSABS 0.0 03/02/2022 1158      Latest Ref Rng & Units 08/01/2023    8:57 AM 03/19/2023   12:53 PM 11/23/2022   12:23 PM  CMP  Glucose 70 - 99 mg/dL 161  096  045   BUN 8 - 23 mg/dL 34  37  48   Creatinine 0.61 - 1.24 mg/dL 4.09  8.11  9.14   Sodium 135 - 145 mmol/L 135  135  137   Potassium 3.5 - 5.1 mmol/L 4.3  4.2  3.6   Chloride 98 - 111 mmol/L 102  100  100   CO2 22 - 32 mmol/L 25  27  26    Calcium 8.9 - 10.3 mg/dL 8.9  9.3  9.2   Total Protein 6.5 - 8.1 g/dL 7.6  7.9  7.4   Total Bilirubin 0.0 - 1.2 mg/dL 1.0  1.5  1.4   Alkaline Phos 38 - 126 U/L 91  93  107   AST 15 - 41 U/L 24  27  26    ALT 0 - 44 U/L 21  27  20      DIAGNOSTIC IMAGING:  I have independently reviewed the relevant imaging and discussed with the patient.   WRAP UP:  All questions were answered. The patient knows to call the clinic with any problems, questions or concerns.  Medical decision making: Moderate  Time spent on visit: I spent 25 minutes counseling the patient face to face. The total time spent in the appointment was 30 minutes and more than 50%  was on counseling.  Austin Kaufmann, NP  08/08/23 9:26 AM

## 2023-08-26 ENCOUNTER — Ambulatory Visit: Payer: Medicare HMO | Admitting: Family Medicine

## 2023-08-30 ENCOUNTER — Ambulatory Visit: Payer: Medicare HMO

## 2023-08-30 ENCOUNTER — Other Ambulatory Visit: Payer: Self-pay | Admitting: Family Medicine

## 2023-08-30 ENCOUNTER — Telehealth: Payer: Self-pay | Admitting: Family Medicine

## 2023-08-30 VITALS — BP 114/66 | Ht 66.0 in | Wt 198.0 lb

## 2023-08-30 DIAGNOSIS — Z2821 Immunization not carried out because of patient refusal: Secondary | ICD-10-CM | POA: Diagnosis not present

## 2023-08-30 DIAGNOSIS — Z0000 Encounter for general adult medical examination without abnormal findings: Secondary | ICD-10-CM | POA: Diagnosis not present

## 2023-08-30 MED ORDER — PANTOPRAZOLE SODIUM 40 MG PO TBEC: 40.0000 mg | DELAYED_RELEASE_TABLET | Freq: Two times a day (BID) | ORAL | 3 refills | Status: DC

## 2023-08-30 NOTE — Telephone Encounter (Signed)
 Nurses He just recently did a annual wellness visit But he is due for a diabetic checkup Needs to be scheduled within the next 3 months I do recommend A1c and lipid His other doctors are checking lab work but they do not check A1c and lipid Diagnosis diabetes and hyperlipidemia

## 2023-08-30 NOTE — Patient Instructions (Signed)
 Mr. Oberman , Thank you for taking time to come for your Medicare Wellness Visit. I appreciate your ongoing commitment to your health goals. Please review the following plan we discussed and let me know if I can assist you in the future.   Referrals/Orders/Follow-Ups/Clinician Recommendations: Follow up on Vaccinations, Exams and Blood work at next appointment  This is a list of the screening recommended for you and due dates:  Health Maintenance  Topic Date Due   Zoster (Shingles) Vaccine (1 of 2) Never done   Screening for Lung Cancer  Never done   COVID-19 Vaccine (3 - Pfizer risk series) 10/30/2019   Complete foot exam   03/03/2023   Yearly kidney health urinalysis for diabetes  05/23/2023   Hemoglobin A1C  08/25/2023   Eye exam for diabetics  12/19/2023   Yearly kidney function blood test for diabetes  07/31/2024   Medicare Annual Wellness Visit  08/29/2024   Colon Cancer Screening  04/02/2026   DTaP/Tdap/Td vaccine (2 - Tdap) 03/02/2032   Pneumonia Vaccine  Completed   Flu Shot  Completed   Hepatitis C Screening  Completed   HPV Vaccine  Aged Out    Advanced directives: (Declined) Advance directive discussed with you today. Even though you declined this today, please call our office should you change your mind, and we can give you the proper paperwork for you to fill out.  Next Medicare Annual Wellness Visit scheduled for next year: Yes

## 2023-08-30 NOTE — Progress Notes (Signed)
 Because this visit was a virtual/telehealth visit,  certain criteria was not obtained, such a blood pressure, CBG if applicable, and timed get up and go. Any medications not marked as "taking" were not mentioned during the medication reconciliation part of the visit. Any vitals not documented were not able to be obtained due to this being a telehealth visit or patient was unable to self-report a recent blood pressure reading due to a lack of equipment at home via telehealth. Vitals that have been documented are verbally provided by the patient.   Subjective:   Austin Morgan is a 68 y.o. who presents for a Medicare Wellness preventive visit.  Visit Complete: Virtual I connected with  Austin Morgan on 08/30/23 by a audio enabled telemedicine application and verified that I am speaking with the correct person using two identifiers.  Patient Location: Home  Provider Location: Home Office  I discussed the limitations of evaluation and management by telemedicine. The patient expressed understanding and agreed to proceed.  Vital Signs: Because this visit was a virtual/telehealth visit, some criteria may be missing or patient reported. Any vitals not documented were not able to be obtained and vitals that have been documented are patient reported.  VideoDeclined- This patient declined Librarian, academic. Therefore the visit was completed with audio only.  Persons Participating in Visit: Patient.  AWV Questionnaire: No: Patient Medicare AWV questionnaire was not completed prior to this visit.  Cardiac Risk Factors include: advanced age (>83men, >48 women);diabetes mellitus;dyslipidemia;sedentary lifestyle;hypertension;male gender     Objective:    Today's Vitals   08/30/23 1127  BP: 114/66  Weight: 198 lb (89.8 kg)  Height: 5\' 6"  (1.676 m)   Body mass index is 31.96 kg/m.     08/30/2023   11:33 AM 08/08/2023    9:42 AM 04/04/2023    9:38 AM 04/03/2023    8:13  AM 04/01/2023    1:32 PM 02/27/2023    2:34 PM 12/06/2022    9:46 AM  Advanced Directives  Does Patient Have a Medical Advance Directive? No Yes Yes Yes No Yes No  Type of Special educational needs teacher of [place];Living will Healthcare Power of [place];Living will Healthcare Power of [employer] of [place];Living will   Does patient want to make changes to medical advance directive?  No - Patient declined    No - Patient declined   Copy of Healthcare Power of Attorney in Chart?  No - copy requested No - copy requested No - copy requested     Would patient like information on creating a medical advance directive? No - Patient declined No - Patient declined No - Patient declined No - Patient declined No - Patient declined  No - Patient declined    Current Medications (verified) Outpatient Encounter Medications as of 08/30/2023  Medication Sig   albuterol (VENTOLIN HFA) 108 (90 Base) MCG/ACT inhaler INHALE 2 PUFFS BY MOUTH EVERY 6 HOURS AS NEEDED FOR WHEEZING   augmented betamethasone dipropionate (DIPROLENE-AF) 0.05 % cream Apply topically 2 (two) times daily as needed.   BD PEN NEEDLE NANO 2ND GEN 32G X 4 MM MISC USE TO ADMINISTER INSULIN   Continuous Blood Gluc Receiver (DEXCOM G6 RECEIVER) DEVI 1 Device by Does not apply route continuous.   Continuous Blood Gluc Sensor (DEXCOM G6 SENSOR) MISC 1 Units by Does not apply route continuous. (Patient taking differently: 1 Units by Does not apply route continuous. G7)   cyanocobalamin (VITAMIN B12) 500 MCG tablet Take  500 mcg by mouth every Monday, Wednesday, and Friday.   diclofenac Sodium (VOLTAREN) 1 % GEL Apply 2 g topically in the morning, at noon, and at bedtime.   ferrous sulfate 325 (65 FE) MG EC tablet Take 325 mg by mouth every morning.   insulin glargine (LANTUS SOLOSTAR) 100 UNIT/ML Solostar Pen Inject 66 units subcutaneously every evening may titrate up to 80 units   magnesium oxide (MAG-OX) 400 (240 Mg) MG tablet  Take 400 mg by mouth daily.   Multiple Vitamins-Minerals (MULTIVITAMIN WITH MINERALS) tablet Take 1 tablet by mouth daily.   nadolol (CORGARD) 40 MG tablet Take 1/2 (one-half) tablet by mouth once daily   NOVOLOG FLEXPEN 100 UNIT/ML FlexPen INJECT 20 UNITS SUBCUTANEOUSLY IN THE MORNING, 10 UNITS SUBCUTANEOUSLY WITH LUNCH AND 20 UNITS SUBCUTANEOUSLY WITH SUPPER   ondansetron (ZOFRAN) 8 MG tablet TAKE 1 TABLET BY MOUTH THREE TIMES DAILY AS NEEDED FOR NAUSEA   pantoprazole (PROTONIX) 40 MG tablet Take 1 tablet (40 mg total) by mouth 2 (two) times daily. (Patient taking differently: Take 40 mg by mouth daily as needed (acid reflux).)   pramipexole (MIRAPEX) 0.25 MG tablet Take 1 tablet (0.25 mg total) by mouth at bedtime.   rifaximin (XIFAXAN) 550 MG TABS tablet Take 1 tablet (550 mg total) by mouth 2 (two) times daily.   spironolactone (ALDACTONE) 25 MG tablet Take 25 mg by mouth every Monday, Wednesday, and Friday.   tadalafil (CIALIS) 20 MG tablet TAKE 1/2 TO 1 (ONE-HALF TO ONE) TABLET BY MOUTH EVERY OTHER DAY AS NEEDED FOR  ERECTILE  DYSFUNCTION   traZODone (DESYREL) 100 MG tablet One and one half qhs   triamcinolone cream (KENALOG) 0.1 % Apply thin amount bid prn   furosemide (LASIX) 40 MG tablet Take 1 tablet (40 mg total) by mouth 2 (two) times daily.   metolazone (ZAROXOLYN) 5 MG tablet Take 5 mg by mouth every Monday, Wednesday, and Friday.   No facility-administered encounter medications on file as of 08/30/2023.    Allergies (verified) Codeine   History: Past Medical History:  Diagnosis Date   Anxiety    Arthritis    CHF (congestive heart failure) (HCC)    Cirrhosis (HCC)    Depression    Diabetes mellitus without complication (HCC)    ED (erectile dysfunction)    Gout    H/O ETOH abuse    in remission   Hyperlipidemia    Hypertension    Hypertriglyceridemia    Iron deficiency anemia due to chronic blood loss 12/20/2021   PONV (postoperative nausea and vomiting)    Sleep  apnea    Past Surgical History:  Procedure Laterality Date   CARPAL TUNNEL RELEASE Bilateral    COLONOSCOPY  2010   Dr. Lina Sar: mild diverticulosis, next colonoscopy 2020   COLONOSCOPY WITH PROPOFOL N/A 01/21/2020   Rourk: 9 polyps removed, multiple tubular adenomas.  Next colonoscopy in 3 years.   COLONOSCOPY WITH PROPOFOL N/A 04/03/2023   Procedure: COLONOSCOPY WITH PROPOFOL;  Surgeon: Corbin Ade, MD;  [facility];  Service: Endoscopy;  Laterality: N/A;  945AM, ASA 3   ELBOW SURGERY     ESOPHAGEAL BANDING  09/28/2021   Procedure: ESOPHAGEAL BANDING;  Surgeon: Corbin Ade, MD;  [facility];  Service: Endoscopy;;   ESOPHAGOGASTRODUODENOSCOPY (EGD) WITH PROPOFOL N/A 01/21/2020   Rourk: normal   ESOPHAGOGASTRODUODENOSCOPY (EGD) WITH PROPOFOL N/A 09/28/2021   Procedure: ESOPHAGOGASTRODUODENOSCOPY (EGD) WITH PROPOFOL;  Surgeon: Corbin Ade, MD;  [facility];  Service: Endoscopy;  Laterality: N/A;  3:00pm   ESOPHAGOGASTRODUODENOSCOPY (EGD) WITH PROPOFOL N/A 10/31/2021   Procedure: ESOPHAGOGASTRODUODENOSCOPY (EGD) WITH PROPOFOL;  Surgeon: Dolores Frame, MD;  [facility];  Service: Gastroenterology;  Laterality: N/A;   ESOPHAGOGASTRODUODENOSCOPY (EGD) WITH PROPOFOL N/A 05/23/2022   Procedure: ESOPHAGOGASTRODUODENOSCOPY (EGD) WITH PROPOFOL;  Surgeon: Corbin Ade, MD;  [facility];  Service: Endoscopy;  Laterality: N/A;  8:30am, asa 3   NECK SURGERY     c4   POLYPECTOMY  01/21/2020   Procedure: POLYPECTOMY;  Surgeon: Corbin Ade, MD;  [facility];  Service: Endoscopy;;  colon   POLYPECTOMY  04/03/2023   Procedure: POLYPECTOMY;  Surgeon: Corbin Ade, MD;  [facility];  Service: Endoscopy;;   Family History  Problem Relation Age of Onset   Heart attack Father    Heart Problems Brother        Had open heart surgery   Colon cancer Neg Hx    Social History   Socioeconomic  History   Marital status: Married    Spouse name: Not on file   Number of children: Not on file   Years of education: Not on file   Highest education level: Not on file  Occupational History   Not on file  Tobacco Use   Smoking status: Former    Current packs/day: 0.00    Average packs/day: 1 pack/day for 36.0 years (36.0 ttl pk-yrs)    Types: Cigarettes    Start date: 03/11/1980    Quit date: 03/11/2016    Years since quitting: 7.4    Passive exposure: Past   Smokeless tobacco: Never  Vaping Use   Vaping status: Never Used  Substance and Sexual Activity   Alcohol use: Not Currently    Comment: drinks a fifth of liquor per week; inpatient rehab 08/2019. denied 10/14/19. No etoh since 03/2021 (confirmed 02/12/23)   Drug use: No   Sexual activity: Yes  Other Topics Concern   Not on file  Social History Narrative   Wife, Austin Villacis NP [facility] of [facility]   Social Drivers of Health   Financial Resource Strain: Low Risk  (08/24/2022)   Overall Financial Resource Strain (CARDIA)    Difficulty of Paying Living Expenses: Not hard at all  Food Insecurity: No Food Insecurity (08/24/2022)   Hunger Vital Sign    Worried About Running Out of Food in the Last Year: Never true    Ran Out of Food in the Last Year: Never true  Transportation Needs: No Transportation Needs (08/24/2022)   PRAPARE - Administrator, Civil Service (Medical): No    Lack of Transportation (Non-Medical): No  Physical Activity: Inactive (08/24/2022)   Exercise Vital Sign    Days of Exercise per Week: 0 days    Minutes of Exercise per Session: 0 min  Stress: No Stress Concern Present (08/24/2022)   [employer] of Occupational Health - Occupational Stress Questionnaire    Feeling of Stress : Not at all  Social Connections: Socially Integrated (08/24/2022)   Social Connection and Isolation Panel [NHANES]    Frequency of Communication with Friends and Family: More than three times a week     Frequency of Social Gatherings with Friends and Family: More than three times a week    Attends Religious Services: More than 4 times per year    Active Member of [employer] or Organizations: Yes  Attends Engineer, structural: More than 4 times per year    Marital Status: Married    Tobacco Counseling Counseling given: Not Answered    Clinical Intake:  Pre-visit preparation completed: Yes  Pain : No/denies pain     BMI - recorded: 31.96 Nutritional Status: BMI > 30  Obese Nutritional Risks: None Diabetes: Yes CBG done?: No  Lab Results  Component Value Date   HGBA1C 6.0 (H) 02/25/2023   HGBA1C 8.6 (H) 07/20/2022   HGBA1C 6.4 (H) 03/02/2022     How often do you need to have someone help you when you read instructions, pamphlets, or other written materials from your doctor or pharmacy?: 1 - Never  Interpreter Needed?: No  Information entered by :: Michaelia Beilfuss   Activities of Daily Living     08/30/2023   11:32 AM 04/01/2023    1:16 PM  In your present state of health, do you have any difficulty performing the following activities:  Hearing? 1   Comment yes hearing aids   Vision? 0   Difficulty concentrating or making decisions? 0   Walking or climbing stairs? 0   Dressing or bathing? 0   Doing errands, shopping? 0 0  Preparing Food and eating ? N   Using the Toilet? N   In the past six months, have you accidently leaked urine? N   Do you have problems with loss of bowel control? N   Managing your Medications? N   Managing your Finances? N   Housekeeping or managing your Housekeeping? N     Patient Care Team: Babs Sciara, MD as PCP - General (Family Medicine) Randa Lynn, MD as Consulting Physician (Nephrology) Jena Gauss Gerrit Friends, MD as Consulting Physician (Gastroenterology) Annamarie Major, CRNP as Nurse Practitioner (Nurse Practitioner) Daisy Lazar, DO (Optometry) Vickki Hearing, MD as Consulting Physician (Orthopedic  Surgery) Darolyn Rua as Physician Assistant (Oncology) Zamor, Harlan Stains, MD as Attending Physician (Internal Medicine)  Indicate any recent Medical Services you may have received from other than [facility] providers in the past year (date may be approximate).     Assessment:   This is a routine wellness examination for Austin Morgan.  Hearing/Vision screen Hearing Screening - Comments:: Hearing aids  Vision Screening - Comments:: Wears glasses  Sees dr Charise Killian Ginette Pitman [place]   Goals Addressed             This Visit's Progress    Remain active and independent   On track      Depression Screen     02/25/2023    9:18 AM 10/25/2022   11:43 AM 08/24/2022    1:17 PM 03/23/2022    3:11 PM 03/02/2022   11:20 AM 12/05/2020    4:12 PM 09/18/2019    1:49 PM  PHQ 2/9 Scores  PHQ - 2 Score 0 0 0 0 0 0 0  PHQ- 9 Score  3     3    Fall Risk     08/30/2023   11:33 AM 02/25/2023    9:17 AM 10/25/2022   11:43 AM 08/24/2022    1:16 PM 03/23/2022    3:06 PM  Fall Risk   Falls in the past year? 0 1 1 0 0  Number falls in past yr: 0 0 0 0 0  Injury with Fall? 0 1 0 0 0  Risk for fall due to : No Fall Risks  History of fall(s) No Fall Risks No Fall Risks  Follow up Falls evaluation completed  Falls evaluation completed Falls prevention discussed;Education provided;Falls evaluation completed Falls evaluation completed    MEDICARE RISK AT HOME:  Medicare Risk at Home Any stairs in or around the home?: No If so, are there any without handrails?: No Home free of loose throw rugs in walkways, pet beds, electrical cords, etc?: No Adequate lighting in your home to reduce risk of falls?: No Life alert?: No Use of a cane, walker or w/c?: No Grab bars in the bathroom?: Yes Shower chair or bench in shower?: Yes Elevated toilet seat or a handicapped toilet?: Yes  TIMED UP AND GO:  Was the test performed?  no  Cognitive Function: no         08/30/2023   11:29 AM 08/24/2022     1:19 PM  6CIT Screen  What Year? 0 points 0 points  What month? 0 points 0 points  What time? 0 points 0 points  Count back from 20 0 points 0 points  Months in reverse 0 points 0 points  Repeat phrase 0 points 0 points  Total Score 0 points 0 points    Immunizations Immunization History  Administered Date(s) Administered   Fluad Quad(high Dose 65+) 03/02/2022   Fluad Trivalent(High Dose 65+) 02/25/2023   Influenza,inj,Quad PF,6+ Mos 04/15/2015, 06/29/2016, 02/25/2017   Influenza-Unspecified 07/12/2012, 04/11/2014, 04/08/2018   PFIZER(Purple Top)SARS-COV-2 Vaccination 09/07/2019, 10/02/2019   PNEUMOCOCCAL CONJUGATE-20 09/14/2021   Pneumococcal Polysaccharide-23 06/21/2014   Td 03/02/2022    Screening Tests Health Maintenance  Topic Date Due   Zoster Vaccines- Shingrix (1 of 2) Never done   Lung Cancer Screening  Never done   COVID-19 Vaccine (3 - Pfizer risk series) 10/30/2019   FOOT EXAM  03/03/2023   Diabetic kidney evaluation - Urine ACR  05/23/2023   Medicare Annual Wellness (AWV)  08/24/2023   HEMOGLOBIN A1C  08/25/2023   OPHTHALMOLOGY EXAM  12/19/2023   Diabetic kidney evaluation - eGFR measurement  07/31/2024   Colonoscopy  04/02/2026   DTaP/Tdap/Td (2 - Tdap) 03/02/2032   Pneumonia Vaccine 63+ Years old  Completed   INFLUENZA VACCINE  Completed   Hepatitis C Screening  Completed   HPV VACCINES  Aged Out    Health Maintenance: patient will discuss things that are due at the next appointment   Health Maintenance Due  Topic Date Due   Zoster Vaccines- Shingrix (1 of 2) Never done   Lung Cancer Screening  Never done   COVID-19 Vaccine (3 - Pfizer risk series) 10/30/2019   FOOT EXAM  03/03/2023   Diabetic kidney evaluation - Urine ACR  05/23/2023   Medicare Annual Wellness (AWV)  08/24/2023   HEMOGLOBIN A1C  08/25/2023   Health Maintenance Items Addressed:  Additional Screening:  Vision Screening: Recommended annual ophthalmology exams for early  detection of glaucoma and other disorders of the eye.  Dental Screening: Recommended annual dental exams for proper oral hygiene  Community Resource Referral / Chronic Care Management: CRR required this visit?  No   CCM required this visit?  No     Plan:     I have personally reviewed and noted the following in the patient's chart:   Medical and social history Use of alcohol, tobacco or illicit drugs  Current medications and supplements including opioid prescriptions. Patient is not currently taking opioid prescriptions. Functional ability and status Nutritional status Physical activity Advanced directives List of other physicians Hospitalizations, surgeries, and ER visits in previous 12 months Vitals Screenings to include cognitive,  depression, and falls Referrals and appointments  In addition, I have reviewed and discussed with patient certain preventive protocols, quality metrics, and best practice recommendations. A written personalized care plan for preventive services as well as general preventive health recommendations were provided to patient.     Rudi Heap, [place]   08/30/2023   After Visit Summary: (MyChart) Due to this being a telephonic visit, the after visit summary with patients personalized plan was offered to patient via MyChart   Notes: Please refer to Routing Comments.

## 2023-09-04 ENCOUNTER — Other Ambulatory Visit: Payer: Self-pay | Admitting: Family Medicine

## 2023-09-04 DIAGNOSIS — G479 Sleep disorder, unspecified: Secondary | ICD-10-CM

## 2023-09-08 DIAGNOSIS — E1169 Type 2 diabetes mellitus with other specified complication: Secondary | ICD-10-CM | POA: Diagnosis not present

## 2023-09-10 ENCOUNTER — Other Ambulatory Visit: Payer: Self-pay | Admitting: Family Medicine

## 2023-09-10 DIAGNOSIS — N529 Male erectile dysfunction, unspecified: Secondary | ICD-10-CM

## 2023-09-10 MED ORDER — TADALAFIL 20 MG PO TABS: 20.0000 mg | ORAL_TABLET | ORAL | 0 refills | Status: DC | PRN

## 2023-09-11 DIAGNOSIS — I129 Hypertensive chronic kidney disease with stage 1 through stage 4 chronic kidney disease, or unspecified chronic kidney disease: Secondary | ICD-10-CM | POA: Diagnosis not present

## 2023-09-11 DIAGNOSIS — K7030 Alcoholic cirrhosis of liver without ascites: Secondary | ICD-10-CM | POA: Diagnosis not present

## 2023-09-11 DIAGNOSIS — D638 Anemia in other chronic diseases classified elsewhere: Secondary | ICD-10-CM | POA: Diagnosis not present

## 2023-09-11 DIAGNOSIS — N1832 Chronic kidney disease, stage 3b: Secondary | ICD-10-CM | POA: Diagnosis not present

## 2023-10-01 ENCOUNTER — Telehealth: Payer: Self-pay | Admitting: Family Medicine

## 2023-10-01 NOTE — Telephone Encounter (Signed)
 Nurses Patient will need A1c, lipid, met 7, PSA before his follow-up visit in June please  Diagnosis diabetes, hyperlipidemia, high risk med, screening prostate cancer

## 2023-10-02 ENCOUNTER — Other Ambulatory Visit: Payer: Self-pay

## 2023-10-02 DIAGNOSIS — I10 Essential (primary) hypertension: Secondary | ICD-10-CM

## 2023-10-02 DIAGNOSIS — E785 Hyperlipidemia, unspecified: Secondary | ICD-10-CM

## 2023-10-02 DIAGNOSIS — E119 Type 2 diabetes mellitus without complications: Secondary | ICD-10-CM

## 2023-10-02 DIAGNOSIS — Z79899 Other long term (current) drug therapy: Secondary | ICD-10-CM

## 2023-10-02 DIAGNOSIS — Z125 Encounter for screening for malignant neoplasm of prostate: Secondary | ICD-10-CM

## 2023-10-02 NOTE — Telephone Encounter (Signed)
 Pt has been informed per provider lab orders

## 2023-10-08 ENCOUNTER — Other Ambulatory Visit: Payer: Self-pay | Admitting: Family Medicine

## 2023-10-08 DIAGNOSIS — E119 Type 2 diabetes mellitus without complications: Secondary | ICD-10-CM

## 2023-10-14 ENCOUNTER — Other Ambulatory Visit (HOSPITAL_COMMUNITY): Payer: Self-pay | Admitting: Nurse Practitioner

## 2023-10-14 DIAGNOSIS — K7030 Alcoholic cirrhosis of liver without ascites: Secondary | ICD-10-CM | POA: Diagnosis not present

## 2023-10-14 DIAGNOSIS — K7682 Hepatic encephalopathy: Secondary | ICD-10-CM | POA: Diagnosis not present

## 2023-10-14 DIAGNOSIS — I8510 Secondary esophageal varices without bleeding: Secondary | ICD-10-CM | POA: Diagnosis not present

## 2023-10-18 ENCOUNTER — Other Ambulatory Visit: Payer: Self-pay | Admitting: Family Medicine

## 2023-10-18 DIAGNOSIS — N529 Male erectile dysfunction, unspecified: Secondary | ICD-10-CM

## 2023-10-28 ENCOUNTER — Ambulatory Visit (HOSPITAL_COMMUNITY)
Admission: RE | Admit: 2023-10-28 | Discharge: 2023-10-28 | Disposition: A | Discharge status: Home / Self Care | Source: Ambulatory Visit | Attending: Nurse Practitioner | Admitting: Nurse Practitioner

## 2023-10-28 DIAGNOSIS — K7030 Alcoholic cirrhosis of liver without ascites: Secondary | ICD-10-CM | POA: Insufficient documentation

## 2023-10-28 DIAGNOSIS — K7689 Other specified diseases of liver: Secondary | ICD-10-CM | POA: Diagnosis not present

## 2023-11-02 ENCOUNTER — Other Ambulatory Visit: Payer: Self-pay | Admitting: Family Medicine

## 2023-11-02 DIAGNOSIS — E119 Type 2 diabetes mellitus without complications: Secondary | ICD-10-CM

## 2023-11-20 DIAGNOSIS — Z79899 Other long term (current) drug therapy: Secondary | ICD-10-CM | POA: Diagnosis not present

## 2023-11-20 DIAGNOSIS — Z125 Encounter for screening for malignant neoplasm of prostate: Secondary | ICD-10-CM | POA: Diagnosis not present

## 2023-11-20 DIAGNOSIS — E119 Type 2 diabetes mellitus without complications: Secondary | ICD-10-CM | POA: Diagnosis not present

## 2023-11-20 DIAGNOSIS — E1169 Type 2 diabetes mellitus with other specified complication: Secondary | ICD-10-CM | POA: Diagnosis not present

## 2023-11-20 DIAGNOSIS — E785 Hyperlipidemia, unspecified: Secondary | ICD-10-CM | POA: Diagnosis not present

## 2023-11-20 DIAGNOSIS — I10 Essential (primary) hypertension: Secondary | ICD-10-CM | POA: Diagnosis not present

## 2023-11-21 ENCOUNTER — Ambulatory Visit: Payer: Self-pay | Admitting: Family Medicine

## 2023-11-21 LAB — LIPID PANEL
Chol/HDL Ratio: 3.7 ratio (ref 0.0–5.0)
Cholesterol, Total: 118 mg/dL (ref 100–199)
HDL: 32 mg/dL — ABNORMAL LOW (ref >39)
LDL Chol Calc (NIH): 62 mg/dL (ref 0–99)
Triglycerides: 137 mg/dL (ref 0–149)
VLDL Cholesterol Cal: 24 mg/dL (ref 5–40)

## 2023-11-21 LAB — BASIC METABOLIC PANEL WITH GFR
BUN/Creatinine Ratio: 17 (ref 10–24)
BUN: 31 mg/dL — ABNORMAL HIGH (ref 8–27)
CO2: 21 mmol/L (ref 20–29)
Calcium: 9.1 mg/dL (ref 8.6–10.2)
Chloride: 102 mmol/L (ref 96–106)
Creatinine, Ser: 1.86 mg/dL — ABNORMAL HIGH (ref 0.76–1.27)
Glucose: 153 mg/dL — ABNORMAL HIGH (ref 70–99)
Potassium: 4 mmol/L (ref 3.5–5.2)
Sodium: 139 mmol/L (ref 134–144)
eGFR: 39 mL/min/{1.73_m2} — ABNORMAL LOW (ref >59)

## 2023-11-21 LAB — HEMOGLOBIN A1C
Est. average glucose Bld gHb Est-mCnc: 117 mg/dL
Hgb A1c MFr Bld: 5.7 % — ABNORMAL HIGH (ref 4.8–5.6)

## 2023-11-21 LAB — PSA: Prostate Specific Ag, Serum: 0.6 ng/mL (ref 0.0–4.0)

## 2023-11-25 DIAGNOSIS — E119 Type 2 diabetes mellitus without complications: Secondary | ICD-10-CM | POA: Diagnosis not present

## 2023-11-26 ENCOUNTER — Encounter: Payer: Self-pay | Admitting: Family Medicine

## 2023-11-26 ENCOUNTER — Ambulatory Visit: Admitting: Family Medicine

## 2023-11-26 VITALS — BP 116/65 | HR 60 | Temp 98.4°F | Ht 66.0 in | Wt 194.0 lb

## 2023-11-26 DIAGNOSIS — E119 Type 2 diabetes mellitus without complications: Secondary | ICD-10-CM

## 2023-11-26 DIAGNOSIS — E785 Hyperlipidemia, unspecified: Secondary | ICD-10-CM | POA: Diagnosis not present

## 2023-11-26 DIAGNOSIS — E1169 Type 2 diabetes mellitus with other specified complication: Secondary | ICD-10-CM | POA: Diagnosis not present

## 2023-11-26 DIAGNOSIS — Z794 Long term (current) use of insulin: Secondary | ICD-10-CM | POA: Diagnosis not present

## 2023-11-26 DIAGNOSIS — E1122 Type 2 diabetes mellitus with diabetic chronic kidney disease: Secondary | ICD-10-CM

## 2023-11-26 DIAGNOSIS — N1832 Chronic kidney disease, stage 3b: Secondary | ICD-10-CM | POA: Diagnosis not present

## 2023-11-26 DIAGNOSIS — F1021 Alcohol dependence, in remission: Secondary | ICD-10-CM | POA: Diagnosis not present

## 2023-11-26 DIAGNOSIS — G2581 Restless legs syndrome: Secondary | ICD-10-CM

## 2023-11-26 DIAGNOSIS — G479 Sleep disorder, unspecified: Secondary | ICD-10-CM | POA: Diagnosis not present

## 2023-11-26 MED ORDER — NOVOLOG FLEXPEN 100 UNIT/ML ~~LOC~~ SOPN: PEN_INJECTOR | SUBCUTANEOUS | 0 refills | Status: DC

## 2023-11-26 MED ORDER — SILDENAFIL CITRATE 100 MG PO TABS: 50.0000 mg | ORAL_TABLET | Freq: Every day | ORAL | 11 refills | Status: AC | PRN

## 2023-11-26 MED ORDER — TRAZODONE HCL 100 MG PO TABS: ORAL_TABLET | ORAL | 1 refills | Status: DC

## 2023-11-26 MED ORDER — PRAMIPEXOLE DIHYDROCHLORIDE 0.25 MG PO TABS: 0.2500 mg | ORAL_TABLET | Freq: Every day | ORAL | 2 refills | Status: AC

## 2023-11-26 NOTE — Progress Notes (Signed)
 Subjective:    Patient ID: Austin Morgan, male    DOB: Oct 21, 1955, 68 y.o.   MRN: [id number]  HPI follow up  DM Anemia Hyperlipidemia  Discussed the use of AI scribe software for clinical note transcription with the patient, who gave verbal consent to proceed.  History of Present Illness   Austin Morgan is a 68 year old male with diabetes and cirrhosis who presents for a follow-up visit.  He experiences frequent episodes of hypoglycemia, characterized by visual disturbances such as scotomas when exposed to light and diaphoresis. These episodes occur almost daily, and he uses a continuous glucose monitor for alerts. Some episodes are attributed to missed meals due to being busy. His insulin  regimen includes 60 units of long-acting insulin  in the evenings, recently increased due to snacking habits, and 40 units of short-acting insulin  divided over three meals.  He has a history of cirrhosis and manages his medications by organizing them with his vitamins. He takes diuretics daily, except on workdays, to manage fluid retention associated with cirrhosis. He has not yet scheduled an appointment with the GI specialist.  He has a history of kidney issues and sees a nephrologist every six months. His creatinine levels have improved from 2.33 to 1.86 over the past few years. He reports a weight of 194 pounds, down from 202 pounds last September.  He maintains regular eye exams and has reported issues with his left eye to his ophthalmologist, who noted his diabetes and fluid retention. 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 He has stopped smoking and engages in regular exercise, including simple exercises and weight lifting at home.  No burning or numbness in his feet, but he reports occasional tingling. He experiences vision changes and sweating during hypoglycemic episodes.      Patient has history of cirrhosis doing very well now that he is quit alcohol.  His  overall control is under very good control and he is making good progress.  He will see gastroenterology locally every 6 months  Patient also has underlying chronic congestive heart failure and is stable currently also kidney related issues stable   Review of Systems     Objective:   Physical Exam General-in no acute distress Eyes-no discharge Lungs-respiratory rate normal, CTA CV-no murmurs,RRR Extremities skin warm dry no edema Neuro grossly normal Behavior normal, alert  Results for orders placed or performed in visit on 10/02/23  Hemoglobin A1c   Collection Time: 11/20/23  9:13 AM  Result Value Ref Range   Hgb A1c MFr Bld 5.7 (H) 4.8 - 5.6 %   Est. average glucose Bld gHb Est-mCnc 117 mg/dL  Lipid Panel   Collection Time: 11/20/23  9:13 AM  Result Value Ref Range   Cholesterol, Total 118 100 - 199 mg/dL   Triglycerides 409 0 - 149 mg/dL   HDL 32 (L) >81 mg/dL   VLDL Cholesterol Cal 24 5 - 40 mg/dL   LDL Chol Calc (NIH) 62 0 - 99 mg/dL   Chol/HDL Ratio 3.7 0.0 - 5.0 ratio  Basic Metabolic Panel   Collection Time: 11/20/23  9:13 AM  Result Value Ref Range   Glucose 153 (H) 70 - 99 mg/dL   BUN 31 (H) 8 - 27 mg/dL   Creatinine, Ser 1.91 (H) 0.76 - 1.27 mg/dL   eGFR 39 (L) >47 WG/NFA/2.13   BUN/Creatinine Ratio 17 10 - 24   Sodium 139 134 - 144 mmol/L   Potassium 4.0 3.5 - 5.2  mmol/L   Chloride 102 96 - 106 mmol/L   CO2 21 20 - 29 mmol/L   Calcium  9.1 8.6 - 10.2 mg/dL  PSA   Collection Time: 11/20/23  9:13 AM  Result Value Ref Range   Prostate Specific Ag, Serum 0.6 0.0 - 4.0 ng/mL         Assessment & Plan:  1. Diabetes mellitus without complication (HCC) A1c under reasonably good control patient was encouraged to do the best he can with healthy diet.  Avoid mode of sugars.  Adjust insulin  accordingly.  Follow-up if any ongoing troubles - NOVOLOG  FLEXPEN 100 UNIT/ML FlexPen; INJECT 20 UNITS SUBCUTANEOUSLY IN THE MORNING,10 UNITS WITH LUNCH AND 20 UNITS WITH  SUPPER  Dispense: 15 mL; Refill: 0  2. Sleep disturbance Trazodone  helps he would like to continue this. - traZODone  (DESYREL ) 100 MG tablet; TAKE 1 & 1/2 (ONE & ONE-HALF) TABLETS BY MOUTH EVERY DAY AT BEDTIME  Dispense: 135 tablet; Refill: 1  3. Restless leg syndrome Continue current medication to help some - pramipexole  (MIRAPEX ) 0.25 MG tablet; Take 1 tablet (0.25 mg total) by mouth at bedtime.  Dispense: 90 tablet; Refill: 2  4. Hyperlipidemia associated with type 2 diabetes mellitus (HCC) (Primary) Keep LDL below 70 and if possible below 55  5. Recovering alcoholic in remission [facility]) Doing a great job staying away from alcohol  6. Type 2 diabetes mellitus with stage 3b chronic kidney disease, with long-term current use of insulin  (HCC) Overall good control Sees nephrologist every 6 months Doing well overall continue current meds Lab work before next visit  Assessment and Plan    Diabetes Mellitus A1c at 5.7 indicates good control, but frequent hypoglycemic episodes suggest insulin  overuse. - Continue continuous glucose monitoring. - Adjust insulin  doses to prevent hypoglycemia. - Monitor blood glucose closely, especially when busy or skipping meals. - Report frequent hypoglycemic episodes.  Cirrhosis Liver condition well-managed, referred to [facility] for follow-up. - Schedule appointment with [facility].  Chronic Kidney Disease Improved kidney function with creatinine 1.86 and GFR 39. - Continue nephrology follow-up every six months.  Erectile Dysfunction Cialis  less effective, Viagra  suggested for better response. - Prescribe 100 mg Viagra  with refills. - Advise taking Viagra  45 minutes to an hour before sexual activity. - Instruct not to take Viagra  with Cialis . - Check insurance for coverage options.  General Health Maintenance Maintains healthy lifestyle with controlled blood pressure at 116/65. - Continue regular exercise and healthy eating. - Maintain  annual eye exams. - Avoid smoking.  Follow-up - Schedule follow-up in six months. - Order lab work before next follow-up. - Complete blood work at cancer center.

## 2023-11-28 ENCOUNTER — Inpatient Hospital Stay: Payer: Medicare HMO

## 2023-11-28 ENCOUNTER — Inpatient Hospital Stay: Attending: Hematology

## 2023-11-28 DIAGNOSIS — D696 Thrombocytopenia, unspecified: Secondary | ICD-10-CM | POA: Diagnosis not present

## 2023-11-28 DIAGNOSIS — D500 Iron deficiency anemia secondary to blood loss (chronic): Secondary | ICD-10-CM | POA: Insufficient documentation

## 2023-11-28 DIAGNOSIS — R768 Other specified abnormal immunological findings in serum: Secondary | ICD-10-CM | POA: Diagnosis not present

## 2023-11-28 DIAGNOSIS — D631 Anemia in chronic kidney disease: Secondary | ICD-10-CM | POA: Diagnosis not present

## 2023-11-28 DIAGNOSIS — Z87891 Personal history of nicotine dependence: Secondary | ICD-10-CM | POA: Diagnosis not present

## 2023-11-28 DIAGNOSIS — N1832 Chronic kidney disease, stage 3b: Secondary | ICD-10-CM | POA: Diagnosis not present

## 2023-11-28 LAB — VITAMIN B12: Vitamin B-12: 984 pg/mL — ABNORMAL HIGH (ref 180–914)

## 2023-11-28 LAB — CBC WITH DIFFERENTIAL/PLATELET
Abs Immature Granulocytes: 0.03 10*3/uL (ref 0.00–0.07)
Basophils Absolute: 0 10*3/uL (ref 0.0–0.1)
Basophils Relative: 0 %
Eosinophils Absolute: 0.2 10*3/uL (ref 0.0–0.5)
Eosinophils Relative: 4 %
HCT: 34.8 % — ABNORMAL LOW (ref 39.0–52.0)
Hemoglobin: 12.4 g/dL — ABNORMAL LOW (ref 13.0–17.0)
Immature Granulocytes: 1 %
Lymphocytes Relative: 19 %
Lymphs Abs: 1.1 10*3/uL (ref 0.7–4.0)
MCH: 32.5 pg (ref 26.0–34.0)
MCHC: 35.6 g/dL (ref 30.0–36.0)
MCV: 91.3 fL (ref 80.0–100.0)
Monocytes Absolute: 0.5 10*3/uL (ref 0.1–1.0)
Monocytes Relative: 8 %
Neutro Abs: 4.2 10*3/uL (ref 1.7–7.7)
Neutrophils Relative %: 68 %
Platelets: 88 10*3/uL — ABNORMAL LOW (ref 150–400)
RBC: 3.81 MIL/uL — ABNORMAL LOW (ref 4.22–5.81)
RDW: 12 % (ref 11.5–15.5)
WBC: 6.1 10*3/uL (ref 4.0–10.5)
nRBC: 0 % (ref 0.0–0.2)

## 2023-11-28 LAB — COMPREHENSIVE METABOLIC PANEL WITH GFR
ALT: 21 U/L (ref 0–44)
AST: 21 U/L (ref 15–41)
Albumin: 3.9 g/dL (ref 3.5–5.0)
Alkaline Phosphatase: 97 U/L (ref 38–126)
Anion gap: 11 (ref 5–15)
BUN: 40 mg/dL — ABNORMAL HIGH (ref 8–23)
CO2: 24 mmol/L (ref 22–32)
Calcium: 9.5 mg/dL (ref 8.9–10.3)
Chloride: 102 mmol/L (ref 98–111)
Creatinine, Ser: 2.14 mg/dL — ABNORMAL HIGH (ref 0.61–1.24)
GFR, Estimated: 33 mL/min — ABNORMAL LOW (ref >60)
Glucose, Bld: 105 mg/dL — ABNORMAL HIGH (ref 70–99)
Potassium: 4.1 mmol/L (ref 3.5–5.1)
Sodium: 137 mmol/L (ref 135–145)
Total Bilirubin: 1.1 mg/dL (ref 0.0–1.2)
Total Protein: 7.4 g/dL (ref 6.5–8.1)

## 2023-11-28 LAB — IRON AND TIBC
Iron: 73 ug/dL (ref 45–182)
Saturation Ratios: 25 % (ref 17.9–39.5)
TIBC: 288 ug/dL (ref 250–450)
UIBC: 215 ug/dL

## 2023-11-28 LAB — FERRITIN: Ferritin: 139 ng/mL (ref 24–336)

## 2023-12-01 LAB — METHYLMALONIC ACID, SERUM: Methylmalonic Acid, Quantitative: 351 nmol/L (ref 0–378)

## 2023-12-05 ENCOUNTER — Inpatient Hospital Stay: Payer: Medicare HMO | Admitting: Oncology

## 2023-12-05 DIAGNOSIS — N1832 Chronic kidney disease, stage 3b: Secondary | ICD-10-CM | POA: Diagnosis not present

## 2023-12-05 DIAGNOSIS — D696 Thrombocytopenia, unspecified: Secondary | ICD-10-CM | POA: Diagnosis not present

## 2023-12-05 DIAGNOSIS — R768 Other specified abnormal immunological findings in serum: Secondary | ICD-10-CM | POA: Diagnosis not present

## 2023-12-05 DIAGNOSIS — Z87891 Personal history of nicotine dependence: Secondary | ICD-10-CM | POA: Diagnosis not present

## 2023-12-05 DIAGNOSIS — D500 Iron deficiency anemia secondary to blood loss (chronic): Secondary | ICD-10-CM | POA: Diagnosis not present

## 2023-12-05 DIAGNOSIS — D631 Anemia in chronic kidney disease: Secondary | ICD-10-CM | POA: Diagnosis not present

## 2023-12-05 NOTE — Progress Notes (Signed)
 [facility] [address], [place] [address]   CLINIC:  Medical Oncology/Hematology  PCP:  Alphonsa Glendia LABOR, MD [address] [place] [address] [phone]   REASON FOR VISIT:  Follow-up for normocytic anemia (iron  deficiency + CKD), thrombocytopenia, and leukopenia   CURRENT THERAPY: Intermittent iron  infusions  INTERVAL HISTORY:   Austin Morgan 68 y.o. male returns for routine follow-up of his iron  deficiency anemia, anemia of CKD, thrombocytopenia, and leukopenia.  He was last seen by me on 08/08/2023.  He denies any interval hospitalizations, surgeries or changes to his baseline health.  Patient had a colonoscopy on 04/03/2023 by Dr. Shaaron.  Pathology revealed several tubular adenoma without high-grade dysplasia or malignancy.   He reports overall doing well.  Denies any bright red blood per rectum, hematochezia or melena.  He has occasional numbness and tingling in his fingers. He continues to care for his mother-in-law  He received 1 dose of IV Venofer  300 mg on 12/06/2022 with good tolerance.  Has been taken vitamins from [place] and he thinks this has improved his lab work tremendously.  He reports he also is no longer on a statin due to these vitamins.  He reports easy bruising and bleeding when he hits himself on something.  He denies any spontaneous bleeding such as nosebleeds. No frequent infections or B symptoms such as fever, chills, night sweats, unintentional weight loss.  He denies any new bone pain or neurologic changes.  He is taking vitamin B12 twice daily.  Reports he is no longer followed by the kidney specialist at [facility].  He has been discharged back to his gastroenterologist here locally.  He has not had his follow-up appointment yet but will do so in the next few weeks.  He has 75% energy and 100% appetite.  He denies any pain.  ASSESSMENT & PLAN:  1.  Normocytic anemia secondary to iron  deficiency and CKD stage IIIb/IV -   Normocytic anemia since 2021, with significant worsening of his anemia in April 2023 (Hgb 5.6) during admission to hospital for GI bleeding.   - Hematology work-up (12/20/2021): Reticulocytes 1.5% (hypoproliferative in the setting of moderate anemia) LDH normal. Ferritin 31, iron  saturation 21%. Normal folate, copper . Normal B12 781 with mildly elevated methylmalonic acid 411. SPEP negative.  Immunofixation shows polyclonal gammopathy. LDH normal.   - Hospitalized in April 2023 for GI bleeding secondary to varices, requiring esophageal variceal banding and PRBC transfusion x 3.  He was hospitalized again in May 2023 with volume overload and symptomatic anemia requiring repeat EGD (10/31/2021) which showed portal gastropathy and grade 1 varices - he received Venofer  300 mg during hospitalization in May. - Follows with [facility] Kathye Kerns, PA-C and Dr. Shaaron) for his alcohol-related liver cirrhosis and GI bleeding. - He has been taking oral iron  supplementation since June 2023 - He is taking vitamin B12 500 mcg 3 times weekly - Most recent IV Venofer  300 mg x  1 on 12/06/22. - Receiving Procrit  via Dr. Rachele, but has not required injection since 02/22/2022 due to Hgb >10 - Denies any recent hematemesis, melena, or hematochezia.  2.  Thrombocytopenia and leukopenia (cirrhosis and splenomegaly) - Intermittent mild leukopenia since April 2023.  He has had mild thrombocytopenia since June 2022, with most recent platelets (12/13/2021) at 49.   - Nephrology testing for hepatitis B, hepatitis C, and HIV in June 2023 was negative - Hematology work-up (12/20/2021): Reticulocytes 1.5% (hypoproliferative in the setting of  moderate anemia) Normal folate, copper . Normal B12 781 with mildly elevated methylmalonic acid 411. SPEP negative for M spike.  Immunofixation shows polyclonal gammopathy.  LDH normal. - No history of autoimmune or connective tissue disorder. - Patient has  liver cirrhosis and splenomegaly - Follows with [facility] Kathye Kerns, PA-C and Dr. Shaaron) for his alcohol-related liver cirrhosis and GI bleeding. - Most recent abdominal ultrasound (12/07/2020) showed splenomegaly measuring 14 cm with volume 678 cc. - Admits to easy bruising, no petechial rash.  Occasional mild epistaxis.  3.  Elevated free light chains - Labs obtained by nephrologist (11/15/2021) show elevated free serum light chains with elevated kappa 168.3, elevated lambda 92.6, and elevated ratio 1.82. - 24-hour urine (11/15/2021) showed elevated urine light chains with kappa 96.75 and lambda 9.02 with ratio 10.73.  Urine immunofixation showed polyclonal light chains but was negative for monoclonal immunoglobulin or Bence-Jones protein. - Hematology work-up (12/20/2021):  SPEP negative for M spike.  Immunofixation shows polyclonal gammopathy.  Elevated kappa light chains 138.0, elevated lambda 78.5, with elevated ratio 1.76 (in keeping with CKD) - He denies any new bone pain or B symptoms.     4.  Other history - PMH: CKD stage 3b, type 2 diabetes mellitus, anemia of chronic disease, hypertension, alcoholic liver cirrhosis, history of GI bleed from esophageal varices, restless legs, sleep apnea, and chronic diastolic CHF. 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 - SUBSTANCE: History of heavy alcohol use for 20 years, but has been sober since October 2022.  He smoked 0.5 PPD cigarettes for 30 years, but quit in 2017.  He has history of drug use many years ago, but denies any IV drug use. - FAMILY: No family history of blood abnormalities or cancer.   PLAN: 1. Iron  deficiency anemia due to chronic blood loss - Differential diagnosis favors iron  deficiency anemia secondary to GI blood loss as well as anemia secondary to his CKD stage IIIb. -Most recent labs 11/28/2023 show  hemoglobin 12.4/MCV 91.3, ferritin 139, iron  sat 25%.  Baseline CKD stage IIIb/IV with creatinine 2.14/GFR 39 vitamin B12 984 with normal MMA. -Recommend he continue oral iron  supplements at this time 4 hours apart from his Protonix . - Continue B12 twice per week.  Will recheck levels in 4 months. -Recommend labs in 4 months with follow-up a week later.  2. Thrombocytopenia (HCC) - Most recent labs (11/28/23): Platelets 88 (96).  Normal WBC and differential.  Vitamin B12 984, MMA is normal 351. -Will continue to monitor platelet counts every 4 months.  3. Elevated serum immunoglobulin free light chains -Elevated light chains secondary to CKD. -No further workup needed at this time.   PLAN SUMMARY: ** Needs THURSDAY MORNING APPOINTMENTS only ** >> No additional IV iron  needed at this time. >> Continue B12 tablets twice per week. >> Labs in 4 months = CMC/D, CMP, ferritin, iron /TIBC, B12, MMA >> Virtual visit in 4 months (1 week after labs)     REVIEW OF SYSTEMS:  Review of Systems  Constitutional:  Positive for fatigue.  Neurological:  Positive for numbness.     PHYSICAL EXAM:  ECOG PERFORMANCE STATUS: 1 - Symptomatic but completely ambulatory  Vitals:   12/05/23 0932  BP: 113/61  Pulse: (!) 57  Resp: 16  Temp: 98.2 F (36.8 C)  SpO2: 100%    Filed Weights   12/05/23 0932  Weight: 192 lb 3.9 oz (87.2 kg)  Physical Exam Constitutional:      Appearance: Normal appearance.   Cardiovascular:     Rate and Rhythm: Normal rate and regular rhythm.  Pulmonary:     Effort: Pulmonary effort is normal.     Breath sounds: Normal breath sounds.  Abdominal:     General: Bowel sounds are normal.     Palpations: Abdomen is soft.   Musculoskeletal:        General: No swelling. Normal range of motion.   Neurological:     Mental Status: He is alert and oriented to person, place, and time. Mental status is at baseline.     PAST MEDICAL/SURGICAL HISTORY:  Past Medical  History:  Diagnosis Date   Anxiety    Arthritis    CHF (congestive heart failure) (HCC)    Cirrhosis (HCC)    Depression    Diabetes mellitus without complication (HCC)    ED (erectile dysfunction)    Gout    H/O ETOH abuse    in remission   Hyperlipidemia    Hypertension    Hypertriglyceridemia    Iron  deficiency anemia due to chronic blood loss 12/20/2021   PONV (postoperative nausea and vomiting)    Sleep apnea    Past Surgical History:  Procedure Laterality Date   CARPAL TUNNEL RELEASE Bilateral    COLONOSCOPY  2010   Dr. Princella Nida: mild diverticulosis, next colonoscopy 2020   COLONOSCOPY WITH PROPOFOL  N/A 01/21/2020   Rourk: 9 polyps removed, multiple tubular adenomas.  Next colonoscopy in 3 years.   COLONOSCOPY WITH PROPOFOL  N/A 04/03/2023   Procedure: COLONOSCOPY WITH PROPOFOL ;  Surgeon: Shaaron Lamar HERO, MD;  [facility];  Service: Endoscopy;  Laterality: N/A;  945AM, ASA 3   ELBOW SURGERY     ESOPHAGEAL BANDING  09/28/2021   Procedure: ESOPHAGEAL BANDING;  Surgeon: Shaaron Lamar HERO, MD;  Location: [facility];  Service: Endoscopy;;   ESOPHAGOGASTRODUODENOSCOPY (EGD) WITH PROPOFOL  N/A 01/21/2020   Rourk: normal   ESOPHAGOGASTRODUODENOSCOPY (EGD) WITH PROPOFOL  N/A 09/28/2021   Procedure: ESOPHAGOGASTRODUODENOSCOPY (EGD) WITH PROPOFOL ;  Surgeon: Shaaron Lamar HERO, MD;  [facility];  Service: Endoscopy;  Laterality: N/A;  3:00pm   ESOPHAGOGASTRODUODENOSCOPY (EGD) WITH PROPOFOL  N/A 10/31/2021   Procedure: ESOPHAGOGASTRODUODENOSCOPY (EGD) WITH PROPOFOL ;  Surgeon: Eartha Angelia Sieving, MD;  [facility]: [facility];  Service: Gastroenterology;  Laterality: N/A;   ESOPHAGOGASTRODUODENOSCOPY (EGD) WITH PROPOFOL  N/A 05/23/2022   Procedure: ESOPHAGOGASTRODUODENOSCOPY (EGD) WITH PROPOFOL ;  Surgeon: Shaaron Lamar HERO, MD;  [facility];  Service: Endoscopy;  Laterality: N/A;  8:30am, asa 3   NECK SURGERY     c4   POLYPECTOMY  01/21/2020    Procedure: POLYPECTOMY;  Surgeon: Shaaron Lamar HERO, MD;  [facility];  Service: Endoscopy;;  colon   POLYPECTOMY  04/03/2023   Procedure: POLYPECTOMY;  Surgeon: Shaaron Lamar HERO, MD;  [facility];  Service: Endoscopy;;    SOCIAL HISTORY:  Social History   Socioeconomic History   Marital status: Married    Spouse name: Not on file   Number of children: Not on file   Years of education: Not on file   Highest education level: Not on file  Occupational History   Not on file  Tobacco Use   Smoking status: Former    Current packs/day: 0.00    Average packs/day: 1 pack/day for 36.0 years (36.0 ttl pk-yrs)    Types: Cigarettes    Start date: 03/11/1980    Quit date:  03/11/2016    Years since quitting: 7.7    Passive exposure: Past   Smokeless tobacco: Never  Vaping Use   Vaping status: Never Used  Substance and Sexual Activity   Alcohol use: Not Currently    Comment: drinks a fifth of liquor per week; inpatient rehab 08/2019. denied 10/14/19. No etoh since 03/2021 (confirmed 02/12/23)   Drug use: No   Sexual activity: Yes  Other Topics Concern   Not on file  Social History Narrative   Wife, Anuel Sitter NP [facility] [employer]   Social Drivers of Health   Financial Resource Strain: Low Risk  (08/24/2022)   Overall Financial Resource Strain (CARDIA)    Difficulty of Paying Living Expenses: Not hard at all  Food Insecurity: Low Risk  (10/14/2023)   Received from [facility]   Hunger Vital Sign    Within the past 12 months, you worried that your food would run out before you got money to buy more: Never true    Within the past 12 months, the food you bought just didn't last and you didn't have money to get more. : Never true  Transportation Needs: No Transportation Needs (10/14/2023)   Received from [facility]    In the past 12 months, has lack of reliable transportation kept you from medical appointments, meetings, work or from  getting things needed for daily living? : No  Physical Activity: Inactive (08/24/2022)   Exercise Vital Sign    Days of Exercise per Week: 0 days    Minutes of Exercise per Session: 0 min  Stress: No Stress Concern Present (08/24/2022)   [employer] of Occupational Health - Occupational Stress Questionnaire    Feeling of Stress : Not at all  Social Connections: Socially Integrated (08/24/2022)   Social Connection and Isolation Panel    Frequency of Communication with Friends and Family: More than three times a week    Frequency of Social Gatherings with Friends and Family: More than three times a week    Attends Religious Services: More than 4 times per year    Active Member of [employer] or Organizations: Yes    Attends Banker Meetings: More than 4 times per year    Marital Status: Married  Catering manager Violence: Not At Risk (08/24/2022)   Humiliation, Afraid, Rape, and Kick questionnaire    Fear of Current or Ex-Partner: No    Emotionally Abused: No    Physically Abused: No    Sexually Abused: No    FAMILY HISTORY:  Family History  Problem Relation Age of Onset   Heart attack Father    Heart Problems Brother        Had open heart surgery   Colon cancer Neg Hx     CURRENT MEDICATIONS:  Outpatient Encounter Medications as of 12/05/2023  Medication Sig   albuterol  (VENTOLIN  HFA) 108 (90 Base) MCG/ACT inhaler INHALE 2 PUFFS BY MOUTH EVERY 6 HOURS AS NEEDED FOR WHEEZING   augmented betamethasone dipropionate (DIPROLENE-AF) 0.05 % cream Apply topically 2 (two) times daily as needed.   BD PEN NEEDLE NANO 2ND GEN 32G X 4 MM MISC USE TO ADMINISTER INSULIN    Continuous Blood Gluc Receiver (DEXCOM G6 RECEIVER) DEVI 1 Device by Does not apply route continuous.   Continuous Blood Gluc Sensor (DEXCOM G6 SENSOR) MISC 1 Units by Does not apply route continuous. (Patient taking differently: 1 Units by Does not apply route continuous. G7)   cyanocobalamin  (VITAMIN  B12) 500 MCG  tablet Take 500 mcg by mouth every Monday, Wednesday, and Friday.   diclofenac Sodium (VOLTAREN) 1 % GEL Apply 2 g topically in the morning, at noon, and at bedtime.   ferrous sulfate 325 (65 FE) MG EC tablet Take 325 mg by mouth every morning.   furosemide  (LASIX ) 40 MG tablet Take 1 tablet (40 mg total) by mouth 2 (two) times daily.   insulin  glargine (LANTUS  SOLOSTAR) 100 UNIT/ML Solostar Pen Inject 66 units subcutaneously every evening may titrate up to 80 units   magnesium  oxide (MAG-OX) 400 (240 Mg) MG tablet Take 400 mg by mouth daily.   metolazone (ZAROXOLYN) 5 MG tablet Take 5 mg by mouth every Monday, Wednesday, and Friday.   Multiple Vitamins-Minerals (MULTIVITAMIN WITH MINERALS) tablet Take 1 tablet by mouth daily.   nadolol  (CORGARD ) 40 MG tablet Take 1/2 (one-half) tablet by mouth once daily   NOVOLOG  FLEXPEN 100 UNIT/ML FlexPen INJECT 20 UNITS SUBCUTANEOUSLY IN THE MORNING,10 UNITS WITH LUNCH AND 20 UNITS WITH SUPPER   ondansetron  (ZOFRAN ) 8 MG tablet TAKE 1 TABLET BY MOUTH THREE TIMES DAILY AS NEEDED FOR NAUSEA   pantoprazole  (PROTONIX ) 40 MG tablet Take 1 tablet (40 mg total) by mouth 2 (two) times daily.   pramipexole  (MIRAPEX ) 0.25 MG tablet Take 1 tablet (0.25 mg total) by mouth at bedtime.   rifaximin  (XIFAXAN ) 550 MG TABS tablet Take 1 tablet (550 mg total) by mouth 2 (two) times daily.   sildenafil  (VIAGRA ) 100 MG tablet Take 0.5-1 tablets (50-100 mg total) by mouth daily as needed for erectile dysfunction.   spironolactone  (ALDACTONE ) 25 MG tablet Take 25 mg by mouth every Monday, Wednesday, and Friday.   tadalafil  (CIALIS ) 20 MG tablet TAKE 1 TABLET BY MOUTH AS NEEDED FOR ERECTILE DYSFUNCTION   traZODone  (DESYREL ) 100 MG tablet TAKE 1 & 1/2 (ONE & ONE-HALF) TABLETS BY MOUTH EVERY DAY AT BEDTIME   triamcinolone  cream (KENALOG ) 0.1 % Apply thin amount bid prn   No facility-administered encounter medications on file as of 12/05/2023.    ALLERGIES:  Allergies  Allergen  Reactions   Codeine Nausea And Vomiting    LABORATORY DATA:  I have reviewed the labs as listed.  CBC    Component Value Date/Time   WBC 6.1 11/28/2023 0949   RBC 3.81 (L) 11/28/2023 0949   HGB 12.4 (L) 11/28/2023 0949   HGB 9.1 (L) 03/02/2022 1158   HCT 34.8 (L) 11/28/2023 0949   HCT 26.8 (L) 03/02/2022 1158   PLT 88 (L) 11/28/2023 0949   PLT 128 (L) 03/02/2022 1158   MCV 91.3 11/28/2023 0949   MCV 91 03/02/2022 1158   MCH 32.5 11/28/2023 0949   MCHC 35.6 11/28/2023 0949   RDW 12.0 11/28/2023 0949   RDW 12.2 03/02/2022 1158   LYMPHSABS 1.1 11/28/2023 0949   LYMPHSABS 1.1 03/02/2022 1158   MONOABS 0.5 11/28/2023 0949   EOSABS 0.2 11/28/2023 0949   EOSABS 0.4 03/02/2022 1158   BASOSABS 0.0 11/28/2023 0949   BASOSABS 0.0 03/02/2022 1158      Latest Ref Rng & Units 11/28/2023    9:49 AM 11/20/2023    9:13 AM 08/01/2023    8:57 AM  CMP  Glucose 70 - 99 mg/dL 894  846  785   BUN 8 - 23 mg/dL 40  31  34   Creatinine 0.61 - 1.24 mg/dL 7.85  8.13  7.87   Sodium 135 - 145 mmol/L 137  139  135  Potassium 3.5 - 5.1 mmol/L 4.1  4.0  4.3   Chloride 98 - 111 mmol/L 102  102  102   CO2 22 - 32 mmol/L 24  21  25    Calcium  8.9 - 10.3 mg/dL 9.5  9.1  8.9   Total Protein 6.5 - 8.1 g/dL 7.4   7.6   Total Bilirubin 0.0 - 1.2 mg/dL 1.1   1.0   Alkaline Phos 38 - 126 U/L 97   91   AST 15 - 41 U/L 21   24   ALT 0 - 44 U/L 21   21     DIAGNOSTIC IMAGING:  I have independently reviewed the relevant imaging and discussed with the patient.   WRAP UP:  All questions were answered. The patient knows to call the clinic with any problems, questions or concerns.  Medical decision making: Moderate  Time spent on visit: I spent 25 minutes dedicated to the care of this patient (face-to-face and non-face-to-face) on the date of the encounter to include what is described in the assessment and plan.  Delon FORBES Hope, NP  12/05/23 9:44 AM

## 2023-12-07 DIAGNOSIS — E1169 Type 2 diabetes mellitus with other specified complication: Secondary | ICD-10-CM | POA: Diagnosis not present

## 2023-12-20 ENCOUNTER — Other Ambulatory Visit: Payer: Self-pay | Admitting: Family Medicine

## 2024-01-15 ENCOUNTER — Other Ambulatory Visit: Payer: Self-pay | Admitting: Family Medicine

## 2024-01-22 ENCOUNTER — Other Ambulatory Visit: Payer: Self-pay | Admitting: *Deleted

## 2024-01-25 ENCOUNTER — Other Ambulatory Visit: Payer: Self-pay | Admitting: Family Medicine

## 2024-01-25 DIAGNOSIS — E119 Type 2 diabetes mellitus without complications: Secondary | ICD-10-CM

## 2024-01-27 ENCOUNTER — Other Ambulatory Visit: Payer: Self-pay

## 2024-01-27 DIAGNOSIS — E119 Type 2 diabetes mellitus without complications: Secondary | ICD-10-CM

## 2024-01-27 MED ORDER — NOVOLOG FLEXPEN 100 UNIT/ML ~~LOC~~ SOPN: PEN_INJECTOR | SUBCUTANEOUS | 0 refills | Status: DC

## 2024-02-14 ENCOUNTER — Other Ambulatory Visit: Payer: Self-pay | Admitting: Family Medicine

## 2024-02-19 ENCOUNTER — Other Ambulatory Visit: Payer: Self-pay | Admitting: Family Medicine

## 2024-03-02 DIAGNOSIS — N5201 Erectile dysfunction due to arterial insufficiency: Secondary | ICD-10-CM | POA: Diagnosis not present

## 2024-03-06 DIAGNOSIS — E1169 Type 2 diabetes mellitus with other specified complication: Secondary | ICD-10-CM | POA: Diagnosis not present

## 2024-03-12 ENCOUNTER — Encounter (INDEPENDENT_AMBULATORY_CARE_PROVIDER_SITE_OTHER): Payer: Self-pay | Admitting: *Deleted

## 2024-03-15 ENCOUNTER — Other Ambulatory Visit: Payer: Self-pay | Admitting: Family Medicine

## 2024-03-16 DIAGNOSIS — E211 Secondary hyperparathyroidism, not elsewhere classified: Secondary | ICD-10-CM | POA: Diagnosis not present

## 2024-03-16 DIAGNOSIS — D631 Anemia in chronic kidney disease: Secondary | ICD-10-CM | POA: Diagnosis not present

## 2024-03-16 DIAGNOSIS — N189 Chronic kidney disease, unspecified: Secondary | ICD-10-CM | POA: Diagnosis not present

## 2024-03-16 DIAGNOSIS — E119 Type 2 diabetes mellitus without complications: Secondary | ICD-10-CM | POA: Diagnosis not present

## 2024-03-16 DIAGNOSIS — R809 Proteinuria, unspecified: Secondary | ICD-10-CM | POA: Diagnosis not present

## 2024-03-23 DIAGNOSIS — I5032 Chronic diastolic (congestive) heart failure: Secondary | ICD-10-CM | POA: Diagnosis not present

## 2024-03-23 DIAGNOSIS — E1122 Type 2 diabetes mellitus with diabetic chronic kidney disease: Secondary | ICD-10-CM | POA: Diagnosis not present

## 2024-03-23 DIAGNOSIS — N1832 Chronic kidney disease, stage 3b: Secondary | ICD-10-CM | POA: Diagnosis not present

## 2024-03-23 DIAGNOSIS — D6959 Other secondary thrombocytopenia: Secondary | ICD-10-CM | POA: Diagnosis not present

## 2024-03-31 ENCOUNTER — Other Ambulatory Visit: Payer: Self-pay | Admitting: Family Medicine

## 2024-04-02 ENCOUNTER — Telehealth: Payer: Self-pay

## 2024-04-02 NOTE — Telephone Encounter (Signed)
 Who is your primary care physician: Glendia Fielding  Reasons for the colonoscopy: hx polyps  Have you had a colonoscopy before?  Yes 04/03/23  Do you have family history of colon cancer? no  Previous colonoscopy with polyps removed? yes  Do you have a history colorectal cancer?   no  Are you diabetic? If yes, Type 1 or Type 2?    Yes   Do you have a prosthetic or mechanical heart valve? no  Do you have a pacemaker/defibrillator?   no  Have you had endocarditis/atrial fibrillation? no  Have you had joint replacement within the last 12 months?  no  Do you tend to be constipated or have to use laxatives? no  Do you have any history of drugs or alchohol?  No but alcohol in the past  Do you use supplemental oxygen?  no  Have you had a stroke or heart attack within the last 6 months? no  Do you take weight loss medication?  no       Do you take any blood-thinning medications such as: (aspirin, warfarin, Plavix, Aggrenox)  no  If yes we need the name, milligram, dosage and who is prescribing doctor  Current Outpatient Medications on File Prior to Visit  Medication Sig Dispense Refill   albuterol  (VENTOLIN  HFA) 108 (90 Base) MCG/ACT inhaler INHALE 2 PUFFS BY MOUTH EVERY 6 HOURS AS NEEDED FOR WHEEZING 9 g 0   augmented betamethasone dipropionate (DIPROLENE-AF) 0.05 % cream Apply topically 2 (two) times daily as needed.     BD PEN NEEDLE NANO 2ND GEN 32G X 4 MM MISC USE TO ADMINISTER INSULIN      Continuous Blood Gluc Receiver (DEXCOM G6 RECEIVER) DEVI 1 Device by Does not apply route continuous. 1 each 1   Continuous Blood Gluc Sensor (DEXCOM G6 SENSOR) MISC 1 Units by Does not apply route continuous. (Patient taking differently: 1 Units by Does not apply route continuous. G7) 3 each 1   cyanocobalamin  (VITAMIN B12) 500 MCG tablet Take 500 mcg by mouth every Monday, Wednesday, and Friday.     diclofenac Sodium (VOLTAREN) 1 % GEL Apply 2 g topically in the morning, at noon, and at  bedtime.     ferrous sulfate 325 (65 FE) MG EC tablet Take 325 mg by mouth every morning.     furosemide  (LASIX ) 40 MG tablet Take 1 tablet (40 mg total) by mouth 2 (two) times daily. 30 tablet 1   LANTUS  SOLOSTAR 100 UNIT/ML Solostar Pen INJECT 66 UNITS INTO THE SKIN EVERY EVENING. MAY TITRATE UP TO 80 UNITS. 15 mL 0   magnesium  oxide (MAG-OX) 400 (240 Mg) MG tablet Take 400 mg by mouth daily.     metolazone (ZAROXOLYN) 5 MG tablet Take 5 mg by mouth every Monday, Wednesday, and Friday.     Multiple Vitamins-Minerals (MULTIVITAMIN WITH MINERALS) tablet Take 1 tablet by mouth daily.     nadolol  (CORGARD ) 40 MG tablet Take 1/2 (one-half) tablet by mouth once daily 45 tablet 3   NOVOLOG  FLEXPEN 100 UNIT/ML FlexPen INJECT 20 UNITS SUBCUTANEOUSLY IN THE MORNING,10 UNITS WITH LUNCH AND 20 UNITS WITH SUPPER 15 mL 0   ondansetron  (ZOFRAN ) 8 MG tablet TAKE 1 TABLET BY MOUTH THREE TIMES DAILY AS NEEDED FOR NAUSEA 20 tablet 3   pantoprazole  (PROTONIX ) 40 MG tablet Take 1 tablet by mouth twice daily 60 tablet 0   pramipexole  (MIRAPEX ) 0.25 MG tablet Take 1 tablet (0.25 mg total) by mouth at bedtime. 90 tablet 2  rifaximin  (XIFAXAN ) 550 MG TABS tablet Take 1 tablet (550 mg total) by mouth 2 (two) times daily. 60 tablet 11   sildenafil  (VIAGRA ) 100 MG tablet Take 0.5-1 tablets (50-100 mg total) by mouth daily as needed for erectile dysfunction. 5 tablet 11   spironolactone  (ALDACTONE ) 25 MG tablet Take 25 mg by mouth every Monday, Wednesday, and Friday.     tadalafil  (CIALIS ) 20 MG tablet TAKE 1 TABLET BY MOUTH AS NEEDED FOR ERECTILE DYSFUNCTION 6 tablet 0   traZODone  (DESYREL ) 100 MG tablet TAKE 1 & 1/2 (ONE & ONE-HALF) TABLETS BY MOUTH EVERY DAY AT BEDTIME 135 tablet 1   triamcinolone  cream (KENALOG ) 0.1 % Apply thin amount bid prn 80 g 4   No current facility-administered medications on file prior to visit.    Allergies  Allergen Reactions   Codeine Nausea And Vomiting     [facility]acy[facility]  Primary Insurance Name: [employer] [id number]  Best number where you can be reached: [phone]

## 2024-04-03 ENCOUNTER — Inpatient Hospital Stay: Attending: Hematology

## 2024-04-03 DIAGNOSIS — D500 Iron deficiency anemia secondary to blood loss (chronic): Secondary | ICD-10-CM | POA: Diagnosis not present

## 2024-04-03 DIAGNOSIS — D696 Thrombocytopenia, unspecified: Secondary | ICD-10-CM | POA: Diagnosis not present

## 2024-04-03 DIAGNOSIS — N184 Chronic kidney disease, stage 4 (severe): Secondary | ICD-10-CM | POA: Insufficient documentation

## 2024-04-03 DIAGNOSIS — R7689 Other specified abnormal immunological findings in serum: Secondary | ICD-10-CM

## 2024-04-03 DIAGNOSIS — D631 Anemia in chronic kidney disease: Secondary | ICD-10-CM | POA: Insufficient documentation

## 2024-04-03 LAB — COMPREHENSIVE METABOLIC PANEL WITH GFR
ALT: 21 U/L (ref 0–44)
AST: 25 U/L (ref 15–41)
Albumin: 4.2 g/dL (ref 3.5–5.0)
Alkaline Phosphatase: 108 U/L (ref 38–126)
Anion gap: 10 (ref 5–15)
BUN: 29 mg/dL — ABNORMAL HIGH (ref 8–23)
CO2: 26 mmol/L (ref 22–32)
Calcium: 9 mg/dL (ref 8.9–10.3)
Chloride: 103 mmol/L (ref 98–111)
Creatinine, Ser: 1.93 mg/dL — ABNORMAL HIGH (ref 0.61–1.24)
GFR, Estimated: 37 mL/min — ABNORMAL LOW (ref >60)
Glucose, Bld: 219 mg/dL — ABNORMAL HIGH (ref 70–99)
Potassium: 4.6 mmol/L (ref 3.5–5.1)
Sodium: 138 mmol/L (ref 135–145)
Total Bilirubin: 0.7 mg/dL (ref 0.0–1.2)
Total Protein: 7.1 g/dL (ref 6.5–8.1)

## 2024-04-03 LAB — CBC WITH DIFFERENTIAL/PLATELET
Abs Immature Granulocytes: 0.01 K/uL (ref 0.00–0.07)
Basophils Absolute: 0 K/uL (ref 0.0–0.1)
Basophils Relative: 1 %
Eosinophils Absolute: 0.2 K/uL (ref 0.0–0.5)
Eosinophils Relative: 5 %
HCT: 36.5 % — ABNORMAL LOW (ref 39.0–52.0)
Hemoglobin: 12.8 g/dL — ABNORMAL LOW (ref 13.0–17.0)
Immature Granulocytes: 0 %
Lymphocytes Relative: 24 %
Lymphs Abs: 1.1 K/uL (ref 0.7–4.0)
MCH: 33.2 pg (ref 26.0–34.0)
MCHC: 35.1 g/dL (ref 30.0–36.0)
MCV: 94.6 fL (ref 80.0–100.0)
Monocytes Absolute: 0.3 K/uL (ref 0.1–1.0)
Monocytes Relative: 6 %
Neutro Abs: 2.9 K/uL (ref 1.7–7.7)
Neutrophils Relative %: 64 %
Platelets: 80 K/uL — ABNORMAL LOW (ref 150–400)
RBC: 3.86 MIL/uL — ABNORMAL LOW (ref 4.22–5.81)
RDW: 12.6 % (ref 11.5–15.5)
WBC: 4.5 K/uL (ref 4.0–10.5)
nRBC: 0 % (ref 0.0–0.2)

## 2024-04-03 LAB — FERRITIN: Ferritin: 171 ng/mL (ref 24–336)

## 2024-04-03 LAB — IRON AND TIBC
Iron: 89 ug/dL (ref 45–182)
Saturation Ratios: 32 % (ref 17.9–39.5)
TIBC: 274 ug/dL (ref 250–450)
UIBC: 186 ug/dL

## 2024-04-03 LAB — VITAMIN B12: Vitamin B-12: 1052 pg/mL — ABNORMAL HIGH (ref 180–914)

## 2024-04-10 ENCOUNTER — Inpatient Hospital Stay: Admitting: Oncology

## 2024-04-10 DIAGNOSIS — D696 Thrombocytopenia, unspecified: Secondary | ICD-10-CM | POA: Diagnosis not present

## 2024-04-10 DIAGNOSIS — D500 Iron deficiency anemia secondary to blood loss (chronic): Secondary | ICD-10-CM

## 2024-04-10 NOTE — Progress Notes (Signed)
 [facility] OFFICE PROGRESS NOTE  Alphonsa Glendia LABOR, MD  ASSESSMENT & PLAN:   I connected with Austin Morgan on 04/10/24 at  2:00 PM EDT by telephone visit and verified that I am speaking with the correct person using two identifiers.   I discussed the limitations, risks, security and privacy concerns of performing an evaluation and management service by telemedicine and the availability of in-person appointments. I also discussed with the patient that there may be a patient responsible charge related to this service. The patient expressed understanding and agreed to proceed.   Other persons participating in the visit and their role in the encounter: Patient, NP   Patient's location: Home  Provider's location: Clinic   Assessment & Plan Iron  deficiency anemia due to chronic blood loss - Differential diagnosis favors iron  deficiency anemia secondary to GI blood loss as well as anemia secondary to his CKD stage IIIb. - Most recent labs from 04/02/2024 show more or less stable results.  Iron  levels have improved and so has his hemoglobin.  Kidney function has also improved.  B12 is slightly up. -Reports he takes an iron  tablet every day and B12 when he remembers.  Denies any GI upset. -We discussed taking iron  every other day along with vitamin C  and take B12 twice per week. -Recommend labs in 6 months with follow-up a week later. Thrombocytopenia - Most recent labs show worsening thrombocytopenia with platelets down to 80.  He denies any bleeding.  He does report some easy bruising but overall this is stable.  Continue to monitor.   Orders Placed This Encounter  Procedures   CBC with Differential    Standing Status:   Future    Expected Date:   10/08/2024    Expiration Date:   01/06/2025   Comprehensive metabolic panel    Standing Status:   Future    Expected Date:   10/08/2024    Expiration Date:   01/06/2025   Ferritin    Standing Status:   Future    Expected Date:    10/08/2024    Expiration Date:   01/06/2025   Iron  and TIBC ([facility] ONLY)    Standing Status:   Future    Expected Date:   10/08/2024    Expiration Date:   01/06/2025   Vitamin B12    Standing Status:   Future    Expected Date:   10/08/2024    Expiration Date:   01/06/2025   Methylmalonic acid, serum    Standing Status:   Future    Expected Date:   10/08/2024    Expiration Date:   01/06/2025    INTERVAL HISTORY: Austin Morgan 68 y.o. male returns for routine follow-up of his iron  deficiency anemia, anemia of CKD, thrombocytopenia, and leukopenia.   He denies any interval hospitalizations, surgeries or changes to his baseline health.   Patient had a colonoscopy on 04/03/2023 by Dr. Shaaron.  Pathology revealed several tubular adenoma without high-grade dysplasia or malignancy.   He reports overall doing well.  Denies any bright red blood per rectum, hematochezia or melena.  He has occasional numbness and tingling in his fingers. He continues to care for his mother-in-law.    He received 1 dose of IV Venofer  300 mg on 12/06/2022 with good tolerance.   Has been taken vitamins from [place] and he thinks this has improved his lab work tremendously.  He reports he also is no longer on a statin due to these vitamins.  He reports easy bruising and bleeding when he hits himself on something.  He denies any spontaneous bleeding such as nosebleeds. No frequent infections or B symptoms such as fever, chills, night sweats, unintentional weight loss.  He denies any new bone pain or neurologic changes.  He is taking vitamin B12 twice daily.   Reports he is no longer followed by the GI specialist at [facility].  He has been discharged back to his gastroenterologist here locally.  He has not had his follow-up appointment yet but will do so in the next few weeks.   He has 75% energy and 100% appetite.  He denies any pain.  We reviewed CBC, CMP, Ferritin, Iron  panel and B12.  SUMMARY OF  HEMATOLOGIC HISTORY: Oncology History Overview Note  1.  Normocytic anemia secondary to iron  deficiency and CKD stage IIIb/IV -  Normocytic anemia since 2021, with significant worsening of his anemia in April 2023 (Hgb 5.6) during admission to hospital for GI bleeding.   - Hematology work-up (12/20/2021): Reticulocytes 1.5% (hypoproliferative in the setting of moderate anemia) LDH normal. Ferritin 31, iron  saturation 21%. Normal folate, copper . Normal B12 781 with mildly elevated methylmalonic acid 411. SPEP negative.  Immunofixation shows polyclonal gammopathy. LDH normal.   - Hospitalized in April 2023 for GI bleeding secondary to varices, requiring esophageal variceal banding and PRBC transfusion x 3.  He was hospitalized again in May 2023 with volume overload and symptomatic anemia requiring repeat EGD (10/31/2021) which showed portal gastropathy and grade 1 varices - he received Venofer  300 mg during hospitalization in May. - Follows with [facility] Kathye Kerns, PA-C and Dr. Shaaron) for his alcohol-related liver cirrhosis and GI bleeding.    No history exists.     CBC    Component Value Date/Time   WBC 4.5 04/03/2024 1051   RBC 3.86 (L) 04/03/2024 1051   HGB 12.8 (L) 04/03/2024 1051   HGB 9.1 (L) 03/02/2022 1158   HCT 36.5 (L) 04/03/2024 1051   HCT 26.8 (L) 03/02/2022 1158   PLT 80 (L) 04/03/2024 1051   PLT 128 (L) 03/02/2022 1158   MCV 94.6 04/03/2024 1051   MCV 91 03/02/2022 1158   MCH 33.2 04/03/2024 1051   MCHC 35.1 04/03/2024 1051   RDW 12.6 04/03/2024 1051   RDW 12.2 03/02/2022 1158   LYMPHSABS 1.1 04/03/2024 1051   LYMPHSABS 1.1 03/02/2022 1158   MONOABS 0.3 04/03/2024 1051   EOSABS 0.2 04/03/2024 1051   EOSABS 0.4 03/02/2022 1158   BASOSABS 0.0 04/03/2024 1051   BASOSABS 0.0 03/02/2022 1158       Latest Ref Rng & Units 04/03/2024   10:51 AM 11/28/2023    9:49 AM 11/20/2023    9:13 AM  CMP  Glucose 70 - 99 mg/dL 780  894  846    BUN 8 - 23 mg/dL 29  40  31   Creatinine 0.61 - 1.24 mg/dL 8.06  7.85  8.13   Sodium 135 - 145 mmol/L 138  137  139   Potassium 3.5 - 5.1 mmol/L 4.6  4.1  4.0   Chloride 98 - 111 mmol/L 103  102  102   CO2 22 - 32 mmol/L 26  24  21    Calcium  8.9 - 10.3 mg/dL 9.0  9.5  9.1   Total Protein 6.5 - 8.1 g/dL 7.1  7.4    Total Bilirubin 0.0 - 1.2 mg/dL 0.7  1.1    Alkaline Phos 38 - 126 U/L 108  97  AST 15 - 41 U/L 25  21    ALT 0 - 44 U/L 21  21       Lab Results  Component Value Date   FERRITIN 171 04/03/2024   VITAMINB12 1,052 (H) 04/03/2024    There were no vitals filed for this visit.  Review of System:  Review of Systems  Constitutional:  Negative for malaise/fatigue.    Physical Exam: Physical Exam Neurological:     Mental Status: He is alert and oriented to person, place, and time.     I provided 20 minutes of non face-to-face telephone visit time during this encounter, and > 50% was spent counseling as documented under my assessment & plan.   Delon Hope, NP 04/10/2024 2:29 PM

## 2024-04-10 NOTE — Assessment & Plan Note (Addendum)
-   Most recent labs show worsening thrombocytopenia with platelets down to 80.  He denies any bleeding.  He does report some easy bruising but overall this is stable.  Continue to monitor.

## 2024-04-10 NOTE — Assessment & Plan Note (Addendum)
-   Differential diagnosis favors iron  deficiency anemia secondary to GI blood loss as well as anemia secondary to his CKD stage IIIb. - Most recent labs from 04/02/2024 show more or less stable results.  Iron  levels have improved and so has his hemoglobin.  Kidney function has also improved.  B12 is slightly up. -Reports he takes an iron  tablet every day and B12 when he remembers.  Denies any GI upset. -We discussed taking iron  every other day along with vitamin C  and take B12 twice per week. -Recommend labs in 6 months with follow-up a week later.

## 2024-04-11 LAB — METHYLMALONIC ACID, SERUM: Methylmalonic Acid, Quantitative: 300 nmol/L (ref 0–378)

## 2024-04-12 ENCOUNTER — Other Ambulatory Visit: Payer: Self-pay | Admitting: Family Medicine

## 2024-04-12 DIAGNOSIS — E119 Type 2 diabetes mellitus without complications: Secondary | ICD-10-CM

## 2024-04-14 ENCOUNTER — Other Ambulatory Visit: Payer: Self-pay | Admitting: Nurse Practitioner

## 2024-04-14 DIAGNOSIS — H6501 Acute serous otitis media, right ear: Secondary | ICD-10-CM | POA: Diagnosis not present

## 2024-04-14 DIAGNOSIS — H6691 Otitis media, unspecified, right ear: Secondary | ICD-10-CM | POA: Diagnosis not present

## 2024-04-14 MED ORDER — SCOPOLAMINE 1 MG/3DAYS TD PT72: 1.0000 | MEDICATED_PATCH | TRANSDERMAL | 0 refills | Status: DC

## 2024-04-16 DIAGNOSIS — R399 Unspecified symptoms and signs involving the genitourinary system: Secondary | ICD-10-CM | POA: Diagnosis not present

## 2024-04-16 DIAGNOSIS — N5201 Erectile dysfunction due to arterial insufficiency: Secondary | ICD-10-CM | POA: Diagnosis not present

## 2024-04-30 ENCOUNTER — Other Ambulatory Visit: Payer: Self-pay | Admitting: Family Medicine

## 2024-05-13 NOTE — Telephone Encounter (Signed)
 Cannot move forward without confirming current medications.   He will also need ov due to ASA 3 status, cirrhosis.   He is overdue for cirrhosis follow up, last seen by [facility] in 10/2023.   Please arrange for ov with Dr. Shaaron or me.

## 2024-05-14 ENCOUNTER — Telehealth: Payer: Self-pay | Admitting: Gastroenterology

## 2024-05-14 NOTE — Telephone Encounter (Signed)
 Will schedule an OV for pt

## 2024-05-14 NOTE — Telephone Encounter (Signed)
 Left patient a message to schedule an appointment with either Dr. Shaaron or Sonny.  Put in the Appointment notes:  confirmation current meds, OV for ASA 3 status cirrhosis, cirrhosis f/u

## 2024-05-14 NOTE — Telephone Encounter (Signed)
 I called the patient and left a message to call the office to schedule.  I put a note to include what he would be coming in for in the appointment notes.

## 2024-05-25 ENCOUNTER — Other Ambulatory Visit: Payer: Self-pay | Admitting: Family Medicine

## 2024-05-25 DIAGNOSIS — G479 Sleep disorder, unspecified: Secondary | ICD-10-CM

## 2024-05-27 ENCOUNTER — Encounter: Payer: Self-pay | Admitting: Family Medicine

## 2024-05-27 ENCOUNTER — Encounter (INDEPENDENT_AMBULATORY_CARE_PROVIDER_SITE_OTHER): Payer: Self-pay

## 2024-05-27 ENCOUNTER — Other Ambulatory Visit: Payer: Self-pay

## 2024-05-27 ENCOUNTER — Ambulatory Visit: Admitting: Family Medicine

## 2024-05-27 VITALS — BP 115/74 | HR 55 | Temp 97.7°F | Ht 66.0 in | Wt 196.0 lb

## 2024-05-27 DIAGNOSIS — E1169 Type 2 diabetes mellitus with other specified complication: Secondary | ICD-10-CM

## 2024-05-27 DIAGNOSIS — G479 Sleep disorder, unspecified: Secondary | ICD-10-CM | POA: Diagnosis not present

## 2024-05-27 DIAGNOSIS — E785 Hyperlipidemia, unspecified: Secondary | ICD-10-CM | POA: Diagnosis not present

## 2024-05-27 DIAGNOSIS — E119 Type 2 diabetes mellitus without complications: Secondary | ICD-10-CM | POA: Diagnosis not present

## 2024-05-27 DIAGNOSIS — H9011 Conductive hearing loss, unilateral, right ear, with unrestricted hearing on the contralateral side: Secondary | ICD-10-CM | POA: Diagnosis not present

## 2024-05-27 DIAGNOSIS — H9190 Unspecified hearing loss, unspecified ear: Secondary | ICD-10-CM

## 2024-05-27 DIAGNOSIS — Z794 Long term (current) use of insulin: Secondary | ICD-10-CM

## 2024-05-27 MED ORDER — PANTOPRAZOLE SODIUM 40 MG PO TBEC: 40.0000 mg | DELAYED_RELEASE_TABLET | Freq: Two times a day (BID) | ORAL | 5 refills | Status: AC

## 2024-05-27 MED ORDER — TRAZODONE HCL 100 MG PO TABS: ORAL_TABLET | ORAL | 1 refills | Status: AC

## 2024-05-27 MED ORDER — NOVOLOG FLEXPEN 100 UNIT/ML ~~LOC~~ SOPN: PEN_INJECTOR | SUBCUTANEOUS | 3 refills | Status: AC

## 2024-05-27 MED ORDER — LANTUS SOLOSTAR 100 UNIT/ML ~~LOC~~ SOPN: PEN_INJECTOR | SUBCUTANEOUS | 6 refills | Status: AC

## 2024-05-27 MED ORDER — NADOLOL 40 MG PO TABS: ORAL_TABLET | ORAL | 3 refills | Status: AC

## 2024-05-27 NOTE — Progress Notes (Addendum)
" ° °  Subjective:    Patient ID: Austin Morgan, male    DOB: November 10, 1955, 68 y.o.   MRN: [id number]  HPI  6 month follow up , DM would like to discuss medications Patient states he does his short acting insulin  as necessary depending on his numbers States his evening insulin  he is moved down to 35 units but some evenings he states if his numbers are relatively low he does not take it at all He denies any low sugar spells He uses blood glucose monitor and it has been looking with good numbers over the past several weeks Has underlying liver disease followed by gastroenterology doing well with that Patient also gets blood work on a regular basis through his specialist but he is due for cholesterol and A1c Does use glucose monitoring.  It does allow him to help maintain his sugars under good control and avoid hypoglycemia.  He is on insulin .  It is medically necessary for him to utilize this. Patient is followed by kidney specialist on a regular basis as well Has stayed away from alcohol Patient had sudden hearing loss on the right side and has now come back to the point where he can hear but not well on the right side Review of Systems     Objective:   Physical Exam  General-in no acute distress Eyes-no discharge Lungs-respiratory rate normal, CTA CV-no murmurs,RRR Extremities skin warm dry no edema Neuro grossly normal Behavior normal, alert       Assessment & Plan:   1. Diabetes mellitus without complication (HCC) (Primary) Seemingly under good control check A1c await results - Hemoglobin A1c - NOVOLOG  FLEXPEN 100 UNIT/ML FlexPen; INJECT 20 UNITS SUBCUTANEOUSLY IN THE MORNING, 10 UNITS WITH LUNCH AND 20 UNITS WITH SUPPER  Dispense: 15 mL; Refill: 3  2. Hyperlipidemia associated with type 2 diabetes mellitus (HCC) Check lipid profile continue current measures goal is to get LDL below 70 and if possible below 55 - Lipid Panel  3. Conductive hearing loss of right ear with  unrestricted hearing of left ear Sudden hearing loss several weeks ago but it is now recovered to the point where it is more muffled hearing  4. Sleep disturbance Uses trazodone  at nighttime - traZODone  (DESYREL ) 100 MG tablet; TAKE 1 & 1/2 (ONE & ONE-HALF) TABLETS BY MOUTH ONCE DAILY AT BEDTIME  Dispense: 135 tablet; Refill: 1  Sees kidney doctor on a regular basis Sees gastroenterology regular basis Follow-up here 6 months We did discuss proper use of long-acting insulin  and trying to find a dose that keeps the morning sugars around 100-130 and avoiding low sugar spells if possible notify us  if any issues "

## 2024-05-28 LAB — LIPID PANEL
Chol/HDL Ratio: 3.3 ratio (ref 0.0–5.0)
Cholesterol, Total: 136 mg/dL (ref 100–199)
HDL: 41 mg/dL (ref >39)
LDL Chol Calc (NIH): 79 mg/dL (ref 0–99)
Triglycerides: 82 mg/dL (ref 0–149)
VLDL Cholesterol Cal: 16 mg/dL (ref 5–40)

## 2024-05-28 LAB — HEMOGLOBIN A1C
Est. average glucose Bld gHb Est-mCnc: 137 mg/dL
Hgb A1c MFr Bld: 6.4 % — ABNORMAL HIGH (ref 4.8–5.6)

## 2024-06-08 ENCOUNTER — Encounter: Payer: Self-pay | Admitting: *Deleted

## 2024-06-17 ENCOUNTER — Encounter: Payer: Self-pay | Admitting: Internal Medicine

## 2024-06-18 ENCOUNTER — Other Ambulatory Visit: Payer: Self-pay | Admitting: Gastroenterology

## 2024-06-18 MED ORDER — RIFAXIMIN 550 MG PO TABS: 550.0000 mg | ORAL_TABLET | Freq: Two times a day (BID) | ORAL | 11 refills | Status: AC

## 2024-06-23 ENCOUNTER — Encounter: Payer: Self-pay | Admitting: Internal Medicine

## 2024-06-23 ENCOUNTER — Ambulatory Visit: Admitting: Internal Medicine

## 2024-06-23 ENCOUNTER — Other Ambulatory Visit: Payer: Self-pay | Admitting: *Deleted

## 2024-06-23 ENCOUNTER — Encounter: Payer: Self-pay | Admitting: *Deleted

## 2024-06-23 VITALS — BP 128/72 | HR 56 | Temp 99.2°F | Ht 66.0 in | Wt 202.4 lb

## 2024-06-23 DIAGNOSIS — Z860101 Personal history of adenomatous and serrated colon polyps: Secondary | ICD-10-CM | POA: Diagnosis not present

## 2024-06-23 DIAGNOSIS — R161 Splenomegaly, not elsewhere classified: Secondary | ICD-10-CM

## 2024-06-23 DIAGNOSIS — K7030 Alcoholic cirrhosis of liver without ascites: Secondary | ICD-10-CM | POA: Diagnosis not present

## 2024-06-23 DIAGNOSIS — F1091 Alcohol use, unspecified, in remission: Secondary | ICD-10-CM | POA: Diagnosis not present

## 2024-06-23 DIAGNOSIS — K219 Gastro-esophageal reflux disease without esophagitis: Secondary | ICD-10-CM

## 2024-06-23 MED ORDER — PEG 3350-KCL-NA BICARB-NACL 420 G PO SOLR: 4000.0000 mL | Freq: Once | ORAL | 0 refills | Status: AC

## 2024-06-23 NOTE — Progress Notes (Signed)
 "   Gastroenterology Progress Note    Primary Care Physician:  Alphonsa Glendia LABOR, MD Primary Gastroenterologist:  Dr. Shaaron  Pre-Procedure History & Physical: HPI:  Austin Morgan is a 69 y.o. male here for for follow-up and consideration of surveillance colonoscopy.  Patient of multiple colonic adenomas (greater than 10) removed 2024 he was told to return in a year for repeat examination.  Has not had any bowel symptoms.  Has a new grandson-his first.  He decided to stop drinking no alcohol in 2 years.  He has done well.  Well compensated cirrhosis.  History of grade 2-3 varices seen previously treated with nonselective beta-blockade follow-up endoscopy 2 years ago demonstrated barely discernible grade 1 varices.  History of ascites which is resolved.  Followed by Dr. Rachele.  GERD well-controlled on Protonix  40 mg daily.  Past Medical History:  Diagnosis Date   Anxiety    Arthritis    Ascites 04/15/2023   Patient with radiologic evidence of ascites and shifting dullness on exam.     Will plan for four-quadrant ultrasound to scan for ascites.     Patient will continue on his current diuretic dosage until results are obtained from ultrasound to determine need for paracentesis or increased diuretics.     Reviewed the need for low-sodium diet.     Reviewed signs and symptoms of SBP and indications to c   CHF (congestive heart failure) (HCC)    Cirrhosis (HCC)    Depression    Diabetes mellitus without complication (HCC)    ED (erectile dysfunction)    Gout    H/O ETOH abuse    in remission   Hyperlipidemia    Hypertension    Hypertriglyceridemia    Iron  deficiency anemia due to chronic blood loss 12/20/2021   PONV (postoperative nausea and vomiting)    Sleep apnea     Past Surgical History:  Procedure Laterality Date   CARPAL TUNNEL RELEASE Bilateral    COLONOSCOPY  2010   Dr. Princella Nida: mild diverticulosis, next colonoscopy 2020   COLONOSCOPY WITH PROPOFOL  N/A 01/21/2020    Jeaninne Lodico: 9 polyps removed, multiple tubular adenomas.  Next colonoscopy in 3 years.   COLONOSCOPY WITH PROPOFOL  N/A 04/03/2023   Procedure: COLONOSCOPY WITH PROPOFOL ;  Surgeon: Shaaron Lamar HERO, MD;  [facility];  Service: Endoscopy;  Laterality: N/A;  945AM, ASA 3   ELBOW SURGERY     ESOPHAGEAL BANDING  09/28/2021   Procedure: ESOPHAGEAL BANDING;  Surgeon: Shaaron Lamar HERO, MD;  [facility]: [facility];  Service: Endoscopy;;   ESOPHAGOGASTRODUODENOSCOPY (EGD) WITH PROPOFOL  N/A 01/21/2020   Alann Avey: normal   ESOPHAGOGASTRODUODENOSCOPY (EGD) WITH PROPOFOL  N/A 09/28/2021   Procedure: ESOPHAGOGASTRODUODENOSCOPY (EGD) WITH PROPOFOL ;  Surgeon: Shaaron Lamar HERO, MD;  [facility];  Service: Endoscopy;  Laterality: N/A;  3:00pm   ESOPHAGOGASTRODUODENOSCOPY (EGD) WITH PROPOFOL  N/A 10/31/2021   Procedure: ESOPHAGOGASTRODUODENOSCOPY (EGD) WITH PROPOFOL ;  Surgeon: Eartha Angelia Sieving, MD;  [facility]: [facility];  Service: Gastroenterology;  Laterality: N/A;   ESOPHAGOGASTRODUODENOSCOPY (EGD) WITH PROPOFOL  N/A 05/23/2022   Procedure: ESOPHAGOGASTRODUODENOSCOPY (EGD) WITH PROPOFOL ;  Surgeon: Shaaron Lamar HERO, MD;  [facility];  Service: Endoscopy;  Laterality: N/A;  8:30am, asa 3   NECK SURGERY     c4   POLYPECTOMY  01/21/2020   Procedure: POLYPECTOMY;  Surgeon: Shaaron Lamar HERO, MD;  [facility];  Service: Endoscopy;;  colon   POLYPECTOMY  04/03/2023   Procedure: POLYPECTOMY;  Surgeon: Shaaron Lamar  M, MD;  [facility];  Service: Endoscopy;;    Prior to Admission medications  Medication Sig Start Date End Date Taking? Authorizing Provider  albuterol  (VENTOLIN  HFA) 108 (90 Base) MCG/ACT inhaler INHALE 2 PUFFS BY MOUTH EVERY 6 HOURS AS NEEDED FOR WHEEZING 06/07/22  Yes Luking, Scott A, MD  augmented betamethasone dipropionate (DIPROLENE-AF) 0.05 % cream Apply topically 2 (two) times daily as needed. 12/03/22  Yes [provider]  BD PEN  NEEDLE NANO 2ND GEN 32G X 4 MM MISC USE TO ADMINISTER INSULIN  07/25/22  Yes [provider]  Continuous Blood Gluc Receiver (DEXCOM G6 RECEIVER) DEVI 1 Device by Does not apply route continuous. 01/05/22  Yes Luking, Glendia LABOR, MD  Continuous Blood Gluc Sensor (DEXCOM G6 SENSOR) MISC 1 Units by Does not apply route continuous. 01/05/22  Yes Alphonsa Glendia LABOR, MD  diclofenac Sodium (VOLTAREN) 1 % GEL Apply 2 g topically in the morning, at noon, and at bedtime.   Yes [provider]  ferrous sulfate 325 (65 FE) MG EC tablet Take 325 mg by mouth every morning. Patient taking differently: Take 325 mg by mouth daily as needed. 11/27/21  Yes [provider]  furosemide  (LASIX ) 40 MG tablet Take 1 tablet (40 mg total) by mouth 2 (two) times daily. 02/27/22 06/23/24 Yes Shahmehdi, Adriana LABOR, MD  LANTUS  SOLOSTAR 100 UNIT/ML Solostar Pen INJECT 35 UNITS SUBCUTANEOUSLY ONCE DAILY IN THE EVENING. MAY TITRATE UP TO 50 UNITS 05/27/24  Yes Luking, Glendia LABOR, MD  Multiple Vitamins-Minerals (MULTIVITAMIN WITH MINERALS) tablet Take 1 tablet by mouth daily.   Yes [provider]  nadolol  (CORGARD ) 40 MG tablet Take 1/2 (one-half) tablet by mouth once daily 05/27/24  Yes Luking, Scott A, MD  NOVOLOG  FLEXPEN 100 UNIT/ML FlexPen INJECT 20 UNITS SUBCUTANEOUSLY IN THE MORNING, 10 UNITS WITH LUNCH AND 20 UNITS WITH SUPPER 05/27/24  Yes Luking, Scott A, MD  ondansetron  (ZOFRAN ) 8 MG tablet TAKE 1 TABLET BY MOUTH THREE TIMES DAILY AS NEEDED FOR NAUSEA 12/15/21  Yes Luking, Glendia LABOR, MD  pantoprazole  (PROTONIX ) 40 MG tablet Take 1 tablet (40 mg total) by mouth 2 (two) times daily. Patient taking differently: Take 40 mg by mouth daily. 05/27/24  Yes Alphonsa Glendia LABOR, MD  pramipexole  (MIRAPEX ) 0.25 MG tablet Take 1 tablet (0.25 mg total) by mouth at bedtime. 11/26/23  Yes Luking, Glendia LABOR, MD  rifaximin  (XIFAXAN ) 550 MG TABS tablet Take 1 tablet (550 mg total) by mouth 2 (two) times daily. 06/18/24  Yes Ezzard Sonny RAMAN, PA-C  sildenafil  (VIAGRA ) 100 MG tablet Take 0.5-1 tablets (50-100 mg total) by mouth daily as needed for erectile dysfunction. 11/26/23  Yes Alphonsa Glendia LABOR, MD  spironolactone  (ALDACTONE ) 25 MG tablet Take 25 mg by mouth every Monday, Wednesday, and Friday. 11/27/21  Yes [provider]  traZODone  (DESYREL ) 100 MG tablet TAKE 1 & 1/2 (ONE & ONE-HALF) TABLETS BY MOUTH ONCE DAILY AT BEDTIME 05/27/24  Yes Luking, Glendia LABOR, MD  triamcinolone  cream (KENALOG ) 0.1 % Apply thin amount bid prn 07/23/22  Yes Alphonsa Glendia LABOR, MD    Allergies as of 06/23/2024 - Review Complete 06/23/2024  Allergen Reaction Noted   Codeine Nausea And Vomiting 11/26/2008    Family History  Problem Relation Age of Onset   Heart attack Father    Heart Problems Brother        Had open heart surgery   Colon cancer Neg Hx     Social History  Socioeconomic History   Marital status: Married    Spouse name: Not on file   Number of children: Not on file   Years of education: Not on file   Highest education level: Not on file  Occupational History   Not on file  Tobacco Use   Smoking status: Former    Current packs/day: 0.00    Average packs/day: 1 pack/day for 36.0 years (36.0 ttl pk-yrs)    Types: Cigarettes    Start date: 03/11/1980    Quit date: 03/11/2016    Years since quitting: 8.2    Passive exposure: Past   Smokeless tobacco: Never  Vaping Use   Vaping status: Never Used  Substance and Sexual Activity   Alcohol use: Not Currently    Comment: drinks a fifth of liquor per week; inpatient rehab 08/2019. denied 10/14/19. No etoh since 03/2021 (confirmed 02/12/23)   Drug use: No   Sexual activity: Yes  Other Topics Concern   Not on file  Social History Narrative   Wife, Quamere Mussell NP Palliative care of [facility]   Social Drivers of Health   Tobacco Use: Medium Risk (06/23/2024)   Patient History    Smoking Tobacco Use: Former    Smokeless Tobacco Use: Never    Passive Exposure: Past   Physicist, Medical Strain: Low Risk (08/24/2022)   Overall Financial Resource Strain (CARDIA)    Difficulty of Paying Living Expenses: Not hard at all  Food Insecurity: Low Risk (10/14/2023)   Received from [facility]   Epic    Within the past 12 months, you worried that your food would run out before you got money to buy more: Never true    Within the past 12 months, the food you bought just didn't last and you didn't have money to get more. : Never true  Transportation Needs: No Transportation Needs (10/14/2023)   Received from [facility]    In the past 12 months, has lack of reliable transportation kept you from medical appointments, meetings, work or from getting things needed for daily living? : No  Physical Activity: Inactive (08/24/2022)   Exercise Vital Sign    Days of Exercise per Week: 0 days    Minutes of Exercise per Session: 0 min  Stress: No Stress Concern Present (08/24/2022)   [employer] of Occupational Health - Occupational Stress Questionnaire    Feeling of Stress : Not at all  Social Connections: Socially Integrated (08/24/2022)   Social Connection and Isolation Panel    Frequency of Communication with Friends and Family: More than three times a week    Frequency of Social Gatherings with Friends and Family: More than three times a week    Attends Religious Services: More than 4 times per year    Active Member of [employer] or Organizations: Yes    Attends Banker Meetings: More than 4 times per year    Marital Status: Married  Catering Manager Violence: Not At Risk (08/24/2022)   Humiliation, Afraid, Rape, and Kick questionnaire    Fear of Current or Ex-Partner: No    Emotionally Abused: No    Physically Abused: No    Sexually Abused: No  Depression (PHQ2-9): Low Risk (12/05/2023)   Depression (PHQ2-9)    PHQ-2 Score: 0  Alcohol Screen: Low Risk (08/24/2022)   Alcohol Screen    Last Alcohol Screening Score (AUDIT): 0  Housing:  Low Risk (10/14/2023)   Received from [facility]  What is your living situation today?: I have a steady place to live    Think about the place you live. Do you have problems with any of the following? Choose all that apply:: None/None on this list  Utilities: Low Risk (10/14/2023)   Received from [facility]   Utilities    In the past 12 months has the electric, gas, oil, or water  company threatened to shut off services in your home? : No  Health Literacy: Not on file    Review of Systems   See HPI, otherwise negative ROS  Physical Exam: BP 128/72   Pulse (!) 56   Temp 99.2 F (37.3 C) (Oral)   Ht 5' 6 (1.676 m)   Wt 202 lb 6.4 oz (91.8 kg)   SpO2 99%   BMI 32.67 kg/m  General:   Alert,  Well-developed, well-nourished, pleasant and cooperative in NAD Neck:  Supple; no masses or thyromegaly. No significant cervical adenopathy. Lungs:  Clear throughout to auscultation.   No wheezes, crackles, or rhonchi. No acute distress. Heart:  Regular rate and rhythm; no murmurs, clicks, rubs,  or gallops. Abdomen: Protuberant.  No obvious shifting dullness or fluid.  He does have a palpable spleen pulses:  Normal pulses noted. Extremities:  Without clubbing or edema.   Impression/Plan:    69 year old gentleman with compensated EtOH EtOH use related cirrhosis.  He does have evidence of portal hypertension.  Previously decompensated no alcohol in 2 years which has been very helpful he was commended.  Barely discernible esophageal varices previously high burden colonic adenomas removed 2024; due for surveillance colonoscopy now in fact, he ought to have his upper GI tract looked at at the same time.  I have offered the patient a surveillance EGD and colonoscopy in the near future.  Risk benefits limitations have been reviewed.  Questions answered ASA 3-room 1 okay.    Notice: This dictation was prepared with Dragon dictation along with smaller phrase technology. Any  transcriptional errors that result from this process are unintentional and may not be corrected upon review.  "

## 2024-06-23 NOTE — Patient Instructions (Addendum)
 Nice to see you again today  As discussed, we will proceed with scheduling a surveillance colonoscopy (history of multiple colonic polyps removed 2024).  ASA 3/room 1 okay.  History of esophageal varices (which looked actually better at the last time we looked).  We need to relook in your stomach and esophagus at this time.  As discussed, plan to perform both an EGD and a colonoscopy in the near future.  Continue Protonix  40 mg daily

## 2024-06-24 ENCOUNTER — Telehealth: Payer: Self-pay | Admitting: *Deleted

## 2024-06-24 ENCOUNTER — Encounter: Payer: Self-pay | Admitting: *Deleted

## 2024-06-24 NOTE — Telephone Encounter (Signed)
 Pt called and says he needed to reschedule procedure because he couldn't do the date of 07/08/24. He has been rescheduled for 07/29/24. Updated instructions sent via mychart.

## 2024-07-03 ENCOUNTER — Other Ambulatory Visit (HOSPITAL_COMMUNITY)

## 2024-07-06 ENCOUNTER — Institutional Professional Consult (permissible substitution) (INDEPENDENT_AMBULATORY_CARE_PROVIDER_SITE_OTHER): Admitting: Otolaryngology

## 2024-07-20 ENCOUNTER — Institutional Professional Consult (permissible substitution) (INDEPENDENT_AMBULATORY_CARE_PROVIDER_SITE_OTHER): Admitting: Physician Assistant

## 2024-07-27 ENCOUNTER — Encounter (HOSPITAL_COMMUNITY)

## 2024-07-29 ENCOUNTER — Encounter (HOSPITAL_COMMUNITY): Payer: Self-pay

## 2024-07-29 ENCOUNTER — Ambulatory Visit (HOSPITAL_COMMUNITY): Admit: 2024-07-29 | Admitting: Internal Medicine

## 2024-07-29 SURGERY — COLONOSCOPY
Anesthesia: Choice

## 2024-09-04 ENCOUNTER — Ambulatory Visit

## 2024-10-09 ENCOUNTER — Inpatient Hospital Stay

## 2024-10-16 ENCOUNTER — Inpatient Hospital Stay: Admitting: Oncology

## 2024-11-25 ENCOUNTER — Ambulatory Visit: Admitting: Family Medicine
